# Patient Record
Sex: Female | Born: 1945 | Race: Black or African American | Hispanic: No | Smoking: Former smoker
Health system: Southern US, Community
[De-identification: ages and names within clinical notes are randomized; demographics above are authoritative.]

## PROBLEM LIST (undated history)

## (undated) DIAGNOSIS — Z9289 Personal history of other medical treatment: Secondary | ICD-10-CM

## (undated) DIAGNOSIS — I10 Essential (primary) hypertension: Secondary | ICD-10-CM

## (undated) DIAGNOSIS — Z860100 Personal history of colon polyps, unspecified: Secondary | ICD-10-CM

## (undated) DIAGNOSIS — E7800 Pure hypercholesterolemia, unspecified: Secondary | ICD-10-CM

## (undated) DIAGNOSIS — Z5189 Encounter for other specified aftercare: Secondary | ICD-10-CM

## (undated) DIAGNOSIS — N200 Calculus of kidney: Secondary | ICD-10-CM

## (undated) DIAGNOSIS — H269 Unspecified cataract: Secondary | ICD-10-CM

## (undated) DIAGNOSIS — T8859XA Other complications of anesthesia, initial encounter: Secondary | ICD-10-CM

## (undated) DIAGNOSIS — I499 Cardiac arrhythmia, unspecified: Secondary | ICD-10-CM

## (undated) DIAGNOSIS — K269 Duodenal ulcer, unspecified as acute or chronic, without hemorrhage or perforation: Secondary | ICD-10-CM

## (undated) DIAGNOSIS — R011 Cardiac murmur, unspecified: Secondary | ICD-10-CM

## (undated) DIAGNOSIS — M810 Age-related osteoporosis without current pathological fracture: Secondary | ICD-10-CM

## (undated) DIAGNOSIS — Z87442 Personal history of urinary calculi: Secondary | ICD-10-CM

## (undated) DIAGNOSIS — K263 Acute duodenal ulcer without hemorrhage or perforation: Secondary | ICD-10-CM

## (undated) DIAGNOSIS — K921 Melena: Secondary | ICD-10-CM

## (undated) DIAGNOSIS — G5600 Carpal tunnel syndrome, unspecified upper limb: Secondary | ICD-10-CM

## (undated) HISTORY — DX: Unspecified cataract: H26.9

## (undated) HISTORY — DX: Encounter for other specified aftercare: Z51.89

## (undated) HISTORY — DX: Melena: K92.1

## (undated) HISTORY — DX: Cardiac arrhythmia, unspecified: I49.9

## (undated) HISTORY — DX: Personal history of colonic polyps: Z86.010

## (undated) HISTORY — DX: Carpal tunnel syndrome, unspecified upper limb: G56.00

## (undated) HISTORY — DX: Personal history of other medical treatment: Z92.89

## (undated) HISTORY — DX: Age-related osteoporosis without current pathological fracture: M81.0

## (undated) HISTORY — DX: Personal history of colon polyps, unspecified: Z86.0100

## (undated) HISTORY — DX: Calculus of kidney: N20.0

## (undated) HISTORY — DX: Cardiac murmur, unspecified: R01.1

## (undated) HISTORY — PX: LITHOTRIPSY: SUR834

---

## 1970-01-18 DIAGNOSIS — Z5189 Encounter for other specified aftercare: Secondary | ICD-10-CM

## 1970-01-18 HISTORY — DX: Encounter for other specified aftercare: Z51.89

## 1970-01-18 HISTORY — PX: ABDOMINAL HYSTERECTOMY: SHX81

## 2008-01-19 DIAGNOSIS — M810 Age-related osteoporosis without current pathological fracture: Secondary | ICD-10-CM

## 2008-01-19 HISTORY — DX: Age-related osteoporosis without current pathological fracture: M81.0

## 2010-01-18 DIAGNOSIS — Z9289 Personal history of other medical treatment: Secondary | ICD-10-CM

## 2010-01-18 HISTORY — DX: Personal history of other medical treatment: Z92.89

## 2011-07-06 DIAGNOSIS — Z79899 Other long term (current) drug therapy: Secondary | ICD-10-CM | POA: Diagnosis not present

## 2011-07-06 DIAGNOSIS — I10 Essential (primary) hypertension: Secondary | ICD-10-CM | POA: Diagnosis not present

## 2011-07-06 DIAGNOSIS — M810 Age-related osteoporosis without current pathological fracture: Secondary | ICD-10-CM | POA: Diagnosis not present

## 2011-07-06 DIAGNOSIS — E785 Hyperlipidemia, unspecified: Secondary | ICD-10-CM | POA: Diagnosis not present

## 2011-07-06 DIAGNOSIS — G5600 Carpal tunnel syndrome, unspecified upper limb: Secondary | ICD-10-CM | POA: Diagnosis not present

## 2011-07-06 DIAGNOSIS — Z0000 Encounter for general adult medical examination without abnormal findings: Secondary | ICD-10-CM | POA: Diagnosis not present

## 2011-07-26 DIAGNOSIS — D485 Neoplasm of uncertain behavior of skin: Secondary | ICD-10-CM | POA: Diagnosis not present

## 2012-04-05 ENCOUNTER — Encounter (HOSPITAL_BASED_OUTPATIENT_CLINIC_OR_DEPARTMENT_OTHER): Payer: Self-pay | Admitting: *Deleted

## 2012-04-05 ENCOUNTER — Emergency Department (HOSPITAL_BASED_OUTPATIENT_CLINIC_OR_DEPARTMENT_OTHER)
Admission: EM | Admit: 2012-04-05 | Discharge: 2012-04-05 | Disposition: A | Discharge status: Home / Self Care | Payer: Medicare Other | Attending: Emergency Medicine | Admitting: Emergency Medicine

## 2012-04-05 ENCOUNTER — Emergency Department (HOSPITAL_BASED_OUTPATIENT_CLINIC_OR_DEPARTMENT_OTHER): Payer: Medicare Other

## 2012-04-05 DIAGNOSIS — E7800 Pure hypercholesterolemia, unspecified: Secondary | ICD-10-CM | POA: Insufficient documentation

## 2012-04-05 DIAGNOSIS — Z79899 Other long term (current) drug therapy: Secondary | ICD-10-CM | POA: Diagnosis not present

## 2012-04-05 DIAGNOSIS — D72829 Elevated white blood cell count, unspecified: Secondary | ICD-10-CM

## 2012-04-05 DIAGNOSIS — R002 Palpitations: Secondary | ICD-10-CM

## 2012-04-05 DIAGNOSIS — Z7982 Long term (current) use of aspirin: Secondary | ICD-10-CM | POA: Insufficient documentation

## 2012-04-05 DIAGNOSIS — I10 Essential (primary) hypertension: Secondary | ICD-10-CM | POA: Insufficient documentation

## 2012-04-05 HISTORY — DX: Pure hypercholesterolemia, unspecified: E78.00

## 2012-04-05 HISTORY — DX: Essential (primary) hypertension: I10

## 2012-04-05 LAB — CBC WITH DIFFERENTIAL/PLATELET
Basophils Absolute: 0 10*3/uL (ref 0.0–0.1)
Basophils Relative: 0 % (ref 0–1)
Eosinophils Absolute: 0.2 10*3/uL (ref 0.0–0.7)
Hemoglobin: 14.2 g/dL (ref 12.0–15.0)
MCH: 29.5 pg (ref 26.0–34.0)
MCHC: 34 g/dL (ref 30.0–36.0)
Monocytes Relative: 6 % (ref 3–12)
Neutro Abs: 7 10*3/uL (ref 1.7–7.7)
Neutrophils Relative %: 58 % (ref 43–77)
Platelets: 272 10*3/uL (ref 150–400)
RDW: 13.9 % (ref 11.5–15.5)

## 2012-04-05 LAB — COMPREHENSIVE METABOLIC PANEL
ALT: 13 U/L (ref 0–35)
AST: 13 U/L (ref 0–37)
Albumin: 3.9 g/dL (ref 3.5–5.2)
Alkaline Phosphatase: 86 U/L (ref 39–117)
BUN: 18 mg/dL (ref 6–23)
Chloride: 101 mEq/L (ref 96–112)
Potassium: 3.8 mEq/L (ref 3.5–5.1)
Sodium: 142 mEq/L (ref 135–145)
Total Bilirubin: 0.2 mg/dL — ABNORMAL LOW (ref 0.3–1.2)
Total Protein: 7.5 g/dL (ref 6.0–8.3)

## 2012-04-05 LAB — LIPASE, BLOOD: Lipase: 35 U/L (ref 11–59)

## 2012-04-05 NOTE — ED Notes (Signed)
C/o palpitations since yesterday, denies CP. Pt states that the palpitations have been constant but denies SOB.

## 2012-04-05 NOTE — ED Provider Notes (Signed)
History     CSN: [id number]  Arrival date & time 04/05/12  1941   First MD Initiated Contact with Patient 04/05/12 2004      Chief Complaint  Patient presents with  . Palpitations    HPI  The patient presents with palpitations.  This episode began yesterday.  She notes similar prior episodes in the distant past, that resulted in cardiology evaluation with Holter monitoring.  She has no ecchymosis of dysrhythmia, nor MI.  She also has no evidence of thromboembolic disease. Since this episode began yesterday she's had mild anterior chest fluttering sensation without concurrent chest pain, dyspnea, lightheadedness, syncope, vomiting, weakness or any other focal complaints.  No clear alleviating or exacerbating factors.   Past Medical History  Diagnosis Date  . Hypertension   . High cholesterol     Past Surgical History  Procedure Laterality Date  . Abdominal hysterectomy      History reviewed. No pertinent family history.  History  Substance Use Topics  . Smoking status: Never Smoker   . Smokeless tobacco: Not on file  . Alcohol Use: No    OB History   Grav Para Term Preterm Abortions TAB SAB Ect Mult Living                  Review of Systems  Constitutional:       Per HPI, otherwise negative  HENT:       Per HPI, otherwise negative  Respiratory:       Per HPI, otherwise negative  Cardiovascular:       Per HPI, otherwise negative  Gastrointestinal: Negative for vomiting.  Endocrine:       Negative aside from HPI  Genitourinary:       Neg aside from HPI   Musculoskeletal:       Per HPI, otherwise negative  Skin: Negative.   Neurological: Negative for syncope.    Allergies  Review of patient's allergies indicates no known allergies.  Home Medications   Current Outpatient Rx  Name  Route  Sig  Dispense  Refill  . aspirin 81 MG tablet   Oral   Take 81 mg by mouth daily.         . cholecalciferol (VITAMIN D) 1000 UNITS tablet   Oral   Take 1,000  Units by mouth daily.         Marland Kitchen lisinopril (PRINIVIL,ZESTRIL) 5 MG tablet   Oral   Take 5 mg by mouth daily.         . simvastatin (ZOCOR) 5 MG tablet   Oral   Take 5 mg by mouth at bedtime.           BP 129/72  Pulse 87  Temp(Src) 98.9 F (37.2 C) (Oral)  Resp 20  Ht 4\' 11"  (1.499 m)  Wt 175 lb (79.379 kg)  BMI 35.33 kg/m2  SpO2 100%  Physical Exam  Nursing note and vitals reviewed. Constitutional: She is oriented to person, place, and time. She appears well-developed and well-nourished. No distress.  HENT:  Head: Normocephalic and atraumatic.  Eyes: Conjunctivae and EOM are normal.  Cardiovascular: Normal rate and regular rhythm.   Pulmonary/Chest: Effort normal and breath sounds normal. No stridor. No respiratory distress.  Abdominal: She exhibits no distension.  Musculoskeletal: She exhibits no edema.  Neurological: She is alert and oriented to person, place, and time. No cranial nerve deficit.  Skin: Skin is warm and dry.  Psychiatric: She has a normal mood and affect.  ED Course  Procedures (including critical care time)  Labs Reviewed  CBC WITH DIFFERENTIAL - Abnormal; Notable for the following:    WBC 12.1 (*)    Lymphs Abs 4.1 (*)    All other components within normal limits  COMPREHENSIVE METABOLIC PANEL  LIPASE, BLOOD  TROPONIN I   No results found.   No diagnosis found.   Cardiac 70 sinus rhythm normal O2-  99% room air normal   Date: 04/05/2012  Rate: 73  Rhythm: normal sinus rhythm  QRS Axis: left  Intervals: normal  ST/T Wave abnormalities: normal  Conduction Disutrbances:none  Narrative Interpretation:   Old EKG Reviewed: none available Anterior q- waves - borderline  10:28 PM Patient sitting upright smiling, speaking with her family.  She and her family were informed of all results, including leukocytosis, mild hypercalcemia.  The patient denies any ongoing complaints. MDM  This pleasant elderly female presents with  concern palpitations, painless, without dyspnea.  On exam she is in no distress, with no hypoxia, tachycardia, tachypnea.  Given the absence of pain, distress, the suspicion for atypical ACS.  There is mild leukocytosis, mild hypercalcemia, but absent distress, with largely reassuring labs, ECG, x-ray, the patient was appropriate for discharge with next a followup.  She assured me that she has a primary care physician with whom she will speak tomorrow.       Gerhard Munch, MD 04/05/12 2229

## 2012-04-05 NOTE — ED Notes (Signed)
States that her heart is fluttering. Denied pain, dyspnea, diaphoresis, N & V.

## 2012-04-06 DIAGNOSIS — R002 Palpitations: Secondary | ICD-10-CM | POA: Diagnosis not present

## 2012-04-28 DIAGNOSIS — R002 Palpitations: Secondary | ICD-10-CM | POA: Diagnosis not present

## 2012-08-08 ENCOUNTER — Encounter: Payer: Self-pay | Admitting: Family Medicine

## 2012-08-08 ENCOUNTER — Ambulatory Visit (INDEPENDENT_AMBULATORY_CARE_PROVIDER_SITE_OTHER): Payer: Medicare Other | Admitting: Family Medicine

## 2012-08-08 VITALS — BP 116/74 | HR 92 | Temp 98.1°F | Ht <= 58 in | Wt 173.5 lb

## 2012-08-08 DIAGNOSIS — I10 Essential (primary) hypertension: Secondary | ICD-10-CM | POA: Diagnosis not present

## 2012-08-08 DIAGNOSIS — E785 Hyperlipidemia, unspecified: Secondary | ICD-10-CM | POA: Diagnosis not present

## 2012-08-08 MED ORDER — LISINOPRIL-HYDROCHLOROTHIAZIDE 10-12.5 MG PO TABS: 1.0000 | ORAL_TABLET | Freq: Every day | ORAL | Status: DC

## 2012-08-08 NOTE — Patient Instructions (Addendum)
Schedule a lab visit on the way out.  We'll contact you with your lab report. We'll request your old records.  Don't change your meds.  I would get a flu shot each fall.

## 2012-08-09 ENCOUNTER — Encounter: Payer: Self-pay | Admitting: Family Medicine

## 2012-08-09 DIAGNOSIS — I10 Essential (primary) hypertension: Secondary | ICD-10-CM | POA: Insufficient documentation

## 2012-08-09 DIAGNOSIS — E785 Hyperlipidemia, unspecified: Secondary | ICD-10-CM | POA: Insufficient documentation

## 2012-08-09 NOTE — Assessment & Plan Note (Signed)
Continue meds, return for labs.  Requesting records.  

## 2012-08-09 NOTE — Progress Notes (Signed)
New patient.  Requesting records.    Hypertension:    Using medication without problems or lightheadedness: y Chest pain with exertion:n Edema:n Short of breath:n  Elevated Cholesterol: Using medications without problems:y Muscle aches: not other than occ nocturnal calf cramps, discussed stretching Diet compliance:discussed Exercise: discussed  Meds, vitals, and allergies reviewed.   PMH and SH reviewed  ROS: See HPI.  Otherwise negative.    GEN: nad, alert and oriented HEENT: mucous membranes moist NECK: supple w/o LA CV: rrr. PULM: ctab, no inc wob ABD: soft, +bs EXT: no edema SKIN: no acute rash

## 2012-08-09 NOTE — Assessment & Plan Note (Signed)
Continue meds, return for labs.  Requesting records.

## 2012-08-18 ENCOUNTER — Encounter: Payer: Self-pay | Admitting: Family Medicine

## 2012-08-18 ENCOUNTER — Telehealth: Payer: Self-pay | Admitting: Family Medicine

## 2012-08-18 DIAGNOSIS — M810 Age-related osteoporosis without current pathological fracture: Secondary | ICD-10-CM | POA: Insufficient documentation

## 2012-08-18 NOTE — Telephone Encounter (Signed)
Left message on answering machine at home and cell phone to call back.

## 2012-08-18 NOTE — Telephone Encounter (Signed)
Call pt.  Old records reviewed.   She appears to be due for mammogram and possibly repeat colonoscopy.  If she needs referrals, let me know.  Otherwise would get the prev ordered labs soon.  I would get a flu shot each fall.   Schedule CPE for spring 2015.  Thanks.

## 2012-08-21 ENCOUNTER — Other Ambulatory Visit (INDEPENDENT_AMBULATORY_CARE_PROVIDER_SITE_OTHER): Payer: Medicare Other

## 2012-08-21 DIAGNOSIS — I10 Essential (primary) hypertension: Secondary | ICD-10-CM | POA: Diagnosis not present

## 2012-08-21 LAB — BASIC METABOLIC PANEL
CO2: 26 mEq/L (ref 19–32)
Calcium: 9.1 mg/dL (ref 8.4–10.5)
Creatinine, Ser: 0.7 mg/dL (ref 0.4–1.2)
GFR: 112.62 mL/min (ref >60.00)
Glucose, Bld: 107 mg/dL — ABNORMAL HIGH (ref 70–99)
Sodium: 142 mEq/L (ref 135–145)

## 2012-08-21 LAB — LIPID PANEL
HDL: 44.1 mg/dL (ref >39.00)
Total CHOL/HDL Ratio: 4
Triglycerides: 128 mg/dL (ref 0.0–149.0)
VLDL: 25.6 mg/dL (ref 0.0–40.0)

## 2012-08-21 NOTE — Telephone Encounter (Signed)
Patient advised.   She will check on scheduling her mammogram and colonoscopy and will let us know if she needs referrals.  She had labs done today.  Will schedule CPE for Spring 2015.

## 2012-08-22 ENCOUNTER — Encounter: Payer: Self-pay | Admitting: Family Medicine

## 2012-08-22 LAB — TSH
ALT: 10 U/L (ref 7–35)
AST: 12 U/L
BUN: 12 mg/dL (ref 4–21)
Cholesterol: 200 mg/dL (ref 0–200)
Glucose: 88
Hemoglobin: 13.4 g/dL (ref 11.8–15.5)

## 2012-08-23 ENCOUNTER — Other Ambulatory Visit: Payer: Medicare Other

## 2012-08-29 DIAGNOSIS — H10029 Other mucopurulent conjunctivitis, unspecified eye: Secondary | ICD-10-CM | POA: Diagnosis not present

## 2012-08-31 ENCOUNTER — Encounter: Payer: Self-pay | Admitting: Family Medicine

## 2012-08-31 ENCOUNTER — Ambulatory Visit (INDEPENDENT_AMBULATORY_CARE_PROVIDER_SITE_OTHER): Payer: Medicare Other | Admitting: Family Medicine

## 2012-08-31 VITALS — BP 132/68 | HR 89 | Temp 98.2°F | Wt 174.0 lb

## 2012-08-31 DIAGNOSIS — M79609 Pain in unspecified limb: Secondary | ICD-10-CM | POA: Diagnosis not present

## 2012-08-31 DIAGNOSIS — M79605 Pain in left leg: Secondary | ICD-10-CM

## 2012-08-31 DIAGNOSIS — M25569 Pain in unspecified knee: Secondary | ICD-10-CM | POA: Insufficient documentation

## 2012-08-31 MED ORDER — LISINOPRIL-HYDROCHLOROTHIAZIDE 10-12.5 MG PO TABS: 1.0000 | ORAL_TABLET | Freq: Every day | ORAL | Status: DC

## 2012-08-31 NOTE — Assessment & Plan Note (Signed)
Likely hamstring strain that is positional.  Would change sitting position and use a knee sleeve.  Fu prn. No sign of dvt.

## 2012-08-31 NOTE — Progress Notes (Signed)
No back pain but L posterior leg pain, near the distal hamstring.  No calf pain.  Worse recently, no trauma.  No rash.  No knee locking. She often sits with her knee flexed and her L calf under her R thigh, ie L leg pinned under R leg.   Meds, vitals, and allergies reviewed.   ROS: See HPI.  Otherwise, noncontributory.  nad ncat Back w/o midline pain SLR neg but hamstring pain reproduced on stretching.  No rash No bands in the legs, no calf circ discrepancy.  Distally nv intact Pain reproduced with a L leg lunge.

## 2012-08-31 NOTE — Patient Instructions (Addendum)
Stretch the back of your leg and use a knee sleeve.  Don't pin your leg back when you are sitting down.  Take care.

## 2013-01-04 ENCOUNTER — Telehealth: Payer: Self-pay | Admitting: Family Medicine

## 2013-01-04 NOTE — Telephone Encounter (Signed)
Left detailed message on voicemail.  

## 2013-01-04 NOTE — Telephone Encounter (Signed)
She could try mucinex tonight and then keep the appointment tomorrow.  It looks like she is scheduled with Dr. Felicity Coyer.

## 2013-01-04 NOTE — Telephone Encounter (Signed)
Patient Information:  Caller Name: Adriahna  Phone: [phone]  Patient: Patricia Hoover, Patricia Hoover  Gender: Female  DOB: January 09, 1946  Age: 67 Years  PCP: Crawford Givens Clelia Croft) [facility])  Office Follow Up:  Does the office need to follow up with this patient?: No  Instructions For The Office: N/A  RN Note:  Onset of cough after cold symptoms 01/01/13.  States cough has gotten worse, and wants to know next steps, or if she can take OTC cough medication.  States constant cough is disruptive to sleep. Per cough protocol, emergent symptoms denied; advised appt within 24 hours.  Appts not available in Epic at [facility] within dispositioned time frame; appt scheduled 01/05/13 1415 with Dr. Felicity Coyer.  krs/can  Symptoms  Reason For Call & Symptoms: cough  Reviewed Health History In EMR: Yes  Reviewed Medications In EMR: Yes  Reviewed Allergies In EMR: Yes  Reviewed Surgeries / Procedures: Yes  Date of Onset of Symptoms: 01/01/2013  Guideline(s) Used:  Cough  Disposition Per Guideline:   See Today or Tomorrow in Office  Reason For Disposition Reached:   Continuous (nonstop) coughing interferes with work or school and no improvement using cough treatment per Care Advice  Advice Given:  N/A  Patient Will Follow Care Advice:  YES  Appointment Scheduled:  01/05/2013 14:15:00 Appointment Scheduled Provider:  N/A

## 2013-01-16 ENCOUNTER — Ambulatory Visit (INDEPENDENT_AMBULATORY_CARE_PROVIDER_SITE_OTHER): Payer: Medicare Other | Admitting: Family Medicine

## 2013-01-16 ENCOUNTER — Encounter: Payer: Self-pay | Admitting: Family Medicine

## 2013-01-16 VITALS — BP 138/66 | HR 97 | Temp 100.0°F | Wt 171.0 lb

## 2013-01-16 DIAGNOSIS — J209 Acute bronchitis, unspecified: Secondary | ICD-10-CM | POA: Insufficient documentation

## 2013-01-16 MED ORDER — AZITHROMYCIN 250 MG PO TABS: ORAL_TABLET | ORAL | Status: DC

## 2013-01-16 NOTE — Assessment & Plan Note (Signed)
Nontoxic, but with sx>1 week and rhonchi would start zmax.  Use OTC cough meds and f/u prn.  Okay for outpatient f/u.  She agrees.  Call back as needed.

## 2013-01-16 NOTE — Progress Notes (Signed)
Pre-visit discussion using our clinic review tool. No additional management support is needed unless otherwise documented below in the visit note.  H/o mult illnesses, back to back. Got sick initially in early 12/2012.  Most recent episode started about 1 week ago. Cough, congestion. Taking coricidin and vit C/halls/chloraseptic.  Fevers likely, 100 today.  Some chest aches with the cough, not aching o/w.  No vomiting, diarrhea, rash.  No ear pain.    Meds, vitals, and allergies reviewed.   ROS: See HPI.  Otherwise, noncontributory.  GEN: nad, alert and oriented HEENT: mucous membranes moist, tm w/o erythema, nasal exam w/o erythema, clear discharge noted,  OP with cobblestoning NECK: supple w/o LA CV: rrr.   PULM: ctab except for scattered rhonchi, no inc wob, speaking in complete sentences. EXT: no edema SKIN: no acute rash

## 2013-01-16 NOTE — Patient Instructions (Signed)
Take zithromax for likely bronchitis. Take the OTC cough meds and try to get some rest.  Drink plenty of fluids.  Take care.

## 2013-02-19 ENCOUNTER — Ambulatory Visit (INDEPENDENT_AMBULATORY_CARE_PROVIDER_SITE_OTHER): Payer: Medicare Other | Admitting: Family Medicine

## 2013-02-19 ENCOUNTER — Encounter: Payer: Self-pay | Admitting: Family Medicine

## 2013-02-19 VITALS — BP 122/78 | HR 76 | Temp 98.6°F | Wt 172.8 lb

## 2013-02-19 DIAGNOSIS — I10 Essential (primary) hypertension: Secondary | ICD-10-CM

## 2013-02-19 DIAGNOSIS — M7710 Lateral epicondylitis, unspecified elbow: Secondary | ICD-10-CM | POA: Diagnosis not present

## 2013-02-19 NOTE — Patient Instructions (Signed)
Get a tennis elbow strap and use the exercises. Ice your elbow as needed. Take care.

## 2013-02-19 NOTE — Assessment & Plan Note (Addendum)
She had no palpitation on exam during the event. This appears to be a sensation but not a concerning, intrathoracic event. Fu prn. She agrees.

## 2013-02-19 NOTE — Assessment & Plan Note (Signed)
Use an OTC strap, handout given with exercises.  Fu prn.

## 2013-02-19 NOTE — Progress Notes (Signed)
Pre-visit discussion using our clinic review tool. No additional management support is needed unless otherwise documented below in the visit note.  She had a reproducible anterior chest pain with a tight bra strap over the last 6 months.  It got better when she wasn't wearing a bra.  She has no exertional sx.  Not SOB.  She has occ sensation of palpitations, prev with neg monitoring per her report.  She had an episode of palpitation-type sensation in the clinic and I examined her during the event.  Normal pulse and no tachycardia during the event.    She has no L arm pain.  R arm pain at the elbow, started after picking up a big pot of greens a few months ago.  Pain with ROM afterward.  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    Meds, vitals, and allergies reviewed.   ROS: See HPI.  Otherwise, noncontributory.  GEN: nad, alert and oriented HEENT: mucous membranes moist NECK: supple w/o LA CV: rrr.  PULM: ctab, no inc wob ABD: soft, +bs EXT: no edema SKIN: no acute rash R tennis elbow- pain distal to elbow near lateral epicondyle on extensor surface with resisted ROM, better with compression.

## 2013-02-20 ENCOUNTER — Telehealth: Payer: Self-pay | Admitting: Family Medicine

## 2013-02-20 NOTE — Telephone Encounter (Signed)
Relevant patient education assigned to patient using Emmi. ° °

## 2013-03-16 ENCOUNTER — Other Ambulatory Visit: Payer: Self-pay

## 2013-03-16 MED ORDER — SIMVASTATIN 10 MG PO TABS: 10.0000 mg | ORAL_TABLET | ORAL | Status: DC

## 2013-03-16 NOTE — Telephone Encounter (Signed)
Pt request refill on simvastatin 10 mg taking one tab every other [facility]. Previously prescribed by another doctor but request Dr Damita Dunnings fill now.

## 2013-03-16 NOTE — Telephone Encounter (Signed)
Sent. Thanks.   

## 2013-04-11 ENCOUNTER — Other Ambulatory Visit: Payer: Self-pay

## 2013-04-11 MED ORDER — IBUPROFEN 800 MG PO TABS: 800.0000 mg | ORAL_TABLET | Freq: Three times a day (TID) | ORAL | Status: DC | PRN

## 2013-04-11 NOTE — Telephone Encounter (Signed)
Sent. Thanks.   

## 2013-04-11 NOTE — Telephone Encounter (Signed)
Pt [facility], pt only wants # 30; pt said last filled by Dr Benjamine Mola for general aches and pain.Please advise.

## 2013-04-13 ENCOUNTER — Encounter: Payer: Self-pay | Admitting: Family Medicine

## 2013-04-13 ENCOUNTER — Telehealth: Payer: Self-pay

## 2013-04-13 ENCOUNTER — Ambulatory Visit (INDEPENDENT_AMBULATORY_CARE_PROVIDER_SITE_OTHER): Payer: Medicare Other | Admitting: Family Medicine

## 2013-04-13 VITALS — BP 142/80 | HR 79 | Temp 98.0°F | Wt 172.5 lb

## 2013-04-13 DIAGNOSIS — R071 Chest pain on breathing: Secondary | ICD-10-CM | POA: Diagnosis not present

## 2013-04-13 DIAGNOSIS — R0789 Other chest pain: Secondary | ICD-10-CM | POA: Diagnosis not present

## 2013-04-13 NOTE — Assessment & Plan Note (Signed)
Reproduced, EKG w/o acute changes.  Likely MSK source.  Relative rest, f/u prn.  Gradual return to exercise.  Anatomy d/w pt.  She agrees. Not at all likely to be intrathoracic.

## 2013-04-13 NOTE — Telephone Encounter (Signed)
Patient advised.

## 2013-04-13 NOTE — Telephone Encounter (Signed)
Ibuprofen with food TID.  Use as little as possible.  Thanks.

## 2013-04-13 NOTE — Progress Notes (Signed)
Pre visit review using our clinic review tool, if applicable. No additional management support is needed unless otherwise documented below in the visit note.  Pressure in her chest.  Going on intermittently for about 1 month.  Sometimes she'll have no sx.  It can last for hours.  She can have upper back and neck discomfort and ear pressure.  Not SOB.  [facility] [employer] [facility] and [facility], w/o limitation.  It can happen at rest.  No syncope.  No cough, no sputum.  No nausea.  Not taking ibuprofen recently.  No vomiting, no blood in stools. Occ diarrhea recently, limited.  H/o normal stress test prev.  She has been working out and lifting, may have pulled a muscle.    Meds, vitals, and allergies reviewed.   ROS: See HPI.  Otherwise, noncontributory.  nad ncat Mmm rrr ctab abd soft Sternum is tender, similar to the above. Pain reproduceable with twisting, like was doing on the oblique/abd machine [facility] [employer] [facility] w/o edema  EKG w/o acute changes.

## 2013-04-13 NOTE — Patient Instructions (Signed)
This looks like a pulled muscle. I would avoid that exercise at the gym and you should get better.  Take care.

## 2013-04-13 NOTE — Telephone Encounter (Signed)
Pt left v/m; pt was seen today and pt forgot to ask Dr Damita Dunnings what medication pt could get to help chest pressure and upper back and neck discomfort.[facility].Please advise. Pt request cb.

## 2013-05-24 ENCOUNTER — Other Ambulatory Visit: Payer: Self-pay | Admitting: Family Medicine

## 2013-05-24 DIAGNOSIS — I10 Essential (primary) hypertension: Secondary | ICD-10-CM

## 2013-05-24 DIAGNOSIS — M810 Age-related osteoporosis without current pathological fracture: Secondary | ICD-10-CM

## 2013-05-28 ENCOUNTER — Other Ambulatory Visit (INDEPENDENT_AMBULATORY_CARE_PROVIDER_SITE_OTHER): Payer: Medicare Other

## 2013-05-28 ENCOUNTER — Other Ambulatory Visit: Payer: Medicare Other

## 2013-05-28 DIAGNOSIS — I10 Essential (primary) hypertension: Secondary | ICD-10-CM

## 2013-05-28 DIAGNOSIS — M810 Age-related osteoporosis without current pathological fracture: Secondary | ICD-10-CM

## 2013-05-28 DIAGNOSIS — E785 Hyperlipidemia, unspecified: Secondary | ICD-10-CM

## 2013-05-28 LAB — COMPREHENSIVE METABOLIC PANEL
ALBUMIN: 3.7 g/dL (ref 3.5–5.2)
ALT: 13 U/L (ref 0–35)
AST: 14 U/L (ref 0–37)
Alkaline Phosphatase: 59 U/L (ref 39–117)
BUN: 14 mg/dL (ref 6–23)
CALCIUM: 8.9 mg/dL (ref 8.4–10.5)
CHLORIDE: 104 meq/L (ref 96–112)
CO2: 31 mEq/L (ref 19–32)
Creatinine, Ser: 0.6 mg/dL (ref 0.4–1.2)
GFR: 122.88 mL/min (ref >60.00)
Glucose, Bld: 118 mg/dL — ABNORMAL HIGH (ref 70–99)
POTASSIUM: 3.5 meq/L (ref 3.5–5.1)
Sodium: 141 mEq/L (ref 135–145)
Total Bilirubin: 0.7 mg/dL (ref 0.2–1.2)
Total Protein: 7 g/dL (ref 6.0–8.3)

## 2013-05-28 LAB — LIPID PANEL
CHOL/HDL RATIO: 4
Cholesterol: 177 mg/dL (ref 0–200)
HDL: 45.1 mg/dL (ref >39.00)
LDL CALC: 109 mg/dL — AB (ref 0–99)
Triglycerides: 114 mg/dL (ref 0.0–149.0)
VLDL: 22.8 mg/dL (ref 0.0–40.0)

## 2013-05-29 LAB — VITAMIN D 25 HYDROXY (VIT D DEFICIENCY, FRACTURES): Vit D, 25-Hydroxy: 38 ng/mL (ref 30–89)

## 2013-06-04 ENCOUNTER — Encounter: Payer: Self-pay | Admitting: Family Medicine

## 2013-06-04 ENCOUNTER — Encounter: Payer: Self-pay | Admitting: Internal Medicine

## 2013-06-04 ENCOUNTER — Ambulatory Visit (INDEPENDENT_AMBULATORY_CARE_PROVIDER_SITE_OTHER): Payer: Medicare Other | Admitting: Family Medicine

## 2013-06-04 VITALS — BP 128/70 | HR 98 | Temp 98.6°F | Ht <= 58 in | Wt 172.0 lb

## 2013-06-04 DIAGNOSIS — R7309 Other abnormal glucose: Secondary | ICD-10-CM

## 2013-06-04 DIAGNOSIS — E785 Hyperlipidemia, unspecified: Secondary | ICD-10-CM

## 2013-06-04 DIAGNOSIS — R739 Hyperglycemia, unspecified: Secondary | ICD-10-CM

## 2013-06-04 DIAGNOSIS — Z0000 Encounter for general adult medical examination without abnormal findings: Secondary | ICD-10-CM

## 2013-06-04 DIAGNOSIS — Z23 Encounter for immunization: Secondary | ICD-10-CM | POA: Diagnosis not present

## 2013-06-04 DIAGNOSIS — Z1211 Encounter for screening for malignant neoplasm of colon: Secondary | ICD-10-CM

## 2013-06-04 DIAGNOSIS — I10 Essential (primary) hypertension: Secondary | ICD-10-CM

## 2013-06-04 DIAGNOSIS — M810 Age-related osteoporosis without current pathological fracture: Secondary | ICD-10-CM | POA: Diagnosis not present

## 2013-06-04 NOTE — Progress Notes (Signed)
Pre visit review using our clinic review tool, if applicable. No additional management support is needed unless otherwise documented below in the visit note.  I have personally reviewed the Medicare Annual Wellness questionnaire and have noted 1. The patient's medical and social history 2. Their use of alcohol, tobacco or illicit drugs 3. Their current medications and supplements 4. The patient's functional ability including ADL's, fall risks, home safety risks and hearing or visual             impairment. 5. Diet and physical activities 6. Evidence for depression or mood disorders  The patients weight, height, BMI have been recorded in the chart and visual acuity is per eye clinic.  I have made referrals, counseling and provided education to the patient based review of the above and I have provided the pt with a written personalized care plan for preventive services.  See scanned forms.  Routine anticipatory guidance given to patient.  See health maintenance. Flu encouraged.  Shingles 2013 PNA today.  Tetanus 2013 Colonoscopy due.  Referred.  Breast cancer screening- due.  She'll call for mammogram. Cognitive function addressed- see scanned forms- and if abnormal then additional documentation follows.   Hypertension:    Using medication without problems or lightheadedness: yes Chest pain with exertion:no Edema:no Short of breath:no  Elevated Cholesterol: Using medications without problems:yes Muscle aches: no Diet compliance:yes Exercise:yes  Elevated sugar, labs d/w pt.  Diet discussed, handout given.   Osteoporosis.  Due for f/u DXA.  D/w pt.  Ordered.  Intol of bisphosphonate tx prev.    PMH and SH reviewed  Meds, vitals, and allergies reviewed.   ROS: See HPI.  Otherwise negative.    GEN: nad, alert and oriented HEENT: mucous membranes moist NECK: supple w/o LA CV: rrr. PULM: ctab, no inc wob ABD: soft, +bs EXT: no edema SKIN: no acute rash

## 2013-06-04 NOTE — Patient Instructions (Addendum)
Rosaria Ferries will call about your referrals (bone density and colonoscopy). Call about a mammogram and an eye exam.   I would get a flu shot each fall.   Take care.  Glad to see you today.  Keep exercising and use the low carb diet.

## 2013-06-05 DIAGNOSIS — Z0000 Encounter for general adult medical examination without abnormal findings: Secondary | ICD-10-CM | POA: Insufficient documentation

## 2013-06-05 DIAGNOSIS — R739 Hyperglycemia, unspecified: Secondary | ICD-10-CM | POA: Insufficient documentation

## 2013-06-05 NOTE — Assessment & Plan Note (Signed)
Controlled, continue as is.  D/w pt.  

## 2013-06-05 NOTE — Assessment & Plan Note (Signed)
Due for f/u DXA.  D/w pt.  Ordered.  Intol of bisphosphonate tx prev.

## 2013-06-05 NOTE — Assessment & Plan Note (Signed)
See scanned forms. Routine anticipatory guidance given to patient. See health maintenance.  Flu encouraged.  Shingles 2013  PNA today.  Tetanus 2013  Colonoscopy due. Referred.  Breast cancer screening- due. She'll call for mammogram.  Cognitive function addressed- see scanned forms- and if abnormal then additional documentation follows.

## 2013-06-05 NOTE — Assessment & Plan Note (Signed)
Elevated sugar, labs d/w pt.  Diet discussed, handout given.  Needs weight loss.

## 2013-06-05 NOTE — Assessment & Plan Note (Addendum)
Reasonably controlled, continue as is.  D/w pt.

## 2013-06-07 ENCOUNTER — Other Ambulatory Visit: Payer: Self-pay

## 2013-06-07 ENCOUNTER — Telehealth: Payer: Self-pay | Admitting: Family Medicine

## 2013-06-07 NOTE — Telephone Encounter (Signed)
Already ordered

## 2013-06-07 NOTE — Telephone Encounter (Signed)
Patient called and her last Bone Density was 2 years ago at [place]. Please order her Bone Density she says she is now due. She is trying to get a copy faxed over to you.

## 2013-06-12 ENCOUNTER — Other Ambulatory Visit: Payer: Self-pay | Admitting: Family Medicine

## 2013-06-12 DIAGNOSIS — Z1231 Encounter for screening mammogram for malignant neoplasm of breast: Secondary | ICD-10-CM

## 2013-06-19 ENCOUNTER — Ambulatory Visit (INDEPENDENT_AMBULATORY_CARE_PROVIDER_SITE_OTHER)
Admission: RE | Admit: 2013-06-19 | Discharge: 2013-06-19 | Disposition: A | Discharge status: Home / Self Care | Payer: Medicare Other | Source: Ambulatory Visit | Attending: Family Medicine | Admitting: Family Medicine

## 2013-06-19 DIAGNOSIS — M810 Age-related osteoporosis without current pathological fracture: Secondary | ICD-10-CM | POA: Diagnosis not present

## 2013-06-20 ENCOUNTER — Telehealth: Payer: Self-pay | Admitting: Family Medicine

## 2013-06-20 NOTE — Telephone Encounter (Signed)
Call pt.  DXA with osteoporosis noted. She is intolerant of bisphosphonates.  The best option would likely be prolia.  If she wants to come in and talk about it, then please schedule her.  Thanks .

## 2013-06-21 NOTE — Telephone Encounter (Signed)
Left message on patient's voicemail to return call

## 2013-06-21 NOTE — Telephone Encounter (Signed)
Then I wouldn't intervene other than continue the vit D.  Thanks.

## 2013-06-21 NOTE — Telephone Encounter (Signed)
Patient says she has already tried the Prolia also and all of it gives her indigestion.

## 2013-06-26 ENCOUNTER — Ambulatory Visit
Admission: RE | Admit: 2013-06-26 | Discharge: 2013-06-26 | Disposition: A | Discharge status: Home / Self Care | Payer: Medicare Other | Source: Ambulatory Visit | Attending: Family Medicine | Admitting: Family Medicine

## 2013-06-26 DIAGNOSIS — Z1231 Encounter for screening mammogram for malignant neoplasm of breast: Secondary | ICD-10-CM | POA: Diagnosis not present

## 2013-06-29 ENCOUNTER — Encounter: Payer: Self-pay | Admitting: Family Medicine

## 2013-07-18 ENCOUNTER — Ambulatory Visit (AMBULATORY_SURGERY_CENTER): Payer: Self-pay | Admitting: *Deleted

## 2013-07-18 ENCOUNTER — Telehealth: Payer: Self-pay | Admitting: *Deleted

## 2013-07-18 VITALS — Ht <= 58 in | Wt 170.6 lb

## 2013-07-18 DIAGNOSIS — Z86010 Personal history of colonic polyps: Secondary | ICD-10-CM

## 2013-07-18 MED ORDER — MOVIPREP 100 G PO SOLR: ORAL | Status: DC

## 2013-07-18 NOTE — Telephone Encounter (Signed)
Dr Norman Herrlich: pt is scheduled for direct colonoscopy Thursday 7/9.  Previous colonoscopy 2005 and 2009 in [place].  Both reports are in EPIC under Media tab and are labeled "Pathology"  No pathology reports available.  Please review reports.  Is pt due no?  Pt is aware that procedure could be cancelled if you need pathology reports to make decision as to whether pt is due for colon now.  Thanks, Juliann Pulse

## 2013-07-18 NOTE — Progress Notes (Signed)
No allergies to eggs or soy. No problems with anesthesia.  Pt given Emmi instructions for colonoscopy  No oxygen use  No diet drug use  

## 2013-07-23 NOTE — Telephone Encounter (Signed)
Called patient home and cell, no answer. Left message, notified patient okay for colonoscopy as scheduled no change in appointment.

## 2013-07-23 NOTE — Telephone Encounter (Signed)
Hx of polyps with last colonoscopy in 2009, okay for repeat screening now

## 2013-07-26 ENCOUNTER — Encounter: Payer: Medicare Other | Admitting: Internal Medicine

## 2013-08-20 ENCOUNTER — Other Ambulatory Visit: Payer: Medicare Other

## 2013-08-21 ENCOUNTER — Inpatient Hospital Stay: Admission: RE | Admit: 2013-08-21 | Payer: Medicare Other | Source: Ambulatory Visit

## 2013-10-05 ENCOUNTER — Other Ambulatory Visit: Payer: Self-pay

## 2013-10-05 MED ORDER — LISINOPRIL-HYDROCHLOROTHIAZIDE 10-12.5 MG PO TABS: 1.0000 | ORAL_TABLET | Freq: Every day | ORAL | Status: DC

## 2013-10-05 NOTE — Telephone Encounter (Signed)
Pt left v/m requesting refill lisinopril hctz 10-[facility]. Advised done.

## 2013-10-30 DIAGNOSIS — Z23 Encounter for immunization: Secondary | ICD-10-CM | POA: Diagnosis not present

## 2014-01-28 ENCOUNTER — Telehealth: Payer: Self-pay | Admitting: Family Medicine

## 2014-01-28 NOTE — Telephone Encounter (Signed)
PLEASE NOTE: All timestamps contained within this report are represented as [place] Standard Time. CONFIDENTIALTY NOTICE: This fax transmission is intended only for the addressee. It contains information that is legally privileged, confidential or otherwise protected from use or disclosure. If you are not the intended recipient, you are strictly prohibited from reviewing, disclosing, copying using or disseminating any of this information or taking any action in reliance on or regarding this information. If you have received this fax in error, please notify us immediately by telephone so that we can arrange for its return to [place]. Phone: [phone], Toll-Free: [phone], Fax: [phone] Page: 1 of 2 Call Id: [id number] [place] Patient Name: Patricia Hoover Gender: Female DOB: [birth year] Age: 69 Y 3 M 17 D Return Phone Number: [phone] (Primary), [phone] (Secondary) Address: [address] City/State/Zip: [place] [address] Client [facility] - Client Client Site [place] - Day Physician Renford Dills Contact Type Call Call Type Triage / Clinical Relationship To Patient Self Appointment Disposition EMR Appointment Scheduled Return Phone Number [phone] (Primary) Chief Complaint CHEST PAIN (>=21 years) - pain, pressure, heaviness or tightness Initial Comment Caller states she feels chest pressure, out of breath. PreDisposition Call Doctor Nurse Assessment Nurse: Donalynn Furlong, RN, Myna Hidalgo Date/Time Eilene Ghazi Time): 01/28/2014 11:47:24 AM Confirm and document reason for call. If symptomatic, describe symptoms. ---Caller states she feels chest pressure, out of breath. States this has been for the last month, and is constant . the pain does not radiate anywhere. No shortness of breath,. Is generally is with exertion, ie. dancing, stairs  etc Has the patient traveled out of the country within the last 30 days? ---No Does the patient require triage? ---Yes Related visit to physician within the last 2 weeks? ---No Does the PT have any chronic conditions? (i.e. diabetes, asthma, etc.) ---Yes List chronic conditions. ---high blood pressure and cholesterol, takes meds for both Guidelines Guideline Title Affirmed Question Affirmed Notes Nurse Date/Time Eilene Ghazi Time) Chest Pain [1] Chest pain(s) lasting a few seconds AND [2] persists > 3 days Donalynn Furlong, RN, Myna Hidalgo 01/28/2014 11:53:49 AM Disp. Time Eilene Ghazi Time) Disposition Final User 01/28/2014 11:44:32 AM Send to Urgent Dione Housekeeper 01/28/2014 12:03:12 PM Send To RN Personal Donalynn Furlong, RN, Myna Hidalgo 01/28/2014 12:43:29 PM Call Completed Donalynn Furlong, RN, Myna Hidalgo 01/28/2014 12:00:39 PM See PCP When Office is Open (within 3 days) Yes Donalynn Furlong, RN, Myna Hidalgo PLEASE NOTE: All timestamps contained within this report are represented as [place] Standard Time. CONFIDENTIALTY NOTICE: This fax transmission is intended only for the addressee. It contains information that is legally privileged, confidential or otherwise protected from use or disclosure. If you are not the intended recipient, you are strictly prohibited from reviewing, disclosing, copying using or disseminating any of this information or taking any action in reliance on or regarding this information. If you have received this fax in error, please notify us immediately by telephone so that we can arrange for its return to [place]. Phone: [phone], Toll-Free: [phone], Fax: [phone] Page: 2 of 2 Call Id: [id number] Caller Understands: Yes Disagree/Comply: Comply Care Advice Given Per Guideline SEE PCP WITHIN 3 DAYS: You need to be examined within 2 or 3 days. Call your doctor during regular office hours and make an appointment. (Note: if office will be open tomorrow, tell caller to call then, not in 3 days). PAIN  MEDICINES: * For pain relief, take acetaminophen,  ibuprofen, or naproxen. ACETAMINOPHEN (E.G., TYLENOL): * Take 650 mg (two 325 mg pills) by mouth every 4-6 hours as needed. Each Regular Strength Tylenol pill has 325 mg of acetaminophen. The most you should take each day is 3,250 mg (10 Regular Strength pills a day). IBUPROFEN (E.G., MOTRIN, ADVIL): * Take 400 mg (two 200 mg pills) by mouth every 6 hours as needed. CALL BACK IF: * Chest pain increases in frequency, duration or severity * Chest pain lasts over 5 minutes * Difficulty breathing or unusual sweating occurs * Fever over 100.5 F (38.1 C) * You become worse. CARE ADVICE given per Chest Pain (Adult) guideline. After Care Instructions Given Call Event Type User Date / Time Description Comments User: Gennie Alma, RN Date/Time Eilene Ghazi Time): 01/28/2014 12:02:49 PM Pt states she also has had a recent change in her blood pressure medicines, and feels" worse", as if they are " not controlling my pressure well". This feeling of chest pressure with exertion has coincided with this change. User: Gennie Alma, RN Date/Time Eilene Ghazi Time): 01/28/2014 12:43:19 PM tc back to pt. Left message re: upcoming appt time of 01/28/2014 with Dr Damita Dunnings at 3:30 pm that has been made

## 2014-01-28 NOTE — Telephone Encounter (Signed)
Per note, going on for the last month, will see at [place] on 01/31/14.

## 2014-01-28 NOTE — Telephone Encounter (Signed)
Nurse Assessment  Nurse: Donalynn Furlong, RN, Myna Hidalgo Date/Time Eilene Ghazi Time): 01/28/2014 11:47:24 AM  Confirm and document reason for call. If symptomatic, describe symptoms. ---Caller states she feels chest pressure, out of breath. States this has been for the last month, and is constant . the pain does not radiate anywhere. No shortness of breath,. Is generally is with exertion, ie. dancing, stairs etc  Has the patient traveled out of the country within the last 30 days? ---No  Does the patient require triage? ---Yes  Related visit to physician within the last 2 weeks? ---No  Does the PT have any chronic conditions? (i.e. diabetes, asthma, etc.) ---Yes  List chronic conditions. ---high blood pressure and cholesterol, takes meds for both     Guidelines    Guideline Title Affirmed Question Affirmed Notes  Chest Pain [1] Chest pain(s) lasting a few seconds AND [2] persists > 3 days    Final Disposition User   See PCP When Office is Open (within 3 days) Donalynn Furlong, RN, Myna Hidalgo    Comments  Pt states she also has had a recent change in her blood pressure medicines, and feels" worse", as if they are " not controlling my pressure well". This feeling of chest pressure with exertion has coincided with this change.

## 2014-01-31 ENCOUNTER — Ambulatory Visit (INDEPENDENT_AMBULATORY_CARE_PROVIDER_SITE_OTHER): Payer: Medicare Other | Admitting: Family Medicine

## 2014-01-31 ENCOUNTER — Encounter: Payer: Self-pay | Admitting: Family Medicine

## 2014-01-31 VITALS — BP 102/62 | HR 94 | Temp 98.9°F | Wt 169.0 lb

## 2014-01-31 DIAGNOSIS — R002 Palpitations: Secondary | ICD-10-CM | POA: Diagnosis not present

## 2014-01-31 DIAGNOSIS — R0789 Other chest pain: Secondary | ICD-10-CM | POA: Diagnosis not present

## 2014-01-31 DIAGNOSIS — R0602 Shortness of breath: Secondary | ICD-10-CM

## 2014-01-31 LAB — COMPREHENSIVE METABOLIC PANEL
ALT: 8 U/L (ref 0–35)
AST: 11 U/L (ref 0–37)
Albumin: 4 g/dL (ref 3.5–5.2)
Alkaline Phosphatase: 79 U/L (ref 39–117)
BUN: 20 mg/dL (ref 6–23)
CHLORIDE: 101 meq/L (ref 96–112)
CO2: 30 meq/L (ref 19–32)
Calcium: 9.4 mg/dL (ref 8.4–10.5)
Creat: 0.68 mg/dL (ref 0.50–1.10)
GLUCOSE: 108 mg/dL — AB (ref 70–99)
POTASSIUM: 3.5 meq/L (ref 3.5–5.3)
SODIUM: 140 meq/L (ref 135–145)
Total Bilirubin: 0.3 mg/dL (ref 0.2–1.2)
Total Protein: 6.7 g/dL (ref 6.0–8.3)

## 2014-01-31 LAB — CBC WITH DIFFERENTIAL/PLATELET
Basophils Absolute: 0 10*3/uL (ref 0.0–0.1)
Basophils Relative: 0 % (ref 0–1)
Eosinophils Absolute: 0.1 10*3/uL (ref 0.0–0.7)
Eosinophils Relative: 1 % (ref 0–5)
HEMATOCRIT: 41.2 % (ref 36.0–46.0)
HEMOGLOBIN: 13.6 g/dL (ref 12.0–15.0)
LYMPHS PCT: 32 % (ref 12–46)
Lymphs Abs: 3.9 10*3/uL (ref 0.7–4.0)
MCH: 28.8 pg (ref 26.0–34.0)
MCHC: 33 g/dL (ref 30.0–36.0)
MCV: 87.1 fL (ref 78.0–100.0)
MONO ABS: 0.9 10*3/uL (ref 0.1–1.0)
MPV: 11.1 fL (ref 8.6–12.4)
Monocytes Relative: 7 % (ref 3–12)
NEUTROS PCT: 60 % (ref 43–77)
Neutro Abs: 7.4 10*3/uL (ref 1.7–7.7)
PLATELETS: 314 10*3/uL (ref 150–400)
RBC: 4.73 MIL/uL (ref 3.87–5.11)
RDW: 13.7 % (ref 11.5–15.5)
WBC: 12.3 10*3/uL — AB (ref 4.0–10.5)

## 2014-01-31 NOTE — Patient Instructions (Signed)
Go to the lab on the way out.  We'll contact you with your lab report. Patricia Hoover will call about your referral. Take care.  Glad to see you.  

## 2014-01-31 NOTE — Progress Notes (Signed)
Pre visit review using our clinic review tool, if applicable. No additional management support is needed unless otherwise documented below in the visit note.  She has had some episodic chest pressure, now constant going on for the last month.  Now with more SOB on exertion, ie going up stairs.  She notes a few skipped beats occ.  No syncope.  She can get a little lightheaded.  She has a question about her BP med, change in manufacturer so the pill looks different.  BP has been higher at night after the med source change but this may be due to her cuff, d/w pt.  BP was okay today at [facility].   Meds, vitals, and allergies reviewed.   ROS: See HPI.  Otherwise, noncontributory.  GEN: nad, alert and oriented HEENT: mucous membranes moist NECK: supple w/o LA CV: rrr.  no murmur PULM: ctab, no inc wob, not sob ABD: soft, +bs EXT: no edema SKIN: no acute rash Chest wall (sternum) ttp

## 2014-02-01 DIAGNOSIS — R0602 Shortness of breath: Secondary | ICD-10-CM | POA: Insufficient documentation

## 2014-02-01 LAB — BRAIN NATRIURETIC PEPTIDE: BRAIN NATRIURETIC PEPTIDE: 5.8 pg/mL (ref 0.0–100.0)

## 2014-02-01 LAB — TROPONIN I

## 2014-02-01 LAB — TSH: TSH: 1.118 u[IU]/mL (ref 0.350–4.500)

## 2014-02-01 NOTE — Assessment & Plan Note (Signed)
The chest pain is clearly reproducible on exam, so that isn't worrisome.  Her dec in exercise tolerance and her exertional SOB are the main issues here.  EKG w/o acute changes.  Labs pending. Will ask for outpatient cards eval.  I have asked patient to refrain from high exertion in the meantime.  She agrees. >25 minutes spent in face to face time with patient, >50% spent in counselling or coordination of care.

## 2014-02-07 ENCOUNTER — Encounter (INDEPENDENT_AMBULATORY_CARE_PROVIDER_SITE_OTHER): Payer: Self-pay

## 2014-02-07 ENCOUNTER — Ambulatory Visit (INDEPENDENT_AMBULATORY_CARE_PROVIDER_SITE_OTHER): Payer: BLUE CROSS/BLUE SHIELD | Admitting: Cardiovascular Disease

## 2014-02-07 ENCOUNTER — Encounter: Payer: Self-pay | Admitting: Cardiovascular Disease

## 2014-02-07 VITALS — BP 159/96 | HR 75 | Ht 58.5 in | Wt 174.2 lb

## 2014-02-07 DIAGNOSIS — R079 Chest pain, unspecified: Secondary | ICD-10-CM

## 2014-02-07 DIAGNOSIS — I10 Essential (primary) hypertension: Secondary | ICD-10-CM | POA: Diagnosis not present

## 2014-02-07 DIAGNOSIS — E785 Hyperlipidemia, unspecified: Secondary | ICD-10-CM

## 2014-02-07 NOTE — Patient Instructions (Signed)
[place]  Your caregiver has ordered a Stress Test with nuclear imaging. The purpose of this test is to evaluate the blood supply to your heart muscle. This procedure is referred to as a "Non-Invasive Stress Test." This is because other than having an IV started in your vein, nothing is inserted or "invades" your body. Cardiac stress tests are done to find areas of poor blood flow to the heart by determining the extent of coronary artery disease (CAD). Some patients exercise on a treadmill, which naturally increases the blood flow to your heart, while others who are  unable to walk on a treadmill due to physical limitations have a pharmacologic/chemical stress agent called Lexiscan . This medicine will mimic walking on a treadmill by temporarily increasing your coronary blood flow.   Please note: these test may take anywhere between 2-4 hours to complete  PLEASE REPORT TO [facility] AT THE FIRST DESK WILL DIRECT YOU WHERE TO GO  Date of Procedure:_________01/26/16____________________________   Arrival Time for Procedure:______0745 am________________________   PLEASE NOTIFY THE OFFICE AT LEAST 24 HOURS IN ADVANCE IF YOU ARE UNABLE TO KEEP YOUR APPOINTMENT.  [phone] AND  PLEASE NOTIFY NUCLEAR MEDICINE AT [facility] AT LEAST 24 HOURS IN ADVANCE IF YOU ARE UNABLE TO KEEP YOUR APPOINTMENT. [phone]  How to prepare for your Myoview test:  1. Do not eat or drink after midnight 2. No caffeine for 24 hours prior to test 3. No smoking 24 hours prior to test. 4. Your medication may be taken with water.  If your doctor stopped a medication because of this test, do not take that medication. 5. Ladies, please do not wear dresses.  Skirts or pants are appropriate. Please wear a short sleeve shirt. 6. No perfume, cologne or lotion. 7. Wear comfortable walking shoes. No heels!       Your physician recommends that you schedule a follow-up appointment in:  1 month  with Dr. Fletcher Anon

## 2014-02-08 DIAGNOSIS — R079 Chest pain, unspecified: Secondary | ICD-10-CM | POA: Insufficient documentation

## 2014-02-08 NOTE — Progress Notes (Signed)
Primary care physician: Dr. Damita Dunnings  HPI  This is a pleasant 69 year old female who was referred for evaluation of chest pain and shortness of breath. She has no previous cardiac history. She moved from [place] in 2009. She has known history of hypertension and hyperlipidemia. No history of diabetes. She is not a smoker. She does have family history of coronary artery disease later in life. She has been having intermittent episodes of substernal chest pain described as tightness feeling which mostly happens at rest and occasionally with certain activities but has not been consistent. She has noticed some association with certain types of food such as fruits. She does report worsening exertional dyspnea over the same period. No orthopnea, PND or lower extremity edema.   Allergies  Allergen Reactions  . Bisphosphonates     GI side effects  . Prolia [Denosumab]     GI upset     Current Outpatient Prescriptions on File Prior to Visit  Medication Sig Dispense Refill  . aspirin 81 MG tablet Take 81 mg by mouth daily.    Marland Kitchen BIOTIN PO Take by mouth daily.    . cholecalciferol (VITAMIN D) 1000 UNITS tablet Take 1,000 Units by mouth daily.    Marland Kitchen ibuprofen (ADVIL,MOTRIN) 800 MG tablet Take 1 tablet (800 mg total) by mouth 3 (three) times daily as needed (with food.). 30 tablet 3  . lisinopril-hydrochlorothiazide (PRINZIDE,ZESTORETIC) 10-12.5 MG per tablet Take 1 tablet by mouth daily. 90 tablet 1  . MOVIPREP 100 G SOLR moviprep as directed. No substitutions 1 kit 0  . simvastatin (ZOCOR) 10 MG tablet Take 1 tablet (10 mg total) by mouth every other day. 45 tablet 3   No current facility-administered medications on file prior to visit.     Past Medical History  Diagnosis Date  . Hypertension   . High cholesterol   . Arrhythmia     possible hx, resolved prev  . Hx of colonic polyps   . Renal stones   . Osteoporosis 2010    t score - 3.9  . Carpal tunnel syndrome   . H/O exercise stress test  2012    normal  . Blood transfusion without reported diagnosis 1972    had reaction; had to stop  . Heart murmur     as child     Past Surgical History  Procedure Laterality Date  . Abdominal hysterectomy  1972    for endometriosis  . Lithotripsy      for kidney stone x5     Family History  Problem Relation Age of Onset  . Heart disease Mother   . Renal Disease Mother   . Heart failure Mother   . Colon cancer Neg Hx      History   Social History  . Marital Status: Widowed    Spouse Name: N/A    Number of Children: N/A  . Years of Education: N/A   Occupational History  . Retired     Community education officer   Social History Main Topics  . Smoking status: Never Smoker   . Smokeless tobacco: Never Used  . Alcohol Use: Yes     Comment: occasional  . Drug Use: No  . Sexual Activity: Not on file   Other Topics Concern  . Not on file   Social History Narrative   Education:  1 year Business School   Retired back to [employer] after working in [employer], [employer]   Widowed '93  2 kids     ROS A 10 point review of system was performed. It is negative other than that mentioned in the history of present illness.   PHYSICAL EXAM   BP 159/96 mmHg  Pulse 75  Ht 4' 10.5" (1.486 m)  Wt 174 lb 4 oz (79.039 kg)  BMI 35.79 kg/m2 Constitutional: She is oriented to person, place, and time. She appears well-developed and well-nourished. No distress.  HENT: No nasal discharge.  Head: Normocephalic and atraumatic.  Eyes: Pupils are equal and round. No discharge.  Neck: Normal range of motion. Neck supple. No JVD present. No thyromegaly present.  Cardiovascular: Normal rate, regular rhythm, normal heart sounds. Exam reveals no gallop and no friction rub. No murmur heard.  Pulmonary/Chest: Effort normal and breath sounds normal. No stridor. No respiratory distress. She has no wheezes. She has no rales. She exhibits no tenderness.  Abdominal: Soft. Bowel  sounds are normal. She exhibits no distension. There is no tenderness. There is no rebound and no guarding.  Musculoskeletal: Normal range of motion. She exhibits no edema and no tenderness.  Neurological: She is alert and oriented to person, place, and time. Coordination normal.  Skin: Skin is warm and dry. No rash noted. She is not diaphoretic. No erythema. No pallor.  Psychiatric: She has a normal mood and affect. Her behavior is normal. Judgment and thought content normal.     EKG: Recent EKG showed normal sinus rhythm with left atrial enlargement and no significant ST or T wave changes.   ASSESSMENT AND PLAN

## 2014-02-08 NOTE — Assessment & Plan Note (Signed)
Blood pressure is mildly elevated today. Continue to monitor and adjust medications as needed.

## 2014-02-08 NOTE — Assessment & Plan Note (Signed)
The chest pain is overall atypical and has been mostly at rest and not exertional. Some of the chest pain is reproducible but that doesn't always exclude other etiologies. She also reports some association with certain types of food which raises the possibility of GERD or peptic ulcer disease. Nonetheless, she clearly describes worsening exertional dyspnea and she does have risk factors for coronary artery disease. Thus, I recommend evaluation with a treadmill nuclear stress test.

## 2014-02-08 NOTE — Assessment & Plan Note (Signed)
Lab Results  Component Value Date   CHOL 177 05/28/2013   HDL 45.10 05/28/2013   LDLCALC 109* 05/28/2013   TRIG 114.0 05/28/2013   CHOLHDL 4 05/28/2013   She is currently on simvastatin 10 mg once daily. Recommend a target LDL of less than 100.

## 2014-02-12 ENCOUNTER — Ambulatory Visit: Payer: Self-pay | Admitting: Cardiovascular Disease

## 2014-02-12 DIAGNOSIS — R079 Chest pain, unspecified: Secondary | ICD-10-CM | POA: Diagnosis not present

## 2014-02-13 ENCOUNTER — Telehealth: Payer: Self-pay

## 2014-02-13 ENCOUNTER — Other Ambulatory Visit: Payer: Self-pay

## 2014-02-13 DIAGNOSIS — R079 Chest pain, unspecified: Secondary | ICD-10-CM

## 2014-02-13 MED ORDER — LISINOPRIL-HYDROCHLOROTHIAZIDE 10-12.5 MG PO TABS: 1.0000 | ORAL_TABLET | Freq: Every day | ORAL | Status: DC

## 2014-02-13 NOTE — Telephone Encounter (Signed)
Pt left /vm requesting lisinopril to be changed to a different manufacturer; the lisinopril from manufacturer now is causing BP to be higher,  BP average is 140/70 - 90.  [facility]ist told pt if doctor would specify manufacturer pt will get requested manufacturer. Pt request lisinopril that is manufactured by [employer]. [facility].Please advise.

## 2014-02-13 NOTE — Telephone Encounter (Signed)
Pt would like stress test results.  

## 2014-02-13 NOTE — Telephone Encounter (Signed)
Done,sent

## 2014-02-14 NOTE — Telephone Encounter (Signed)
Informed patient I will call back with results as soon as Dr. Fletcher Anon reviews them.  Patient verbalized understanding

## 2014-02-14 NOTE — Telephone Encounter (Signed)
Pt would like stress test results.  

## 2014-02-18 ENCOUNTER — Ambulatory Visit: Payer: Medicare Other | Admitting: Cardiovascular Disease

## 2014-02-21 MED ORDER — SIMVASTATIN 10 MG PO TABS: 10.0000 mg | ORAL_TABLET | ORAL | Status: DC

## 2014-02-21 NOTE — Telephone Encounter (Signed)
Pt left v/m [facility] and pt wants to know status of lisinopril being changed to [employer]oz as the manufacturer. Lisinopril was sent on 02/13/14 as requested. [facility]. Left detailed v/m as requested this info.

## 2014-02-25 ENCOUNTER — Telehealth: Payer: Self-pay | Admitting: *Deleted

## 2014-02-25 NOTE — Telephone Encounter (Signed)
She can exercise

## 2014-02-25 NOTE — Telephone Encounter (Signed)
Pt called stated she had some questions about her stress test results,  Please call patient.

## 2014-02-25 NOTE — Telephone Encounter (Signed)
Informed patient of Dr. Jacklynn Ganong response  Patient verbalized understanding

## 2014-02-25 NOTE — Telephone Encounter (Signed)
Patient stated that she understands that her stress test is normal  Patient is still having some shortness of breath  She is planning on keeping her follow up appointment  She wants to know if she can go ahead and continue her exercise program

## 2014-03-11 ENCOUNTER — Encounter: Payer: Self-pay | Admitting: Cardiovascular Disease

## 2014-03-11 ENCOUNTER — Ambulatory Visit (INDEPENDENT_AMBULATORY_CARE_PROVIDER_SITE_OTHER): Payer: Medicare Other | Admitting: Cardiovascular Disease

## 2014-03-11 VITALS — BP 128/68 | HR 81 | Ht <= 58 in | Wt 172.5 lb

## 2014-03-11 DIAGNOSIS — R0789 Other chest pain: Secondary | ICD-10-CM

## 2014-03-11 NOTE — Patient Instructions (Signed)
Continue same medications.   Follow up as needed.  

## 2014-03-11 NOTE — Progress Notes (Signed)
Primary care physician: Dr. Damita Dunnings  HPI  This is a pleasant 69 year old female who is here today for a follow-up visit regarding chest pain and shortness of breath. She has no previous cardiac history. She moved from [place] in 2009. She has known history of hypertension and hyperlipidemia. No history of diabetes. She is not a smoker. She does have family history of coronary artery disease later in life. She was seen recently for intermittent episodes of substernal chest pain described as tightness feeling which mostly happens at rest and occasionally with certain activities but has not been consistent.  She underwent cardiac evaluation. A treadmill nuclear stress test showed no evidence of ischemia with normal ejection fraction.  Allergies  Allergen Reactions  . Bisphosphonates     GI side effects  . Prolia [Denosumab]     GI upset     Current Outpatient Prescriptions on File Prior to Visit  Medication Sig Dispense Refill  . aspirin 81 MG tablet Take 81 mg by mouth daily.    . cholecalciferol (VITAMIN D) 1000 UNITS tablet Take 1,000 Units by mouth daily.    Marland Kitchen ibuprofen (ADVIL,MOTRIN) 800 MG tablet Take 1 tablet (800 mg total) by mouth 3 (three) times daily as needed (with food.). 30 tablet 3  . lisinopril-hydrochlorothiazide (PRINZIDE,ZESTORETIC) 10-12.5 MG per tablet Take 1 tablet by mouth daily. Dispense med produced by [employer] 90 tablet 3  . MOVIPREP 100 G SOLR moviprep as directed. No substitutions 1 kit 0  . simvastatin (ZOCOR) 10 MG tablet Take 1 tablet (10 mg total) by mouth every other day. 45 tablet 1   No current facility-administered medications on file prior to visit.     Past Medical History  Diagnosis Date  . Hypertension   . High cholesterol   . Arrhythmia     possible hx, resolved prev  . Hx of colonic polyps   . Renal stones   . Osteoporosis 2010    t score - 3.9  . Carpal tunnel syndrome   . H/O exercise stress test 2012    normal  . Blood transfusion  without reported diagnosis 1972    had reaction; had to stop  . Heart murmur     as child     Past Surgical History  Procedure Laterality Date  . Abdominal hysterectomy  1972    for endometriosis  . Lithotripsy      for kidney stone x5     Family History  Problem Relation Age of Onset  . Heart disease Mother   . Renal Disease Mother   . Heart failure Mother   . Colon cancer Neg Hx      History   Social History  . Marital Status: Widowed    Spouse Name: N/A  . Number of Children: N/A  . Years of Education: N/A   Occupational History  . Retired     Community education officer   Social History Main Topics  . Smoking status: Never Smoker   . Smokeless tobacco: Never Used  . Alcohol Use: Yes     Comment: occasional  . Drug Use: No  . Sexual Activity: Not on file   Other Topics Concern  . Not on file   Social History Narrative   Education:  1 year Business School   Retired back to [employer] after working in [employer], [employer]   Widowed '93   2 kids     ROS A 10 point review of system was  performed. It is negative other than that mentioned in the history of present illness.   PHYSICAL EXAM   BP 128/68 mmHg  Pulse 81  Ht _0  (1.473 m)  Wt 172 lb 8 oz (78.245 kg)  BMI 36.06 kg/m2 Constitutional: She is oriented to person, place, and time. She appears well-developed and well-nourished. No distress.  HENT: No nasal discharge.  Head: Normocephalic and atraumatic.  Eyes: Pupils are equal and round. No discharge.  Neck: Normal range of motion. Neck supple. No JVD present. No thyromegaly present.  Cardiovascular: Normal rate, regular rhythm, normal heart sounds. Exam reveals no gallop and no friction rub. No murmur heard.  Pulmonary/Chest: Effort normal and breath sounds normal. No stridor. No respiratory distress. She has no wheezes. She has no rales. She exhibits no tenderness.  Abdominal: Soft. Bowel sounds are normal. She exhibits no  distension. There is no tenderness. There is no rebound and no guarding.  Musculoskeletal: Normal range of motion. She exhibits no edema and no tenderness.  Neurological: She is alert and oriented to person, place, and time. Coordination normal.  Skin: Skin is warm and dry. No rash noted. She is not diaphoretic. No erythema. No pallor.  Psychiatric: She has a normal mood and affect. Her behavior is normal. Judgment and thought content normal.        ASSESSMENT AND PLAN

## 2014-03-14 ENCOUNTER — Encounter: Payer: Self-pay | Admitting: Cardiovascular Disease

## 2014-03-14 NOTE — Assessment & Plan Note (Signed)
Atypical and likely noncardiac. Recent nuclear stress test showed no evidence of ischemia with normal ejection fraction. I discussed with the patient the importance of lifestyle changes in order to decrease the chance of future coronary artery disease and cardiovascular events. We discussed the importance of controlling risk factors, healthy diet as well as regular exercise. I also explained to him that a normal stress test does not rule out atherosclerosis.

## 2014-03-26 ENCOUNTER — Telehealth: Payer: Self-pay | Admitting: *Deleted

## 2014-03-26 ENCOUNTER — Encounter: Payer: Self-pay | Admitting: *Deleted

## 2014-03-26 NOTE — Telephone Encounter (Signed)
Letter printed and placed up front for daughter to pick up  I informed patients daughter that I could not fax her mothers medical information to the fax number provided  Patient and daughter verbalized understanding

## 2014-03-26 NOTE — Telephone Encounter (Signed)
Pt daughter calling with fax number: [phone]

## 2014-03-26 NOTE — Telephone Encounter (Signed)
Pt daughter asking if she can get a letter or something showing that she came with pt for her appointment.  She needs this for her job.  Please call if we can and when it is done.

## 2014-03-26 NOTE — Telephone Encounter (Signed)
Pt calling stating she was going to call back with fax number where we need to send this letter. See below

## 2014-07-24 ENCOUNTER — Encounter: Payer: Self-pay | Admitting: Internal Medicine

## 2014-08-05 ENCOUNTER — Other Ambulatory Visit: Payer: Self-pay

## 2014-08-05 MED ORDER — SIMVASTATIN 10 MG PO TABS: 10.0000 mg | ORAL_TABLET | ORAL | Status: DC

## 2014-08-05 NOTE — Telephone Encounter (Signed)
Pt left v/m requesting [facility]. Refill # 30 with note pt needs to call for appt before further refills.last cpx 06/04/13. Pt notified and scheduled CPX on 10/25/14.

## 2014-08-06 ENCOUNTER — Encounter: Payer: BLUE CROSS/BLUE SHIELD | Admitting: Internal Medicine

## 2014-08-30 ENCOUNTER — Ambulatory Visit: Payer: BLUE CROSS/BLUE SHIELD | Admitting: Family Medicine

## 2014-08-30 ENCOUNTER — Telehealth: Payer: Self-pay | Admitting: Family Medicine

## 2014-08-30 NOTE — Telephone Encounter (Signed)
[facility]  Patient Name: Patricia Hoover  DOB: 1945/04/12    Initial Comment Caller states she is breaking out in bumps   Nurse Assessment  Nurse: Justine Null, RN, Rodena Piety Date/Time (Eastern Time): 08/30/2014 4:43:44 PM  Confirm and document reason for call. If symptomatic, describe symptoms. ---Caller states she is breaking out in bumps caller stated that she has been having red areas and has on the arm chest and has face and has been slightly raised and has clear raised and slightly itchy at present and started about Sunday and has been in the yard on August 3 and 4 th and has been no reps distress  Has the patient traveled out of the country within the last 30 days? ---No  Does the patient require triage? ---Yes  Related visit to physician within the last 2 weeks? ---No  Does the PT have any chronic conditions? (i.e. diabetes, asthma, etc.) ---Yes  List chronic conditions. ---HTN high cholesterol     Guidelines    Guideline Title Affirmed Question Affirmed Notes  Rash or Redness - Localized Localized rash present > 7 days    Final Disposition User   See PCP When Office is Open (within 3 days) Justine Null, RN, Rodena Piety    Disagree/Comply: Comply

## 2014-08-30 NOTE — Telephone Encounter (Signed)
Pt has appt with Dr Damita Dunnings on 09/02/14.

## 2014-09-01 NOTE — Telephone Encounter (Signed)
Will see at OV.  Thanks.  

## 2014-09-02 ENCOUNTER — Encounter: Payer: Self-pay | Admitting: Family Medicine

## 2014-09-02 ENCOUNTER — Ambulatory Visit (INDEPENDENT_AMBULATORY_CARE_PROVIDER_SITE_OTHER): Payer: Medicare Other | Admitting: Family Medicine

## 2014-09-02 VITALS — BP 126/74 | HR 93 | Temp 98.3°F | Wt 166.5 lb

## 2014-09-02 DIAGNOSIS — R21 Rash and other nonspecific skin eruption: Secondary | ICD-10-CM | POA: Diagnosis not present

## 2014-09-02 MED ORDER — PERMETHRIN 5 % EX CREA: 1.0000 "application " | TOPICAL_CREAM | Freq: Once | CUTANEOUS | Status: DC

## 2014-09-02 NOTE — Progress Notes (Signed)
Pre visit review using our clinic review tool, if applicable. No additional management support is needed unless otherwise documented below in the visit note.  Red bumps.  Going on for about 2 weeks.  Lesions sometimes are more visible than others, but they don't resolve.  Itchy, slightly, not worse at night.  No others with similar lesions.  Unclear if she had an exposure with yard work.  No lesions on the legs.  No lesions on hand but on BUE.  This doesn't feel likely poison ivy.  No FCNAVD.    Meds, vitals, and allergies reviewed.   ROS: See HPI.  Otherwise, noncontributory.  nad Diffuse linear small red bumps noted on the arms B, not dermatomal, not ulcerated.

## 2014-09-02 NOTE — Patient Instructions (Signed)
Use the cream from the neck down.  Leave on overnight.  Wash off the next AM.   Wash sheets/bedding/recently worn clothes in hot water and then dry in the dryer.  Take care.  Glad to see you.

## 2014-09-03 ENCOUNTER — Telehealth: Payer: Self-pay | Admitting: Family Medicine

## 2014-09-03 ENCOUNTER — Ambulatory Visit (INDEPENDENT_AMBULATORY_CARE_PROVIDER_SITE_OTHER): Payer: Medicare Other | Admitting: Internal Medicine

## 2014-09-03 ENCOUNTER — Encounter: Payer: Self-pay | Admitting: Internal Medicine

## 2014-09-03 VITALS — BP 118/62 | HR 88 | Temp 98.3°F | Wt 166.0 lb

## 2014-09-03 DIAGNOSIS — K121 Other forms of stomatitis: Secondary | ICD-10-CM | POA: Diagnosis not present

## 2014-09-03 DIAGNOSIS — R21 Rash and other nonspecific skin eruption: Secondary | ICD-10-CM | POA: Insufficient documentation

## 2014-09-03 DIAGNOSIS — R220 Localized swelling, mass and lump, head: Secondary | ICD-10-CM

## 2014-09-03 MED ORDER — METHYLPREDNISOLONE ACETATE 80 MG/ML IJ SUSP
80.0000 mg | Freq: Once | INTRAMUSCULAR | Status: AC
  Administered 2014-09-03: 80 mg via INTRAMUSCULAR

## 2014-09-03 NOTE — Patient Instructions (Signed)
Oral Ulcers Oral ulcers are painful, shallow sores around the lining of the mouth. They can affect the gums, the inside of the lips, and the cheeks. (Sores on the outside of the lips and on the face are different.) They typically first occur in school-aged children and teenagers. Oral ulcers may also be called canker sores or cold sores. CAUSES  Canker sores and cold sores can be caused by many factors including:  Infection.  Injury.  Sun exposure.  Medications.  Emotional stress.  Food allergies.  Vitamin deficiencies.  Toothpastes containing sodium lauryl sulfate. The herpes virus can be the cause of mouth ulcers. The first infection can be severe and cause 10 or more ulcers on the gums, tongue, and lips with fever and difficulty in swallowing. This infection usually occurs between the ages of 1 and 3 years.  SYMPTOMS  The typical sore is about  inch (6 mm) in size and is an oval or round ulcer with red borders. DIAGNOSIS  Your caregiver can diagnose simple oral ulcers by examination. Additional testing is usually not required.  TREATMENT  Treatment is aimed at pain relief. Generally, oral ulcers resolve by themselves within 1 to 2 weeks without medication and are not contagious unless caused by herpes (and other viruses). Antibiotics are not effective with mouth sores. Avoid direct contact with others until the ulcer is completely healed. See your caregiver for follow-up care as recommended. Also:  Offer a soft diet.  Encourage plenty of fluids to prevent dehydration. Popsicles and milk shakes can be helpful.  Avoid acidic and salty foods and drinks such as orange juice.  Infants and young children will often refuse to drink because of pain. Using a teaspoon, cup, or syringe to give small amounts of fluids frequently can help prevent dehydration.  Cold compresses on the face may help reduce pain.  Pain medication can help control soreness.  A solution of diphenhydramine  mixed with a liquid antacid can be useful to decrease the soreness of ulcers. Consult a caregiver for the dosing.  Liquids or ointments with a numbing ingredient may be helpful when used as recommended.  Older children and teenagers can rinse their mouth with a salt-water mixture (1/2 teaspoon of salt in 8 ounces of water) four times a day. This treatment is uncomfortable but may reduce the time the ulcers are present.  There are many over-the-counter throat lozenges and medications available for oral ulcers. Their effectiveness has not been studied.  Consult your medical caregiver prior to using homeopathic treatments for oral ulcers. SEEK MEDICAL CARE IF:   You think your child needs to be seen.  The pain worsens and you cannot control it.  There are 4 or more ulcers.  The lips and gums begin to bleed and crust.  A single mouth ulcer is near a tooth that is causing a toothache or pain.  Your child has a fever, swollen face, or swollen glands.  The ulcers began after starting a medication.  Mouth ulcers keep reoccurring or last more than 2 weeks.  You think your child is not taking adequate fluids. SEEK IMMEDIATE MEDICAL CARE IF:   Your child has a high fever.  Your child is unable to swallow or becomes dehydrated.  Your child looks or acts very ill.  An ulcer caused by a chemical your child accidentally put in their mouth. Document Released: 02/12/2004 Document Revised: 05/21/2013 Document Reviewed: 09/26/2008 ExitCare Patient Information 2015 [employer]. This information is not intended to replace advice   given to you by your health care provider. Make sure you discuss any questions you have with your health care provider.  

## 2014-09-03 NOTE — Progress Notes (Signed)
Subjective:    Patient ID: Patricia Hoover, female    DOB: 1945-12-10, 69 y.o.   MRN: [id number]  HPI  Pt presents to the clinic today with c/o swelling of the left side of her upper lip. She reports this started a few hours ago. It is not painful and she has no difficulty breathing. She has not tried anything OTC. She did see Dr. Damita Dunnings yesterday with c/o a rash and he did put her on Permethrin. She did not put any of it on her face. She is on Lisinopril but has been on this for many years without any issues. She did eat a piece of strawberry cheesecake today but has had this many times in the past. She denies getting stung or bitten by any insects.  Review of Systems      Past Medical History  Diagnosis Date  . Hypertension   . High cholesterol   . Arrhythmia     possible hx, resolved prev  . Hx of colonic polyps   . Renal stones   . Osteoporosis 2010    t score - 3.9  . Carpal tunnel syndrome   . H/O exercise stress test 2012    normal  . Blood transfusion without reported diagnosis 1972    had reaction; had to stop  . Heart murmur     as child    Current Outpatient Prescriptions  Medication Sig Dispense Refill  . aspirin 81 MG tablet Take 81 mg by mouth daily.    . cholecalciferol (VITAMIN D) 1000 UNITS tablet Take 1,000 Units by mouth daily.    Marland Kitchen ibuprofen (ADVIL,MOTRIN) 800 MG tablet Take 1 tablet (800 mg total) by mouth 3 (three) times daily as needed (with food.). 30 tablet 3  . lisinopril-hydrochlorothiazide (PRINZIDE,ZESTORETIC) 10-12.5 MG per tablet Take 1 tablet by mouth daily. Dispense med produced by Sandoz 90 tablet 3  . permethrin (ACTICIN) 5 % cream Apply 1 application topically once. 60 g 0  . simvastatin (ZOCOR) 10 MG tablet Take 1 tablet (10 mg total) by mouth every other day. 45 tablet 0  . MOVIPREP 100 G SOLR moviprep as directed. No substitutions 1 kit 0   No current facility-administered medications for this visit.    Allergies  Allergen Reactions  .  Bisphosphonates     GI side effects  . Prolia [Denosumab]     GI upset    Family History  Problem Relation Age of Onset  . Heart disease Mother   . Renal Disease Mother   . Heart failure Mother   . Colon cancer Neg Hx     Social History   Social History  . Marital Status: Widowed    Spouse Name: N/A  . Number of Children: N/A  . Years of Education: N/A   Occupational History  . Retired     Community education officer   Social History Main Topics  . Smoking status: Never Smoker   . Smokeless tobacco: Never Used  . Alcohol Use: Yes     Comment: occasional  . Drug Use: No  . Sexual Activity: Not on file   Other Topics Concern  . Not on file   Social History Narrative   Education:  1 year Business School   Retired back to [employer] after working in [employer], [employer]   Widowed '93   2 kids     Constitutional: Denies fever, malaise, fatigue, headache or abrupt weight changes.  HEENT: Pt reports  lip swelling. Denies eye pain, eye redness, ear pain, ringing in the ears, wax buildup, runny nose, nasal congestion, bloody nose, or sore throat. Respiratory: Denies difficulty breathing, shortness of breath, cough or sputum production.   Cardiovascular: Denies chest pain, chest tightness, palpitations or swelling in the hands or feet.   No other specific complaints in a complete review of systems (except as listed in HPI above).  Objective:   Physical Exam   BP 118/62 mmHg  Pulse 88  Temp(Src) 98.3 F (36.8 C) (Oral)  Wt 166 lb (75.297 kg)  SpO2 99% Wt Readings from Last 3 Encounters:  09/03/14 166 lb (75.297 kg)  09/02/14 166 lb 8 oz (75.524 kg)  03/11/14 172 lb 8 oz (78.245 kg)    General: Appears their stated age, well developed, well nourished in NAD. Skin: Warm, dry and intact. No rashes, lesions or ulcerations noted. HEENT: 1 cm circumscribed area of swelling on the left upper lip. Just posterior to the swelling is a small aphthous ulcer. Neck:  No adenopathy noted. Cardiovascular: Normal rate and rhythm. S1,S2 noted.  No murmur, rubs or gallops noted.  Pulmonary/Chest: Normal effort and positive vesicular breath sounds. No respiratory distress or stridor. No wheezes, rales or ronchi noted.   Neurological: Alert and oriented.   BMET    Component Value Date/Time   NA 140 01/31/2014 1652   K 3.5 01/31/2014 1652   CL 101 01/31/2014 1652   CO2 30 01/31/2014 1652   GLUCOSE 108* 01/31/2014 1652   BUN 20 01/31/2014 1652   BUN 12 07/06/2011   CREATININE 0.68 01/31/2014 1652   CREATININE 0.6 05/28/2013 0848   CALCIUM 9.4 01/31/2014 1652   GFRNONAA 75* 04/05/2012 2039   GFRAA 86* 04/05/2012 2039    Lipid Panel     Component Value Date/Time   CHOL 177 05/28/2013 0848   TRIG 114.0 05/28/2013 0848   TRIG 126 07/06/2011   HDL 45.10 05/28/2013 0848   CHOLHDL 4 05/28/2013 0848   VLDL 22.8 05/28/2013 0848   LDLCALC 109* 05/28/2013 0848    CBC    Component Value Date/Time   WBC 12.3* 01/31/2014 1652   RBC 4.73 01/31/2014 1652   HGB 13.6 01/31/2014 1652   HCT 41.2 01/31/2014 1652   PLT 314 01/31/2014 1652   MCV 87.1 01/31/2014 1652   MCH 28.8 01/31/2014 1652   MCHC 33.0 01/31/2014 1652   RDW 13.7 01/31/2014 1652   LYMPHSABS 3.9 01/31/2014 1652   MONOABS 0.9 01/31/2014 1652   EOSABS 0.1 01/31/2014 1652   BASOSABS 0.0 01/31/2014 1652    Hgb A1C No results found for: HGBA1C      Assessment & Plan:   Swelling of lip secondary to aphthous ulcer:  Advised her to swish with salt water TID 80 mg Depo IM today Ok to take Benadryl as needed for ongoing swelling Return precautions given  RTC as needed or if symptoms persist or worsen

## 2014-09-03 NOTE — Telephone Encounter (Signed)
Agree with ER.  Thanks.  

## 2014-09-03 NOTE — Assessment & Plan Note (Signed)
Likely scabies, thought not very itchy.  Would still treat with permethrin.  If not better, then she'll update me.  Routine cautions given.  She agrees.

## 2014-09-03 NOTE — Telephone Encounter (Signed)
[facility]    --------------------------------------------------------------------------------   Patient Name: Patricia Hoover  Gender: Female  DOB: 1945/04/18   Age: 69 Y 10 M 22 D  Return Phone Number: [phone] (Primary), [phone] (Secondary)  Address: [address]    City/State/Zip: [place]  [address]   Client [facility] - Client  Client Site [facility] - Day  Physician Renford Dills   Contact Type Call  Call Type Triage / Clinical  Relationship To Patient Self  Return Phone Number [phone] (Primary)  Chief Complaint Allergic reaction (unknown symptoms)  Initial Comment caller states her lip is swelling, she is unsure why she is having an allergic reaction  PreDisposition Go to ED       Nurse Assessment  Nurse: Amalia Hailey, RN, Melissa Date/Time (Eastern Time): 09/03/2014 3:02:17 PM  Confirm and document reason for call. If symptomatic, describe symptoms. ---caller states her lip is swelling, she is unsure why she is having an allergic reaction    Has the patient traveled out of the country within the last 30 days? ---Not Applicable    Does the patient require triage? ---Yes    Related visit to physician within the last 2 weeks? ---Yes    Does the PT have any chronic conditions? (i.e. diabetes, asthma, etc.) ---Yes    List chronic conditions. ---hypertension,           Guidelines          Guideline Title Affirmed Question Affirmed Notes Nurse Date/Time Eilene Ghazi Time)  Lip Swelling Taking an ACE Inhibitor medication (e.g., benazepril/LOTENSIN, captopril/CAPOTEN, enalapril/VASOTEC, lisinopril/ZESTRIL)    Amalia Hailey, RN, Melissa 09/03/2014 3:06:13 PM    Disp. Time Eilene Ghazi Time) Disposition Final User    09/03/2014 2:57:57 PM Send To Clinical Follow Up Queue   [facility]      09/03/2014 3:11:36 PM Go to ED Now Yes  Amalia Hailey, RN, Lenna Sciara            Caller Understands: Yes  Disagree/Comply: Comply       Care Advice Given Per Guideline        GO TO ED NOW: You need to be seen in the Emergency Department. Go to the ER at ___________ [place] now. Drive carefully. BRING MEDICINES: * Please bring a list of your current medicines when you go to see the doctor. * It is also a good idea to bring the pill bottles too. This will help the doctor to make certain you are taking the right medicines and the right dose. CARE ADVICE given per Lip Swelling (Adult) guideline.    After Care Instructions Given        Call Event Type User Date / Time Description        --------------------------------------------------------------------------------         Comments  User: Colin Ina, RN Date/Time Eilene Ghazi Time): 09/03/2014 3:04:45 PM  Caller reports she is being treated for Scabies- Permetherin 5% cream.    Referrals  [facility] - ED

## 2014-09-03 NOTE — Progress Notes (Signed)
Pre visit review using our clinic review tool, if applicable. No additional management support is needed unless otherwise documented below in the visit note. 

## 2014-09-04 ENCOUNTER — Ambulatory Visit (AMBULATORY_SURGERY_CENTER): Payer: Self-pay

## 2014-09-04 VITALS — Ht <= 58 in | Wt 166.0 lb

## 2014-09-04 DIAGNOSIS — Z860100 Personal history of colon polyps, unspecified: Secondary | ICD-10-CM

## 2014-09-04 MED ORDER — SUPREP BOWEL PREP KIT 17.5-3.13-1.6 GM/177ML PO SOLN: 1.0000 | Freq: Once | ORAL | Status: DC

## 2014-09-04 NOTE — Progress Notes (Signed)
No allergies to eggs or soy No home oxygen No diet/weight loss meds No past problems with anesthesia except difficult to wake up even with propofol; be careful  Has email  Refused emmi

## 2014-09-05 ENCOUNTER — Telehealth: Payer: Self-pay | Admitting: Internal Medicine

## 2014-09-05 NOTE — Telephone Encounter (Signed)
I have spoken to patient and advised that I have placed a free prep at front desk for her. She verbalizes understanding.

## 2014-09-18 ENCOUNTER — Encounter: Payer: Self-pay | Admitting: Internal Medicine

## 2014-09-18 ENCOUNTER — Ambulatory Visit (AMBULATORY_SURGERY_CENTER): Payer: Medicare Other | Admitting: Internal Medicine

## 2014-09-18 VITALS — BP 133/89 | HR 79 | Temp 98.8°F | Resp 19 | Ht <= 58 in | Wt 166.0 lb

## 2014-09-18 DIAGNOSIS — D123 Benign neoplasm of transverse colon: Secondary | ICD-10-CM

## 2014-09-18 DIAGNOSIS — I10 Essential (primary) hypertension: Secondary | ICD-10-CM | POA: Diagnosis not present

## 2014-09-18 DIAGNOSIS — R002 Palpitations: Secondary | ICD-10-CM | POA: Diagnosis not present

## 2014-09-18 DIAGNOSIS — Z86010 Personal history of colonic polyps: Secondary | ICD-10-CM | POA: Diagnosis present

## 2014-09-18 MED ORDER — SODIUM CHLORIDE 0.9 % IV SOLN: 500.0000 mL | INTRAVENOUS | Status: DC

## 2014-09-18 NOTE — Op Note (Signed)
[facility]  [address]. [place], [address]   COLONOSCOPY PROCEDURE REPORT  PATIENT: Patricia, Hoover  MR#: [id number] BIRTHDATE: Sep 27, 1945 , 68  yrs. old GENDER: female ENDOSCOPIST: Jerene Bears, MD REFERRED TT:[id number] Duncan, M.D. PROCEDURE DATE:  09/18/2014 PROCEDURE:   Colonoscopy, surveillance and Colonoscopy with cold biopsy polypectomy First Screening Colonoscopy - Avg.  risk and is 50 yrs.  old or older - No.  Prior Negative Screening - Now for repeat screening. N/A  History of Adenoma - Now for follow-up colonoscopy & has been > or = to 3 yrs.  Yes hx of adenoma.  Has been 3 or more years since last colonoscopy.  Polyps removed today? Yes ASA CLASS:   Class II INDICATIONS:Surveillance due to prior colonic neoplasia and PH Colon Adenoma, last colon 2009 and normal. MEDICATIONS: Monitored anesthesia care and Propofol 200 mg IV  DESCRIPTION OF PROCEDURE:   After the risks benefits and alternatives of the procedure were thoroughly explained, informed consent was obtained.  The digital rectal exam revealed hemorrhoids.   The LB [id number]  endoscope was introduced through the anus and advanced to the cecum, which was identified by both the appendix and ileocecal valve. No adverse events experienced.   The quality of the prep was good.  (Suprep was used) The instrument was then slowly withdrawn as the colon was fully examined. Estimated blood loss is zero unless otherwise noted in this procedure report.  COLON FINDINGS: A sessile polyp measuring 3 mm in size was found in the transverse colon.  A polypectomy was performed with cold forceps.  The resection was complete, the polyp tissue was completely retrieved and sent to histology.   There was severe diverticulosis noted in the left colon with associated muscular hypertrophy with mild diverticular changes in the ascending colon and hepatic flexure.  Retroflexed views revealed internal hemorrhoids. The  time to cecum = 2.7 Withdrawal time = 10.0   The scope was withdrawn and the procedure completed. COMPLICATIONS: There were no immediate complications.  ENDOSCOPIC IMPRESSION: 1.   Sessile polyp was found in the transverse colon; polypectomy was performed with cold forceps 2.   There was severe diverticulosis noted in the left colon and mild in the right colon  RECOMMENDATIONS: 1.  Await pathology results 2.  High fiber diet 3.  Timing of repeat colonoscopy will be determined by pathology findings. 4.  You will receive a letter within 1-2 weeks with the results of your biopsy as well as final recommendations.  Please call my office if you have not received a letter after 3 weeks.  eSigned:  Jerene Bears, MD 09/18/2014 11:42 AM   cc: Elsie Stain, MD and The Patient

## 2014-09-18 NOTE — Progress Notes (Signed)
Report to PACU, RN, vss, BBS= Clear.  

## 2014-09-18 NOTE — Progress Notes (Signed)
Called to room to assist during endoscopic procedure.  Patient ID and intended procedure confirmed with present staff. Received instructions for my participation in the procedure from the performing physician.  

## 2014-09-18 NOTE — Patient Instructions (Signed)
Colon polyp removed today and diverticulosis seen. Handouts given: POLYPS,DIVERTICULOSIS AND HIGH FIBER DIET.   YOU HAD AN ENDOSCOPIC PROCEDURE TODAY AT [facility]:   Refer to the procedure report that was given to you for any specific questions about what was found during the examination.  If the procedure report does not answer your questions, please call your gastroenterologist to clarify.  If you requested that your care partner not be given the details of your procedure findings, then the procedure report has been included in a sealed envelope for you to review at your convenience later.  YOU SHOULD EXPECT: Some feelings of bloating in the abdomen. Passage of more gas than usual.  Walking can help get rid of the air that was put into your GI tract during the procedure and reduce the bloating. If you had a lower endoscopy (such as a colonoscopy or flexible sigmoidoscopy) you may notice spotting of blood in your stool or on the toilet paper. If you underwent a bowel prep for your procedure, you may not have a normal bowel movement for a few days.  Please Note:  You might notice some irritation and congestion in your nose or some drainage.  This is from the oxygen used during your procedure.  There is no need for concern and it should clear up in a day or so.  SYMPTOMS TO REPORT IMMEDIATELY:   Following lower endoscopy (colonoscopy or flexible sigmoidoscopy):  Excessive amounts of blood in the stool  Significant tenderness or worsening of abdominal pains  Swelling of the abdomen that is new, acute  Fever of 100F or higher   For urgent or emergent issues, a gastroenterologist can be reached at any hour by calling [phone].   DIET: Your first meal following the procedure should be a small meal and then it is ok to progress to your normal diet. Heavy or fried foods are harder to digest and may make you feel nauseous or bloated.  Likewise, meals heavy in dairy and  vegetables can increase bloating.  Drink plenty of fluids but you should avoid alcoholic beverages for 24 hours.  ACTIVITY:  You should plan to take it easy for the rest of today and you should NOT DRIVE or use heavy machinery until tomorrow (because of the sedation medicines used during the test).    FOLLOW UP: Our staff will call the number listed on your records the next business day following your procedure to check on you and address any questions or concerns that you may have regarding the information given to you following your procedure. If we do not reach you, we will leave a message.  However, if you are feeling well and you are not experiencing any problems, there is no need to return our call.  We will assume that you have returned to your regular daily activities without incident.  If any biopsies were taken you will be contacted by phone or by letter within the next 1-3 weeks.  Please call us at [phone] if you have not heard about the biopsies in 3 weeks.    SIGNATURES/CONFIDENTIALITY: You and/or your care partner have signed paperwork which will be entered into your electronic medical record.  These signatures attest to the fact that that the information above on your After Visit Summary has been reviewed and is understood.  Full responsibility of the confidentiality of this discharge information lies with you and/or your care-partner.

## 2014-09-19 ENCOUNTER — Telehealth: Payer: Self-pay

## 2014-09-19 NOTE — Telephone Encounter (Signed)
  Follow up Call-  Call back number 09/18/2014  Post procedure Call Back phone  # [phone]  Permission to leave phone message Yes     Patient questions:  Do you have a fever, pain , or abdominal swelling? No. Pain Score  0 *  Have you tolerated food without any problems? Yes.    Have you been able to return to your normal activities? Yes.    Do you have any questions about your discharge instructions: Diet   No. Medications  No. Follow up visit  No.  Do you have questions or concerns about your Care? No.  Actions: * If pain score is 4 or above: No action needed, pain <4.

## 2014-09-24 ENCOUNTER — Encounter: Payer: Self-pay | Admitting: Internal Medicine

## 2014-10-25 ENCOUNTER — Encounter: Payer: Self-pay | Admitting: Family Medicine

## 2014-10-25 ENCOUNTER — Ambulatory Visit (INDEPENDENT_AMBULATORY_CARE_PROVIDER_SITE_OTHER): Payer: Medicare Other | Admitting: Family Medicine

## 2014-10-25 VITALS — BP 122/62 | HR 70 | Temp 98.2°F | Ht <= 58 in | Wt 169.5 lb

## 2014-10-25 DIAGNOSIS — E785 Hyperlipidemia, unspecified: Secondary | ICD-10-CM | POA: Diagnosis not present

## 2014-10-25 DIAGNOSIS — Z119 Encounter for screening for infectious and parasitic diseases, unspecified: Secondary | ICD-10-CM

## 2014-10-25 DIAGNOSIS — M810 Age-related osteoporosis without current pathological fracture: Secondary | ICD-10-CM

## 2014-10-25 DIAGNOSIS — Z23 Encounter for immunization: Secondary | ICD-10-CM | POA: Diagnosis not present

## 2014-10-25 DIAGNOSIS — Z0000 Encounter for general adult medical examination without abnormal findings: Secondary | ICD-10-CM

## 2014-10-25 DIAGNOSIS — I10 Essential (primary) hypertension: Secondary | ICD-10-CM | POA: Diagnosis not present

## 2014-10-25 MED ORDER — IBUPROFEN 800 MG PO TABS: 800.0000 mg | ORAL_TABLET | Freq: Three times a day (TID) | ORAL | Status: DC | PRN

## 2014-10-25 MED ORDER — LISINOPRIL-HYDROCHLOROTHIAZIDE 10-12.5 MG PO TABS: 1.0000 | ORAL_TABLET | Freq: Every day | ORAL | Status: DC

## 2014-10-25 MED ORDER — SIMVASTATIN 10 MG PO TABS: 10.0000 mg | ORAL_TABLET | ORAL | Status: DC

## 2014-10-25 NOTE — Patient Instructions (Signed)
Come back for fasting labs.  Call about a mammogram.  Take care.  Glad to see you.

## 2014-10-25 NOTE — Progress Notes (Signed)
Pre visit review using our clinic review tool, if applicable. No additional management support is needed unless otherwise documented below in the visit note.  I have personally reviewed the Medicare Annual Wellness questionnaire and have noted 1. The patient's medical and social history 2. Their use of alcohol, tobacco or illicit drugs 3. Their current medications and supplements 4. The patient's functional ability including ADL's, fall risks, home safety risks and hearing or visual             impairment. 5. Diet and physical activities 6. Evidence for depression or mood disorders  The patients weight, height, BMI have been recorded in the chart and visual acuity is per eye clinic.  I have made referrals, counseling and provided education to the patient based review of the above and I have provided the pt with a written personalized care plan for preventive services.  Provider list updated- see scanned forms.  Routine anticipatory guidance given to patient.  See health maintenance.  Flu 2016 Shingles 2013 PNA 2015 Tetanus 2013 Colonoscopy 2016 Breast cancer screening due, d/w pt Advance directive dw pt. Odetta Pink designated if patient were incapacitated.  Cognitive function addressed- see scanned forms- and if abnormal then additional documentation follows.  Pt opts in for HCV screening.  D/w pt re: routine screening.    Hypertension:    Using medication without problems or lightheadedness: yes Chest pain with exertion:no Edema:no Short of breath:no  Elevated Cholesterol: Using medications without problems: yes Muscle aches: no Diet compliance: encouraged.  Exercise: dancing.  Encouraged more exercise.    PMH and SH reviewed  Meds, vitals, and allergies reviewed.   ROS: See HPI.  Otherwise negative.    GEN: nad, alert and oriented HEENT: mucous membranes moist NECK: supple w/o LA CV: rrr. PULM: ctab, no inc wob ABD: soft, +bs EXT: no edema SKIN: no acute rash

## 2014-10-27 NOTE — Assessment & Plan Note (Addendum)
Flu 2016 Shingles 2013 PNA 2015 Tetanus 2013 Colonoscopy 2016 Breast cancer screening due, d/w pt DXA declined by patient, d/w pt.  She didn't tolerate tx prev.  F/u vit D pending.  Advance directive dw pt. Odetta Pink designated if patient were incapacitated.  Cognitive function addressed- see scanned forms- and if abnormal then additional documentation follows.  Pt opts in for HCV screening.  D/w pt re: routine screening.

## 2014-10-28 ENCOUNTER — Other Ambulatory Visit: Payer: Medicare Other

## 2014-10-28 NOTE — Assessment & Plan Note (Signed)
No ADE on meds, continue current meds.  Encouraged diet and exercise, return for labs.  She agrees.

## 2014-10-28 NOTE — Assessment & Plan Note (Signed)
Controlled, continue current meds.  Encouraged diet and exercise, return for labs.  She agrees.

## 2014-10-29 ENCOUNTER — Other Ambulatory Visit (INDEPENDENT_AMBULATORY_CARE_PROVIDER_SITE_OTHER): Payer: Medicare Other

## 2014-10-29 ENCOUNTER — Ambulatory Visit (INDEPENDENT_AMBULATORY_CARE_PROVIDER_SITE_OTHER): Payer: Medicare Other | Admitting: Family Medicine

## 2014-10-29 ENCOUNTER — Other Ambulatory Visit: Payer: Self-pay | Admitting: Family Medicine

## 2014-10-29 ENCOUNTER — Encounter: Payer: Self-pay | Admitting: Family Medicine

## 2014-10-29 VITALS — BP 118/64 | HR 74 | Temp 98.3°F | Wt 167.5 lb

## 2014-10-29 DIAGNOSIS — M810 Age-related osteoporosis without current pathological fracture: Secondary | ICD-10-CM | POA: Diagnosis not present

## 2014-10-29 DIAGNOSIS — T8090XA Unspecified complication following infusion and therapeutic injection, initial encounter: Secondary | ICD-10-CM | POA: Diagnosis not present

## 2014-10-29 DIAGNOSIS — M25562 Pain in left knee: Secondary | ICD-10-CM | POA: Diagnosis not present

## 2014-10-29 DIAGNOSIS — M7522 Bicipital tendinitis, left shoulder: Secondary | ICD-10-CM | POA: Diagnosis not present

## 2014-10-29 DIAGNOSIS — I10 Essential (primary) hypertension: Secondary | ICD-10-CM

## 2014-10-29 DIAGNOSIS — Z119 Encounter for screening for infectious and parasitic diseases, unspecified: Secondary | ICD-10-CM

## 2014-10-29 LAB — VITAMIN D 25 HYDROXY (VIT D DEFICIENCY, FRACTURES): VITD: 31.37 ng/mL (ref 30.00–100.00)

## 2014-10-29 NOTE — Progress Notes (Signed)
Pre visit review using our clinic review tool, if applicable. No additional management support is needed unless otherwise documented below in the visit note.  L knee pain.  Pain walking.  Posterior pain.  Had similar sx years ago (but possibly in the other knee), and was told she had a torn meniscus. She ended up not having surgery (she was scheduled but then cancelled when she spontaneously improved).   Tried heat and a wrap, hasn't tried ice. Can bear weight today.    L elbow pain started this AM.  Pain at distal L biceps.  Pain with ROM at the L elbow.  Started after using her L arm to compensate for her L knee pain, when getting up from a chair.    Likely R deltoid injection site reaction from PNA vaccine, improved now.  Slightly warm but not ttp locally.    Meds, vitals, and allergies reviewed.   ROS: See HPI.  Otherwise, noncontributory.  nad ncat Mmm Neck supple, no LA rrr ctab R deltoid area slightly warm but not fluctuant mass.   L arm with distal biceps tendon ttp, pain with resisted biceps testing.  Normal ROM o/w.  Passive elbow ROM wnl, elbow not ttp o/w L knee with crepitus on ROM with a click on ROM.  Medial and lateral joint line not ttp.  Not bruised.  Can bear weight.

## 2014-10-29 NOTE — Patient Instructions (Signed)
Likely meniscus injury.  Ice, knee sleeve, cane and ibuprofen.  If not better, we can refer you to ortho.  Let me know.  Take care.  Glad to see you.

## 2014-10-30 DIAGNOSIS — M7520 Bicipital tendinitis, unspecified shoulder: Secondary | ICD-10-CM | POA: Insufficient documentation

## 2014-10-30 DIAGNOSIS — T8090XA Unspecified complication following infusion and therapeutic injection, initial encounter: Secondary | ICD-10-CM | POA: Insufficient documentation

## 2014-10-30 LAB — LIPID PANEL
CHOL/HDL RATIO: 4
Cholesterol: 218 mg/dL — ABNORMAL HIGH (ref 0–200)
HDL: 57.7 mg/dL (ref >39.00)
LDL Cholesterol: 135 mg/dL — ABNORMAL HIGH (ref 0–99)
NONHDL: 160.65
TRIGLYCERIDES: 130 mg/dL (ref 0.0–149.0)
VLDL: 26 mg/dL (ref 0.0–40.0)

## 2014-10-30 LAB — COMPREHENSIVE METABOLIC PANEL
ALT: 11 U/L (ref 0–35)
AST: 13 U/L (ref 0–37)
Albumin: 4.3 g/dL (ref 3.5–5.2)
Alkaline Phosphatase: 85 U/L (ref 39–117)
BILIRUBIN TOTAL: 0.4 mg/dL (ref 0.2–1.2)
BUN: 14 mg/dL (ref 6–23)
CALCIUM: 9.9 mg/dL (ref 8.4–10.5)
CO2: 29 meq/L (ref 19–32)
Chloride: 103 mEq/L (ref 96–112)
Creatinine, Ser: 0.59 mg/dL (ref 0.40–1.20)
GFR: 129.58 mL/min (ref >60.00)
GLUCOSE: 85 mg/dL (ref 70–99)
POTASSIUM: 3.8 meq/L (ref 3.5–5.1)
Sodium: 141 mEq/L (ref 135–145)
Total Protein: 7.9 g/dL (ref 6.0–8.3)

## 2014-10-30 LAB — HEPATITIS C ANTIBODY: HCV AB: NEGATIVE

## 2014-10-30 NOTE — Assessment & Plan Note (Signed)
Benign appearing, no systemic sx, should resolve.  Okay for outpatient fu.

## 2014-10-30 NOTE — Assessment & Plan Note (Signed)
Likely from compensation, d/w pt.  Rest, ice and fu prn.  She agrees.

## 2014-10-30 NOTE — Assessment & Plan Note (Signed)
Likely meniscal sx, with occ locking or "getting stuck".  ROM intact today but with some discomfort.  D/w pt.  At this point, would be reasonable for ice/nsaids/wrap/cane.  If not better, then she'll likely need imaging and possible ortho referral.  She wants to give this some time.  That is reasonable.  Update me as needed.  Dx and path/phys d/w pt.

## 2014-10-31 ENCOUNTER — Telehealth: Payer: Self-pay | Admitting: Family Medicine

## 2014-10-31 NOTE — Telephone Encounter (Signed)
Pt returned your call - please call back at [phone] Thank you

## 2014-10-31 NOTE — Telephone Encounter (Signed)
See Result Note.

## 2015-01-27 ENCOUNTER — Telehealth: Payer: Self-pay | Admitting: Family Medicine

## 2015-01-27 MED ORDER — BENZONATATE 200 MG PO CAPS: 200.0000 mg | ORAL_CAPSULE | Freq: Three times a day (TID) | ORAL | Status: DC | PRN

## 2015-01-27 NOTE — Telephone Encounter (Signed)
Pt called to verify how to take tessalon; reviewed instructions from med list and instructions from Dr Josefine Class note. Pt voiced understanding.

## 2015-01-27 NOTE — Telephone Encounter (Signed)
Pt called she has a hacking cough no fever   She wanted to know if you could call her in something for this or is there something you could recommend she has been taking  Coricidin hdp  Delsym  Noting seems to help her Please advise

## 2015-01-27 NOTE — Telephone Encounter (Signed)
Tessalon sent.  Lots of fluids.  If not better or if worsening/febrile, then needs f/u.  Thanks.

## 2015-01-27 NOTE — Telephone Encounter (Signed)
Patient advised.

## 2015-03-20 ENCOUNTER — Telehealth: Payer: Self-pay | Admitting: Family Medicine

## 2015-03-20 DIAGNOSIS — M25569 Pain in unspecified knee: Secondary | ICD-10-CM

## 2015-03-20 NOTE — Telephone Encounter (Signed)
Ordered. Thanks

## 2015-03-20 NOTE — Telephone Encounter (Signed)
Patient was told to call when she was ready to be referred to someone for her knee.  Patient prefers [place].  Patient prefers a late afternoon appointment.

## 2015-03-26 DIAGNOSIS — M25562 Pain in left knee: Secondary | ICD-10-CM | POA: Diagnosis not present

## 2015-03-26 DIAGNOSIS — M25561 Pain in right knee: Secondary | ICD-10-CM | POA: Diagnosis not present

## 2015-04-14 ENCOUNTER — Encounter (HOSPITAL_COMMUNITY): Payer: Self-pay | Admitting: *Deleted

## 2015-04-14 ENCOUNTER — Telehealth: Payer: Self-pay | Admitting: Family Medicine

## 2015-04-14 ENCOUNTER — Emergency Department (HOSPITAL_COMMUNITY)
Admission: EM | Admit: 2015-04-14 | Discharge: 2015-04-14 | Disposition: A | Discharge status: Home / Self Care | Payer: Medicare Other | Attending: Emergency Medicine | Admitting: Emergency Medicine

## 2015-04-14 ENCOUNTER — Emergency Department (HOSPITAL_COMMUNITY): Payer: Medicare Other

## 2015-04-14 DIAGNOSIS — Z7982 Long term (current) use of aspirin: Secondary | ICD-10-CM | POA: Insufficient documentation

## 2015-04-14 DIAGNOSIS — I10 Essential (primary) hypertension: Secondary | ICD-10-CM | POA: Insufficient documentation

## 2015-04-14 DIAGNOSIS — R011 Cardiac murmur, unspecified: Secondary | ICD-10-CM | POA: Diagnosis not present

## 2015-04-14 DIAGNOSIS — E7800 Pure hypercholesterolemia, unspecified: Secondary | ICD-10-CM | POA: Insufficient documentation

## 2015-04-14 DIAGNOSIS — Z8669 Personal history of other diseases of the nervous system and sense organs: Secondary | ICD-10-CM | POA: Diagnosis not present

## 2015-04-14 DIAGNOSIS — K2970 Gastritis, unspecified, without bleeding: Secondary | ICD-10-CM | POA: Diagnosis not present

## 2015-04-14 DIAGNOSIS — Z86010 Personal history of colonic polyps: Secondary | ICD-10-CM | POA: Insufficient documentation

## 2015-04-14 DIAGNOSIS — Z79899 Other long term (current) drug therapy: Secondary | ICD-10-CM | POA: Insufficient documentation

## 2015-04-14 DIAGNOSIS — R1013 Epigastric pain: Secondary | ICD-10-CM | POA: Diagnosis present

## 2015-04-14 DIAGNOSIS — Z87442 Personal history of urinary calculi: Secondary | ICD-10-CM | POA: Insufficient documentation

## 2015-04-14 DIAGNOSIS — M810 Age-related osteoporosis without current pathological fracture: Secondary | ICD-10-CM | POA: Insufficient documentation

## 2015-04-14 LAB — COMPREHENSIVE METABOLIC PANEL
ALBUMIN: 3.6 g/dL (ref 3.5–5.0)
ALK PHOS: 71 U/L (ref 38–126)
ALT: 12 U/L — ABNORMAL LOW (ref 14–54)
ANION GAP: 8 (ref 5–15)
AST: 13 U/L — ABNORMAL LOW (ref 15–41)
BUN: 13 mg/dL (ref 6–20)
CALCIUM: 9.1 mg/dL (ref 8.9–10.3)
CO2: 25 mmol/L (ref 22–32)
Chloride: 110 mmol/L (ref 101–111)
Creatinine, Ser: 0.63 mg/dL (ref 0.44–1.00)
GFR calc non Af Amer: >60 mL/min (ref >60)
GLUCOSE: 122 mg/dL — AB (ref 65–99)
POTASSIUM: 4 mmol/L (ref 3.5–5.1)
SODIUM: 143 mmol/L (ref 135–145)
Total Bilirubin: 0.5 mg/dL (ref 0.3–1.2)
Total Protein: 6.7 g/dL (ref 6.5–8.1)

## 2015-04-14 LAB — URINALYSIS, ROUTINE W REFLEX MICROSCOPIC
BILIRUBIN URINE: NEGATIVE
Glucose, UA: NEGATIVE mg/dL
HGB URINE DIPSTICK: NEGATIVE
Ketones, ur: NEGATIVE mg/dL
Leukocytes, UA: NEGATIVE
Nitrite: NEGATIVE
PROTEIN: NEGATIVE mg/dL
Specific Gravity, Urine: 1.023 (ref 1.005–1.030)
pH: 6 (ref 5.0–8.0)

## 2015-04-14 LAB — CBC
HCT: 39.8 % (ref 36.0–46.0)
Hemoglobin: 13.4 g/dL (ref 12.0–15.0)
MCH: 29.4 pg (ref 26.0–34.0)
MCHC: 33.7 g/dL (ref 30.0–36.0)
MCV: 87.3 fL (ref 78.0–100.0)
PLATELETS: 244 10*3/uL (ref 150–400)
RBC: 4.56 MIL/uL (ref 3.87–5.11)
RDW: 13.7 % (ref 11.5–15.5)
WBC: 10.1 10*3/uL (ref 4.0–10.5)

## 2015-04-14 LAB — I-STAT TROPONIN, ED: TROPONIN I, POC: 0.01 ng/mL (ref 0.00–0.08)

## 2015-04-14 LAB — LIPASE, BLOOD: LIPASE: 29 U/L (ref 11–51)

## 2015-04-14 MED ORDER — PANTOPRAZOLE SODIUM 40 MG PO TBEC
40.0000 mg | DELAYED_RELEASE_TABLET | Freq: Once | ORAL | Status: AC
  Administered 2015-04-14: 40 mg via ORAL
  Filled 2015-04-14: qty 1

## 2015-04-14 MED ORDER — PANTOPRAZOLE SODIUM 20 MG PO TBEC: 20.0000 mg | DELAYED_RELEASE_TABLET | Freq: Every day | ORAL | Status: DC

## 2015-04-14 NOTE — ED Notes (Signed)
Pt ambulated to room, and restroom.

## 2015-04-14 NOTE — ED Notes (Signed)
Pt reports lower chest/epigastric pain that she describes as "hunger pains." pt having nausea but denies vomiting or diarrhea. ekg done at triage, airway intact.

## 2015-04-14 NOTE — ED Provider Notes (Signed)
CSN: [id number]     Arrival date & time 04/14/15  1029 History   First MD Initiated Contact with Patient 04/14/15 1323     Chief Complaint  Patient presents with  . Abdominal Pain     (Consider location/radiation/quality/duration/timing/severity/associated sxs/prior Treatment) HPI Patient is at 3 weeks of burning epigastric pain. She reports that it feels like "a "hunger pains. Will start in her epigastrium and then moved down to her mid abdomen. As been coming and going. She has not had any vomiting. she reports that dairy products seem to improve the pain.  Also she is got some relief taking Tums. A reports calling her doctor and describing the pain to make an appointment. There reported she should be seen at the emergency department. No chest pain or shortness of breath. No syncope. Symptoms started about 3 weeks ago when she got a steroid injection in her knee. She reports that she has taken one dose of ibuprofen but is not taking it regularly. Past Medical History  Diagnosis Date  . Hypertension   . High cholesterol   . Arrhythmia     possible hx, resolved prev  . Hx of colonic polyps   . Renal stones   . Osteoporosis 2010    t score - 3.9  . Carpal tunnel syndrome   . H/O exercise stress test 2012    normal  . Blood transfusion without reported diagnosis 1972    had reaction; had to stop  . Heart murmur     as child   Past Surgical History  Procedure Laterality Date  . Abdominal hysterectomy  1972    for endometriosis  . Lithotripsy      for kidney stone x5   Family History  Problem Relation Age of Onset  . Heart disease Mother   . Renal Disease Mother   . Heart failure Mother   . Colon cancer Neg Hx   . Breast cancer Neg Hx    Social History  Substance Use Topics  . Smoking status: Never Smoker   . Smokeless tobacco: Never Used  . Alcohol Use: Yes     Comment: occasional   OB History    No data available     Review of Systems 10 Systems reviewed and are  negative for acute change except as noted in the HPI.    Allergies  Apple; Bisphosphonates; and Prolia  Home Medications   Prior to Admission medications   Medication Sig Start Date End Date Taking? Authorizing Provider  aspirin 81 MG tablet Take 81 mg by mouth daily.    Historical Provider, MD  benzonatate (TESSALON) 200 MG capsule Take 1 capsule (200 mg total) by mouth 3 (three) times daily as needed. 01/27/15   Tonia Ghent, MD  cholecalciferol (VITAMIN D) 1000 UNITS tablet Take 1,000 Units by mouth daily.    Historical Provider, MD  ibuprofen (ADVIL,MOTRIN) 800 MG tablet Take 1 tablet (800 mg total) by mouth 3 (three) times daily as needed (with food.). 10/25/14   Tonia Ghent, MD  lisinopril-hydrochlorothiazide (PRINZIDE,ZESTORETIC) 10-12.5 MG tablet Take 1 tablet by mouth daily. Dispense med produced by [facility] 10/25/14   Tonia Ghent, MD  pantoprazole (PROTONIX) 20 MG tablet Take 1 tablet (20 mg total) by mouth daily. 04/14/15   Charlesetta Shanks, MD  simvastatin (ZOCOR) 10 MG tablet Take 1 tablet (10 mg total) by mouth every other day. 10/25/14   Tonia Ghent, MD   BP 148/90 mmHg  Pulse 67  Temp(Src) 98 F (36.7 C) (Oral)  Resp 16  SpO2 100% Physical Exam  Constitutional: She is oriented to person, place, and time. She appears well-developed and well-nourished.  HENT:  Head: Normocephalic and atraumatic.  Eyes: EOM are normal. Pupils are equal, round, and reactive to light.  Neck: Neck supple.  Cardiovascular: Normal rate, regular rhythm, normal heart sounds and intact distal pulses.   Pulmonary/Chest: Effort normal and breath sounds normal.  Abdominal: Soft. Bowel sounds are normal. She exhibits no distension. There is no tenderness.  Musculoskeletal: Normal range of motion. She exhibits no edema.  Neurological: She is alert and oriented to person, place, and time. She has normal strength. Coordination normal. GCS eye subscore is 4. GCS verbal subscore is 5. GCS motor  subscore is 6.  Skin: Skin is warm, dry and intact.  Psychiatric: She has a normal mood and affect.    ED Course  Procedures (including critical care time) Labs Review Labs Reviewed  COMPREHENSIVE METABOLIC PANEL - Abnormal; Notable for the following:    Glucose, Bld 122 (*)    AST 13 (*)    ALT 12 (*)    All other components within normal limits  LIPASE, BLOOD  CBC  URINALYSIS, ROUTINE W REFLEX MICROSCOPIC (NOT AT [facility])  Randolm Idol, ED    Imaging Review Dg Chest 2 View  04/14/2015  CLINICAL DATA:  Chest pain for 2 weeks EXAM: CHEST  2 VIEW COMPARISON:  April 05, 2012 FINDINGS: There is mild atelectasis in the left mid lung region. There is no edema or consolidation. Heart is mildly enlarged with pulmonary vascularity within normal limits. No adenopathy. No bone lesions. IMPRESSION: Mild left midlung atelectasis. No edema or consolidation. Stable cardiac prominence. Electronically Signed   By: Lowella Grip III M.D.   On: 04/14/2015 11:32   I have personally reviewed and evaluated these images and lab results as part of my medical decision-making.   EKG Interpretation   Date/Time:  Monday April 14 2015 10:44:17 EDT Ventricular Rate:  71 PR Interval:  168 QRS Duration: 76 QT Interval:  396 QTC Calculation: 430 R Axis:   -8 Text Interpretation:  Normal sinus rhythm Normal ECG agree Confirmed by  Johnney Killian, MD, Jeannie Done [phone]) on 04/14/2015 1:41:12 PM      MDM   Final diagnoses:  Gastritis   History is very suggestive of gastritis. Patient does not have dyspnea or chest pain. Diagnostic workup is normal with normal EKG, and negative troponin and normal lab values. Patient was started on proton ex with instructions to follow-up with her family physician.    Charlesetta Shanks, MD 04/14/15 1410

## 2015-04-14 NOTE — Discharge Instructions (Signed)
Gastritis, Adult Gastritis is soreness and swelling (inflammation) of the lining of the stomach. Gastritis can develop as a sudden onset (acute) or long-term (chronic) condition. If gastritis is not treated, it can lead to stomach bleeding and ulcers. CAUSES  Gastritis occurs when the stomach lining is weak or damaged. Digestive juices from the stomach then inflame the weakened stomach lining. The stomach lining may be weak or damaged due to viral or bacterial infections. One common bacterial infection is the Helicobacter pylori infection. Gastritis can also result from excessive alcohol consumption, taking certain medicines, or having too much acid in the stomach.  SYMPTOMS  In some cases, there are no symptoms. When symptoms are present, they may include:  Pain or a burning sensation in the upper abdomen.  Nausea.  Vomiting.  An uncomfortable feeling of fullness after eating. DIAGNOSIS  Your caregiver may suspect you have gastritis based on your symptoms and a physical exam. To determine the cause of your gastritis, your caregiver may perform the following:  Blood or stool tests to check for the H pylori bacterium.  Gastroscopy. A thin, flexible tube (endoscope) is passed down the esophagus and into the stomach. The endoscope has a light and camera on the end. Your caregiver uses the endoscope to view the inside of the stomach.  Taking a tissue sample (biopsy) from the stomach to examine under a microscope. TREATMENT  Depending on the cause of your gastritis, medicines may be prescribed. If you have a bacterial infection, such as an H pylori infection, antibiotics may be given. If your gastritis is caused by too much acid in the stomach, H2 blockers or antacids may be given. Your caregiver may recommend that you stop taking aspirin, ibuprofen, or other nonsteroidal anti-inflammatory drugs (NSAIDs). HOME CARE INSTRUCTIONS  Only take over-the-counter or prescription medicines as directed by  your caregiver.  If you were given antibiotic medicines, take them as directed. Finish them even if you start to feel better.  Drink enough fluids to keep your urine clear or pale yellow.  Avoid foods and drinks that make your symptoms worse, such as:  Caffeine or alcoholic drinks.  Chocolate.  Peppermint or mint flavorings.  Garlic and onions.  Spicy foods.  Citrus fruits, such as oranges, lemons, or limes.  Tomato-based foods such as sauce, chili, salsa, and pizza.  Fried and fatty foods.  Eat small, frequent meals instead of large meals. SEEK IMMEDIATE MEDICAL CARE IF:   You have black or dark red stools.  You vomit blood or material that looks like coffee grounds.  You are unable to keep fluids down.  Your abdominal pain gets worse.  You have a fever.  You do not feel better after 1 week.  You have any other questions or concerns. MAKE SURE YOU:  Understand these instructions.  Will watch your condition.  Will get help right away if you are not doing well or get worse.   This information is not intended to replace advice given to you by your health care provider. Make sure you discuss any questions you have with your health care provider.   Document Released: 12/29/2000 Document Revised: 07/06/2011 Document Reviewed: 02/17/2011 Elsevier Interactive Patient Education [employer].  Esophagogastroduodenoscopy This is a test you may need to further examine your stomach and esophagus. Discuss this with your physician. Esophagogastroduodenoscopy (EGD) is a procedure that is used to examine the lining of the esophagus, stomach, and first part of the small intestine (duodenum). A long, flexible, lighted tube  with a camera attached (endoscope) is inserted down the throat to view these organs. This procedure is done to detect problems or abnormalities, such as inflammation, bleeding, ulcers, or growths, in order to treat them. The procedure lasts 5-20 minutes.  It is usually an outpatient procedure, but it may need to be performed in a hospital in emergency cases. LET [facility] CARE PROVIDER KNOW ABOUT:  Any allergies you have.  All medicines you are taking, including vitamins, herbs, eye drops, creams, and over-the-counter medicines.  Previous problems you or members of your family have had with the use of anesthetics.  Any blood disorders you have.  Previous surgeries you have had.  Medical conditions you have. RISKS AND COMPLICATIONS Generally, this is a safe procedure. However, problems can occur and include:  Infection.  Bleeding.  Tearing (perforation) of the esophagus, stomach, or duodenum.  Difficulty breathing or not being able to breathe.  Excessive sweating.  Spasms of the larynx.  Slowed heartbeat.  Low blood pressure. BEFORE THE PROCEDURE  Do not eat or drink anything after midnight on the night before the procedure or as directed by your health care provider.  Do not take your regular medicines before the procedure if your health care provider asks you not to. Ask your health care provider about changing or stopping those medicines.  If you wear dentures, be prepared to remove them before the procedure.  Arrange for someone to drive you home after the procedure. PROCEDURE  A numbing medicine (local anesthetic) may be sprayed in your throat for comfort and to stop you from gagging or coughing.  You will have an IV tube inserted in a vein in your hand or arm. You will receive medicines and fluids through this tube.  You will be given a medicine to relax you (sedative).  A pain reliever will be given through the IV tube.  A mouth guard may be placed in your mouth to protect your teeth and to keep you from biting on the endoscope.  You will be asked to lie on your left side.  The endoscope will be inserted down your throat and into your esophagus, stomach, and duodenum.  Air will be put through the  endoscope to allow your health care provider to clearly view the lining of your esophagus.  The lining of your esophagus, stomach, and duodenum will be examined. During the exam, your health care provider may:  Remove tissue to be examined under a microscope (biopsy) for inflammation, infection, or other medical problems.  Remove growths.  Remove objects (foreign bodies) that are stuck.  Treat any bleeding with medicines or other devices that stop tissues from bleeding (hot cautery, clipping devices).  Widen (dilate) or stretch narrowed areas of your esophagus and stomach.  The endoscope will be withdrawn. AFTER THE PROCEDURE  You will be taken to a recovery area for observation. Your blood pressure, heart rate, breathing rate, and blood oxygen level will be monitored often until the medicines you were given have worn off.  Do not eat or drink anything until the numbing medicine has worn off and your gag reflex has returned. You may choke.  Your health care provider should be able to discuss his or her findings with you. It will take longer to discuss the test results if any biopsies were taken.   This information is not intended to replace advice given to you by your health care provider. Make sure you discuss any questions you have with your health care  provider.   Document Released: 05/07/2004 Document Revised: 01/25/2014 Document Reviewed: 12/08/2011 Elsevier Interactive Patient Education [employer].

## 2015-04-14 NOTE — Telephone Encounter (Signed)
Patient Name: Patricia Hoover DOB: April 18, 1945 Initial Comment Caller states she is having chest pain. Nurse Assessment Nurse: Ronnald Ramp, RN, [place] Date/Time (Eastern Time): 04/14/2015 8:12:38 AM Confirm and document reason for call. If symptomatic, describe symptoms. You must click the next button to save text entered. ---Caller states she is having upper abdominal pain off and on for several weeks. Denies vomiting or diarrhea. Has the patient traveled out of the country within the last 30 days? ---No Does the patient have any new or worsening symptoms? ---Yes Will a triage be completed? ---Yes Related visit to physician within the last 2 weeks? ---No Does the PT have any chronic conditions? (i.e. diabetes, asthma, etc.) ---Yes List chronic conditions. ---HTN Is this a behavioral health or substance abuse call? ---No Guidelines Guideline Title Affirmed Question Affirmed Notes Abdominal Pain - Upper [1] Pain lasts > 10 minutes AND [2] age > 73 Final Disposition User Go to ED Now Ronnald Ramp, RN, [facility] - ED Disagree/Comply: Comply

## 2015-07-04 ENCOUNTER — Other Ambulatory Visit: Payer: Self-pay | Admitting: Family Medicine

## 2015-07-04 ENCOUNTER — Other Ambulatory Visit: Payer: Self-pay

## 2015-07-04 DIAGNOSIS — Z1231 Encounter for screening mammogram for malignant neoplasm of breast: Secondary | ICD-10-CM

## 2015-08-06 ENCOUNTER — Telehealth: Payer: Self-pay | Admitting: Family Medicine

## 2015-08-06 NOTE — Telephone Encounter (Signed)
1000 IU is reasonable.  Thanks.

## 2015-08-06 NOTE — Telephone Encounter (Signed)
Pt called. Would like to verify how many units of Vid D she is supposed to be taking? I informed pt her med list stated 1000 units, but pt wants to make sure that is right?  Please advise

## 2015-08-07 NOTE — Telephone Encounter (Signed)
Left detailed message on voicemail.  

## 2015-08-20 ENCOUNTER — Ambulatory Visit
Admission: RE | Admit: 2015-08-20 | Discharge: 2015-08-20 | Disposition: A | Discharge status: Home / Self Care | Payer: Medicare Other | Source: Ambulatory Visit | Attending: Family Medicine | Admitting: Family Medicine

## 2015-08-20 DIAGNOSIS — Z1231 Encounter for screening mammogram for malignant neoplasm of breast: Secondary | ICD-10-CM

## 2015-11-24 ENCOUNTER — Ambulatory Visit: Payer: Medicare Other

## 2015-12-09 ENCOUNTER — Other Ambulatory Visit: Payer: Self-pay | Admitting: Family Medicine

## 2015-12-09 DIAGNOSIS — I10 Essential (primary) hypertension: Secondary | ICD-10-CM

## 2015-12-09 DIAGNOSIS — M810 Age-related osteoporosis without current pathological fracture: Secondary | ICD-10-CM

## 2015-12-10 ENCOUNTER — Other Ambulatory Visit: Payer: Medicare Other

## 2015-12-10 ENCOUNTER — Ambulatory Visit (INDEPENDENT_AMBULATORY_CARE_PROVIDER_SITE_OTHER): Payer: Medicare Other

## 2015-12-10 VITALS — BP 120/84 | HR 66 | Temp 98.7°F | Ht <= 58 in | Wt 168.5 lb

## 2015-12-10 DIAGNOSIS — Z23 Encounter for immunization: Secondary | ICD-10-CM

## 2015-12-10 DIAGNOSIS — Z0000 Encounter for general adult medical examination without abnormal findings: Secondary | ICD-10-CM

## 2015-12-10 DIAGNOSIS — M810 Age-related osteoporosis without current pathological fracture: Secondary | ICD-10-CM | POA: Diagnosis not present

## 2015-12-10 DIAGNOSIS — I10 Essential (primary) hypertension: Secondary | ICD-10-CM

## 2015-12-10 LAB — COMPREHENSIVE METABOLIC PANEL
ALK PHOS: 79 U/L (ref 39–117)
ALT: 10 U/L (ref 0–35)
AST: 12 U/L (ref 0–37)
Albumin: 4 g/dL (ref 3.5–5.2)
BUN: 15 mg/dL (ref 6–23)
CO2: 29 meq/L (ref 19–32)
Calcium: 9.2 mg/dL (ref 8.4–10.5)
Chloride: 105 mEq/L (ref 96–112)
Creatinine, Ser: 0.67 mg/dL (ref 0.40–1.20)
GFR: 111.53 mL/min (ref >60.00)
GLUCOSE: 115 mg/dL — AB (ref 70–99)
POTASSIUM: 3.9 meq/L (ref 3.5–5.1)
Sodium: 143 mEq/L (ref 135–145)
Total Bilirubin: 0.4 mg/dL (ref 0.2–1.2)
Total Protein: 6.9 g/dL (ref 6.0–8.3)

## 2015-12-10 LAB — LIPID PANEL
CHOL/HDL RATIO: 4
Cholesterol: 192 mg/dL (ref 0–200)
HDL: 45.4 mg/dL (ref >39.00)
LDL Cholesterol: 120 mg/dL — ABNORMAL HIGH (ref 0–99)
NONHDL: 146.23
Triglycerides: 132 mg/dL (ref 0.0–149.0)
VLDL: 26.4 mg/dL (ref 0.0–40.0)

## 2015-12-10 LAB — VITAMIN D 25 HYDROXY (VIT D DEFICIENCY, FRACTURES): VITD: 28.68 ng/mL — ABNORMAL LOW (ref 30.00–100.00)

## 2015-12-10 NOTE — Progress Notes (Signed)
Pre visit review using our clinic review tool, if applicable. No additional management support is needed unless otherwise documented below in the visit note. 

## 2015-12-10 NOTE — Patient Instructions (Signed)
Patricia Hoover , Thank you for taking time to come for your Medicare Wellness Visit. I appreciate your ongoing commitment to your health goals. Please review the following plan we discussed and let me know if I can assist you in the future.   These are the goals we discussed: Goals    . Increase physical activity          Starting 12/10/2015, I will continue to do dancing for at least 60 min 2 days per week.        This is a list of the screening recommended for you and due dates:  Health Maintenance  Topic Date Due  . Mammogram  08/19/2017  . Colon Cancer Screening  09/18/2019  . Tetanus Vaccine  07/05/2021  . Flu Shot  Completed  . DEXA scan (bone density measurement)  Completed  . Shingles Vaccine  Completed  .  Hepatitis C: One time screening is recommended by Center for Disease Control  (CDC) for  adults born from [birth year] through 1966.   Completed  . Pneumonia vaccines  Completed   Preventive Care for Adults  A healthy lifestyle and preventive care can promote health and wellness. Preventive health guidelines for adults include the following key practices.  . A routine yearly physical is a good way to check with your health care provider about your health and preventive screening. It is a chance to share any concerns and updates on your health and to receive a thorough exam.  . Visit your dentist for a routine exam and preventive care every 6 months. Brush your teeth twice a day and floss once a day. Good oral hygiene prevents tooth decay and gum disease.  . The frequency of eye exams is based on your age, health, family medical history, use  of contact lenses, and other factors. Follow your health care provider's ecommendations for frequency of eye exams.  . Eat a healthy diet. Foods like vegetables, fruits, whole grains, low-fat dairy products, and lean protein foods contain the nutrients you need without too many calories. Decrease your intake of foods high in solid fats, added  sugars, and salt. Eat the right amount of calories for you. Get information about a proper diet from your health care provider, if necessary.  . Regular physical exercise is one of the most important things you can do for your health. Most adults should get at least 150 minutes of moderate-intensity exercise (any activity that increases your heart rate and causes you to sweat) each week. In addition, most adults need muscle-strengthening exercises on 2 or more days a week.  Silver Sneakers may be a benefit available to you. To determine eligibility, you may visit the website: www.silversneakers.com or contact program at (606)031-5218 Mon-Fri between 8AM-8PM.   . Maintain a healthy weight. The body mass index (BMI) is a screening tool to identify possible weight problems. It provides an estimate of body fat based on height and weight. Your health care provider can find your BMI and can help you achieve or maintain a healthy weight.   For adults 20 years and older: ? A BMI below 18.5 is considered underweight. ? A BMI of 18.5 to 24.9 is normal. ? A BMI of 25 to 29.9 is considered overweight. ? A BMI of 30 and above is considered obese.   . Maintain normal blood lipids and cholesterol levels by exercising and minimizing your intake of saturated fat. Eat a balanced diet with plenty of fruit and vegetables. Blood tests for  lipids and cholesterol should begin at age 62 and be repeated every 5 years. If your lipid or cholesterol levels are high, you are over 50, or you are at high risk for heart disease, you may need your cholesterol levels checked more frequently. Ongoing high lipid and cholesterol levels should be treated with medicines if diet and exercise are not working.  . If you smoke, find out from your health care provider how to quit. If you do not use tobacco, please do not start.  . If you choose to drink alcohol, please do not consume more than 2 drinks per day. One drink is considered to  be 12 ounces (355 mL) of beer, 5 ounces (148 mL) of wine, or 1.5 ounces (44 mL) of liquor.  . If you are 14-80 years old, ask your health care provider if you should take aspirin to prevent strokes.  . Use sunscreen. Apply sunscreen liberally and repeatedly throughout the day. You should seek shade when your shadow is shorter than you. Protect yourself by wearing long sleeves, pants, a wide-brimmed hat, and sunglasses year round, whenever you are outdoors.  . Once a month, do a whole body skin exam, using a mirror to look at the skin on your back. Tell your health care provider of new moles, moles that have irregular borders, moles that are larger than a pencil eraser, or moles that have changed in shape or color.

## 2015-12-10 NOTE — Progress Notes (Signed)
Subjective:   Patricia Hoover is a 70 y.o. female who presents for Medicare Annual (Subsequent) preventive examination.  Review of Systems:  N/A Cardiac Risk Factors include: advanced age (>79men, >27 women);obesity (BMI >30kg/m2);dyslipidemia;hypertension     Objective:     Vitals: BP 120/84 (BP Location: Left Arm, Patient Position: Sitting, Cuff Size: Normal)   Pulse 66   Temp 98.7 F (37.1 C) (Oral)   Ht 4' 9.75" (1.467 m) Comment: no shoes  Wt 168 lb 8 oz (76.4 kg)   SpO2 96%   BMI 35.52 kg/m   Body mass index is 35.52 kg/m.   Tobacco History  Smoking Status  . Former Smoker  . Types: Cigarettes  . Quit date: 01/19/1991  Smokeless Tobacco  . Never Used     Counseling given: No   Past Medical History:  Diagnosis Date  . Arrhythmia    possible hx, resolved prev  . Blood transfusion without reported diagnosis 1972   had reaction; had to stop  . Carpal tunnel syndrome   . H/O exercise stress test 2012   normal  . Heart murmur    as child  . High cholesterol   . Hx of colonic polyps   . Hypertension   . Osteoporosis 2010   t score - 3.9  . Renal stones    Past Surgical History:  Procedure Laterality Date  . ABDOMINAL HYSTERECTOMY  1972   for endometriosis  . LITHOTRIPSY     for kidney stone x5   Family History  Problem Relation Age of Onset  . Heart disease Mother   . Renal Disease Mother   . Heart failure Mother   . Colon cancer Neg Hx   . Breast cancer Neg Hx    History  Sexual Activity  . Sexual activity: No    Outpatient Encounter Prescriptions as of 12/10/2015  Medication Sig  . aspirin 81 MG tablet Take 81 mg by mouth daily.  . cholecalciferol (VITAMIN D) 1000 UNITS tablet Take 1,000 Units by mouth daily.  Marland Kitchen ibuprofen (ADVIL,MOTRIN) 800 MG tablet Take 1 tablet (800 mg total) by mouth 3 (three) times daily as needed (with food.).  Marland Kitchen lisinopril-hydrochlorothiazide (PRINZIDE,ZESTORETIC) 10-12.5 MG tablet Take 1 tablet by mouth daily.  Dispense med produced by [employer]  . simvastatin (ZOCOR) 10 MG tablet Take 1 tablet (10 mg total) by mouth every other day.  . [DISCONTINUED] benzonatate (TESSALON) 200 MG capsule Take 1 capsule (200 mg total) by mouth 3 (three) times daily as needed.  . [DISCONTINUED] pantoprazole (PROTONIX) 20 MG tablet Take 1 tablet (20 mg total) by mouth daily.   No facility-administered encounter medications on file as of 12/10/2015.     Activities of Daily Living In your present state of health, do you have any difficulty performing the following activities: 12/10/2015  Hearing? N  Vision? N  Difficulty concentrating or making decisions? N  Walking or climbing stairs? Y  Dressing or bathing? N  Doing errands, shopping? N  Preparing Food and eating ? N  Using the Toilet? N  In the past six months, have you accidently leaked urine? N  Do you have problems with loss of bowel control? N  Managing your Medications? N  Managing your Finances? N  Housekeeping or managing your Housekeeping? N  Some recent data might be hidden    Patient Care Team: Tonia Ghent, MD as PCP - General (Family Medicine) Bryson Ha, OD as Consulting Physician (Optometry)    Assessment:  Hearing Screening   125Hz  250Hz  500Hz  1000Hz  2000Hz  3000Hz  4000Hz  6000Hz  8000Hz   Right ear:   40 0 40  0    Left ear:   40 0 40  40    Vision Screening Comments: Last vision exam in 2017 with Dr. Phineas Douglas   Exercise Activities and Dietary recommendations Current Exercise Habits: Structured exercise class, Type of exercise: Other - see comments (formal dancing), Time (Minutes): 60, Frequency (Times/Week): 2, Weekly Exercise (Minutes/Week): 120, Intensity: Moderate, Exercise limited by: None identified  Goals    . Increase physical activity          Starting 12/10/2015, I will continue to do dancing for at least 60 min 2 days per week.       Fall Risk Fall Risk  12/10/2015 10/25/2014 06/04/2013  Falls in the past  year? No No No   Depression Screen PHQ 2/9 Scores 12/10/2015 10/25/2014 06/04/2013  PHQ - 2 Score 0 0 0     Cognitive Function MMSE - Mini Mental State Exam 12/10/2015  Orientation to time 5  Orientation to Place 5  Registration 3  Attention/ Calculation 0  Recall 3  Language- name 2 objects 0  Language- repeat 1  Language- follow 3 step command 3  Language- read & follow direction 0  Write a sentence 0  Copy design 0  Total score 20     PLEASE NOTE: A Mini-Cog screen was completed. Maximum score is 20. A value of 0 denotes this part of Folstein MMSE was not completed or the patient failed this part of the Mini-Cog screening.   Mini-Cog Screening Orientation to Time - Max 5 pts Orientation to Place - Max 5 pts Registration - Max 3 pts Recall - Max 3 pts Language Repeat - Max 1 pts Language Follow 3 Step Command - Max 3 pts     Immunization History  Administered Date(s) Administered  . Influenza,inj,Quad PF,36+ Mos 10/25/2014, 12/10/2015  . Influenza-Unspecified 10/10/2013  . Pneumococcal Conjugate-13 10/25/2014  . Pneumococcal Polysaccharide-23 06/04/2013  . Tdap 07/06/2011  . Zoster 07/06/2011   Screening Tests Health Maintenance  Topic Date Due  . MAMMOGRAM  08/19/2017  . COLONOSCOPY  09/18/2019  . TETANUS/TDAP  07/05/2021  . INFLUENZA VACCINE  Completed  . DEXA SCAN  Completed  . ZOSTAVAX  Completed  . Hepatitis C Screening  Completed  . PNA vac Low Risk Adult  Completed      Plan:     I have personally reviewed and addressed the Medicare Annual Wellness questionnaire and have noted the following in the patient's chart:  A. Medical and social history B. Use of alcohol, tobacco or illicit drugs  C. Current medications and supplements D. Functional ability and status E.  Nutritional status F.  Physical activity G. Advance directives H. List of other physicians I.  Hospitalizations, surgeries, and ER visits in previous 12 months J.   [place] to include hearing, vision, cognitive, depression L. Referrals and appointments - none  In addition, I have reviewed and discussed with patient certain preventive protocols, quality metrics, and best practice recommendations. A written personalized care plan for preventive services as well as general preventive health recommendations were provided to patient.  See attached scanned questionnaire for additional information.   Signed,   Lindell Noe, MHA, BS, LPN Health Coach

## 2015-12-10 NOTE — Progress Notes (Signed)
PCP notes:   Health maintenance:  Flu vaccine - administered  Abnormal screenings:   Hearing - failed  Patient concerns:   Pt wants to discuss getting an EGD and discuss use of a PPI to manage GERD. Pt also wants to be assessed for cerumen impaction.   Nurse concerns:  None  Next PCP appt:   12/16/2015 @ 0845

## 2015-12-15 NOTE — Progress Notes (Signed)
I reviewed health advisor's note, was available for consultation, and agree with documentation and plan.  

## 2015-12-16 ENCOUNTER — Ambulatory Visit (INDEPENDENT_AMBULATORY_CARE_PROVIDER_SITE_OTHER): Payer: Medicare Other | Admitting: Family Medicine

## 2015-12-16 ENCOUNTER — Encounter: Payer: Self-pay | Admitting: Family Medicine

## 2015-12-16 VITALS — BP 122/72 | HR 68 | Temp 98.7°F | Ht <= 58 in | Wt 168.8 lb

## 2015-12-16 DIAGNOSIS — R739 Hyperglycemia, unspecified: Secondary | ICD-10-CM | POA: Diagnosis not present

## 2015-12-16 DIAGNOSIS — E785 Hyperlipidemia, unspecified: Secondary | ICD-10-CM

## 2015-12-16 DIAGNOSIS — R12 Heartburn: Secondary | ICD-10-CM

## 2015-12-16 DIAGNOSIS — M810 Age-related osteoporosis without current pathological fracture: Secondary | ICD-10-CM

## 2015-12-16 DIAGNOSIS — I10 Essential (primary) hypertension: Secondary | ICD-10-CM

## 2015-12-16 MED ORDER — PANTOPRAZOLE SODIUM 20 MG PO TBEC: 20.0000 mg | DELAYED_RELEASE_TABLET | Freq: Every day | ORAL | 1 refills | Status: DC

## 2015-12-16 MED ORDER — SIMVASTATIN 10 MG PO TABS: 10.0000 mg | ORAL_TABLET | ORAL | 3 refills | Status: DC

## 2015-12-16 MED ORDER — IBUPROFEN 800 MG PO TABS: 800.0000 mg | ORAL_TABLET | Freq: Three times a day (TID) | ORAL | 3 refills | Status: DC | PRN

## 2015-12-16 MED ORDER — VITAMIN D 1000 UNITS PO TABS: 2000.0000 [IU] | ORAL_TABLET | Freq: Every day | ORAL | Status: DC

## 2015-12-16 MED ORDER — LISINOPRIL-HYDROCHLOROTHIAZIDE 10-12.5 MG PO TABS: 1.0000 | ORAL_TABLET | Freq: Every day | ORAL | 3 refills | Status: DC

## 2015-12-16 NOTE — Progress Notes (Signed)
Pre visit review using our clinic review tool, if applicable. No additional management support is needed unless otherwise documented below in the visit note. 

## 2015-12-16 NOTE — Patient Instructions (Signed)
Increase vitamin D to 2000 units a day.  Add on protonix and update me if not better in about 1 week.  Use the eat right diet in the meantime.  Take care.  Glad to see you.

## 2015-12-16 NOTE — Progress Notes (Signed)
Hearing screening d/w pt.  Declined hearing aids.    She thought she was having heartburn.  Chest sx in the last month.  H/o normal stress test in the past.  Sx has sx at rest, lower sternal area.  Seems to be worse after eating.  She describes tightness, not pain.  Happening more often, lasting longer now, in the last few weeks.  Chest wall is sore to the touch.  Taking tums or drinking milk clearly helped, makes it resolve- this is a consistent result.  Taking ibuprofen prn, about once monthly or less.  Off protonix.    Hyperglycemia, labs, diet and exercise discussed with patient. Eat right handout given and discussed with patient.  Osteoporosis.  Intolerant to mult meds.  D/w pt.  DXA not useful at this point.  On vit D 1000 units daily.  Vit D slightly low.  D/w pt.   Hypertension:    Using medication without problems or lightheadedness: yes Chest pain with exertion: not consistently, see above.   Edema:no Short of breath:no Other issues: see above.    Elevated Cholesterol: Using medications without problems:yes Muscle aches: no Diet compliance: encouraged less fried foods, d/w pt.  Exercise: recently started back at the gym  Meds, vitals, and allergies reviewed.   PMH and SH reviewed  ROS: Per HPI unless specifically indicated in ROS section   GEN: nad, alert and oriented HEENT: mucous membranes moist NECK: supple w/o LA CV: rrr. PULM: ctab, no inc wob ABD: soft, +bs EXT: no edema SKIN: no acute rash

## 2015-12-17 DIAGNOSIS — R12 Heartburn: Secondary | ICD-10-CM | POA: Insufficient documentation

## 2015-12-17 NOTE — Assessment & Plan Note (Signed)
Hyperglycemia, labs, diet and exercise discussed with patient. Eat right handout given and discussed with patient.

## 2015-12-17 NOTE — Assessment & Plan Note (Signed)
She did want to go through with extra treatment and that means that repeat testing would not be indicated. Discussed with patient. I agree with patient given her previous med trials and intolerances. Vitamin D was slightly low. Increase replacement to 2000 units a day. Discussed with patient.

## 2015-12-17 NOTE — Assessment & Plan Note (Signed)
She clearly improved with TUMS and drinking milk. She doesn't have clear exertional symptoms. I don't think this is cardiac. Discussed with patient. She had previously been on PPI and had done well. Restart PPI and if not clearly better in the near future she will update me. She agrees. Okay for outpatient follow-up. >25 minutes spent in face to face time with patient, >50% spent in counselling or coordination of care.

## 2015-12-17 NOTE — Assessment & Plan Note (Signed)
Controlled. Continue current medications. Needs work on diet and exercise. Discussed with patient she agrees.

## 2015-12-17 NOTE — Assessment & Plan Note (Signed)
She can tolerate statin dosed every other day. Continue current medication as is. Needs work on diet and exercise. Labs discussed with patient. She agrees.

## 2016-01-31 ENCOUNTER — Emergency Department: Payer: Medicare Other

## 2016-01-31 ENCOUNTER — Encounter: Payer: Self-pay | Admitting: Emergency Medicine

## 2016-01-31 ENCOUNTER — Emergency Department
Admission: EM | Admit: 2016-01-31 | Discharge: 2016-01-31 | Disposition: A | Discharge status: Home / Self Care | Payer: Medicare Other | Attending: Emergency Medicine | Admitting: Emergency Medicine

## 2016-01-31 DIAGNOSIS — N200 Calculus of kidney: Secondary | ICD-10-CM | POA: Diagnosis not present

## 2016-01-31 DIAGNOSIS — Z79899 Other long term (current) drug therapy: Secondary | ICD-10-CM | POA: Diagnosis not present

## 2016-01-31 DIAGNOSIS — I10 Essential (primary) hypertension: Secondary | ICD-10-CM | POA: Insufficient documentation

## 2016-01-31 DIAGNOSIS — Z87891 Personal history of nicotine dependence: Secondary | ICD-10-CM | POA: Insufficient documentation

## 2016-01-31 DIAGNOSIS — N23 Unspecified renal colic: Secondary | ICD-10-CM | POA: Diagnosis not present

## 2016-01-31 DIAGNOSIS — Z7982 Long term (current) use of aspirin: Secondary | ICD-10-CM | POA: Diagnosis not present

## 2016-01-31 DIAGNOSIS — R109 Unspecified abdominal pain: Secondary | ICD-10-CM | POA: Diagnosis present

## 2016-01-31 DIAGNOSIS — N132 Hydronephrosis with renal and ureteral calculous obstruction: Secondary | ICD-10-CM | POA: Diagnosis not present

## 2016-01-31 LAB — CBC
HEMATOCRIT: 39.4 % (ref 35.0–47.0)
Hemoglobin: 13.5 g/dL (ref 12.0–16.0)
MCH: 29.5 pg (ref 26.0–34.0)
MCHC: 34.4 g/dL (ref 32.0–36.0)
MCV: 85.8 fL (ref 80.0–100.0)
PLATELETS: 252 10*3/uL (ref 150–440)
RBC: 4.59 MIL/uL (ref 3.80–5.20)
RDW: 14.3 % (ref 11.5–14.5)
WBC: 15.6 10*3/uL — AB (ref 3.6–11.0)

## 2016-01-31 LAB — COMPREHENSIVE METABOLIC PANEL
ALBUMIN: 4 g/dL (ref 3.5–5.0)
ALK PHOS: 84 U/L (ref 38–126)
ALT: 13 U/L — AB (ref 14–54)
AST: 23 U/L (ref 15–41)
Anion gap: 8 (ref 5–15)
BILIRUBIN TOTAL: 0.3 mg/dL (ref 0.3–1.2)
BUN: 19 mg/dL (ref 6–20)
CO2: 25 mmol/L (ref 22–32)
CREATININE: 0.67 mg/dL (ref 0.44–1.00)
Calcium: 9.3 mg/dL (ref 8.9–10.3)
Chloride: 108 mmol/L (ref 101–111)
GFR calc Af Amer: >60 mL/min (ref >60)
GLUCOSE: 166 mg/dL — AB (ref 65–99)
Potassium: 4.1 mmol/L (ref 3.5–5.1)
Sodium: 141 mmol/L (ref 135–145)
TOTAL PROTEIN: 7.3 g/dL (ref 6.5–8.1)

## 2016-01-31 LAB — URINALYSIS, COMPLETE (UACMP) WITH MICROSCOPIC
BACTERIA UA: NONE SEEN
Bilirubin Urine: NEGATIVE
Glucose, UA: NEGATIVE mg/dL
Ketones, ur: NEGATIVE mg/dL
Leukocytes, UA: NEGATIVE
NITRITE: NEGATIVE
PH: 6 (ref 5.0–8.0)
Protein, ur: 30 mg/dL — AB
Specific Gravity, Urine: 1.018 (ref 1.005–1.030)

## 2016-01-31 LAB — LIPASE, BLOOD: Lipase: 20 U/L (ref 11–51)

## 2016-01-31 MED ORDER — ONDANSETRON 4 MG PO TBDP
4.0000 mg | ORAL_TABLET | Freq: Once | ORAL | Status: AC
  Administered 2016-01-31: 4 mg via ORAL
  Filled 2016-01-31: qty 1

## 2016-01-31 MED ORDER — OXYCODONE-ACETAMINOPHEN 5-325 MG PO TABS: 2.0000 | ORAL_TABLET | Freq: Four times a day (QID) | ORAL | 0 refills | Status: DC | PRN

## 2016-01-31 MED ORDER — TAMSULOSIN HCL 0.4 MG PO CAPS: 0.4000 mg | ORAL_CAPSULE | Freq: Every day | ORAL | 0 refills | Status: DC

## 2016-01-31 MED ORDER — TRAMADOL HCL 50 MG PO TABS
50.0000 mg | ORAL_TABLET | Freq: Once | ORAL | Status: AC
  Administered 2016-01-31: 50 mg via ORAL
  Filled 2016-01-31: qty 1

## 2016-01-31 MED ORDER — ONDANSETRON 4 MG PO TBDP: 4.0000 mg | ORAL_TABLET | Freq: Three times a day (TID) | ORAL | 0 refills | Status: DC | PRN

## 2016-01-31 NOTE — ED Notes (Signed)
Pt. States abdominal pain that started Thursday night after eating.  Pt. States continued abdominal pain with vomiting after eating.  Pt. States continued abdominal pain today.  Pt. States hx of kidney stones.

## 2016-01-31 NOTE — ED Provider Notes (Signed)
[facility] Emergency Department Provider Note        Time seen: ----------------------------------------- 4:06 PM on 01/31/2016 -----------------------------------------    I have reviewed the triage vital signs and the nursing notes.   HISTORY  Chief Complaint Abdominal Pain and Emesis    HPI Patricia Hoover is a 71 y.o. female who presents to the ER for abdominal pain and cramping started Thursday. Patient states it is worse if she eats or drinks. Patient has not had any diarrhea, states she started vomiting has stopped twice today. She still feels nauseous, she took some Tums for pain with some relief. She denies fevers, chills or other complaints.   Past Medical History:  Diagnosis Date  . Arrhythmia    possible hx, resolved prev  . Blood transfusion without reported diagnosis 1972   had reaction; had to stop  . Carpal tunnel syndrome   . H/O exercise stress test 2012   normal  . Heart murmur    as child  . High cholesterol   . Hx of colonic polyps   . Hypertension   . Osteoporosis 2010   t score - 3.9  . Renal stones     Patient Active Problem List   Diagnosis Date Noted  . Heartburn 12/17/2015  . Biceps tendonitis 10/30/2014  . Rash and nonspecific skin eruption 09/03/2014  . Pain in the chest 02/08/2014  . Exertional shortness of breath 02/01/2014  . Medicare annual wellness visit, subsequent 06/05/2013  . Hyperglycemia 06/05/2013  . Chest wall pain 04/13/2013  . Tennis elbow 02/19/2013  . Knee pain 08/31/2012  . Osteoporosis 08/18/2012  . HTN (hypertension) 08/09/2012  . HLD (hyperlipidemia) 08/09/2012    Past Surgical History:  Procedure Laterality Date  . ABDOMINAL HYSTERECTOMY  1972   for endometriosis  . LITHOTRIPSY     for kidney stone x5    Allergies Apple; Bisphosphonates; and Prolia [denosumab]  Social History Social History  Substance Use Topics  . Smoking status: Former Smoker    Types: Cigarettes     Quit date: 01/19/1991  . Smokeless tobacco: Never Used  . Alcohol use Yes     Comment: occasional    Review of Systems Constitutional: Negative for fever. Cardiovascular: Negative for chest pain. Respiratory: Negative for shortness of breath. Gastrointestinal: Positive for abdominal pain, vomiting Genitourinary: Negative for dysuria. Musculoskeletal: Negative for back pain. Skin: Negative for rash. Neurological: Negative for headaches, focal weakness or numbness.  10-point ROS otherwise negative.  ____________________________________________   PHYSICAL EXAM:  VITAL SIGNS: ED Triage Vitals  Enc Vitals Group     BP 01/31/16 1410 135/90     Pulse Rate 01/31/16 1410 87     Resp 01/31/16 1410 18     Temp 01/31/16 1410 98.7 F (37.1 C)     Temp Source 01/31/16 1410 Oral     SpO2 01/31/16 1410 97 %     Weight 01/31/16 1411 165 lb (74.8 kg)     Height 01/31/16 1411 4' 9.5" (1.461 m)     Head Circumference --      Peak Flow --      Pain Score 01/31/16 1411 8     Pain Loc --      Pain Edu? --      Excl. in [place]? --     Constitutional: Alert and oriented. Well appearing and in no distress. Eyes: Conjunctivae are normal. PERRL. Normal extraocular movements. ENT   Head: Normocephalic and atraumatic.   Nose: No congestion/rhinnorhea.  Mouth/Throat: Mucous membranes are moist.   Neck: No stridor. Cardiovascular: Normal rate, regular rhythm. No murmurs, rubs, or gallops. Respiratory: Normal respiratory effort without tachypnea nor retractions. Breath sounds are clear and equal bilaterally. No wheezes/rales/rhonchi. Gastrointestinal: Soft and nontender. Normal bowel sounds Musculoskeletal: Nontender with normal range of motion in all extremities. No lower extremity tenderness nor edema. Neurologic:  Normal speech and language. No gross focal neurologic deficits are appreciated.  Skin:  Skin is warm, dry and intact. No rash noted. Psychiatric: Mood and affect are  normal. Speech and behavior are normal.  ____________________________________________  ED COURSE:  Pertinent labs & imaging results that were available during my care of the patient were reviewed by me and considered in my medical decision making (see chart for details). Clinical Course   Patient is in no distress, we will assess with labs and imaging. She'll receive oral Zofran.  Procedures ____________________________________________   LABS (pertinent positives/negatives)  Labs Reviewed  COMPREHENSIVE METABOLIC PANEL - Abnormal; Notable for the following:       Result Value   Glucose, Bld 166 (*)    ALT 13 (*)    All other components within normal limits  CBC - Abnormal; Notable for the following:    WBC 15.6 (*)    All other components within normal limits  URINALYSIS, COMPLETE (UACMP) WITH MICROSCOPIC - Abnormal; Notable for the following:    Color, Urine YELLOW (*)    APPearance CLEAR (*)    Hgb urine dipstick LARGE (*)    Protein, ur 30 (*)    Squamous Epithelial / LPF 0-5 (*)    All other components within normal limits  LIPASE, BLOOD    RADIOLOGY Images were viewed by me  CT renal protocol IMPRESSION: 1. 2.5 mm stone in the proximal left ureter causes mild left hydronephrosis. 2. No other acute finding. 3. Bilateral nonobstructing intrarenal stones. Large right renal cyst. 4. Numerous colonic diverticula. No diverticulitis. ____________________________________________  FINAL ASSESSMENT AND PLAN  Renal colic  Plan: Patient with labs and imaging as dictated above. Patient is in no acute distress, I reviewed her CT scan with the patient. She'll be discharged with pain medicine, Flomax and Zofran. She is stable for outpatient follow-up.   Earleen Newport, MD   Note: This dictation was prepared with Dragon dictation. Any transcriptional errors that result from this process are unintentional    Earleen Newport, MD 01/31/16 [phone]

## 2016-01-31 NOTE — ED Notes (Signed)
Patient transported to X-ray 

## 2016-01-31 NOTE — ED Triage Notes (Signed)
Patient presents to the ED with centralized abdominal pain and cramping starting Thursday that is worse if patient eats or drinks.  Patient states today she started vomiting and has thrown up twice.  Patient denies feeling nauseous at this time.  Patient reports taking tums for pain with some relief.

## 2016-02-12 DIAGNOSIS — Z6834 Body mass index (BMI) 34.0-34.9, adult: Secondary | ICD-10-CM | POA: Diagnosis not present

## 2016-02-12 DIAGNOSIS — N201 Calculus of ureter: Secondary | ICD-10-CM | POA: Diagnosis not present

## 2016-02-12 DIAGNOSIS — N200 Calculus of kidney: Secondary | ICD-10-CM | POA: Diagnosis not present

## 2016-02-18 ENCOUNTER — Encounter: Payer: Self-pay | Admitting: Family Medicine

## 2016-02-18 ENCOUNTER — Ambulatory Visit (INDEPENDENT_AMBULATORY_CARE_PROVIDER_SITE_OTHER): Payer: Medicare Other | Admitting: Family Medicine

## 2016-02-18 VITALS — BP 124/72 | HR 84 | Temp 98.7°F | Wt 172.0 lb

## 2016-02-18 DIAGNOSIS — R238 Other skin changes: Secondary | ICD-10-CM | POA: Insufficient documentation

## 2016-02-18 NOTE — Progress Notes (Signed)
She has f/u with urology pending.  Still on flomax and oxycodone.  She is able to tolerate the pain as is.  H/o renal stones in the past.    Bullous lesion on the L ankle, near the achilles.  She presumed something bit her.  She felt a sting.  It was itching and she rubbed it a lot, then it got more fluid in it locally.  Doesn't itch now.  No FCNAVD.  No sx like this prev.    Meds, vitals, and allergies reviewed.   ROS: Per HPI unless specifically indicated in ROS section   nad 2cm clean and dry bullous lesion on the L posterior ankle.  No redness, not ttp.

## 2016-02-18 NOTE — Patient Instructions (Signed)
I would try to leave the blister intact.  When it ruptures, cover with a bandage and neosporin.  If you have spreading redness or pain, then let me know.  Take care.  Glad to see you.

## 2016-02-18 NOTE — Assessment & Plan Note (Signed)
Could have been related to the presumed sting, but more likely compounded by traction on the skin after repeatedly rubbing the area.  Not typical for a true allergic reaction.  No other h/o of primary bullous disorder.  D/w pt.  Not bothersome, doesn't appear infected.  D/w pt about leaving blister intact for now and routine care after it ruptures likely in the next few days.  Superficial lesion, no other tx needed.  See AVS.  She agrees.

## 2016-02-26 DIAGNOSIS — N200 Calculus of kidney: Secondary | ICD-10-CM | POA: Diagnosis not present

## 2016-03-01 DIAGNOSIS — N200 Calculus of kidney: Secondary | ICD-10-CM | POA: Diagnosis not present

## 2016-03-09 DIAGNOSIS — Z79899 Other long term (current) drug therapy: Secondary | ICD-10-CM | POA: Diagnosis not present

## 2016-03-09 DIAGNOSIS — N200 Calculus of kidney: Secondary | ICD-10-CM | POA: Diagnosis not present

## 2016-03-09 DIAGNOSIS — I10 Essential (primary) hypertension: Secondary | ICD-10-CM | POA: Diagnosis not present

## 2016-03-11 DIAGNOSIS — I10 Essential (primary) hypertension: Secondary | ICD-10-CM | POA: Diagnosis not present

## 2016-03-11 DIAGNOSIS — N1330 Unspecified hydronephrosis: Secondary | ICD-10-CM | POA: Diagnosis not present

## 2016-03-11 DIAGNOSIS — Z8249 Family history of ischemic heart disease and other diseases of the circulatory system: Secondary | ICD-10-CM | POA: Diagnosis not present

## 2016-03-11 DIAGNOSIS — N132 Hydronephrosis with renal and ureteral calculous obstruction: Secondary | ICD-10-CM | POA: Diagnosis not present

## 2016-03-11 DIAGNOSIS — N200 Calculus of kidney: Secondary | ICD-10-CM | POA: Diagnosis not present

## 2016-03-11 DIAGNOSIS — R109 Unspecified abdominal pain: Secondary | ICD-10-CM | POA: Diagnosis not present

## 2016-03-11 DIAGNOSIS — R1032 Left lower quadrant pain: Secondary | ICD-10-CM | POA: Diagnosis not present

## 2016-03-11 DIAGNOSIS — R112 Nausea with vomiting, unspecified: Secondary | ICD-10-CM | POA: Diagnosis not present

## 2016-03-11 DIAGNOSIS — K219 Gastro-esophageal reflux disease without esophagitis: Secondary | ICD-10-CM | POA: Diagnosis not present

## 2016-03-11 DIAGNOSIS — N201 Calculus of ureter: Secondary | ICD-10-CM | POA: Diagnosis not present

## 2016-03-11 DIAGNOSIS — E785 Hyperlipidemia, unspecified: Secondary | ICD-10-CM | POA: Diagnosis not present

## 2016-03-16 DIAGNOSIS — N200 Calculus of kidney: Secondary | ICD-10-CM | POA: Diagnosis not present

## 2016-03-24 ENCOUNTER — Encounter: Payer: Self-pay | Admitting: Nurse Practitioner

## 2016-03-24 ENCOUNTER — Telehealth: Payer: Self-pay | Admitting: Family Medicine

## 2016-03-24 ENCOUNTER — Ambulatory Visit (INDEPENDENT_AMBULATORY_CARE_PROVIDER_SITE_OTHER): Payer: Medicare Other | Admitting: Nurse Practitioner

## 2016-03-24 VITALS — BP 112/74 | HR 112 | Temp 100.0°F | Ht <= 58 in | Wt 164.0 lb

## 2016-03-24 DIAGNOSIS — R509 Fever, unspecified: Secondary | ICD-10-CM

## 2016-03-24 DIAGNOSIS — K529 Noninfective gastroenteritis and colitis, unspecified: Secondary | ICD-10-CM | POA: Diagnosis not present

## 2016-03-24 LAB — POCT URINALYSIS DIPSTICK
Glucose, UA: NEGATIVE
Leukocytes, UA: NEGATIVE
Nitrite, UA: NEGATIVE
RBC UA: NEGATIVE
pH, UA: 6

## 2016-03-24 LAB — POCT INFLUENZA A/B
INFLUENZA A, POC: NEGATIVE
Influenza B, POC: NEGATIVE

## 2016-03-24 MED ORDER — ONDANSETRON HCL 4 MG/2ML IJ SOLN
4.0000 mg | Freq: Once | INTRAMUSCULAR | Status: AC
  Administered 2016-03-24: 4 mg via INTRAMUSCULAR

## 2016-03-24 NOTE — Telephone Encounter (Signed)
I spoke with pt and she is able to keep liquids down; pt will continue clear liquids until seen. Pt scheduled appt with Wilfred Lacy NP 03/24/16 at 3:30.

## 2016-03-24 NOTE — Progress Notes (Signed)
Subjective:  Patient ID: Patricia Hoover, female    DOB: Mar 29, 1945  Age: 71 y.o. MRN: [id number]  CC: Diarrhea (vomit and diarrhea since 2 am/)   Diarrhea   This is a new problem. The current episode started today. The problem occurs 2 to 4 times per day. The problem has been unchanged. The stool consistency is described as watery. The patient states that diarrhea does not awaken her from sleep. Associated symptoms include bloating, chills, a fever and vomiting. Pertinent negatives include no abdominal pain, arthralgias, coughing, headaches, increased  flatus, myalgias, sweats, URI or weight loss. Risk factors include ill contacts (grand child with similar symptoms). She has tried nothing for the symptoms.    Outpatient Medications Prior to Visit  Medication Sig Dispense Refill  . aspirin 81 MG tablet Take 81 mg by mouth daily.    . cholecalciferol (VITAMIN D) 1000 units tablet Take 2 tablets (2,000 Units total) by mouth daily.    Marland Kitchen ibuprofen (ADVIL,MOTRIN) 800 MG tablet Take 1 tablet (800 mg total) by mouth 3 (three) times daily as needed (with food.). 30 tablet 3  . lisinopril-hydrochlorothiazide (PRINZIDE,ZESTORETIC) 10-12.5 MG tablet Take 1 tablet by mouth daily. Dispense med produced by [employer] 90 tablet 3  . pantoprazole (PROTONIX) 20 MG tablet Take 1 tablet (20 mg total) by mouth daily. 90 tablet 1  . simvastatin (ZOCOR) 10 MG tablet Take 1 tablet (10 mg total) by mouth every other day. 45 tablet 3  . ondansetron (ZOFRAN ODT) 4 MG disintegrating tablet Take 1 tablet (4 mg total) by mouth every 8 (eight) hours as needed for nausea or vomiting. (Patient not taking: Reported on 03/24/2016) 20 tablet 0  . oxyCODONE-acetaminophen (PERCOCET) 5-325 MG tablet Take 2 tablets by mouth every 6 (six) hours as needed for moderate pain or severe pain. (Patient not taking: Reported on 03/24/2016) 20 tablet 0  . tamsulosin (FLOMAX) 0.4 MG CAPS capsule Take 1 capsule (0.4 mg total) by mouth daily after breakfast.  (Patient not taking: Reported on 03/24/2016) 30 capsule 0   No facility-administered medications prior to visit.     ROS See HPI  Objective:  BP 112/74   Pulse (!) 112   Temp 100 F (37.8 C)   Ht 4\' 10"  (1.473 m)   Wt 164 lb (74.4 kg)   SpO2 97%   BMI 34.28 kg/m   BP Readings from Last 3 Encounters:  03/24/16 112/74  02/18/16 124/72  01/31/16 131/67    Wt Readings from Last 3 Encounters:  03/24/16 164 lb (74.4 kg)  02/18/16 172 lb (78 kg)  01/31/16 165 lb (74.8 kg)    Physical Exam  Constitutional: She is oriented to person, place, and time. No distress.  Neck: Normal range of motion. Neck supple.  Cardiovascular: Normal rate.   Pulmonary/Chest: Effort normal.  Abdominal: Soft. Bowel sounds are normal. She exhibits no distension. There is no tenderness. There is no rebound and no guarding.  Musculoskeletal: She exhibits no edema.  Neurological: She is alert and oriented to person, place, and time.  Skin: Skin is warm and dry.  Vitals reviewed.   Lab Results  Component Value Date   WBC 15.6 (H) 01/31/2016   HGB 13.5 01/31/2016   HCT 39.4 01/31/2016   PLT 252 01/31/2016   GLUCOSE 166 (H) 01/31/2016   CHOL 192 12/10/2015   TRIG 132.0 12/10/2015   HDL 45.40 12/10/2015   LDLCALC 120 (H) 12/10/2015   ALT 13 (L) 01/31/2016   AST 23 01/31/2016  NA 141 01/31/2016   K 4.1 01/31/2016   CL 108 01/31/2016   CREATININE 0.67 01/31/2016   BUN 19 01/31/2016   CO2 25 01/31/2016   TSH 1.118 01/31/2014    Dg Abd 2 Views  Result Date: 01/31/2016 CLINICAL DATA:  Central abdominal pain and cramping beginning Thursday increased with eating and drinking, onset of vomiting today, history hypertension, renal calculi EXAM: ABDOMEN - 2 VIEW COMPARISON:  None FINDINGS: Lung bases clear. Bones demineralized with degenerative changes at inferior lumbar spine. Nonobstructive bowel gas pattern with scattered stool throughout colon. 13 x 6 mm calculus projects over LEFT kidney.  Questionable calcification 4 x 2 mm projecting over inferior pole RIGHT kidney. Tiny calcification projects over the RIGHT sacrum, question phlebolith cannot exclude ureteral calculus. Numerous additional pelvic calcifications multiplicity of which favors phleboliths. No other definite urinary tract calcifications. IMPRESSION: Potential BILATERAL renal calculi as above with an additional 2 mm calcification projecting over the RIGHT sacrum, potentially phlebolith though RIGHT ureteral calculus not excluded ; correlation with urinalysis recommended. Electronically Signed   By: Lavonia Dana M.D.   On: 01/31/2016 16:40   Ct Renal Stone Study  Result Date: 01/31/2016 CLINICAL DATA:  Pt. States abdominal pain that started Thursday night after eating. Pt. States continued abdominal pain with vomiting after eating. Pt. States continued abdominal pain today. Pt. States hx of kidney stones and tah EXAM: CT ABDOMEN AND PELVIS WITHOUT CONTRAST TECHNIQUE: Multidetector CT imaging of the abdomen and pelvis was performed following the standard protocol without IV contrast. COMPARISON:  None. FINDINGS: Lower chest: No acute abnormality. Hepatobiliary: No focal liver abnormality is seen. No gallstones, gallbladder wall thickening, or biliary dilatation. Pancreas: Unremarkable. No pancreatic ductal dilatation or surrounding inflammatory changes. Spleen: Normal in size without focal abnormality. Adrenals/Urinary Tract: No adrenal masses. Large upper pole right renal cyst measuring 13 cm. Bilateral nonobstructing intrarenal stones. Mild left hydronephrosis. This is due to a 2.5 mm stone in the proximal ureter. Ureter is normal caliber below this with no additional stones. Bladder is unremarkable. Stomach/Bowel: Stomach and small bowel are unremarkable. There are numerous colonic diverticula. No diverticulitis. Colon otherwise unremarkable. Vascular/Lymphatic: Aortic atherosclerosis. No enlarged abdominal or pelvic lymph nodes.  Reproductive: Status post hysterectomy. No adnexal masses. Other: No abdominal wall hernia or abnormality. No abdominopelvic ascites. Musculoskeletal: No fracture or acute finding. No osteoblastic or osteolytic lesions. IMPRESSION: 1. 2.5 mm stone in the proximal left ureter causes mild left hydronephrosis. 2. No other acute finding. 3. Bilateral nonobstructing intrarenal stones. Large right renal cyst. 4. Numerous colonic diverticula.  No diverticulitis. Electronically Signed   By: Lajean Manes M.D.   On: 01/31/2016 17:14    Assessment & Plan:   Jakiah was seen today for diarrhea.  Diagnoses and all orders for this visit:  Gastroenteritis -     ondansetron (ZOFRAN) injection 4 mg; Inject 2 mLs (4 mg total) into the muscle once. -     POCT urinalysis dipstick -     POCT Influenza A/B  Fever and chills -     POCT urinalysis dipstick -     POCT Influenza A/B   I am having Ms. Loper maintain her aspirin, pantoprazole, cholecalciferol, lisinopril-hydrochlorothiazide, simvastatin, ibuprofen, oxyCODONE-acetaminophen, tamsulosin, and ondansetron. We administered ondansetron.  Meds ordered this encounter  Medications  . ondansetron (ZOFRAN) injection 4 mg    Follow-up: Return if symptoms worsen or fail to improve.  Wilfred Lacy, NP

## 2016-03-24 NOTE — Patient Instructions (Signed)
Use zofran as prescribed. Encourage oral hydration. Go to hospital if no improvement in 48-72hrs   Food Choices to Help Relieve Diarrhea, Adult When you have diarrhea, the foods you eat and your eating habits are very important. Choosing the right foods and drinks can help:  Relieve diarrhea.  Replace lost fluids and nutrients.  Prevent dehydration. What general guidelines should I follow? Relieving diarrhea   Choose foods with less than 2 g or .07 oz. of fiber per serving.  Limit fats to less than 8 tsp (38 g or 1.34 oz.) a day.  Avoid the following:  Foods and beverages sweetened with high-fructose corn syrup, honey, or sugar alcohols such as xylitol, sorbitol, and mannitol.  Foods that contain a lot of fat or sugar.  Fried, greasy, or spicy foods.  High-fiber grains, breads, and cereals.  Raw fruits and vegetables.  Eat foods that are rich in probiotics. These foods include dairy products such as yogurt and fermented milk products. They help increase healthy bacteria in the stomach and intestines (gastrointestinal tract, or GI tract).  If you have lactose intolerance, avoid dairy products. These may make your diarrhea worse.  Take medicine to help stop diarrhea (antidiarrheal medicine) only as told by your health care provider. Replacing nutrients   Eat small meals or snacks every 3-4 hours.  Eat bland foods, such as white rice, toast, or baked potato, until your diarrhea starts to get better. Gradually reintroduce nutrient-rich foods as tolerated or as told by your health care provider. This includes:  Well-cooked protein foods.  Peeled, seeded, and soft-cooked fruits and vegetables.  Low-fat dairy products.  Take vitamin and mineral supplements as told by your health care provider. Preventing dehydration    Start by sipping water or a special solution to prevent dehydration (oral rehydration solution, ORS). Urine that is clear or pale yellow means that you  are getting enough fluid.  Try to drink at least 8-10 cups of fluid each day to help replace lost fluids.  You may add other liquids in addition to water, such as clear juice or decaffeinated sports drinks, as tolerated or as told by your health care provider.  Avoid drinks with caffeine, such as coffee, tea, or soft drinks.  Avoid alcohol. What foods are recommended? The items listed may not be a complete list. Talk with your health care provider about what dietary choices are best for you. Grains  White rice. White, [place], or pita breads (fresh or toasted), including plain rolls, buns, or bagels. White pasta. Saltine, soda, or graham crackers. Pretzels. Low-fiber cereal. Cooked cereals made with water (such as cornmeal, farina, or cream cereals). Plain muffins. Matzo. Melba toast. Zwieback. Vegetables  Potatoes (without the skin). Most well-cooked and canned vegetables without skins or seeds. Tender lettuce. Fruits  Apple sauce. Fruits canned in juice. Cooked apricots, cherries, grapefruit, peaches, pears, or plums. Fresh bananas and cantaloupe. Meats and other protein foods  Baked or boiled chicken. Eggs. Tofu. Fish. Seafood. Smooth nut butters. Ground or well-cooked tender beef, ham, veal, lamb, pork, or poultry. Dairy  Plain yogurt, kefir, and unsweetened liquid yogurt. Lactose-free milk, buttermilk, skim milk, or soy milk. Low-fat or nonfat hard cheese. Beverages  Water. Low-calorie sports drinks. Fruit juices without pulp. Strained tomato and vegetable juices. Decaffeinated teas. Sugar-free beverages not sweetened with sugar alcohols. Oral rehydration solutions, if approved by your health care provider. Seasoning and other foods  Bouillon, broth, or soups made from recommended foods. What foods are not recommended? The items  listed may not be a complete list. Talk with your health care provider about what dietary choices are best for you. Grains  Whole grain, whole wheat, bran, or  rye breads, rolls, pastas, and crackers. Wild or brown rice. Whole grain or bran cereals. Barley. Oats and oatmeal. Corn tortillas or taco shells. Granola. Popcorn. Vegetables  Raw vegetables. Fried vegetables. Cabbage, broccoli, Brussels sprouts, artichokes, baked beans, beet greens, corn, kale, legumes, peas, sweet potatoes, and yams. Potato skins. Cooked spinach and cabbage. Fruits  Dried fruit, including raisins and dates. Raw fruits. Stewed or dried prunes. Canned fruits with syrup. Meat and other protein foods  Fried or fatty meats. Deli meats. Chunky nut butters. Nuts and seeds. Beans and lentils. Berniece Salines. Hot dogs. Sausage. Dairy  High-fat cheeses. Whole milk, chocolate milk, and beverages made with milk, such as milk shakes. Half-and-half. Cream. sour cream. Ice cream. Beverages  Caffeinated beverages (such as coffee, tea, soda, or energy drinks). Alcoholic beverages. Fruit juices with pulp. Prune juice. Soft drinks sweetened with high-fructose corn syrup or sugar alcohols. High-calorie sports drinks. Fats and oils  Butter. Cream sauces. Margarine. Salad oils. Plain salad dressings. Olives. Avocados. Mayonnaise. Sweets and desserts  Sweet rolls, doughnuts, and sweet breads. Sugar-free desserts sweetened with sugar alcohols such as xylitol and sorbitol. Seasoning and other foods  Honey. Hot sauce. Chili powder. Gravy. Cream-based or milk-based soups. Pancakes and waffles. Summary  When you have diarrhea, the foods you eat and your eating habits are very important.  Make sure you get at least 8-10 cups of fluid each day, or enough to keep your urine clear or pale yellow.  Eat bland foods and gradually reintroduce healthy, nutrient-rich foods as tolerated, or as told by your health care provider.  Avoid high-fiber, fried, greasy, or spicy foods. This information is not intended to replace advice given to you by your health care provider. Make sure you discuss any questions you have with  your health care provider. Document Released: 03/27/2003 Document Revised: 01/02/2016 Document Reviewed: 01/02/2016 Elsevier Interactive Patient Education  2017 [employer]   Viral Gastroenteritis, Adult Viral gastroenteritis is also known as the stomach flu. This condition is caused by various viruses. These viruses can be passed from person to person very easily (are very contagious). This condition may affect your stomach, small intestine, and large intestine. It can cause sudden watery diarrhea, fever, and vomiting. Diarrhea and vomiting can make you feel weak and cause you to become dehydrated. You may not be able to keep fluids down. Dehydration can make you tired and thirsty, cause you to have a dry mouth, and decrease how often you urinate. Older adults and people with other diseases or a weak immune system are at higher risk for dehydration. It is important to replace the fluids that you lose from diarrhea and vomiting. If you become severely dehydrated, you may need to get fluids through an IV tube. What are the causes? Gastroenteritis is caused by various viruses, including rotavirus and norovirus. Norovirus is the most common cause in adults. You can get sick by eating food, drinking water, or touching a surface contaminated with one of these viruses. You can also get sick from sharing utensils or other personal items with an infected person. What increases the risk? This condition is more likely to develop in people:  Who have a weak defense system (immune system).  Who live with one or more children who are younger than 79 years old.  Who live in a nursing home.  Who go on cruise ships. What are the signs or symptoms? Symptoms of this condition start suddenly 1-2 days after exposure to a virus. Symptoms may last a few days or as long as a week. The most common symptoms are watery diarrhea and vomiting. Other symptoms include:  Fever.  Headache.  Fatigue.  Pain in the  abdomen.  Chills.  Weakness.  Nausea.  Muscle aches.  Loss of appetite. How is this diagnosed? This condition is diagnosed with a medical history and physical exam. You may also have a stool test to check for viruses or other infections. How is this treated? This condition typically goes away on its own. The focus of treatment is to restore lost fluids (rehydration). Your health care provider may recommend that you take an oral rehydration solution (ORS) to replace important salts and minerals (electrolytes) in your body. Severe cases of this condition may require giving fluids through an IV tube. Treatment may also include medicine to help with your symptoms. Follow these instructions at home: Follow instructions from your health care provider about how to care for yourself at home. Eating and drinking  Follow these recommendations as told by your health care provider:  Take an ORS. This is a drink that is sold at pharmacies and retail stores.  Drink clear fluids in small amounts as you are able. Clear fluids include water, ice chips, diluted fruit juice, and low-calorie sports drinks.  Eat bland, easy-to-digest foods in small amounts as you are able. These foods include bananas, applesauce, rice, lean meats, toast, and crackers.  Avoid fluids that contain a lot of sugar or caffeine, such as energy drinks, sports drinks, and soda.  Avoid alcohol.  Avoid spicy or fatty foods. General instructions    Drink enough fluid to keep your urine clear or pale yellow.  Wash your hands often. If soap and water are not available, use hand sanitizer.  Make sure that all people in your household wash their hands well and often.  Take over-the-counter and prescription medicines only as told by your health care provider.  Rest at home while you recover.  Watch your condition for any changes.  Take a warm bath to relieve any burning or pain from frequent diarrhea episodes.  Keep all  follow-up visits as told by your health care provider. This is important. Contact a health care provider if:  You cannot keep fluids down.  Your symptoms get worse.  You have new symptoms.  You feel light-headed or dizzy.  You have muscle cramps. Get help right away if:  You have chest pain.  You feel extremely weak or you faint.  You see blood in your vomit.  Your vomit looks like coffee grounds.  You have bloody or black stools or stools that look like tar.  You have a severe headache, a stiff neck, or both.  You have a rash.  You have severe pain, cramping, or bloating in your abdomen.  You have trouble breathing or you are breathing very quickly.  Your heart is beating very quickly.  Your skin feels cold and clammy.  You feel confused.  You have pain when you urinate.  You have signs of dehydration, such as:  Dark urine, very little urine, or no urine.  Cracked lips.  Dry mouth.  Sunken eyes.  Sleepiness.  Weakness. This information is not intended to replace advice given to you by your health care provider. Make sure you discuss any questions you have with your health care  provider. Document Released: 01/04/2005 Document Revised: 06/18/2015 Document Reviewed: 09/10/2014 Elsevier Interactive Patient Education  2017 [employer].

## 2016-03-24 NOTE — Telephone Encounter (Signed)
Patient Name: Patricia Hoover  DOB: Nov 04, 1945    Initial Comment mother vomiting, diarrhea all morning sees dr. Phillip Heal, not listed. verified location when calling back u will be calling the mother, she is aware, i talked to her    Nurse Assessment  Nurse: Raphael Gibney, RN, Vanita Ingles Date/Time (Eastern Time): 03/24/2016 9:54:08 AM  Confirm and document reason for call. If symptomatic, describe symptoms. ---Caller states she has been vomiting with diarrhea. Started around 11:30 pm last night. No fever. She is tired. She is drinking juice. Last time she vomited was at 7 am. Has urinated. Vomited x 4. No pain.  Does the patient have any new or worsening symptoms? ---Yes  Will a triage be completed? ---Yes  Related visit to physician within the last 2 weeks? ---No  Does the PT have any chronic conditions? (i.e. diabetes, asthma, etc.) ---No  Is this a behavioral health or substance abuse call? ---No     Guidelines    Guideline Title Affirmed Question Affirmed Notes  Vomiting [1] MODERATE vomiting (e.g., 3 - 5 times/day) AND [2] age > 74    Final Disposition User   Go to ED Now (or PCP triage) Raphael Gibney, RN, Vera    Comments  No appts available at [facility] today within 4 hrs. Pt does not want to go to urgent care of another office.  Called back line and spoke to [facility] and gave report that pt has been vomiting with diarrhea since 11:30 pm last night. No fever. Vomited x 4. She has urinated. No pain. Triage outcome go to ER now (or PCP triage). No appts available within 4 hrs at [place]. Pt does not want to go to urgent care or another office.   Referrals  GO TO FACILITY REFUSED   Disagree/Comply: Comply

## 2016-03-24 NOTE — Progress Notes (Signed)
Pre visit review using our clinic review tool, if applicable. No additional management support is needed unless otherwise documented below in the visit note. 

## 2016-04-03 ENCOUNTER — Emergency Department
Admission: EM | Admit: 2016-04-03 | Discharge: 2016-04-03 | Disposition: A | Discharge status: Home / Self Care | Payer: Medicare Other | Attending: Emergency Medicine | Admitting: Emergency Medicine

## 2016-04-03 ENCOUNTER — Encounter: Payer: Self-pay | Admitting: Emergency Medicine

## 2016-04-03 DIAGNOSIS — I10 Essential (primary) hypertension: Secondary | ICD-10-CM | POA: Diagnosis not present

## 2016-04-03 DIAGNOSIS — Z7982 Long term (current) use of aspirin: Secondary | ICD-10-CM | POA: Diagnosis not present

## 2016-04-03 DIAGNOSIS — Z87891 Personal history of nicotine dependence: Secondary | ICD-10-CM | POA: Insufficient documentation

## 2016-04-03 DIAGNOSIS — K625 Hemorrhage of anus and rectum: Secondary | ICD-10-CM

## 2016-04-03 DIAGNOSIS — Z79899 Other long term (current) drug therapy: Secondary | ICD-10-CM | POA: Diagnosis not present

## 2016-04-03 LAB — COMPREHENSIVE METABOLIC PANEL
ALK PHOS: 75 U/L (ref 38–126)
ALT: 15 U/L (ref 14–54)
AST: 28 U/L (ref 15–41)
Albumin: 3.8 g/dL (ref 3.5–5.0)
Anion gap: 7 (ref 5–15)
BILIRUBIN TOTAL: 0.3 mg/dL (ref 0.3–1.2)
BUN: 13 mg/dL (ref 6–20)
CALCIUM: 8.9 mg/dL (ref 8.9–10.3)
CO2: 27 mmol/L (ref 22–32)
Chloride: 105 mmol/L (ref 101–111)
Creatinine, Ser: 0.64 mg/dL (ref 0.44–1.00)
GFR calc Af Amer: >60 mL/min (ref >60)
GLUCOSE: 200 mg/dL — AB (ref 65–99)
POTASSIUM: 3.4 mmol/L — AB (ref 3.5–5.1)
Sodium: 139 mmol/L (ref 135–145)
TOTAL PROTEIN: 7.2 g/dL (ref 6.5–8.1)

## 2016-04-03 LAB — CBC
HEMATOCRIT: 39 % (ref 35.0–47.0)
Hemoglobin: 13.5 g/dL (ref 12.0–16.0)
MCH: 29.9 pg (ref 26.0–34.0)
MCHC: 34.5 g/dL (ref 32.0–36.0)
MCV: 86.6 fL (ref 80.0–100.0)
PLATELETS: 279 10*3/uL (ref 150–440)
RBC: 4.5 MIL/uL (ref 3.80–5.20)
RDW: 13.6 % (ref 11.5–14.5)
WBC: 9.4 10*3/uL (ref 3.6–11.0)

## 2016-04-03 LAB — URINALYSIS, COMPLETE (UACMP) WITH MICROSCOPIC
Bacteria, UA: NONE SEEN
Bilirubin Urine: NEGATIVE
GLUCOSE, UA: NEGATIVE mg/dL
KETONES UR: NEGATIVE mg/dL
Leukocytes, UA: NEGATIVE
NITRITE: NEGATIVE
PH: 6 (ref 5.0–8.0)
Protein, ur: NEGATIVE mg/dL
Specific Gravity, Urine: 1.017 (ref 1.005–1.030)

## 2016-04-03 LAB — HEMOGLOBIN AND HEMATOCRIT, BLOOD
HCT: 38 % (ref 35.0–47.0)
Hemoglobin: 13 g/dL (ref 12.0–16.0)

## 2016-04-03 LAB — LIPASE, BLOOD: LIPASE: 30 U/L (ref 11–51)

## 2016-04-03 NOTE — ED Provider Notes (Signed)
-----------------------------------------   4:14 PM on 04/03/2016 -----------------------------------------  Patient's repeat H&H is unchanged. We will discharge the patient home with GI follow-up. I discussed this with the patient is agreeable to this plan.   Harvest Dark, MD 04/03/16 [phone]

## 2016-04-03 NOTE — Discharge Instructions (Signed)
Please call the number provided for GI medicine to arrange a follow-up appointment as soon as possible. Return to the emergency department for any black stool, lightheadedness, dizziness, significant abdominal pain or fever. Otherwise please follow up with her primary care doctor this week for recheck/reevaluation.

## 2016-04-03 NOTE — ED Provider Notes (Addendum)
[facility] Emergency Department Provider Note  ____________________________________________   I have reviewed the triage vital signs and the nursing notes.   HISTORY  Chief Complaint Rectal Bleeding    HPI Patricia Hoover is a 71 y.o. female he takes aspirin has no other anticoagulation does not regularly take ibuprofen, is not on Plavix or Coumadin, no history of significant GI bleed does have a history of hemorrhoids with hemorrhoidal bleeding complains of hematochezia since Friday. No pain involved. Has had normal brown stools with blood around the outside "like lightning" in appearance. Small clots as well. Not lightheaded no other complaints of abdominal pain, no hematemesis or vomiting eating and drinking with no difficulty normal bowel movements otherwise. Denies melena has had a cold last 3 within the last year which showed hemorrhoids and diverticular disease. Has never had diverticulitis that she knows of. He denies rectal pain.       Past Medical History:  Diagnosis Date  . Arrhythmia    possible hx, resolved prev  . Blood transfusion without reported diagnosis 1972   had reaction; had to stop  . Carpal tunnel syndrome   . H/O exercise stress test 2012   normal  . Heart murmur    as child  . High cholesterol   . Hx of colonic polyps   . Hypertension   . Osteoporosis 2010   t score - 3.9  . Renal stones     Patient Active Problem List   Diagnosis Date Noted  . Bullous lesion 02/18/2016  . Heartburn 12/17/2015  . Biceps tendonitis 10/30/2014  . Rash and nonspecific skin eruption 09/03/2014  . Pain in the chest 02/08/2014  . Exertional shortness of breath 02/01/2014  . Medicare annual wellness visit, subsequent 06/05/2013  . Hyperglycemia 06/05/2013  . Chest wall pain 04/13/2013  . Tennis elbow 02/19/2013  . Knee pain 08/31/2012  . Osteoporosis 08/18/2012  . HTN (hypertension) 08/09/2012  . HLD (hyperlipidemia) 08/09/2012    Past  Surgical History:  Procedure Laterality Date  . ABDOMINAL HYSTERECTOMY  1972   for endometriosis  . LITHOTRIPSY     for kidney stone x5    Prior to Admission medications   Medication Sig Start Date End Date Taking? Authorizing Provider  aspirin 81 MG tablet Take 81 mg by mouth daily.    Historical Provider, MD  cholecalciferol (VITAMIN D) 1000 units tablet Take 2 tablets (2,000 Units total) by mouth daily. 12/16/15   Tonia Ghent, MD  ibuprofen (ADVIL,MOTRIN) 800 MG tablet Take 1 tablet (800 mg total) by mouth 3 (three) times daily as needed (with food.). 12/16/15   Tonia Ghent, MD  lisinopril-hydrochlorothiazide (PRINZIDE,ZESTORETIC) 10-12.5 MG tablet Take 1 tablet by mouth daily. Dispense med produced by [employer] 12/16/15   Tonia Ghent, MD  ondansetron (ZOFRAN ODT) 4 MG disintegrating tablet Take 1 tablet (4 mg total) by mouth every 8 (eight) hours as needed for nausea or vomiting. Patient not taking: Reported on 03/24/2016 01/31/16   Earleen Newport, MD  oxyCODONE-acetaminophen (PERCOCET) 5-325 MG tablet Take 2 tablets by mouth every 6 (six) hours as needed for moderate pain or severe pain. Patient not taking: Reported on 03/24/2016 01/31/16   Earleen Newport, MD  pantoprazole (PROTONIX) 20 MG tablet Take 1 tablet (20 mg total) by mouth daily. 12/16/15   Tonia Ghent, MD  simvastatin (ZOCOR) 10 MG tablet Take 1 tablet (10 mg total) by mouth every other day. 12/16/15   Tonia Ghent, MD  tamsulosin (FLOMAX) 0.4 MG CAPS capsule Take 1 capsule (0.4 mg total) by mouth daily after breakfast. Patient not taking: Reported on 03/24/2016 01/31/16   Earleen Newport, MD    Allergies Apple; Bisphosphonates; and Prolia [denosumab]  Family History  Problem Relation Age of Onset  . Heart disease Mother   . Renal Disease Mother   . Heart failure Mother   . Colon cancer Neg Hx   . Breast cancer Neg Hx     Social History Social History  Substance Use Topics  . Smoking status:  Former Smoker    Types: Cigarettes    Quit date: 01/19/1991  . Smokeless tobacco: Never Used  . Alcohol use No     Comment: occasional    Review of Systems Constitutional: No fever/chills Eyes: No visual changes. ENT: No sore throat. No stiff neck no neck pain Cardiovascular: Denies chest pain. Respiratory: Denies shortness of breath. Gastrointestinal:   no vomiting.  No diarrhea.  No constipation. Genitourinary: Negative for dysuria. Musculoskeletal: Negative lower extremity swelling Skin: Negative for rash. Neurological: Negative for severe headaches, focal weakness or numbness. 10-point ROS otherwise negative.  ____________________________________________   PHYSICAL EXAM:  VITAL SIGNS: ED Triage Vitals  Enc Vitals Group     BP 04/03/16 1124 (!) 154/55     Pulse Rate 04/03/16 1124 74     Resp 04/03/16 1124 16     Temp 04/03/16 1124 98.7 F (37.1 C)     Temp Source 04/03/16 1124 Oral     SpO2 04/03/16 1124 99 %     Weight 04/03/16 1125 164 lb (74.4 kg)     Height 04/03/16 1125 4\' 10"  (1.473 m)     Head Circumference --      Peak Flow --      Pain Score 04/03/16 1125 0     Pain Loc --      Pain Edu? --      Excl. in [place]? --     Constitutional: Alert and oriented. Well appearing and in no acute distress. Eyes: Conjunctivae are normal. PERRL. EOMI. Head: Atraumatic. Nose: No congestion/rhinnorhea. Mouth/Throat: Mucous membranes are moist.  Oropharynx non-erythematous. Neck: No stridor.   Nontender with no meningismus Cardiovascular: Normal rate, regular rhythm. Grossly normal heart sounds.  Good peripheral circulation. Respiratory: Normal respiratory effort.  No retractions. Lungs CTAB. Abdominal: Soft and nontender. No distention. No guarding no rebound Back:  There is no focal tenderness or step off.  there is no midline tenderness there are no lesions noted. there is no CVA tenderness Rectal exam: Female nurse chaperone present, there is a area of exposed mucosa  which is approximately 1 x 2 cm. This could be a hemorrhoid although it also looks like perhaps a tiny area of prolapse. In any event, there is faintly guaiac positive stool with no melena no bright red blood per rectum noted. Musculoskeletal: No lower extremity tenderness, no upper extremity tenderness. No joint effusions, no DVT signs strong distal pulses no edema Neurologic:  Normal speech and language. No gross focal neurologic deficits are appreciated.  Skin:  Skin is warm, dry and intact. No rash noted. Psychiatric: Mood and affect are normal. Speech and behavior are normal.  ____________________________________________   LABS (all labs ordered are listed, but only abnormal results are displayed)  Labs Reviewed  COMPREHENSIVE METABOLIC PANEL - Abnormal; Notable for the following:       Result Value   Potassium 3.4 (*)    Glucose, Bld 200 (*)  All other components within normal limits  URINALYSIS, COMPLETE (UACMP) WITH MICROSCOPIC - Abnormal; Notable for the following:    Color, Urine YELLOW (*)    APPearance CLEAR (*)    Hgb urine dipstick SMALL (*)    Squamous Epithelial / LPF 0-5 (*)    All other components within normal limits  LIPASE, BLOOD  CBC   ____________________________________________  EKG  I personally interpreted any EKGs ordered by me or triage  ____________________________________________  RADIOLOGY  I reviewed any imaging ordered by me or triage that were performed during my shift and, if possible, patient and/or family made aware of any abnormal findings. ____________________________________________   PROCEDURES  Procedure(s) performed: None  Procedures  Critical Care performed: None  ____________________________________________   INITIAL IMPRESSION / ASSESSMENT AND PLAN / ED COURSE  Pertinent labs & imaging results that were available during my care of the patient were reviewed by me and considered in my medical decision making (see chart  for details).  Patient with no complaints aside from hematochezia very well-appearing despite having had bleeding since yesterday, she is hemoglobin is stable and her vital signs are reassuring. The complaint is not melena or frank blood it is hematochezia and there is a clear and obvious source of the actual rectal verge.. Diverticular origin is also possible but more likely is from hemorrhoidal pathology. In any event no evidence of any significant bleeding event today, I think she will likely be safe for outpatient close follow-up with her GI doctor. We'll perform orthostatic vital signs. patient has no complaints at this time. She would prefer to be discharged if possible   ----------------------------------------- 3:17 PM on 04/03/2016 -----------------------------------------  Discussed with Dr. Epimenio Foot of GI who feels that patient is long as she isn't remains asymptomatic should be safe for discharge. He agrees with management. She does have a change in her blood pressure when she stands up I don't think this is likely related to bleeding as she states she feels exactly the same as she always does when she stands and she is asymptomatic however we will check an H&H again to ensure that there is been no evidence of drop of that stays reassuring we will discharge since return precautions upon given understood signed out to Dr. Kerman Passey  at the end of my shift ____________________________________________   FINAL CLINICAL IMPRESSION(S) / ED DIAGNOSES  Final diagnoses:  None      This chart was dictated using voice recognition software.  Despite best efforts to proofread,  errors can occur which can change meaning.      Schuyler Amor, MD 04/03/16 [place], MD 04/03/16 [id number]

## 2016-04-03 NOTE — ED Triage Notes (Signed)
Pt to ed with c/o bloody stools that started last night and today. Denies abd pain.  But reports she did have some last night.

## 2016-04-20 ENCOUNTER — Other Ambulatory Visit: Payer: Self-pay

## 2016-04-20 ENCOUNTER — Telehealth: Payer: Self-pay

## 2016-04-20 ENCOUNTER — Ambulatory Visit (INDEPENDENT_AMBULATORY_CARE_PROVIDER_SITE_OTHER): Payer: Medicare Other | Admitting: Gastroenterology

## 2016-04-20 ENCOUNTER — Encounter: Payer: Self-pay | Admitting: Gastroenterology

## 2016-04-20 VITALS — BP 124/59 | Temp 97.8°F | Ht <= 58 in | Wt 170.4 lb

## 2016-04-20 DIAGNOSIS — K625 Hemorrhage of anus and rectum: Secondary | ICD-10-CM

## 2016-04-20 DIAGNOSIS — N200 Calculus of kidney: Secondary | ICD-10-CM | POA: Insufficient documentation

## 2016-04-20 NOTE — Telephone Encounter (Signed)
Gastroenterology Pre-Procedure Review  Request Date: 4/17  Requesting Physician: Dr. Vicente Males  PATIENT REVIEW QUESTIONS: The patient responded to the following health history questions as indicated:    1. Are you having any GI issues? yes (rectal bleeding) 2. Do you have a personal history of Polyps? no 3. Do you have a family history of Colon Cancer or Polyps? no 4. Diabetes Mellitus? no 5. Joint replacements in the past 12 months?no 6. Major health problems in the past 3 months?no 7. Any artificial heart valves, MVP, or defibrillator?no    MEDICATIONS & ALLERGIES:    Patient reports the following regarding taking any anticoagulation/antiplatelet therapy:   Plavix, Coumadin, Eliquis, Xarelto, Lovenox, Pradaxa, Brilinta, or Effient? no Aspirin? yes (81mg )  Patient confirms/reports the following medications:  Current Outpatient Prescriptions  Medication Sig Dispense Refill  . aspirin 81 MG tablet Take 81 mg by mouth daily.    . cholecalciferol (VITAMIN D) 1000 units tablet Take 2 tablets (2,000 Units total) by mouth daily.    Marland Kitchen ibuprofen (ADVIL,MOTRIN) 800 MG tablet Take 1 tablet (800 mg total) by mouth 3 (three) times daily as needed (with food.). 30 tablet 3  . lisinopril-hydrochlorothiazide (PRINZIDE,ZESTORETIC) 10-12.5 MG tablet Take 1 tablet by mouth daily. Dispense med produced by [employer] 90 tablet 3  . ondansetron (ZOFRAN ODT) 4 MG disintegrating tablet Take 1 tablet (4 mg total) by mouth every 8 (eight) hours as needed for nausea or vomiting. (Patient not taking: Reported on 03/24/2016) 20 tablet 0  . oxyCODONE-acetaminophen (PERCOCET) 5-325 MG tablet Take 2 tablets by mouth every 6 (six) hours as needed for moderate pain or severe pain. (Patient not taking: Reported on 03/24/2016) 20 tablet 0  . pantoprazole (PROTONIX) 20 MG tablet Take 1 tablet (20 mg total) by mouth daily. (Patient not taking: Reported on 04/03/2016) 90 tablet 1  . simvastatin (ZOCOR) 10 MG tablet Take 1 tablet (10 mg  total) by mouth every other day. 45 tablet 3  . tamsulosin (FLOMAX) 0.4 MG CAPS capsule Take 1 capsule (0.4 mg total) by mouth daily after breakfast. (Patient not taking: Reported on 03/24/2016) 30 capsule 0   No current facility-administered medications for this visit.     Patient confirms/reports the following allergies:  Allergies  Allergen Reactions  . Apple Other (See Comments)    heartburn  . Bisphosphonates     GI side effects GI side effects  . Prolia [Denosumab]     GI upset GI upset    No orders of the defined types were placed in this encounter.   AUTHORIZATION INFORMATION Primary Insurance: 1D#: Group #:  Secondary Insurance: 1D#: Group #:  SCHEDULE INFORMATION: Date: 05/04/16 Time: Location: [place]

## 2016-04-20 NOTE — Progress Notes (Signed)
Gastroenterology Consultation  Referring Provider:     Tonia Ghent, MD Primary Care Physician:  Elsie Stain, MD Primary Gastroenterologist:  Dr. Jonathon Bellows  Reason for Consultation:     Rectal bleeding         HPI:   Patricia Hoover is a 71 y.o. y/o female referred for consultation & management  by Dr. Elsie Stain, MD.   He presented to the Er on 03/2016 with hematochezia . Hb 13.5 . She says everythng was fine and all of a sudden one day went into the restroom , had blood mixed in the stool which resolved. No further recurrences, no abdominal pain. No NSAID use.   Last colonoscopy in 2016 by [facility] showed a small tubular adenoma . Not on any blood thinners.   Past Medical History:  Diagnosis Date  . Arrhythmia    possible hx, resolved prev  . Blood transfusion without reported diagnosis 1972   had reaction; had to stop  . Carpal tunnel syndrome   . H/O exercise stress test 2012   normal  . Heart murmur    as child  . High cholesterol   . Hx of colonic polyps   . Hypertension   . Osteoporosis 2010   t score - 3.9  . Renal stones     Past Surgical History:  Procedure Laterality Date  . ABDOMINAL HYSTERECTOMY  1972   for endometriosis  . LITHOTRIPSY     for kidney stone x5    Prior to Admission medications   Medication Sig Start Date End Date Taking? Authorizing Provider  aspirin 81 MG tablet Take 81 mg by mouth daily.   Yes Historical Provider, MD  cholecalciferol (VITAMIN D) 1000 units tablet Take 2 tablets (2,000 Units total) by mouth daily. 12/16/15  Yes Tonia Ghent, MD  ibuprofen (ADVIL,MOTRIN) 800 MG tablet Take 1 tablet (800 mg total) by mouth 3 (three) times daily as needed (with food.). 12/16/15  Yes Tonia Ghent, MD  lisinopril-hydrochlorothiazide (PRINZIDE,ZESTORETIC) 10-12.5 MG tablet Take 1 tablet by mouth daily. Dispense med produced by [employer] 12/16/15  Yes Tonia Ghent, MD  simvastatin (ZOCOR) 10 MG tablet Take 1 tablet (10 mg total) by mouth  every other day. 12/16/15  Yes Tonia Ghent, MD  ondansetron (ZOFRAN ODT) 4 MG disintegrating tablet Take 1 tablet (4 mg total) by mouth every 8 (eight) hours as needed for nausea or vomiting. Patient not taking: Reported on 03/24/2016 01/31/16   Earleen Newport, MD  oxyCODONE-acetaminophen (PERCOCET) 5-325 MG tablet Take 2 tablets by mouth every 6 (six) hours as needed for moderate pain or severe pain. Patient not taking: Reported on 03/24/2016 01/31/16   Earleen Newport, MD  pantoprazole (PROTONIX) 20 MG tablet Take 1 tablet (20 mg total) by mouth daily. Patient not taking: Reported on 04/03/2016 12/16/15   Tonia Ghent, MD  tamsulosin [facility]) 0.4 MG CAPS capsule Take 1 capsule (0.4 mg total) by mouth daily after breakfast. Patient not taking: Reported on 03/24/2016 01/31/16   Earleen Newport, MD    Family History  Problem Relation Age of Onset  . Heart disease Mother   . Renal Disease Mother   . Heart failure Mother   . Colon cancer Neg Hx   . Breast cancer Neg Hx      Social History  Substance Use Topics  . Smoking status: Former Smoker    Types: Cigarettes    Quit date: 01/19/1991  . Smokeless tobacco: Never  Used  . Alcohol use No     Comment: occasional    Allergies as of 04/20/2016 - Review Complete 04/20/2016  Allergen Reaction Noted  . Apple Other (See Comments) 10/25/2014  . Bisphosphonates  08/18/2012  . Prolia [denosumab]  06/21/2013    Review of Systems:    All systems reviewed and negative except where noted in HPI.   Physical Exam:  BP (!) 124/59 (BP Location: Left Arm, Patient Position: Sitting, Cuff Size: Large)   Temp 97.8 F (36.6 C) (Oral)   Ht 4\' 10"  (1.473 m)   Wt 170 lb 6.4 oz (77.3 kg)   BMI 35.61 kg/m  No LMP recorded. Patient has had a hysterectomy. Psych:  Alert and cooperative. Normal mood and affect. General:   Alert,  Well-developed, well-nourished, pleasant and cooperative in NAD Head:  Normocephalic and atraumatic. Eyes:   Sclera clear, no icterus.   Conjunctiva pink. Ears:  Normal auditory acuity. Nose:  No deformity, discharge, or lesions. Mouth:  No deformity or lesions,oropharynx pink & moist. Neck:  Supple; no masses or thyromegaly. Lungs:  Respirations even and unlabored.  Clear throughout to auscultation.   No wheezes, crackles, or rhonchi. No acute distress. Heart:  Regular rate and rhythm; no murmurs, clicks, rubs, or gallops. Abdomen:  Normal bowel sounds.  No bruits.  Soft, non-tender and non-distended without masses, hepatosplenomegaly or hernias noted.  No guarding or rebound tenderness.    Neurologic:  Alert and oriented x3;  grossly normal neurologically. Skin:  Intact without significant lesions or rashes. No jaundice.   Imaging Studies: No results found.  Assessment and Plan:   Patricia Hoover is a 71 y.o. y/o female has been referred for painless rectal bleeding on 1 occasion recently . History is very suggestive of a diverticular bleed. Discussed options of not doing anything vs performing a colonoscopy to be sure that no lesions were missed in 2016 which can occur in 10-15% even in the best of endoscopists. She chose to proceed with a colonoscopy   Follow up as needed.   Dr Jonathon Bellows MD

## 2016-04-21 ENCOUNTER — Telehealth: Payer: Self-pay | Admitting: Gastroenterology

## 2016-04-21 NOTE — Telephone Encounter (Signed)
No authorization required for [facility] for colonoscopy for rectal bleeding K62.5

## 2016-05-03 ENCOUNTER — Encounter: Payer: Self-pay | Admitting: *Deleted

## 2016-05-04 ENCOUNTER — Encounter
Admission: RE | Disposition: A | Discharge status: Home / Self Care | Payer: Self-pay | Source: Ambulatory Visit | Attending: Gastroenterology

## 2016-05-04 ENCOUNTER — Ambulatory Visit
Admission: RE | Admit: 2016-05-04 | Discharge: 2016-05-04 | Disposition: A | Discharge status: Home / Self Care | Payer: Medicare Other | Source: Ambulatory Visit | Attending: Gastroenterology | Admitting: Gastroenterology

## 2016-05-04 ENCOUNTER — Ambulatory Visit: Payer: Medicare Other | Admitting: Certified Registered Nurse Anesthetist

## 2016-05-04 ENCOUNTER — Encounter: Payer: Self-pay | Admitting: *Deleted

## 2016-05-04 DIAGNOSIS — D122 Benign neoplasm of ascending colon: Secondary | ICD-10-CM | POA: Insufficient documentation

## 2016-05-04 DIAGNOSIS — K625 Hemorrhage of anus and rectum: Secondary | ICD-10-CM | POA: Insufficient documentation

## 2016-05-04 DIAGNOSIS — K648 Other hemorrhoids: Secondary | ICD-10-CM | POA: Diagnosis not present

## 2016-05-04 DIAGNOSIS — Z7982 Long term (current) use of aspirin: Secondary | ICD-10-CM | POA: Insufficient documentation

## 2016-05-04 DIAGNOSIS — E7800 Pure hypercholesterolemia, unspecified: Secondary | ICD-10-CM | POA: Insufficient documentation

## 2016-05-04 DIAGNOSIS — K640 First degree hemorrhoids: Secondary | ICD-10-CM | POA: Diagnosis not present

## 2016-05-04 DIAGNOSIS — K635 Polyp of colon: Secondary | ICD-10-CM | POA: Diagnosis not present

## 2016-05-04 DIAGNOSIS — Z79899 Other long term (current) drug therapy: Secondary | ICD-10-CM | POA: Insufficient documentation

## 2016-05-04 DIAGNOSIS — Z791 Long term (current) use of non-steroidal anti-inflammatories (NSAID): Secondary | ICD-10-CM | POA: Diagnosis not present

## 2016-05-04 DIAGNOSIS — Z87891 Personal history of nicotine dependence: Secondary | ICD-10-CM | POA: Insufficient documentation

## 2016-05-04 DIAGNOSIS — K5730 Diverticulosis of large intestine without perforation or abscess without bleeding: Secondary | ICD-10-CM | POA: Diagnosis not present

## 2016-05-04 DIAGNOSIS — I10 Essential (primary) hypertension: Secondary | ICD-10-CM | POA: Insufficient documentation

## 2016-05-04 HISTORY — PX: COLONOSCOPY WITH PROPOFOL: SHX5780

## 2016-05-04 HISTORY — DX: Personal history of urinary calculi: Z87.442

## 2016-05-04 SURGERY — COLONOSCOPY WITH PROPOFOL
Anesthesia: General

## 2016-05-04 MED ORDER — LIDOCAINE HCL (CARDIAC) 20 MG/ML IV SOLN
INTRAVENOUS | Status: DC | PRN
  Administered 2016-05-04: 80 mg via INTRAVENOUS

## 2016-05-04 MED ORDER — PROPOFOL 500 MG/50ML IV EMUL
INTRAVENOUS | Status: AC
  Filled 2016-05-04: qty 50

## 2016-05-04 MED ORDER — SODIUM CHLORIDE 0.9 % IV SOLN
INTRAVENOUS | Status: DC
  Administered 2016-05-04: 09:00:00 via INTRAVENOUS

## 2016-05-04 MED ORDER — PROPOFOL 500 MG/50ML IV EMUL
INTRAVENOUS | Status: DC | PRN
  Administered 2016-05-04: 140 ug/kg/min via INTRAVENOUS

## 2016-05-04 MED ORDER — PROPOFOL 10 MG/ML IV BOLUS
INTRAVENOUS | Status: DC | PRN
  Administered 2016-05-04: 70 mg via INTRAVENOUS

## 2016-05-04 MED ORDER — LIDOCAINE HCL (PF) 2 % IJ SOLN
INTRAMUSCULAR | Status: AC
  Filled 2016-05-04: qty 2

## 2016-05-04 NOTE — Transfer of Care (Signed)
Immediate Anesthesia Transfer of Care Note  Patient: Patricia Hoover  Procedure(s) Performed: Procedure(s): COLONOSCOPY WITH PROPOFOL (N/A)  Patient Location: PACU  Anesthesia Type:General  Level of Consciousness: awake, alert  and oriented  Airway & Oxygen Therapy: Patient Spontanous Breathing and Patient connected to nasal cannula oxygen  Post-op Assessment: Report given to RN and Post -op Vital signs reviewed and stable  Post vital signs: Reviewed and stable  Last Vitals:  Vitals:   05/04/16 0818  BP: 120/82  Pulse: 98  Resp: 20  Temp: 36.6 C    Last Pain:  Vitals:   05/04/16 0818  TempSrc: Tympanic         Complications: No apparent anesthesia complications

## 2016-05-04 NOTE — Anesthesia Procedure Notes (Signed)
Performed by: Xochil Shanker Pre-anesthesia Checklist: Patient identified, Emergency Drugs available, Suction available, Patient being monitored and Timeout performed Patient Re-evaluated:Patient Re-evaluated prior to inductionOxygen Delivery Method: Nasal cannula Intubation Type: IV induction       

## 2016-05-04 NOTE — Anesthesia Post-op Follow-up Note (Cosign Needed)
Anesthesia QCDR form completed.        

## 2016-05-04 NOTE — Op Note (Signed)
[facility] Gastroenterology Patient Name: Patricia Hoover Procedure Date: 05/04/2016 9:13 AM MRN: [id number] Account #: [id number] Date of Birth: [birth year] Admit Type: Outpatient Age: 71 Room: [facility] ENDO ROOM 3 Gender: Female Note Status: Finalized Procedure:            Colonoscopy Indications:          Rectal bleeding Providers:            Jonathon Bellows MD, MD Referring MD:         Elveria Rising. Damita Dunnings, MD (Referring MD) Medicines:            Monitored Anesthesia Care Complications:        No immediate complications. Procedure:            Pre-Anesthesia Assessment:                       - Prior to the procedure, a History and Physical was                        performed, and patient medications, allergies and                        sensitivities were reviewed. The patient's tolerance of                        previous anesthesia was reviewed.                       - The risks and benefits of the procedure and the                        sedation options and risks were discussed with the                        patient. All questions were answered and informed                        consent was obtained.                       - ASA Grade Assessment: II - A patient with mild                        systemic disease.                       After obtaining informed consent, the colonoscope was                        passed under direct vision. Throughout the procedure,                        the patient's blood pressure, pulse, and oxygen                        saturations were monitored continuously. The Olympus                        CF-H180AL colonoscope ( S#: [id number] ) was introduced  through the anus and advanced to the the cecum,                        identified by the appendiceal orifice, IC valve and                        transillumination. The colonoscopy was performed with                        ease. The patient tolerated the procedure well. The                         quality of the bowel preparation was good. Findings:      A 5 mm polyp was found in the ascending colon. The polyp was sessile.       The polyp was removed with a cold biopsy forceps. Resection and       retrieval were complete.      A 7 mm polyp was found in the ascending colon. The polyp was sessile.       The polyp was removed with a cold snare. Resection and retrieval were       complete.      Multiple medium-mouthed diverticula were found in the entire colon.      Non-bleeding internal hemorrhoids were found during retroflexion. The       hemorrhoids were medium-sized and Grade I (internal hemorrhoids that do       not prolapse).      The exam was otherwise without abnormality on direct and retroflexion       views. Impression:           - One 5 mm polyp in the ascending colon, removed with a                        cold biopsy forceps. Resected and retrieved.                       - One 7 mm polyp in the ascending colon, removed with a                        cold snare. Resected and retrieved.                       - Diverticulosis in the entire examined colon.                       - Non-bleeding internal hemorrhoids.                       - The examination was otherwise normal on direct and                        retroflexion views. Recommendation:       - Discharge patient to home (with escort).                       - Await pathology results.                       - Resume previous diet today.                       -  Repeat colonoscopy in 5 years for surveillance.                       - Return to GI office PRN. Procedure Code(s):    --- Professional ---                       [phone], Colonoscopy, flexible; with removal of tumor(s),                        polyp(s), or other lesion(s) by snare technique                       [id number], 69, Colonoscopy, flexible; with biopsy, single                        or multiple Diagnosis Code(s):    --- Professional  ---                       D12.2, Benign neoplasm of ascending colon                       K64.0, First degree hemorrhoids                       K62.5, Hemorrhage of anus and rectum                       K57.30, Diverticulosis of large intestine without                        perforation or abscess without bleeding CPT copyright 2016 [employer]. All rights reserved. The codes documented in this report are preliminary and upon coder review may  be revised to meet current compliance requirements. Jonathon Bellows, MD Jonathon Bellows MD, MD 05/04/2016 9:40:42 AM This report has been signed electronically. Number of Addenda: 0 Note Initiated On: 05/04/2016 9:13 AM Scope Withdrawal Time: 0 hours 14 minutes 14 seconds  Total Procedure Duration: 0 hours 18 minutes 46 seconds       [facility]

## 2016-05-04 NOTE — Anesthesia Preprocedure Evaluation (Signed)
Anesthesia Evaluation  Patient identified by MRN, date of birth, ID band Patient awake    Reviewed: Allergy & Precautions, H&P , NPO status , Patient's Chart, lab work & pertinent test results, reviewed documented beta blocker date and time   Airway Mallampati: II   Neck ROM: full    Dental  (+) Poor Dentition   Pulmonary neg pulmonary ROS, former smoker,    Pulmonary exam normal        Cardiovascular Exercise Tolerance: Good hypertension, On Medications (-) anginanegative cardio ROS Normal cardiovascular exam+ Valvular Problems/Murmurs MVP  Rhythm:regular Rate:Normal     Neuro/Psych  Neuromuscular disease negative neurological ROS  negative psych ROS   GI/Hepatic negative GI ROS, Neg liver ROS,   Endo/Other  negative endocrine ROS  Renal/GU Renal diseasenegative Renal ROS  negative genitourinary   Musculoskeletal   Abdominal   Peds  Hematology negative hematology ROS (+)   Anesthesia Other Findings Past Medical History: No date: Arrhythmia     Comment: possible hx, resolved prev 1972: Blood transfusion without reported diagnosis     Comment: had reaction; had to stop No date: Carpal tunnel syndrome 2012: H/O exercise stress test     Comment: normal No date: Heart murmur     Comment: as child No date: High cholesterol No date: History of kidney stones No date: Hx of colonic polyps No date: Hypertension 2010: Osteoporosis     Comment: t score - 3.9 No date: Renal stones Past Surgical History: 1972: ABDOMINAL HYSTERECTOMY     Comment: for endometriosis No date: LITHOTRIPSY     Comment: for kidney stone x5   Reproductive/Obstetrics negative OB ROS                             Anesthesia Physical Anesthesia Plan  ASA: II  Anesthesia Plan: General   Post-op Pain Management:    Induction:   Airway Management Planned:   Additional Equipment:   Intra-op Plan:    Post-operative Plan:   Informed Consent: I have reviewed the patients History and Physical, chart, labs and discussed the procedure including the risks, benefits and alternatives for the proposed anesthesia with the patient or authorized representative who has indicated his/her understanding and acceptance.   Dental Advisory Given  Plan Discussed with: CRNA  Anesthesia Plan Comments:         Anesthesia Quick Evaluation

## 2016-05-04 NOTE — H&P (Signed)
Jonathon Bellows MD [address], [place], [place] [address] Phone: [phone] Fax : [phone]  Primary Care Physician:  Elsie Stain, MD Primary Gastroenterologist:  Dr. Jonathon Bellows   Pre-Procedure History & Physical: HPI:  Patricia Hoover is a 71 y.o. female is here for an colonoscopy.   Past Medical History:  Diagnosis Date  . Arrhythmia    possible hx, resolved prev  . Blood transfusion without reported diagnosis 1972   had reaction; had to stop  . Carpal tunnel syndrome   . H/O exercise stress test 2012   normal  . Heart murmur    as child  . High cholesterol   . History of kidney stones   . Hx of colonic polyps   . Hypertension   . Osteoporosis 2010   t score - 3.9  . Renal stones     Past Surgical History:  Procedure Laterality Date  . ABDOMINAL HYSTERECTOMY  1972   for endometriosis  . LITHOTRIPSY     for kidney stone x5    Prior to Admission medications   Medication Sig Start Date End Date Taking? Authorizing Provider  aspirin 81 MG tablet Take 81 mg by mouth daily.   Yes Historical Provider, MD  cholecalciferol (VITAMIN D) 1000 units tablet Take 2 tablets (2,000 Units total) by mouth daily. 12/16/15  Yes Tonia Ghent, MD  ibuprofen (ADVIL,MOTRIN) 800 MG tablet Take 1 tablet (800 mg total) by mouth 3 (three) times daily as needed (with food.). 12/16/15  Yes Tonia Ghent, MD  lisinopril-hydrochlorothiazide (PRINZIDE,ZESTORETIC) 10-12.5 MG tablet Take 1 tablet by mouth daily. Dispense med produced by [employer] 12/16/15  Yes Tonia Ghent, MD  simvastatin (ZOCOR) 10 MG tablet Take 1 tablet (10 mg total) by mouth every other day. 12/16/15  Yes Tonia Ghent, MD  tamsulosin (FLOMAX) 0.4 MG CAPS capsule Take 1 capsule (0.4 mg total) by mouth daily after breakfast. 01/31/16  Yes Earleen Newport, MD  ondansetron (ZOFRAN ODT) 4 MG disintegrating tablet Take 1 tablet (4 mg total) by mouth every 8 (eight) hours as needed for nausea or vomiting. Patient not  taking: Reported on 03/24/2016 01/31/16   Earleen Newport, MD  oxyCODONE-acetaminophen (PERCOCET) 5-325 MG tablet Take 2 tablets by mouth every 6 (six) hours as needed for moderate pain or severe pain. Patient not taking: Reported on 03/24/2016 01/31/16   Earleen Newport, MD  pantoprazole (PROTONIX) 20 MG tablet Take 1 tablet (20 mg total) by mouth daily. Patient not taking: Reported on 04/03/2016 12/16/15   Tonia Ghent, MD    Allergies as of 04/20/2016 - Review Complete 04/20/2016  Allergen Reaction Noted  . Apple Other (See Comments) 10/25/2014  . Bisphosphonates  08/18/2012  . Prolia [denosumab]  06/21/2013    Family History  Problem Relation Age of Onset  . Heart disease Mother   . Renal Disease Mother   . Heart failure Mother   . Colon cancer Neg Hx   . Breast cancer Neg Hx     Social History   Social History  . Marital status: Widowed    Spouse name: N/A  . Number of children: N/A  . Years of education: N/A   Occupational History  . Retired     Community education officer   Social History Main Topics  . Smoking status: Former Smoker    Types: Cigarettes    Quit date: 01/19/1991  . Smokeless tobacco: Never Used  . Alcohol use No  Comment: occasional  . Drug use: No  . Sexual activity: No   Other Topics Concern  . Not on file   Social History Narrative   Education:  1 year Business School   Retired back to [employer] after working in [employer], [employer]   Widowed '93   2 kids    Review of Systems: See HPI, otherwise negative ROS  Physical Exam: BP 120/82   Pulse 98   Temp 97.9 F (36.6 C) (Tympanic)   Resp 20   SpO2 99%  General:   Alert,  pleasant and cooperative in NAD Head:  Normocephalic and atraumatic. Neck:  Supple; no masses or thyromegaly. Lungs:  Clear throughout to auscultation.    Heart:  Regular rate and rhythm. Abdomen:  Soft, nontender and nondistended. Normal bowel sounds, without guarding, and without rebound.     Neurologic:  Alert and  oriented x4;  grossly normal neurologically.  Impression/Plan: Patricia Hoover is here for an colonoscopy to be performed for rectal bleeding   Risks, benefits, limitations, and alternatives regarding  colonoscopy have been reviewed with the patient.  Questions have been answered.  All parties agreeable.   Jonathon Bellows, MD  05/04/2016, 9:04 AM

## 2016-05-05 ENCOUNTER — Encounter: Payer: Self-pay | Admitting: Gastroenterology

## 2016-05-05 LAB — SURGICAL PATHOLOGY

## 2016-05-12 ENCOUNTER — Encounter: Payer: Self-pay | Admitting: Gastroenterology

## 2016-05-17 NOTE — Anesthesia Postprocedure Evaluation (Signed)
Anesthesia Post Note  Patient: Engineer, structural  Procedure(s) Performed: Procedure(s) (LRB): COLONOSCOPY WITH PROPOFOL (N/A)  Patient location during evaluation: PACU Anesthesia Type: General Level of consciousness: awake and alert Pain management: pain level controlled Vital Signs Assessment: post-procedure vital signs reviewed and stable Respiratory status: spontaneous breathing, nonlabored ventilation, respiratory function stable and patient connected to nasal cannula oxygen Cardiovascular status: blood pressure returned to baseline and stable Postop Assessment: no signs of nausea or vomiting Anesthetic complications: no     Last Vitals:  Vitals:   05/04/16 0950 05/04/16 1010  BP: 121/78 113/74  Pulse: 90 85  Resp: (!) 27 (!) 31  Temp:      Last Pain:  Vitals:   05/05/16 0740  TempSrc:   PainSc: 0-No pain                 Molli Barrows

## 2016-06-17 DIAGNOSIS — N281 Cyst of kidney, acquired: Secondary | ICD-10-CM | POA: Diagnosis not present

## 2016-06-17 DIAGNOSIS — N200 Calculus of kidney: Secondary | ICD-10-CM | POA: Diagnosis not present

## 2016-08-18 DIAGNOSIS — N200 Calculus of kidney: Secondary | ICD-10-CM | POA: Diagnosis not present

## 2016-08-18 DIAGNOSIS — R109 Unspecified abdominal pain: Secondary | ICD-10-CM | POA: Diagnosis not present

## 2016-09-21 ENCOUNTER — Other Ambulatory Visit: Payer: Self-pay | Admitting: Family Medicine

## 2016-09-21 DIAGNOSIS — Z1231 Encounter for screening mammogram for malignant neoplasm of breast: Secondary | ICD-10-CM

## 2016-09-27 ENCOUNTER — Ambulatory Visit
Admission: RE | Admit: 2016-09-27 | Discharge: 2016-09-27 | Disposition: A | Discharge status: Home / Self Care | Payer: Medicare Other | Source: Ambulatory Visit | Attending: Family Medicine | Admitting: Family Medicine

## 2016-09-27 DIAGNOSIS — Z1231 Encounter for screening mammogram for malignant neoplasm of breast: Secondary | ICD-10-CM | POA: Diagnosis not present

## 2016-09-30 ENCOUNTER — Encounter: Payer: Self-pay | Admitting: *Deleted

## 2016-10-04 ENCOUNTER — Encounter: Payer: Self-pay | Admitting: Family Medicine

## 2016-10-04 ENCOUNTER — Ambulatory Visit (INDEPENDENT_AMBULATORY_CARE_PROVIDER_SITE_OTHER): Payer: Medicare Other | Admitting: Family Medicine

## 2016-10-04 VITALS — BP 110/70 | HR 88 | Temp 98.5°F | Ht <= 58 in | Wt 168.2 lb

## 2016-10-04 DIAGNOSIS — M65312 Trigger thumb, left thumb: Secondary | ICD-10-CM | POA: Diagnosis not present

## 2016-10-04 NOTE — Progress Notes (Signed)
Dr. Frederico Hamman T. Marcin Holte, MD, [facility] [place], [address] Phone: [phone] Fax: [phone]  10/04/2016  Patient: Patricia Hoover, MRN: [id number], DOB: 1945/04/08, 71 y.o.  Primary Physician:  Tonia Ghent, MD   Chief Complaint  Patient presents with  . Trigger Finger    Left Thumb   Subjective:   Nakiyah Beverley is a 71 y.o. very pleasant female patient who presents with the following:  L thumb trigger finger:  She has a intermittent L trigger thumb that is not tender.   Past Medical History, Surgical History, Social History, Family History, Problem List, Medications, and Allergies have been reviewed and updated if relevant.  Patient Active Problem List   Diagnosis Date Noted  . Kidney stone 04/20/2016  . Hydronephrosis of left kidney 03/11/2016  . Ureteral calculus, left 03/11/2016  . Bullous lesion 02/18/2016  . Heartburn 12/17/2015  . Biceps tendonitis 10/30/2014  . Rash and nonspecific skin eruption 09/03/2014  . Pain in the chest 02/08/2014  . Exertional shortness of breath 02/01/2014  . Medicare annual wellness visit, subsequent 06/05/2013  . Hyperglycemia 06/05/2013  . Chest wall pain 04/13/2013  . Tennis elbow 02/19/2013  . Knee pain 08/31/2012  . Osteoporosis 08/18/2012  . HTN (hypertension) 08/09/2012  . HLD (hyperlipidemia) 08/09/2012    Past Medical History:  Diagnosis Date  . Arrhythmia    possible hx, resolved prev  . Blood transfusion without reported diagnosis 1972   had reaction; had to stop  . Carpal tunnel syndrome   . H/O exercise stress test 2012   normal  . Heart murmur    as child  . High cholesterol   . History of kidney stones   . Hx of colonic polyps   . Hypertension   . Osteoporosis 2010   t score - 3.9  . Renal stones     Past Surgical History:  Procedure Laterality Date  . ABDOMINAL HYSTERECTOMY  1972   for endometriosis  . COLONOSCOPY WITH PROPOFOL N/A  05/04/2016   Procedure: COLONOSCOPY WITH PROPOFOL;  Surgeon: Jonathon Bellows, MD;  Location[facility] END[facility]COPY;  Service: Endoscopy;  Laterality: N/A;  . LITHOTRIPSY     for kidney stone x5    Social History   Social History  . Marital status: Widowed    Spouse name: N/A  . Number of children: N/A  . Years of education: N/A   Occupational History  . Retired     Community education officer   Social History Main Topics  . Smoking status: Former Smoker    Types: Cigarettes    Quit date: 01/19/1991  . Smokeless tobacco: Never Used  . Alcohol use No     Comment: occasional  . Drug use: No  . Sexual activity: No   Other Topics Concern  . Not on file   Social History Narrative   Education:  1 year Business School   Retired back to [employer] after working in [employer], [employer]   Widowed '93   2 kids    Family History  Problem Relation Age of Onset  . Heart disease Mother   . Renal Disease Mother   . Heart failure Mother   . Colon cancer Neg Hx   . Breast cancer Neg Hx     Allergies  Allergen Reactions  . Apple Other (See Comments)    heartburn  . Bisphosphonates     GI side effects GI  side effects  . Prolia [Denosumab]     GI upset GI upset    Medication list reviewed and updated in full in [place].  GEN: No fevers, chills. Nontoxic. Primarily MSK c/o today. MSK: Detailed in the HPI GI: tolerating PO intake without difficulty Neuro: No numbness, parasthesias, or tingling associated. Otherwise the pertinent positives of the ROS are noted above.   Objective:   BP 110/70   Pulse 88   Temp 98.5 F (36.9 C) (Oral)   Ht 4\' 10"  (1.473 m)   Wt 168 lb 4 oz (76.3 kg)   BMI 35.16 kg/m    GEN: WDWN, NAD, Non-toxic, Alert & Oriented x 3 HEENT: Atraumatic, Normocephalic.  Ears and Nose: No external deformity. EXTR: No clubbing/cyanosis/edema NEURO: Normal gait.  PSYCH: Normally interactive. Conversant. Not depressed or anxious appearing.  Calm  demeanor.   L hand Ecchymosis or edema: neg ROM wrist/hand/digits: full  Carpals, MCP's, digits: NT Distal Ulna and Radius: NT Ecchymosis or edema: neg No instability Cysts/nodules: neg Digit triggering: L thumb Finkelstein's test: neg Snuffbox tenderness: neg Scaphoid tubercle: NT Resisted supination: NT Full composite fist, no malrotation Grip, all digits: 5/5 str DIPJT: NT PIP JT: NT MCP JT: NT No tenosynovitis Axial load test: neg Phalen's: neg Tinel's: neg Atrophy: neg  Hand sensation: intact   Radiology:  Assessment and Plan:   Trigger thumb, left thumb   She does not want any intervention at this point, which I think is reasonable.  Work on ROM  Follow-up: No Follow-up on file.  Future Appointments Date Time Provider [place]  12/13/2016 1:45 PM Eustace Pen, LPN [facility]  12/20/2016 3:00 PM Tonia Ghent, MD [facility]   Medications Discontinued During This Encounter  Medication Reason  . tamsulosin (FLOMAX) 0.4 MG CAPS capsule One time medication  . pantoprazole (PROTONIX) 20 MG tablet One time medication  . oxyCODONE-acetaminophen (PERCOCET) 5-325 MG tablet One time medication  . ondansetron (ZOFRAN ODT) 4 MG disintegrating tablet One time medication   Signed,  Wil Slape T. Bransyn Adami, MD   Patient's Medications  New Prescriptions   No medications on file  Previous Medications   ASPIRIN 81 MG TABLET    Take 81 mg by mouth daily.   CHOLECALCIFEROL (VITAMIN D) 1000 UNITS TABLET    Take 2 tablets (2,000 Units total) by mouth daily.   IBUPROFEN (ADVIL,MOTRIN) 800 MG TABLET    Take 1 tablet (800 mg total) by mouth 3 (three) times daily as needed (with food.).   LISINOPRIL-HYDROCHLOROTHIAZIDE (PRINZIDE,ZESTORETIC) 10-12.5 MG TABLET    Take 1 tablet by mouth daily. Dispense med produced by [employer]   SIMVASTATIN (ZOCOR) 10 MG TABLET    Take 1 tablet (10 mg total) by mouth every other day.  Modified Medications   No  medications on file  Discontinued Medications   ONDANSETRON (ZOFRAN ODT) 4 MG DISINTEGRATING TABLET    Take 1 tablet (4 mg total) by mouth every 8 (eight) hours as needed for nausea or vomiting.   OXYCODONE-ACETAMINOPHEN (PERCOCET) 5-325 MG TABLET    Take 2 tablets by mouth every 6 (six) hours as needed for moderate pain or severe pain.   PANTOPRAZOLE (PROTONIX) 20 MG TABLET    Take 1 tablet (20 mg total) by mouth daily.   TAMSULOSIN (FLOMAX) 0.4 MG CAPS CAPSULE    Take 1 capsule (0.4 mg total) by mouth daily after breakfast.

## 2016-12-12 ENCOUNTER — Other Ambulatory Visit: Payer: Self-pay | Admitting: Family Medicine

## 2016-12-12 DIAGNOSIS — M810 Age-related osteoporosis without current pathological fracture: Secondary | ICD-10-CM

## 2016-12-12 DIAGNOSIS — E785 Hyperlipidemia, unspecified: Secondary | ICD-10-CM

## 2016-12-13 ENCOUNTER — Telehealth: Payer: Self-pay

## 2016-12-13 ENCOUNTER — Ambulatory Visit (INDEPENDENT_AMBULATORY_CARE_PROVIDER_SITE_OTHER): Payer: Medicare Other

## 2016-12-13 VITALS — BP 126/80 | HR 78 | Temp 98.5°F | Ht <= 58 in | Wt 169.5 lb

## 2016-12-13 DIAGNOSIS — Z0000 Encounter for general adult medical examination without abnormal findings: Secondary | ICD-10-CM

## 2016-12-13 DIAGNOSIS — M810 Age-related osteoporosis without current pathological fracture: Secondary | ICD-10-CM | POA: Diagnosis not present

## 2016-12-13 DIAGNOSIS — Z23 Encounter for immunization: Secondary | ICD-10-CM | POA: Diagnosis not present

## 2016-12-13 DIAGNOSIS — E785 Hyperlipidemia, unspecified: Secondary | ICD-10-CM | POA: Diagnosis not present

## 2016-12-13 LAB — COMPREHENSIVE METABOLIC PANEL
ALBUMIN: 3.9 g/dL (ref 3.5–5.2)
ALK PHOS: 73 U/L (ref 39–117)
ALT: 14 U/L (ref 0–35)
AST: 15 U/L (ref 0–37)
BILIRUBIN TOTAL: 0.3 mg/dL (ref 0.2–1.2)
BUN: 15 mg/dL (ref 6–23)
CALCIUM: 9.1 mg/dL (ref 8.4–10.5)
CO2: 29 meq/L (ref 19–32)
Chloride: 105 mEq/L (ref 96–112)
Creatinine, Ser: 0.62 mg/dL (ref 0.40–1.20)
GFR: 121.63 mL/min (ref >60.00)
Glucose, Bld: 108 mg/dL — ABNORMAL HIGH (ref 70–99)
Potassium: 3.6 mEq/L (ref 3.5–5.1)
Sodium: 141 mEq/L (ref 135–145)
Total Protein: 6.8 g/dL (ref 6.0–8.3)

## 2016-12-13 LAB — LIPID PANEL
CHOL/HDL RATIO: 5
CHOLESTEROL: 216 mg/dL — AB (ref 0–200)
HDL: 43 mg/dL (ref >39.00)
NonHDL: 172.67
TRIGLYCERIDES: 218 mg/dL — AB (ref 0.0–149.0)
VLDL: 43.6 mg/dL — AB (ref 0.0–40.0)

## 2016-12-13 LAB — LDL CHOLESTEROL, DIRECT: LDL DIRECT: 140 mg/dL

## 2016-12-13 LAB — VITAMIN D 25 HYDROXY (VIT D DEFICIENCY, FRACTURES): VITD: 36.1 ng/mL (ref 30.00–100.00)

## 2016-12-13 NOTE — Progress Notes (Signed)
PCP notes:   Health maintenance:  Flu vaccine - administered  Abnormal screenings:   None  Patient concerns:   Patient wants to discuss effects of long-term use of Aspirin and Simvastatin.   Nurse concerns:  None  Next PCP appt:   12/20/16 @ 1500  I reviewed health advisor's note, was available for consultation on the day of service listed in this note, and agree with documentation and plan. Elsie Stain, MD.

## 2016-12-13 NOTE — Progress Notes (Signed)
Pre visit review using our clinic review tool, if applicable. No additional management support is needed unless otherwise documented below in the visit note. 

## 2016-12-13 NOTE — Patient Instructions (Addendum)
Patricia Hoover , Thank you for taking time to come for your Medicare Wellness Visit. I appreciate your ongoing commitment to your health goals. Please review the following plan we discussed and let me know if I can assist you in the future.   These are the goals we discussed: Goals    . Increase physical activity     Starting 12/13/2016, I will continue to do line dancing for 60 min 2 days per week.        This is a list of the screening recommended for you and due dates:  Health Maintenance  Topic Date Due  . Mammogram  09/28/2018  . Colon Cancer Screening  05/04/2021  . Tetanus Vaccine  07/05/2021  . Flu Shot  Completed  . DEXA scan (bone density measurement)  Completed  .  Hepatitis C: One time screening is recommended by Center for Disease Control  (CDC) for  adults born from [birth year] through 1966.   Completed  . Pneumonia vaccines  Completed     Preventive Care for Adults  A healthy lifestyle and preventive care can promote health and wellness. Preventive health guidelines for adults include the following key practices.  . A routine yearly physical is a good way to check with your health care provider about your health and preventive screening. It is a chance to share any concerns and updates on your health and to receive a thorough exam.  . Visit your dentist for a routine exam and preventive care every 6 months. Brush your teeth twice a day and floss once a day. Good oral hygiene prevents tooth decay and gum disease.  . The frequency of eye exams is based on your age, health, family medical history, use  of contact lenses, and other factors. Follow your health care provider's recommendations for frequency of eye exams.  . Eat a healthy diet. Foods like vegetables, fruits, whole grains, low-fat dairy products, and lean protein foods contain the nutrients you need without too many calories. Decrease your intake of foods high in solid fats, added sugars, and salt. Eat the right amount  of calories for you. Get information about a proper diet from your health care provider, if necessary.  . Regular physical exercise is one of the most important things you can do for your health. Most adults should get at least 150 minutes of moderate-intensity exercise (any activity that increases your heart rate and causes you to sweat) each week. In addition, most adults need muscle-strengthening exercises on 2 or more days a week.  Silver Sneakers may be a benefit available to you. To determine eligibility, you may visit the website: www.silversneakers.com or contact program at (610)681-0458 Mon-Fri between 8AM-8PM.   . Maintain a healthy weight. The body mass index (BMI) is a screening tool to identify possible weight problems. It provides an estimate of body fat based on height and weight. Your health care provider can find your BMI and can help you achieve or maintain a healthy weight.   For adults 20 years and older: ? A BMI below 18.5 is considered underweight. ? A BMI of 18.5 to 24.9 is normal. ? A BMI of 25 to 29.9 is considered overweight. ? A BMI of 30 and above is considered obese.   . Maintain normal blood lipids and cholesterol levels by exercising and minimizing your intake of saturated fat. Eat a balanced diet with plenty of fruit and vegetables. Blood tests for lipids and cholesterol should begin at age 82 and be  repeated every 5 years. If your lipid or cholesterol levels are high, you are over 50, or you are at high risk for heart disease, you may need your cholesterol levels checked more frequently. Ongoing high lipid and cholesterol levels should be treated with medicines if diet and exercise are not working.  . If you smoke, find out from your health care provider how to quit. If you do not use tobacco, please do not start.  . If you choose to drink alcohol, please do not consume more than 2 drinks per day. One drink is considered to be 12 ounces (355 mL) of beer, 5 ounces  (148 mL) of wine, or 1.5 ounces (44 mL) of liquor.  . If you are 57-66 years old, ask your health care provider if you should take aspirin to prevent strokes.  . Use sunscreen. Apply sunscreen liberally and repeatedly throughout the day. You should seek shade when your shadow is shorter than you. Protect yourself by wearing long sleeves, pants, a wide-brimmed hat, and sunglasses year round, whenever you are outdoors.  . Once a month, do a whole body skin exam, using a mirror to look at the skin on your back. Tell your health care provider of new moles, moles that have irregular borders, moles that are larger than a pencil eraser, or moles that have changed in shape or color.

## 2016-12-13 NOTE — Progress Notes (Signed)
Subjective:   Patricia Hoover is a 71 y.o. female who presents for Medicare Annual (Subsequent) preventive examination.  Review of Systems:  N/A Cardiac Risk Factors include: advanced age (>62men, >60 women);obesity (BMI >30kg/m2);dyslipidemia;hypertension     Objective:     Vitals: BP 126/80 (BP Location: Right Arm, Patient Position: Sitting, Cuff Size: Normal)   Pulse 78   Temp 98.5 F (36.9 C) (Oral)   Ht 4' 9.5" (1.461 m) Comment: no shoes  Wt 169 lb 8 oz (76.9 kg)   SpO2 98%   BMI 36.04 kg/m   Body mass index is 36.04 kg/m.   Tobacco Social History   Tobacco Use  Smoking Status Former Smoker  . Types: Cigarettes  . Last attempt to quit: 01/19/1991  . Years since quitting: 25.9  Smokeless Tobacco Never Used     Counseling given: No   Past Medical History:  Diagnosis Date  . Arrhythmia    possible hx, resolved prev  . Blood transfusion without reported diagnosis 1972   had reaction; had to stop  . Carpal tunnel syndrome   . H/O exercise stress test 2012   normal  . Heart murmur    as child  . High cholesterol   . History of kidney stones   . Hx of colonic polyps   . Hypertension   . Osteoporosis 2010   t score - 3.9  . Renal stones    Past Surgical History:  Procedure Laterality Date  . ABDOMINAL HYSTERECTOMY  1972   for endometriosis  . COLONOSCOPY WITH PROPOFOL N/A 05/04/2016   Procedure: COLONOSCOPY WITH PROPOFOL;  Surgeon: Jonathon Bellows, MD;  Location: [facility] ENDOSCOPY;  Service: Endoscopy;  Laterality: N/A;  . LITHOTRIPSY     for kidney stone x5   Family History  Problem Relation Age of Onset  . Heart disease Mother   . Renal Disease Mother   . Heart failure Mother   . Colon cancer Neg Hx   . Breast cancer Neg Hx    Social History   Substance and Sexual Activity  Sexual Activity No    Outpatient Encounter Medications as of 12/13/2016  Medication Sig  . aspirin 81 MG tablet Take 81 mg by mouth daily.  . cholecalciferol (VITAMIN D) 1000  units tablet Take 2 tablets (2,000 Units total) by mouth daily.  Marland Kitchen ibuprofen (ADVIL,MOTRIN) 800 MG tablet Take 1 tablet (800 mg total) by mouth 3 (three) times daily as needed (with food.).  Marland Kitchen lisinopril-hydrochlorothiazide (PRINZIDE,ZESTORETIC) 10-12.5 MG tablet Take 1 tablet by mouth daily. Dispense med produced by [employer]  . simvastatin (ZOCOR) 10 MG tablet Take 1 tablet (10 mg total) by mouth every other day.   No facility-administered encounter medications on file as of 12/13/2016.     Activities of Daily Living In your present state of health, do you have any difficulty performing the following activities: 12/13/2016  Hearing? N  Vision? N  Difficulty concentrating or making decisions? N  Walking or climbing stairs? N  Dressing or bathing? N  Doing errands, shopping? N  Preparing Food and eating ? N  Using the Toilet? N  In the past six months, have you accidently leaked urine? N  Do you have problems with loss of bowel control? N  Managing your Medications? N  Managing your Finances? N  Housekeeping or managing your Housekeeping? N  Some recent data might be hidden    Patient Care Team: Tonia Ghent, MD as PCP - General (Family Medicine) Phineas Douglas  J, OD as Consulting Physician (Optometry)    Assessment:     Hearing Screening   125Hz  250Hz  500Hz  1000Hz  2000Hz  3000Hz  4000Hz  6000Hz  8000Hz   Right ear:   40 40 40  40    Left ear:   40 40 40  40      Visual Acuity Screening   Right eye Left eye Both eyes  Without correction:     With correction: 20/40 20/40 20/40     Exercise Activities and Dietary recommendations Current Exercise Habits: Home exercise routine, Type of exercise: Other - see comments(line dancing), Time (Minutes): 60, Frequency (Times/Week): 2, Weekly Exercise (Minutes/Week): 120, Intensity: Moderate, Exercise limited by: None identified  Goals    . Increase physical activity     Starting 12/13/2016, I will continue to do line dancing for 60 min  2 days per week.       Fall Risk Fall Risk  12/13/2016 12/10/2015 10/25/2014 06/04/2013  Falls in the past year? No No No No   Depression Screen PHQ 2/9 Scores 12/13/2016 12/10/2015 10/25/2014 06/04/2013  PHQ - 2 Score 0 0 0 0  PHQ- 9 Score 0 - - -     Cognitive Function MMSE - Mini Mental State Exam 12/13/2016 12/10/2015  Orientation to time 5 5  Orientation to Place 5 5  Registration 3 3  Attention/ Calculation 0 0  Recall 3 3  Language- name 2 objects 0 0  Language- repeat 1 1  Language- follow 3 step command 3 3  Language- read & follow direction 0 0  Write a sentence 0 0  Copy design 0 0  Total score 20 20     PLEASE NOTE: A Mini-Cog screen was completed. Maximum score is 20. A value of 0 denotes this part of Folstein MMSE was not completed or the patient failed this part of the Mini-Cog screening.   Mini-Cog Screening Orientation to Time - Max 5 pts Orientation to Place - Max 5 pts Registration - Max 3 pts Recall - Max 3 pts Language Repeat - Max 1 pts Language Follow 3 Step Command - Max 3 pts     Immunization History  Administered Date(s) Administered  . Influenza,inj,Quad PF,6+ Mos 10/25/2014, 12/10/2015, 12/13/2016  . Influenza-Unspecified 10/10/2013  . Pneumococcal Conjugate-13 10/25/2014  . Pneumococcal Polysaccharide-23 06/04/2013  . Tdap 07/06/2011  . Zoster 07/06/2011   Screening Tests Health Maintenance  Topic Date Due  . MAMMOGRAM  09/28/2018  . COLONOSCOPY  05/04/2021  . TETANUS/TDAP  07/05/2021  . INFLUENZA VACCINE  Completed  . DEXA SCAN  Completed  . Hepatitis C Screening  Completed  . PNA vac Low Risk Adult  Completed      Plan:   I have personally reviewed, addressed, and noted the following in the patient's chart:  A. Medical and social history B. Use of alcohol, tobacco or illicit drugs  C. Current medications and supplements D. Functional ability and status E.  Nutritional status F.  Physical activity G. Advance  directives H. List of other physicians I.  Hospitalizations, surgeries, and ER visits in previous 12 months J.  [place] to include hearing, vision, cognitive, depression L. Referrals and appointments - none  In addition, I have reviewed and discussed with patient certain preventive protocols, quality metrics, and best practice recommendations. A written personalized care plan for preventive services as well as general preventive health recommendations were provided to patient.  See attached scanned questionnaire for additional information.   Signed,   Lindell Noe,  MHA, BS, LPN Health Coach

## 2016-12-13 NOTE — Telephone Encounter (Signed)
Patient has requested a new prescription for Ibuprofen. Patient is requesting a 200 mg tablet because the 800 mg tablet is too strong. Desires to take 1-2 200 mg tablets as needed.   Patient has requested a prescription for a stair lifter because she cannot walk up stairs. States she crawls on all fours due to knee pain. States last pain injection lasted one month. Does not desire to get another injection.

## 2016-12-14 MED ORDER — IBUPROFEN 200 MG PO TABS: 200.0000 mg | ORAL_TABLET | Freq: Three times a day (TID) | ORAL | 1 refills | Status: DC | PRN

## 2016-12-14 NOTE — Telephone Encounter (Signed)
Patient advised.

## 2016-12-14 NOTE — Telephone Encounter (Signed)
Ibuprofen rx sent t [facility], may be cheaper otc.  Have her remind me and we'll talk about knee pain/etc at the [facility].   Thanks.

## 2016-12-20 ENCOUNTER — Encounter: Payer: Self-pay | Admitting: Family Medicine

## 2016-12-20 ENCOUNTER — Ambulatory Visit (INDEPENDENT_AMBULATORY_CARE_PROVIDER_SITE_OTHER): Payer: Medicare Other | Admitting: Family Medicine

## 2016-12-20 VITALS — BP 126/80 | HR 78 | Temp 98.5°F | Ht <= 58 in | Wt 169.5 lb

## 2016-12-20 DIAGNOSIS — I10 Essential (primary) hypertension: Secondary | ICD-10-CM | POA: Diagnosis not present

## 2016-12-20 DIAGNOSIS — M25561 Pain in right knee: Secondary | ICD-10-CM

## 2016-12-20 DIAGNOSIS — M25562 Pain in left knee: Secondary | ICD-10-CM | POA: Diagnosis not present

## 2016-12-20 DIAGNOSIS — M810 Age-related osteoporosis without current pathological fracture: Secondary | ICD-10-CM

## 2016-12-20 DIAGNOSIS — G8929 Other chronic pain: Secondary | ICD-10-CM

## 2016-12-20 DIAGNOSIS — E785 Hyperlipidemia, unspecified: Secondary | ICD-10-CM

## 2016-12-20 DIAGNOSIS — Z7189 Other specified counseling: Secondary | ICD-10-CM

## 2016-12-20 MED ORDER — SIMVASTATIN 10 MG PO TABS: 10.0000 mg | ORAL_TABLET | Freq: Every day | ORAL | 3 refills | Status: DC

## 2016-12-20 MED ORDER — LISINOPRIL-HYDROCHLOROTHIAZIDE 10-12.5 MG PO TABS: 1.0000 | ORAL_TABLET | Freq: Every day | ORAL | 3 refills | Status: DC

## 2016-12-20 MED ORDER — IBUPROFEN 200 MG PO TABS: 200.0000 mg | ORAL_TABLET | Freq: Three times a day (TID) | ORAL | 1 refills | Status: DC | PRN

## 2016-12-20 NOTE — Patient Instructions (Signed)
Take care.  Glad to see you.  Update me as needed.  If you need a letter documenting your knee pain, then let me know.  Get back on the cholesterol medicine daily.   Continue BP medicine.

## 2016-12-20 NOTE — Progress Notes (Signed)
Hypertension:    Using medication without problems or lightheadedness: yes Chest pain with exertion:no Edema:no Short of breath:no Labs d/w pt.    Elevated Cholesterol: Using medications without problems: yes had stopped med and wanted to see her labs off med.   Muscle aches: no Diet compliance:encouraged.  Exercise: encouraged.  D/w pt about dancing, walking.  Lipids up, but she was off med.   Osteoporosis but intolerant of mult meds, d/w pt.  She agrees to defer.  Vit D wnl.    Advance directive dw pt. Odetta Pink designated if patient were incapacitated.   Sig knee pain going up steps. D/w pt about options.  S/p injection per ortho last year w/o sig relief.   Meds, vitals, and allergies reviewed.   PMH and SH reviewed  ROS: Per HPI unless specifically indicated in ROS section   GEN: nad, alert and oriented HEENT: mucous membranes moist NECK: supple w/o LA CV: rrr. PULM: ctab, no inc wob ABD: soft, +bs EXT: no edema SKIN: no acute rash B knee crepitus on ROM.

## 2016-12-22 ENCOUNTER — Telehealth: Payer: Self-pay | Admitting: Family Medicine

## 2016-12-22 NOTE — Telephone Encounter (Signed)
Patient notified as instructed by telephone and verbalized understanding. 

## 2016-12-22 NOTE — Telephone Encounter (Signed)
Copied from [place]. Topic: Quick Communication - See Telephone Encounter >> Dec 21, 2016  9:54 AM Arletha Grippe wrote: CRM for notification. See Telephone encounter for:   12/21/16. Pt called - she is wanting to know if a urine test is part of the AWV, she just had one done.  She wants to knows if she has trouble with kidney stones, what test can be done with the AWV to make sure kidneys are in order.  Or does she need to follow up with urologist? She is not having issues at this time.  Cb number is [phone]

## 2016-12-22 NOTE — Telephone Encounter (Signed)
If no sx, then no urine test needed.  Kidney function normal by Cr done and discussed at [place].  No f/u imaging needed now.  Thanks.

## 2016-12-23 DIAGNOSIS — Z7189 Other specified counseling: Secondary | ICD-10-CM | POA: Insufficient documentation

## 2016-12-23 NOTE — Assessment & Plan Note (Signed)
Osteoporosis but intolerant of mult meds, d/w pt.  She agrees to defer.  Vit D wnl.

## 2016-12-23 NOTE — Assessment & Plan Note (Signed)
See AVS.  We can refer to ortho if needed.  She was asking about hardware at home.  She can check with DME/insurance re: coverage and update me as needed. She agrees.

## 2016-12-23 NOTE — Assessment & Plan Note (Signed)
continue work on diet and exercise and weight.  No change in meds.  Labs d/w pt. >25 minutes spent in face to face time with patient, >50% spent in counselling or coordination of care.

## 2016-12-23 NOTE — Assessment & Plan Note (Signed)
Lipids up, but she was off statin. Restart, continue work on diet and exercise and weight.

## 2016-12-23 NOTE — Assessment & Plan Note (Signed)
Advance directive dw pt. Patricia Hoover designated if patient were incapacitated.  °

## 2017-01-18 DIAGNOSIS — H269 Unspecified cataract: Secondary | ICD-10-CM

## 2017-01-18 HISTORY — DX: Unspecified cataract: H26.9

## 2017-02-25 ENCOUNTER — Ambulatory Visit (INDEPENDENT_AMBULATORY_CARE_PROVIDER_SITE_OTHER): Payer: Medicare Other | Admitting: Family Medicine

## 2017-02-25 ENCOUNTER — Encounter: Payer: Self-pay | Admitting: Family Medicine

## 2017-02-25 VITALS — BP 122/68 | HR 83 | Temp 98.0°F | Wt 168.5 lb

## 2017-02-25 DIAGNOSIS — R195 Other fecal abnormalities: Secondary | ICD-10-CM

## 2017-02-25 DIAGNOSIS — Z1211 Encounter for screening for malignant neoplasm of colon: Secondary | ICD-10-CM | POA: Diagnosis not present

## 2017-02-25 LAB — CBC WITH DIFFERENTIAL/PLATELET
Basophils Absolute: 46 cells/uL (ref 0–200)
Basophils Relative: 0.4 %
EOS ABS: 125 {cells}/uL (ref 15–500)
EOS PCT: 1.1 %
HCT: 37 % (ref 35.0–45.0)
HEMOGLOBIN: 13 g/dL (ref 11.7–15.5)
Lymphs Abs: 2907 cells/uL (ref 850–3900)
MCH: 29.7 pg (ref 27.0–33.0)
MCHC: 35.1 g/dL (ref 32.0–36.0)
MCV: 84.5 fL (ref 80.0–100.0)
MONOS PCT: 6.4 %
MPV: 12.4 fL (ref 7.5–12.5)
NEUTROS ABS: 7592 {cells}/uL (ref 1500–7800)
Neutrophils Relative %: 66.6 %
PLATELETS: 268 10*3/uL (ref 140–400)
RBC: 4.38 10*6/uL (ref 3.80–5.10)
RDW: 13 % (ref 11.0–15.0)
TOTAL LYMPHOCYTE: 25.5 %
WBC mixed population: 730 cells/uL (ref 200–950)
WBC: 11.4 10*3/uL — ABNORMAL HIGH (ref 3.8–10.8)

## 2017-02-25 NOTE — Patient Instructions (Signed)
Go to the lab on the way out.  We'll contact you with your lab report. Take care.  Glad to see you.  I'll can check with GI when I see your labs, if needed.

## 2017-02-25 NOTE — Progress Notes (Signed)
On asa 81mg  a day.   Possible blood in stool.  She possibly had some dark stools.  Not black but reddish brown, noted in the last few days.  No pain with BM.    Colonoscopy prev with: One 5 mm polyp in the ascending colon, removed with a cold biopsy forceps. Resected and retrieved. - One 7 mm polyp in the ascending colon, removed with a cold snare. Resected and retrieved. - Diverticulosis in the entire examined colon. - Non-bleeding internal hemorrhoids.  She has some abd pain, better with TUMS, episodically, no consistent/daily sx.  No vomiting.  No nausea.    No fevers.    Meds, vitals, and allergies reviewed.   ROS: Per HPI unless specifically indicated in ROS section   GEN: nad, alert and oriented HEENT: mucous membranes moist NECK: supple w/o LA CV: rrr. PULM: ctab, no inc wob ABD: soft, +bs EXT: no edema SKIN: no acute rash

## 2017-02-27 DIAGNOSIS — R195 Other fecal abnormalities: Secondary | ICD-10-CM | POA: Insufficient documentation

## 2017-02-27 NOTE — Assessment & Plan Note (Signed)
She did not have gross blood with bowel movements.  She does not have black tarry stools.  She does not have consistent abdominal pain.  Check routine labs today.  If her CBC is normal, if her fecal occult blood testing is negative, and if her symptoms resolve then likely no other treatment needed.  If she has abnormal testing or persistent symptoms then we can refer him to GI.  She agrees.  Okay for outpatient follow-up.  She agrees with plan.

## 2017-02-28 ENCOUNTER — Other Ambulatory Visit: Payer: Self-pay

## 2017-02-28 ENCOUNTER — Ambulatory Visit: Payer: Self-pay | Admitting: *Deleted

## 2017-02-28 ENCOUNTER — Inpatient Hospital Stay
Admission: EM | Admit: 2017-02-28 | Discharge: 2017-03-02 | DRG: 379 | Disposition: A | Discharge status: Home / Self Care | Payer: Medicare Other | Attending: Internal Medicine | Admitting: Internal Medicine

## 2017-02-28 ENCOUNTER — Encounter: Payer: Self-pay | Admitting: Medical Oncology

## 2017-02-28 DIAGNOSIS — T39315A Adverse effect of propionic acid derivatives, initial encounter: Secondary | ICD-10-CM | POA: Diagnosis present

## 2017-02-28 DIAGNOSIS — K269 Duodenal ulcer, unspecified as acute or chronic, without hemorrhage or perforation: Secondary | ICD-10-CM | POA: Diagnosis not present

## 2017-02-28 DIAGNOSIS — Z7982 Long term (current) use of aspirin: Secondary | ICD-10-CM

## 2017-02-28 DIAGNOSIS — K219 Gastro-esophageal reflux disease without esophagitis: Secondary | ICD-10-CM | POA: Diagnosis present

## 2017-02-28 DIAGNOSIS — K644 Residual hemorrhoidal skin tags: Secondary | ICD-10-CM | POA: Diagnosis not present

## 2017-02-28 DIAGNOSIS — K922 Gastrointestinal hemorrhage, unspecified: Secondary | ICD-10-CM | POA: Diagnosis not present

## 2017-02-28 DIAGNOSIS — K921 Melena: Secondary | ICD-10-CM

## 2017-02-28 DIAGNOSIS — I10 Essential (primary) hypertension: Secondary | ICD-10-CM | POA: Diagnosis present

## 2017-02-28 DIAGNOSIS — Z6834 Body mass index (BMI) 34.0-34.9, adult: Secondary | ICD-10-CM

## 2017-02-28 DIAGNOSIS — K3189 Other diseases of stomach and duodenum: Secondary | ICD-10-CM | POA: Diagnosis not present

## 2017-02-28 DIAGNOSIS — B9681 Helicobacter pylori [H. pylori] as the cause of diseases classified elsewhere: Secondary | ICD-10-CM | POA: Diagnosis not present

## 2017-02-28 DIAGNOSIS — Z87891 Personal history of nicotine dependence: Secondary | ICD-10-CM | POA: Diagnosis not present

## 2017-02-28 DIAGNOSIS — Z888 Allergy status to other drugs, medicaments and biological substances status: Secondary | ICD-10-CM | POA: Diagnosis not present

## 2017-02-28 DIAGNOSIS — K648 Other hemorrhoids: Secondary | ICD-10-CM | POA: Diagnosis not present

## 2017-02-28 DIAGNOSIS — K625 Hemorrhage of anus and rectum: Secondary | ICD-10-CM

## 2017-02-28 DIAGNOSIS — E7800 Pure hypercholesterolemia, unspecified: Secondary | ICD-10-CM | POA: Diagnosis present

## 2017-02-28 DIAGNOSIS — K449 Diaphragmatic hernia without obstruction or gangrene: Secondary | ICD-10-CM | POA: Diagnosis present

## 2017-02-28 DIAGNOSIS — D62 Acute posthemorrhagic anemia: Secondary | ICD-10-CM | POA: Diagnosis not present

## 2017-02-28 DIAGNOSIS — Z79899 Other long term (current) drug therapy: Secondary | ICD-10-CM | POA: Diagnosis not present

## 2017-02-28 DIAGNOSIS — Z91018 Allergy to other foods: Secondary | ICD-10-CM

## 2017-02-28 DIAGNOSIS — K2930 Chronic superficial gastritis without bleeding: Secondary | ICD-10-CM | POA: Diagnosis not present

## 2017-02-28 DIAGNOSIS — K5730 Diverticulosis of large intestine without perforation or abscess without bleeding: Secondary | ICD-10-CM | POA: Diagnosis not present

## 2017-02-28 DIAGNOSIS — M810 Age-related osteoporosis without current pathological fracture: Secondary | ICD-10-CM | POA: Diagnosis present

## 2017-02-28 DIAGNOSIS — E669 Obesity, unspecified: Secondary | ICD-10-CM | POA: Diagnosis present

## 2017-02-28 DIAGNOSIS — K5731 Diverticulosis of large intestine without perforation or abscess with bleeding: Principal | ICD-10-CM | POA: Diagnosis present

## 2017-02-28 DIAGNOSIS — Z8719 Personal history of other diseases of the digestive system: Secondary | ICD-10-CM | POA: Diagnosis not present

## 2017-02-28 DIAGNOSIS — E876 Hypokalemia: Secondary | ICD-10-CM | POA: Diagnosis present

## 2017-02-28 LAB — COMPREHENSIVE METABOLIC PANEL
ALBUMIN: 3.6 g/dL (ref 3.5–5.0)
ALK PHOS: 83 U/L (ref 38–126)
ALT: 12 U/L — ABNORMAL LOW (ref 14–54)
AST: 17 U/L (ref 15–41)
Anion gap: 12 (ref 5–15)
BUN: 12 mg/dL (ref 6–20)
CALCIUM: 8.9 mg/dL (ref 8.9–10.3)
CO2: 24 mmol/L (ref 22–32)
Chloride: 104 mmol/L (ref 101–111)
Creatinine, Ser: 0.64 mg/dL (ref 0.44–1.00)
GFR calc Af Amer: >60 mL/min (ref >60)
GFR calc non Af Amer: >60 mL/min (ref >60)
GLUCOSE: 116 mg/dL — AB (ref 65–99)
Potassium: 3.3 mmol/L — ABNORMAL LOW (ref 3.5–5.1)
SODIUM: 140 mmol/L (ref 135–145)
Total Bilirubin: 0.5 mg/dL (ref 0.3–1.2)
Total Protein: 7 g/dL (ref 6.5–8.1)

## 2017-02-28 LAB — CBC
HCT: 39.7 % (ref 35.0–47.0)
Hemoglobin: 13.4 g/dL (ref 12.0–16.0)
MCH: 29.1 pg (ref 26.0–34.0)
MCHC: 33.6 g/dL (ref 32.0–36.0)
MCV: 86.5 fL (ref 80.0–100.0)
PLATELETS: 252 10*3/uL (ref 150–440)
RBC: 4.59 MIL/uL (ref 3.80–5.20)
RDW: 13.9 % (ref 11.5–14.5)
WBC: 11 10*3/uL (ref 3.6–11.0)

## 2017-02-28 LAB — HEMOGLOBIN: Hemoglobin: 12.3 g/dL (ref 12.0–16.0)

## 2017-02-28 LAB — TYPE AND SCREEN
ABO/RH(D): O POS
Antibody Screen: NEGATIVE

## 2017-02-28 LAB — MAGNESIUM: Magnesium: 2 mg/dL (ref 1.7–2.4)

## 2017-02-28 MED ORDER — ALBUTEROL SULFATE (2.5 MG/3ML) 0.083% IN NEBU: 2.5000 mg | INHALATION_SOLUTION | RESPIRATORY_TRACT | Status: DC | PRN

## 2017-02-28 MED ORDER — BISACODYL 5 MG PO TBEC: 5.0000 mg | DELAYED_RELEASE_TABLET | Freq: Every day | ORAL | Status: DC | PRN

## 2017-02-28 MED ORDER — PANTOPRAZOLE SODIUM 40 MG IV SOLR
40.0000 mg | Freq: Two times a day (BID) | INTRAVENOUS | Status: DC
  Administered 2017-02-28 – 2017-03-01 (×3): 40 mg via INTRAVENOUS
  Filled 2017-02-28 (×4): qty 40

## 2017-02-28 MED ORDER — HYDROCODONE-ACETAMINOPHEN 5-325 MG PO TABS: 1.0000 | ORAL_TABLET | ORAL | Status: DC | PRN

## 2017-02-28 MED ORDER — ONDANSETRON HCL 4 MG PO TABS
4.0000 mg | ORAL_TABLET | Freq: Four times a day (QID) | ORAL | Status: DC | PRN
  Administered 2017-03-02: 14:00:00 4 mg via ORAL
  Filled 2017-02-28: qty 1

## 2017-02-28 MED ORDER — POTASSIUM CHLORIDE IN NACL 20-0.9 MEQ/L-% IV SOLN
INTRAVENOUS | Status: DC
  Administered 2017-02-28 – 2017-03-02 (×3): via INTRAVENOUS
  Filled 2017-02-28 (×6): qty 1000

## 2017-02-28 MED ORDER — ACETAMINOPHEN 650 MG RE SUPP: 650.0000 mg | Freq: Four times a day (QID) | RECTAL | Status: DC | PRN

## 2017-02-28 MED ORDER — ONDANSETRON HCL 4 MG/2ML IJ SOLN: 4.0000 mg | Freq: Four times a day (QID) | INTRAMUSCULAR | Status: DC | PRN

## 2017-02-28 MED ORDER — LISINOPRIL 10 MG PO TABS
10.0000 mg | ORAL_TABLET | Freq: Every day | ORAL | Status: DC
  Administered 2017-03-01: 10 mg via ORAL
  Filled 2017-02-28 (×2): qty 1

## 2017-02-28 MED ORDER — ACETAMINOPHEN 325 MG PO TABS: 650.0000 mg | ORAL_TABLET | Freq: Four times a day (QID) | ORAL | Status: DC | PRN

## 2017-02-28 MED ORDER — SENNOSIDES-DOCUSATE SODIUM 8.6-50 MG PO TABS
1.0000 | ORAL_TABLET | Freq: Every evening | ORAL | Status: DC | PRN
Start: 2017-02-28 — End: 2017-03-02

## 2017-02-28 NOTE — Telephone Encounter (Signed)
If recurrent/SOB/CP/lightheaded, then to ER.   Refer to GI.   Update me if continued sx.   Thanks.

## 2017-02-28 NOTE — Telephone Encounter (Signed)
Pt reports had "normal, soft BM" this am. This afternoon had watery stool with bright red blood "mixed in stool, toilet water red." No clots.  Denies abdominal pain, nausea,dizziness,  no other symptoms. Seen Friday by Dr. Damita Dunnings for "abnormal stools."  Spoke with Mearl Latin, will advise.  Reason for Disposition . MODERATE rectal bleeding (small blood clots, passing blood without stool, or toilet water turns red)  Answer Assessment - Initial Assessment Questions 1. APPEARANCE of BLOOD: "What color is it?" "Is it passed separately, on the surface of the stool, or mixed in with the stool?"      Bright red, mixed in stool, stool is watery 2. AMOUNT: "How much blood was passed?"      "Hard to tell" 3. FREQUENCY: "How many times has blood been passed with the stools?"      1 time today.  Seen Friday,for rust colored stool. 4. ONSET: "When was the blood first seen in the stools?" (Days or weeks)      Friday 5. DIARRHEA: "Is there also some diarrhea?" If so, ask: "How many diarrhea stools were passed in past 24 hours?"      Yes ; 2 stools, one normal this am, then watery loose with blood 6. CONSTIPATION: "Do you have constipation?" If so, "How bad is it?"     No 7. RECURRENT SYMPTOMS: "Have you had blood in your stools before?" If so, ask: "When was the last time?" and "What happened that time?"      no 8. BLOOD THINNERS: "Do you take any blood thinners?" (e.g., Coumadin/warfarin, Pradaxa/dabigatran, aspirin)     No 9. OTHER SYMPTOMS: "Do you have any other symptoms?"  (e.g., abdominal pain, vomiting, dizziness, fever)     Urgency, pressure 10. PREGNANCY: "Is there any chance you are pregnant?" "When was your last menstrual period?"       no  Protocols used: RECTAL BLEEDING-A-AH

## 2017-02-28 NOTE — ED Notes (Signed)
Pt alert.  Family with pt.  Pt waiting on admission. nsr on monitor.   Skin arm and dry

## 2017-02-28 NOTE — Telephone Encounter (Signed)
Fecal occult blood lab result not back yet; see 02/25/17 lab result note also.

## 2017-02-28 NOTE — ED Notes (Signed)
Report called to marcel rn floor nurse 

## 2017-02-28 NOTE — H&P (Signed)
[place] [facility] [place] NAME: Patricia Hoover    MR#:  [id number]  DATE OF BIRTH:  10/16/1945  DATE OF ADMISSION:  02/28/2017  PRIMARY CARE PHYSICIAN: Tonia Ghent, MD   REQUESTING/REFERRING PHYSICIAN: Dr. Rip Harbour.  CHIEF COMPLAINT:   Chief Complaint  Patient presents with  . Rectal Bleeding   Rectal bleeding today. HISTORY OF PRESENT ILLNESS:  Patricia Hoover  is a 72 y.o. female with a known history of GI bleeding,  Diverticulosis, polyps and multiple medical problems as below.  The patient has had 3 episodes of rectal bleeding today, which is a lot of dark red blood with blood clot.  She denies any abdominal pain, nausea, vomiting or diarrhea.  But he had decent amount bloody stool in the ED.  PAST MEDICAL HISTORY:   Past Medical History:  Diagnosis Date  . Arrhythmia    possible hx, resolved prev  . Blood transfusion without reported diagnosis 1972   had reaction; had to stop  . Carpal tunnel syndrome   . H/O exercise stress test 2012   normal  . Heart murmur    as child  . High cholesterol   . History of kidney stones   . Hx of colonic polyps   . Hypertension   . Osteoporosis 2010   t score - 3.9  . Renal stones     PAST SURGICAL HISTORY:   Past Surgical History:  Procedure Laterality Date  . ABDOMINAL HYSTERECTOMY  1972   for endometriosis  . COLONOSCOPY WITH PROPOFOL N/A 05/04/2016   Procedure: COLONOSCOPY WITH PROPOFOL;  Surgeon: Jonathon Bellows, MD;  Location: [facility] ENDOSCOPY;  Service: Endoscopy;  Laterality: N/A;  . LITHOTRIPSY     for kidney stone x5    SOCIAL HISTORY:   Social History   Tobacco Use  . Smoking status: Former Smoker    Types: Cigarettes    Last attempt to quit: 01/19/1991    Years since quitting: 26.1  . Smokeless tobacco: Never Used  Substance Use Topics  . Alcohol use: No    FAMILY HISTORY:   Family History  Problem Relation Age of Onset  . Heart disease Mother   . Renal Disease Mother    . Heart failure Mother   . Colon cancer Neg Hx   . Breast cancer Neg Hx     DRUG ALLERGIES:   Allergies  Allergen Reactions  . Apple Other (See Comments)    heartburn  . Bisphosphonates     GI side effects GI side effects  . Prolia [Denosumab]     GI upset GI upset    REVIEW OF SYSTEMS:   Review of Systems  Constitutional: Positive for malaise/fatigue. Negative for chills and fever.  HENT: Negative for sore throat.   Eyes: Negative for blurred vision and double vision.  Respiratory: Negative for cough, hemoptysis, shortness of breath, wheezing and stridor.   Cardiovascular: Negative for chest pain, palpitations, orthopnea and leg swelling.  Gastrointestinal: Positive for blood in stool. Negative for abdominal pain, diarrhea, melena, nausea and vomiting.  Genitourinary: Negative for dysuria, flank pain and hematuria.  Musculoskeletal: Negative for back pain and joint pain.  Neurological: Negative for dizziness, sensory change, focal weakness, seizures, loss of consciousness, weakness and headaches.  Endo/Heme/Allergies: Negative for polydipsia.  Psychiatric/Behavioral: Negative for depression. The patient is not nervous/anxious.     MEDICATIONS AT HOME:   Prior to Admission medications   Medication Sig Start Date End Date Taking? Authorizing  Provider  aspirin 81 MG tablet Take 81 mg by mouth daily.   Yes [provider]  cholecalciferol (VITAMIN D) 1000 units tablet Take 2 tablets (2,000 Units total) by mouth daily. 12/16/15  Yes Tonia Ghent, MD  lisinopril-hydrochlorothiazide (PRINZIDE,ZESTORETIC) 10-12.5 MG tablet Take 1 tablet by mouth daily. 12/20/16  Yes Tonia Ghent, MD  simvastatin (ZOCOR) 10 MG tablet Take 1 tablet (10 mg total) by mouth daily. 12/20/16  Yes Tonia Ghent, MD  ibuprofen (ADVIL) 200 MG tablet Take 1-1.5 tablets (200-300 mg total) by mouth every 8 (eight) hours as needed (for pain, take with food.). 12/20/16   Tonia Ghent, MD        VITAL SIGNS:  Blood pressure (!) 149/70, pulse 75, temperature 98.8 F (37.1 C), temperature source Oral, resp. rate 19, weight 168 lb (76.2 kg), SpO2 99 %.  PHYSICAL EXAMINATION:  Physical Exam  GENERAL:  72 y.o.-year-old patient lying in the bed with no acute distress.  EYES: Pupils equal, round, reactive to light and accommodation. No scleral icterus. Extraocular muscles intact.  HEENT: Head atraumatic, normocephalic. Oropharynx and nasopharynx clear.  NECK:  Supple, no jugular venous distention. No thyroid enlargement, no tenderness.  LUNGS: Normal breath sounds bilaterally, no wheezing, rales,rhonchi or crepitation. No use of accessory muscles of respiration.  CARDIOVASCULAR: S1, S2 normal. No murmurs, rubs, or gallops.  ABDOMEN: Soft, nontender, nondistended. Bowel sounds present. No organomegaly or mass.  EXTREMITIES: No pedal edema, cyanosis, or clubbing.  NEUROLOGIC: Cranial nerves II through XII are intact. Muscle strength 5/5 in all extremities. Sensation intact. Gait not checked.  PSYCHIATRIC: The patient is alert and oriented x 3.  SKIN: No obvious rash, lesion, or ulcer.   LABORATORY PANEL:   CBC Recent Labs  Lab 02/28/17 1539  WBC 11.0  HGB 13.4  HCT 39.7  PLT 252   ------------------------------------------------------------------------------------------------------------------  Chemistries  Recent Labs  Lab 02/28/17 1539  NA 140  K 3.3*  CL 104  CO2 24  GLUCOSE 116*  BUN 12  CREATININE 0.64  CALCIUM 8.9  AST 17  ALT 12*  ALKPHOS 83  BILITOT 0.5   ------------------------------------------------------------------------------------------------------------------  Cardiac Enzymes No results for input(s): TROPONINI in the last 168 hours. ------------------------------------------------------------------------------------------------------------------  RADIOLOGY:  No results found.    IMPRESSION AND PLAN:   Acute GI bleeding with  history of diverticulosis. The patient will be admitted to medical floor. Keep n.p.o. with IV fluid support. Hold aspirin and ibuprofen, Protonix IV and GI consult. Follow-up hemoglobin every 6 hours.  Hypokalemia.  Give IV fluid with potassium supplement.  Follow-up potassium and magnesium level.  Hypertension.  Continue lisinopril and hold HCTZ. Hyperlipidemia.  Hold Zocor at this time.  All the records are reviewed and case discussed with ED provider. Management plans discussed with the patient, family and they are in agreement.  CODE STATUS: Full code  TOTAL TIME TAKING CARE OF THIS PATIENT: 55 minutes.    Demetrios Loll M.D on 02/28/2017 at 6:23 PM  Between 7am to 6pm - Pager - [phone]  After 6pm go to [email or web address] - password [email or web address] [facility]  [employer] [facility]  Office  [phone]  CC: Primary care physician; Tonia Ghent, MD   Note: This dictation was prepared with Dragon dictation along with smaller phrase technology. Any transcriptional errors that result from this process are unin

## 2017-02-28 NOTE — ED Provider Notes (Signed)
[facility] Emergency Department Provider Note   ____________________________________________   First MD Initiated Contact with Patient 02/28/17 1649     (approximate)  I have reviewed the triage vital signs and the nursing notes.   HISTORY  Chief Complaint Rectal Bleeding    HPI Patricia Hoover is a 72 y.o. female Who reports she has a history of diverticulitis and polyps and has had GI bleeding in the past. She says she's had 3 episodes of GI bleeding today.she is not lightheaded or dizzy. She has no pain in her belly. She did have a fairly decent sized rectal bleeding here in the ER   Past Medical History:  Diagnosis Date  . Arrhythmia    possible hx, resolved prev  . Blood transfusion without reported diagnosis 1972   had reaction; had to stop  . Carpal tunnel syndrome   . H/O exercise stress test 2012   normal  . Heart murmur    as child  . High cholesterol   . History of kidney stones   . Hx of colonic polyps   . Hypertension   . Osteoporosis 2010   t score - 3.9  . Renal stones     Patient Active Problem List   Diagnosis Date Noted  . GIB (gastrointestinal bleeding) 02/28/2017  . Abnormal stools 02/27/2017  . Advance care planning 12/23/2016  . Kidney stone 04/20/2016  . Heartburn 12/17/2015  . Medicare annual wellness visit, subsequent 06/05/2013  . Hyperglycemia 06/05/2013  . Knee pain 08/31/2012  . Osteoporosis 08/18/2012  . HTN (hypertension) 08/09/2012  . HLD (hyperlipidemia) 08/09/2012    Past Surgical History:  Procedure Laterality Date  . ABDOMINAL HYSTERECTOMY  1972   for endometriosis  . COLONOSCOPY WITH PROPOFOL N/A 05/04/2016   Procedure: COLONOSCOPY WITH PROPOFOL;  Surgeon: Jonathon Bellows, MD;  Location: [facility] ENDOSCOPY;  Service: Endoscopy;  Laterality: N/A;  . LITHOTRIPSY     for kidney stone x5    Prior to Admission medications   Medication Sig Start Date End Date Taking? Authorizing Provider  aspirin 81 MG  tablet Take 81 mg by mouth daily.   Yes [provider]  cholecalciferol (VITAMIN D) 1000 units tablet Take 2 tablets (2,000 Units total) by mouth daily. 12/16/15  Yes Tonia Ghent, MD  lisinopril-hydrochlorothiazide (PRINZIDE,ZESTORETIC) 10-12.5 MG tablet Take 1 tablet by mouth daily. 12/20/16  Yes Tonia Ghent, MD  simvastatin (ZOCOR) 10 MG tablet Take 1 tablet (10 mg total) by mouth daily. 12/20/16  Yes Tonia Ghent, MD  ibuprofen (ADVIL) 200 MG tablet Take 1-1.5 tablets (200-300 mg total) by mouth every 8 (eight) hours as needed (for pain, take with food.). 12/20/16   Tonia Ghent, MD    Allergies Apple; Bisphosphonates; and Prolia [denosumab]  Family History  Problem Relation Age of Onset  . Heart disease Mother   . Renal Disease Mother   . Heart failure Mother   . Colon cancer Neg Hx   . Breast cancer Neg Hx     Social History Social History   Tobacco Use  . Smoking status: Former Smoker    Types: Cigarettes    Last attempt to quit: 01/19/1991    Years since quitting: 26.1  . Smokeless tobacco: Never Used  Substance Use Topics  . Alcohol use: No  . Drug use: No    Review of Systems  Constitutional: No fever/chills Eyes: No visual changes. ENT: No sore throat. Cardiovascular: Denies chest pain. Respiratory: Denies shortness of  breath. Gastrointestinal: No abdominal pain.  No nausea, no vomiting.  No diarrhea.  No constipation. Genitourinary: Negative for dysuria. Musculoskeletal: Negative for back pain. Skin: Negative for rash. Neurological: Negative for headaches, focal weakness   ____________________________________________   PHYSICAL EXAM:  VITAL SIGNS: ED Triage Vitals  Enc Vitals Group     BP 02/28/17 1502 (!) 152/83     Pulse Rate 02/28/17 1502 87     Resp 02/28/17 1502 18     Temp 02/28/17 1502 98.8 F (37.1 C)     Temp Source 02/28/17 1502 Oral     SpO2 02/28/17 1502 99 %     Weight 02/28/17 1503 168 lb (76.2 kg)      Height --      Head Circumference --      Peak Flow --      Pain Score 02/28/17 1503 0     Pain Loc --      Pain Edu? --      Excl. in [place]? --    Constitutional: Alert and oriented. Well appearing and in no acute distress. Eyes: Conjunctivae are normal. PERRL. EOMI. Head: Atraumatic. Nose: No congestion/rhinnorhea. Mouth/Throat: Mucous membranes are moist.  Oropharynx non-erythematous. Neck: No stridor.  Cardiovascular: Normal rate, regular rhythm. Grossly normal heart sounds.  Good peripheral circulation. Respiratory: Normal respiratory effort.  No retractions. Lungs CTAB. Gastrointestinal: Soft and nontender. No distention. No abdominal bruits. No CVA tenderness. rectal: Large skin tags from old hemorrhoids not bleeding. There is blood inside the rectum. And again she had1 dark red bloody stool here. Musculoskeletal: No lower extremity tenderness nor edema.   Neurologic:  Normal speech and language. No gross focal neurologic deficits are appreciated. No gait instability. Skin:  Skin is warm, dry and intact. No rash noted. Psychiatric: Mood and affect are normal. Speech and behavior are normal.  ____________________________________________   LABS (all labs ordered are listed, but only abnormal results are displayed)  Labs Reviewed  COMPREHENSIVE METABOLIC PANEL - Abnormal; Notable for the following components:      Result Value   Potassium 3.3 (*)    Glucose, Bld 116 (*)    ALT 12 (*)    All other components within normal limits  CBC  MAGNESIUM  HEMOGLOBIN  BASIC METABOLIC PANEL  HEMOGLOBIN  HEMOGLOBIN  POC OCCULT BLOOD, ED  TYPE AND SCREEN   ____________________________________________  EKG   ____________________________________________  RADIOLOGY  ED MD interpretation:   Official radiology report(s): No results found.  ____________________________________________   PROCEDURES  Procedure(s) performed:   Procedures  Critical Care performed:    ____________________________________________   INITIAL IMPRESSION / ASSESSMENT AND PLAN / ED COURSE  patient having now for bloody stools in the ER. We will put her in the hospital GI has been contacted they want to.         ____________________________________________   FINAL CLINICAL IMPRESSION(S) / ED DIAGNOSES  Final diagnoses:  Rectal bleeding     ED Discharge Orders    None       Note:  This document was prepared using Dragon voice recognition software and may include unintentional dictation errors.    Nena Polio, MD 02/28/17 [phone]

## 2017-02-28 NOTE — ED Notes (Signed)
Pt has bright red rectal bleeding since 1300 today.  No abd pain.  Pt states she noticed reddish brown stools 1 week ago.  No dizziness.  No vomiting.  Pt alert.  Skin warm and dry.  nsr on monitor.  Family with pt.

## 2017-02-28 NOTE — Telephone Encounter (Signed)
Patricia Hoover with [facility] said if pt has to go to ED which one should she go to; advised either [facility] or [facility] ED; whichever is more convenient for pt.Patricia Hoover will let pt know.

## 2017-02-28 NOTE — ED Notes (Signed)
Iv team in with pt for iv start.

## 2017-02-28 NOTE — ED Notes (Signed)
2 unsuccessful IV attempts.

## 2017-02-28 NOTE — ED Triage Notes (Signed)
Pt reports that she began noticing bright red blood in her stool Friday.pt states that she has had 2 episodes of this today.

## 2017-03-01 DIAGNOSIS — K5731 Diverticulosis of large intestine without perforation or abscess with bleeding: Secondary | ICD-10-CM | POA: Diagnosis not present

## 2017-03-01 DIAGNOSIS — K219 Gastro-esophageal reflux disease without esophagitis: Secondary | ICD-10-CM | POA: Diagnosis not present

## 2017-03-01 DIAGNOSIS — E7800 Pure hypercholesterolemia, unspecified: Secondary | ICD-10-CM | POA: Diagnosis not present

## 2017-03-01 DIAGNOSIS — I10 Essential (primary) hypertension: Secondary | ICD-10-CM | POA: Diagnosis not present

## 2017-03-01 DIAGNOSIS — K644 Residual hemorrhoidal skin tags: Secondary | ICD-10-CM | POA: Diagnosis not present

## 2017-03-01 DIAGNOSIS — E876 Hypokalemia: Secondary | ICD-10-CM | POA: Diagnosis not present

## 2017-03-01 DIAGNOSIS — Z8719 Personal history of other diseases of the digestive system: Secondary | ICD-10-CM | POA: Diagnosis not present

## 2017-03-01 DIAGNOSIS — K922 Gastrointestinal hemorrhage, unspecified: Secondary | ICD-10-CM | POA: Diagnosis not present

## 2017-03-01 LAB — BASIC METABOLIC PANEL
Anion gap: 9 (ref 5–15)
BUN: 11 mg/dL (ref 6–20)
CALCIUM: 8.3 mg/dL — AB (ref 8.9–10.3)
CO2: 25 mmol/L (ref 22–32)
CREATININE: 0.56 mg/dL (ref 0.44–1.00)
Chloride: 107 mmol/L (ref 101–111)
GLUCOSE: 120 mg/dL — AB (ref 65–99)
Potassium: 3.4 mmol/L — ABNORMAL LOW (ref 3.5–5.1)
Sodium: 141 mmol/L (ref 135–145)

## 2017-03-01 LAB — HEMOGLOBIN
Hemoglobin: 12 g/dL (ref 12.0–16.0)
Hemoglobin: 11.8 g/dL — ABNORMAL LOW (ref 12.0–16.0)

## 2017-03-01 MED ORDER — PEG 3350-KCL-NA BICARB-NACL 420 G PO SOLR
4000.0000 mL | Freq: Once | ORAL | Status: AC
  Administered 2017-03-01: 4000 mL via ORAL
  Filled 2017-03-01: qty 4000

## 2017-03-01 NOTE — Plan of Care (Signed)
  Progressing Spiritual Needs Ability to function at adequate level 03/01/2017 1125 - Progressing by Rowe Robert, RN Education: Knowledge of General Education information will improve 03/01/2017 1125 - Progressing by Rowe Robert, [place] Behavior/Discharge Planning: Ability to manage health-related needs will improve 03/01/2017 1125 - Progressing by Rowe Robert, RN Clinical Measurements: Ability to maintain clinical measurements within normal limits will improve 03/01/2017 1125 - Progressing by Rowe Robert, RN Will remain free from infection 03/01/2017 1125 - Progressing by Rowe Robert, RN Diagnostic test results will improve 03/01/2017 1125 - Progressing by Rowe Robert, RN Respiratory complications will improve 03/01/2017 1125 - Progressing by Rowe Robert, RN Cardiovascular complication will be avoided 03/01/2017 1125 - Progressing by Rowe Robert, RN Safety: Ability to remain free from injury will improve 03/01/2017 1125 - Progressing by Rowe Robert, RN

## 2017-03-01 NOTE — Progress Notes (Signed)
[place] [facility] [place] NAME: Patricia Hoover    MR#:  [id number]  DATE OF BIRTH:  1945-02-06  SUBJECTIVE:   Patient here due to multiple episodes of rectal bleeding. Hemoglobin is stable. No acute bleeding this morning or overnight. Denies any abdominal pain or any other associated symptoms.  REVIEW OF SYSTEMS:    Review of Systems  Constitutional: Negative for chills and fever.  HENT: Negative for congestion and tinnitus.   Eyes: Negative for blurred vision and double vision.  Respiratory: Negative for cough, shortness of breath and wheezing.   Cardiovascular: Negative for chest pain, orthopnea and PND.  Gastrointestinal: Positive for blood in stool. Negative for abdominal pain, diarrhea, nausea and vomiting.  Genitourinary: Negative for dysuria and hematuria.  Neurological: Negative for dizziness, sensory change and focal weakness.  All other systems reviewed and are negative.   Nutrition: Clear Liquids Tolerating Diet: Yes Tolerating PT: Ambulatory   DRUG ALLERGIES:   Allergies  Allergen Reactions  . Apple Other (See Comments)    heartburn  . Bisphosphonates     GI side effects GI side effects  . Prolia [Denosumab]     GI upset GI upset    VITALS:  Blood pressure (!) 157/73, pulse 64, temperature 98.2 F (36.8 C), temperature source Oral, resp. rate 20, height 4\' 10"  (1.473 m), weight 75.5 kg (166 lb 6.4 oz), SpO2 100 %.  PHYSICAL EXAMINATION:   Physical Exam  GENERAL:  72 y.o.-year-old patient lying in bed in no acute distress.  EYES: Pupils equal, round, reactive to light and accommodation. No scleral icterus. Extraocular muscles intact.  HEENT: Head atraumatic, normocephalic. Oropharynx and nasopharynx clear.  NECK:  Supple, no jugular venous distention. No thyroid enlargement, no tenderness.  LUNGS: Normal breath sounds bilaterally, no wheezing, rales, rhonchi. No use of accessory muscles of respiration.   CARDIOVASCULAR: S1, S2 normal. No murmurs, rubs, or gallops.  ABDOMEN: Soft, nontender, nondistended. Bowel sounds present. No organomegaly or mass.  EXTREMITIES: No cyanosis, clubbing or edema b/l.    NEUROLOGIC: Cranial nerves II through XII are intact. No focal Motor or sensory deficits b/l.   PSYCHIATRIC: The patient is alert and oriented x 3.  SKIN: No obvious rash, lesion, or ulcer.    LABORATORY PANEL:   CBC Recent Labs  Lab 02/28/17 1539  03/01/17 0849  WBC 11.0  --   --   HGB 13.4   < > 11.8*  HCT 39.7  --   --   PLT 252  --   --    < > = values in this interval not displayed.   ------------------------------------------------------------------------------------------------------------------  Chemistries  Recent Labs  Lab 02/28/17 1539 02/28/17 2042 03/01/17 0156  NA 140  --  141  K 3.3*  --  3.4*  CL 104  --  107  CO2 24  --  25  GLUCOSE 116*  --  120*  BUN 12  --  11  CREATININE 0.64  --  0.56  CALCIUM 8.9  --  8.3*  MG  --  2.0  --   AST 17  --   --   ALT 12*  --   --   ALKPHOS 83  --   --   BILITOT 0.5  --   --    ------------------------------------------------------------------------------------------------------------------  Cardiac Enzymes No results for input(s): TROPONINI in the last 168 hours. ------------------------------------------------------------------------------------------------------------------  RADIOLOGY:  No results found.   ASSESSMENT AND PLAN:   72 year old female  with past medical history of hypertension, hyperlipidemia, history of nephrolithiasis, colonic polyps, osteoporosis who presented to the hospital due to multiple episodes of rectal bleeding.  1. GI bleed-this is a lower GI bleed given the patient's rectal bleeding. Patient does have a previous history of diverticulosis and colonic polyps. -I suspect this is likely a diverticular bleed. Hemoglobin currently stable. -Discussed with gastroenterology will start on  clear liquid diet. Possible colonoscopy tomorrow.  2. HTN - cont. Lisinopril.    3. GERD - cont. Protonix.   4. Hypokalemia - cont. Potassium supplementation and will monitor.   Discussed plan of care with Dr. Bonna Gains.   All the records are reviewed and case discussed with Care Management/Social Worker. Management plans discussed with the patient, family and they are in agreement.  CODE STATUS: Full code  DVT Prophylaxis: Ted's & SCD's   TOTAL TIME TAKING CARE OF THIS PATIENT: 30 minutes.   POSSIBLE D/C IN 1-2 DAYS, DEPENDING ON CLINICAL CONDITION.   Henreitta Leber M.D on 03/01/2017 at 12:35 PM  Between 7am to 6pm - Pager - [phone]  After 6pm go to [email or web address] - Proofreader  [facility]  Office  [phone]  CC: Primary care physician; Tonia Ghent, MD

## 2017-03-01 NOTE — Progress Notes (Signed)
Chaplain responded to a OR for AD. Chaplain educated husband and Pt about POA. Prayed with Pt and family. Will be available when they are ready to sign.   03/01/17 1000  Clinical Encounter Type  Visited With Patient and family together  Visit Type Initial  Referral From Nurse  Spiritual Encounters  Spiritual Needs Brochure;Prayer

## 2017-03-01 NOTE — Consult Note (Signed)
Patricia Antigua, MD [address], [place], [place], [place], [address] [address], [place], [place], [place], [address] Phone: [phone]  Fax: [phone]  Consultation  Referring Provider:     Dr. Verdell Carmine Primary Care Physician:  Tonia Ghent, MD Primary Gastroenterologist:  Virgel Manifold, MD        Reason for Consultation:     Hematochezia  Date of Admission:  02/28/2017 Date of Consultation:  03/01/2017         HPI:   Patricia Hoover is a 72 y.o. female presents with hematochezia at home.  Had 3 episodes of dark red blood with blood clot at home that started 1-2 days ago, not associated with any nausea vomiting.  Not on any anticoagulants.  Patient's last colonoscopy was in April 2018, and it was done to follow-up for rectal bleeding just prior to the procedure.  The colonoscopy was done by Dr. Vicente Males, and showed 2 small polyps that were removed, nonbleeding internal hemorrhoids, and diverticulosis.  Pathology report showed tubular adenoma and hyperplastic polyp.  She had a colonoscopy in August 2016 by Dr. Elmo Putt, diverticulosis was seen, and one small polyp was removed at that time.  Polyp was tubular adenoma.  Patient denies any abdominal pain, no nausea vomiting, no weight loss, no dysphagia, no heartburn.  No previous EGDs.  No family history of colon cancer.  Lowest hemoglobin since admission has been 11.8, with baseline around 13.  Past Medical History:  Diagnosis Date  . Arrhythmia    possible hx, resolved prev  . Blood transfusion without reported diagnosis 1972   had reaction; had to stop  . Carpal tunnel syndrome   . H/O exercise stress test 2012   normal  . Heart murmur    as child  . High cholesterol   . History of kidney stones   . Hx of colonic polyps   . Hypertension   . Osteoporosis 2010   t score - 3.9  . Renal stones     Past Surgical History:  Procedure Laterality Date  . ABDOMINAL HYSTERECTOMY  1972   for endometriosis  .  COLONOSCOPY WITH PROPOFOL N/A 05/04/2016   Procedure: COLONOSCOPY WITH PROPOFOL;  Surgeon: Jonathon Bellows, MD;  Location: [facility] ENDOSCOPY;  Service: Endoscopy;  Laterality: N/A;  . LITHOTRIPSY     for kidney stone x5    Prior to Admission medications   Medication Sig Start Date End Date Taking? Authorizing Provider  aspirin 81 MG tablet Take 81 mg by mouth daily.   Yes [provider]  cholecalciferol (VITAMIN D) 1000 units tablet Take 2 tablets (2,000 Units total) by mouth daily. 12/16/15  Yes Tonia Ghent, MD  lisinopril-hydrochlorothiazide (PRINZIDE,ZESTORETIC) 10-12.5 MG tablet Take 1 tablet by mouth daily. 12/20/16  Yes Tonia Ghent, MD  simvastatin (ZOCOR) 10 MG tablet Take 1 tablet (10 mg total) by mouth daily. 12/20/16  Yes Tonia Ghent, MD  ibuprofen (ADVIL) 200 MG tablet Take 1-1.5 tablets (200-300 mg total) by mouth every 8 (eight) hours as needed (for pain, take with food.). 12/20/16   Tonia Ghent, MD    Family History  Problem Relation Age of Onset  . Heart disease Mother   . Renal Disease Mother   . Heart failure Mother   . Colon cancer Neg Hx   . Breast cancer Neg Hx      Social History   Tobacco Use  . Smoking status: Former Smoker    Types: Cigarettes  Last attempt to quit: 01/19/1991    Years since quitting: 26.1  . Smokeless tobacco: Never Used  Substance Use Topics  . Alcohol use: No  . Drug use: No    Allergies as of 02/28/2017 - Review Complete 02/28/2017  Allergen Reaction Noted  . Apple Other (See Comments) 10/25/2014  . Bisphosphonates  08/18/2012  . Prolia [denosumab]  06/21/2013    Review of Systems:    All systems reviewed and negative except where noted in HPI.   Physical Exam:  Vital signs in last 24 hours: Vitals:   02/28/17 2125 02/28/17 2125 03/01/17 0439 03/01/17 1008  BP:  (!) 150/76 139/68 (!) 157/73  Pulse:  68 (!) 58 64  Resp:  18 16 20   Temp:  97.6 F (36.4 C) 98.2 F (36.8 C) 98.2 F (36.8 C)  TempSrc:   Oral Oral Oral  SpO2: 100%  98% 100%  Weight:      Height:       Last BM Date: 03/01/17 General:   Pleasant, cooperative in NAD Head:  Normocephalic and atraumatic. Eyes:   No icterus.   Conjunctiva pink. PERRLA. Ears:  Normal auditory acuity. Neck:  Supple; no masses or thyroidomegaly Lungs: Respirations even and unlabored. Lungs clear to auscultation bilaterally.   No wheezes, crackles, or rhonchi.  Heart:  Regular rate and rhythm;  Without murmur, clicks, rubs or gallops Abdomen:  Soft, nondistended, nontender. Normal bowel sounds. No appreciable masses or hepatomegaly.  No rebound or guarding.  Neurologic:  Alert and oriented x3;  grossly normal neurologically. Skin:  Intact without significant lesions or rashes. Cervical Nodes:  No significant cervical adenopathy. Psych:  Alert and cooperative. Normal affect.  LAB RESULTS: Recent Labs    02/28/17 1539 02/28/17 2042 03/01/17 0156 03/01/17 0849  WBC 11.0  --   --   --   HGB 13.4 12.3 12.0 11.8*  HCT 39.7  --   --   --   PLT 252  --   --   --    BMET Recent Labs    02/28/17 1539 03/01/17 0156  NA 140 141  K 3.3* 3.4*  CL 104 107  CO2 24 25  GLUCOSE 116* 120*  BUN 12 11  CREATININE 0.64 0.56  CALCIUM 8.9 8.3*   LFT Recent Labs    02/28/17 1539  PROT 7.0  ALBUMIN 3.6  AST 17  ALT 12*  ALKPHOS 83  BILITOT 0.5   PT/INR No results for input(s): LABPROT, INR in the last 72 hours.  STUDIES: No results found.    Impression / Plan:   Patricia Hoover is a 72 y.o. y/o female with hematochezia at home, with no significant acute anemia requiring blood transfusion, and history of colonoscopy in April 2018 for hematochezia revealing nonbleeding diverticulosis and internal hemorrhoids  Patient reports this episode of hematochezia is much different from her previous episode in April 2018, and that the blood is much more significant on this episode Etiology could be due to diverticulosis versus hemorrhoids Less likely  to be colonic malignancy given recent colonoscopies not showing any malignancies. No known episodes of hypotension, unlikely to be ischemic colitis  Patient reports occasional NSAID use Upper GI bleed is less likely, although possible  Options of colonoscopy with or without EGD were discussed with the patient.  Patient has never had an EGD before, and given her NSAID use, an EGD would help rule out any esophagitis, gastritis, gastric ulcers. Patient is willing to undergo the prep in  both procedures We will plan for procedure tomorrow If patient unable to complete prep today, can do procedure day after tomorrow. Clear liquid diet with prep N.p.o. after 7 AM tomorrow for procedure tomorrow Continue serial CBCs and transfuse PRN Continue PPI twice daily until procedure Follow procedure notes for further recommendations  I have discussed alternative options, risks & benefits,  which include, but are not limited to, bleeding, infection, perforation,respiratory complication & drug reaction.  The patient agrees with this plan & written consent will be obtained.    Thank you for involving me in the care of this patient.      LOS: 1 day   Virgel Manifold, MD  03/01/2017, 4:17 PM

## 2017-03-02 ENCOUNTER — Encounter: Payer: Self-pay | Admitting: Certified Registered Nurse Anesthetist

## 2017-03-02 ENCOUNTER — Encounter
Admission: EM | Disposition: A | Discharge status: Home / Self Care | Payer: Self-pay | Source: Home / Self Care | Attending: Specialist

## 2017-03-02 ENCOUNTER — Inpatient Hospital Stay: Payer: Medicare Other | Admitting: Anesthesiology

## 2017-03-02 DIAGNOSIS — K269 Duodenal ulcer, unspecified as acute or chronic, without hemorrhage or perforation: Secondary | ICD-10-CM | POA: Diagnosis not present

## 2017-03-02 DIAGNOSIS — K449 Diaphragmatic hernia without obstruction or gangrene: Secondary | ICD-10-CM | POA: Diagnosis not present

## 2017-03-02 DIAGNOSIS — B9681 Helicobacter pylori [H. pylori] as the cause of diseases classified elsewhere: Secondary | ICD-10-CM | POA: Diagnosis not present

## 2017-03-02 DIAGNOSIS — I10 Essential (primary) hypertension: Secondary | ICD-10-CM | POA: Diagnosis not present

## 2017-03-02 DIAGNOSIS — E876 Hypokalemia: Secondary | ICD-10-CM | POA: Diagnosis not present

## 2017-03-02 DIAGNOSIS — K5730 Diverticulosis of large intestine without perforation or abscess without bleeding: Secondary | ICD-10-CM | POA: Diagnosis not present

## 2017-03-02 DIAGNOSIS — K5731 Diverticulosis of large intestine without perforation or abscess with bleeding: Secondary | ICD-10-CM | POA: Diagnosis not present

## 2017-03-02 DIAGNOSIS — E7800 Pure hypercholesterolemia, unspecified: Secondary | ICD-10-CM | POA: Diagnosis not present

## 2017-03-02 DIAGNOSIS — K644 Residual hemorrhoidal skin tags: Secondary | ICD-10-CM

## 2017-03-02 DIAGNOSIS — K219 Gastro-esophageal reflux disease without esophagitis: Secondary | ICD-10-CM | POA: Diagnosis not present

## 2017-03-02 DIAGNOSIS — K921 Melena: Secondary | ICD-10-CM

## 2017-03-02 DIAGNOSIS — K648 Other hemorrhoids: Secondary | ICD-10-CM

## 2017-03-02 DIAGNOSIS — Z8719 Personal history of other diseases of the digestive system: Secondary | ICD-10-CM | POA: Diagnosis not present

## 2017-03-02 DIAGNOSIS — K3189 Other diseases of stomach and duodenum: Secondary | ICD-10-CM | POA: Diagnosis not present

## 2017-03-02 DIAGNOSIS — D62 Acute posthemorrhagic anemia: Secondary | ICD-10-CM

## 2017-03-02 DIAGNOSIS — K2930 Chronic superficial gastritis without bleeding: Secondary | ICD-10-CM | POA: Diagnosis not present

## 2017-03-02 DIAGNOSIS — K922 Gastrointestinal hemorrhage, unspecified: Secondary | ICD-10-CM | POA: Diagnosis not present

## 2017-03-02 HISTORY — PX: COLONOSCOPY: SHX5424

## 2017-03-02 HISTORY — PX: ESOPHAGOGASTRODUODENOSCOPY: SHX5428

## 2017-03-02 LAB — HEMOGLOBIN: Hemoglobin: 12.5 g/dL (ref 12.0–16.0)

## 2017-03-02 SURGERY — COLONOSCOPY
Anesthesia: General | Laterality: Left

## 2017-03-02 MED ORDER — PROPOFOL 500 MG/50ML IV EMUL
INTRAVENOUS | Status: AC
  Filled 2017-03-02: qty 50

## 2017-03-02 MED ORDER — LIDOCAINE HCL (CARDIAC) 20 MG/ML IV SOLN
INTRAVENOUS | Status: DC | PRN
  Administered 2017-03-02: 50 mg via INTRATRACHEAL

## 2017-03-02 MED ORDER — HYDROCORTISONE ACETATE 25 MG RE SUPP
25.0000 mg | Freq: Two times a day (BID) | RECTAL | Status: DC
  Filled 2017-03-02 (×2): qty 1

## 2017-03-02 MED ORDER — LIDOCAINE HCL (PF) 2 % IJ SOLN
INTRAMUSCULAR | Status: AC
  Filled 2017-03-02: qty 10

## 2017-03-02 MED ORDER — PROPOFOL 500 MG/50ML IV EMUL
INTRAVENOUS | Status: DC | PRN
  Administered 2017-03-02: 130 ug/kg/min via INTRAVENOUS

## 2017-03-02 MED ORDER — PROPOFOL 10 MG/ML IV BOLUS
INTRAVENOUS | Status: DC | PRN
  Administered 2017-03-02 (×2): 30 mg via INTRAVENOUS
  Administered 2017-03-02: 90 mg via INTRAVENOUS

## 2017-03-02 MED ORDER — SODIUM CHLORIDE 0.9 % IV SOLN
INTRAVENOUS | Status: DC
  Administered 2017-03-02: 09:00:00 via INTRAVENOUS

## 2017-03-02 MED ORDER — SODIUM CHLORIDE 0.9 % IV SOLN: INTRAVENOUS | Status: DC

## 2017-03-02 MED ORDER — PANTOPRAZOLE SODIUM 40 MG PO TBEC: 40.0000 mg | DELAYED_RELEASE_TABLET | Freq: Two times a day (BID) | ORAL | Status: DC

## 2017-03-02 MED ORDER — HYDROCORTISONE ACETATE 25 MG RE SUPP: 25.0000 mg | Freq: Two times a day (BID) | RECTAL | 0 refills | Status: DC

## 2017-03-02 MED ORDER — PROPOFOL 10 MG/ML IV BOLUS
INTRAVENOUS | Status: AC
  Filled 2017-03-02: qty 20

## 2017-03-02 MED ORDER — PANTOPRAZOLE SODIUM 40 MG PO TBEC: 40.0000 mg | DELAYED_RELEASE_TABLET | Freq: Two times a day (BID) | ORAL | 0 refills | Status: DC

## 2017-03-02 NOTE — Care Management Important Message (Signed)
Important Message  Patient Details  Name: Patricia Hoover MRN: [id number] Date of Birth: July 11, 1945   Medicare Important Message Given:  Yes    Shelbie Ammons, RN 03/02/2017, 6:43 AM

## 2017-03-02 NOTE — Anesthesia Postprocedure Evaluation (Signed)
Anesthesia Post Note  Patient: Patricia Hoover  Procedure(s) Performed: COLONOSCOPY (Left ) ESOPHAGOGASTRODUODENOSCOPY (EGD) (Left )  Patient location during evaluation: Endoscopy Anesthesia Type: General Level of consciousness: awake and alert and oriented Pain management: pain level controlled Vital Signs Assessment: post-procedure vital signs reviewed and stable Respiratory status: spontaneous breathing, nonlabored ventilation and respiratory function stable Cardiovascular status: blood pressure returned to baseline and stable Postop Assessment: no signs of nausea or vomiting Anesthetic complications: no     Last Vitals:  Vitals:   03/02/17 0946 03/02/17 0956  BP: 112/62 (!) 131/56  Pulse:    Resp:    Temp: 36.9 C   SpO2:      Last Pain:  Vitals:   03/02/17 0956  TempSrc:   PainSc: 0-No pain                 Natallie Ravenscroft

## 2017-03-02 NOTE — Op Note (Addendum)
[facility] Gastroenterology Patient Name: Patricia Hoover Procedure Date: 03/02/2017 8:06 AM MRN: [id number] Account #: [id number] Date of Birth: [birth year] Admit Type: Inpatient Age: 72 Room: [facility] ENDO ROOM 1 Gender: Female Note Status: Finalized Procedure:            Colonoscopy Indications:          Hematochezia Providers:            Varnita B. Bonna Gains MD, MD Referring MD:         Elveria Rising. Damita Dunnings, MD (Referring MD) Medicines:            Monitored Anesthesia Care Complications:        No immediate complications. Procedure:            Pre-Anesthesia Assessment:                       - ASA Grade Assessment: II - A patient with mild                        systemic disease.                       - Prior to the procedure, a History and Physical was                        performed, and patient medications, allergies and                        sensitivities were reviewed. The patient's tolerance of                        previous anesthesia was reviewed.                       After obtaining informed consent, the colonoscope was                        passed under direct vision. Throughout the procedure,                        the patient's blood pressure, pulse, and oxygen                        saturations were monitored continuously. The                        Colonoscope was introduced through the anus and                        advanced to the the terminal ileum. The colonoscopy was                        performed with ease. The patient tolerated the                        procedure well. The quality of the bowel preparation                        was fair. Findings:      The perianal exam findings include non-thrombosed external hemorrhoids.      Multiple small and large-mouthed diverticula were  found in the sigmoid       colon, descending colon and ascending colon. Most were in the sigmoid       colon.      There is no endoscopic evidence of bleeding,  inflammation or mass in the       entire colon.      The rectum, sigmoid colon, descending colon, transverse colon, ascending       colon and cecum appeared normal otherwise.      Non-bleeding internal hemorrhoids were found during retroflexion. The       hemorrhoids were medium-sized. Impression:           - Preparation of the colon was fair.                       - Non-thrombosed external hemorrhoids found on perianal                        exam. One of these was large but non bleeding.                       - Diverticulosis in the sigmoid colon, in the                        descending colon and in the ascending colon.                       - The rectum, sigmoid colon, descending colon,                        transverse colon, ascending colon and cecum are normal.                       - Non-bleeding internal hemorrhoids.                       - No specimens collected.                       - The hemorrhoids were the likely source of her                        hematochezia as evidenced by her clinical history and                        labs showing a non-significant drop in her Hgb from                        baseline. Recommendation:       - Use hydrocortisone suppository 25 mg 1 per rectum                        once a day for 10 days.                       - Consult surgery for hemorrhoidectomy of the large                        external hemorrhoid if patient continues to have                        Hematochezia                       -  High fiber diet.                       - Advance diet as tolerated.                       - Continue present medications.                       - The findings and recommendations were discussed with                        the patient.                       - The findings and recommendations were discussed with                        the patient's family.                       - Return to primary care physician as previously                         scheduled.                       - See EGD report and recommendations from today Procedure Code(s):    --- Professional ---                       [phone], Colonoscopy, flexible; diagnostic, including                        collection of specimen(s) by brushing or washing, when                        performed (separate procedure) Diagnosis Code(s):    --- Professional ---                       K64.4, Residual hemorrhoidal skin tags                       K64.8, Other hemorrhoids                       K92.1, Melena (includes Hematochezia)                       K57.30, Diverticulosis of large intestine without                        perforation or abscess without bleeding CPT copyright 2016 American Medical Association. All rights reserved. The codes documented in this report are preliminary and upon coder review may  be revised to meet current compliance requirements.  [name], MD Margretta Sidle B. Bonna Gains MD, MD 03/02/2017 9:49:16 AM This report has been signed electronically. Number of Addenda: 0 Note Initiated On: 03/02/2017 8:06 AM Scope Withdrawal Time: 0 hours 14 minutes 51 seconds  Total Procedure Duration: 0 hours 21 minutes 59 seconds  Estimated Blood Loss: Estimated blood loss: none.      [facility]

## 2017-03-02 NOTE — Plan of Care (Signed)
  Progressing Spiritual Needs Ability to function at adequate level 03/02/2017 0242 - Progressing by Hosie Spangle, RN Education: Knowledge of General Education information will improve 03/02/2017 0242 - Progressing by Hosie Spangle, RN Health Behavior/Discharge Planning: Ability to manage health-related needs will improve 03/02/2017 0242 - Progressing by Hosie Spangle, RN Clinical Measurements: Ability to maintain clinical measurements within normal limits will improve 03/02/2017 0242 - Progressing by Hosie Spangle, RN Will remain free from infection 03/02/2017 0242 - Progressing by Hosie Spangle, RN Diagnostic test results will improve 03/02/2017 0242 - Progressing by Hosie Spangle, RN Respiratory complications will improve 03/02/2017 0242 - Progressing by Hosie Spangle, RN Cardiovascular complication will be avoided 03/02/2017 0242 - Progressing by Hosie Spangle, RN Safety: Ability to remain free from injury will improve 03/02/2017 0242 - Progressing by Hosie Spangle, RN

## 2017-03-02 NOTE — Anesthesia Post-op Follow-up Note (Signed)
Anesthesia QCDR form completed.        

## 2017-03-02 NOTE — Discharge Instructions (Addendum)
Consult surgery for hemorrhoidectomy of the large external hemorrhoid if patient continues to have Hematochezia  High fiber diet.

## 2017-03-02 NOTE — Op Note (Addendum)
[facility] Gastroenterology Patient Name: Patricia Hoover Procedure Date: 03/02/2017 8:07 AM MRN: [id number] Account #: [id number] Date of Birth: 09-05-1945 Admit Type: Inpatient Age: 72 Room: [facility] ENDO ROOM 1 Gender: Female Note Status: Finalized Procedure:            Upper GI endoscopy Indications:          Hematochezia Providers:             B. Bonna Gains MD, MD Referring MD:         Elveria Rising. Damita Dunnings, MD (Referring MD) Medicines:            Monitored Anesthesia Care Complications:        No immediate complications. Procedure:            Pre-Anesthesia Assessment:                       - The risks and benefits of the procedure and the                        sedation options and risks were discussed with the                        patient. All questions were answered and informed                        consent was obtained.                       - Patient identification and proposed procedure were                        verified prior to the procedure.                       - ASA Grade Assessment: II - A patient with mild                        systemic disease.                       After obtaining informed consent, the endoscope was                        passed under direct vision. Throughout the procedure,                        the patient's blood pressure, pulse, and oxygen                        saturations were monitored continuously. The Endoscope                        was introduced through the mouth, and advanced to the                        duodenal bulb. The upper GI endoscopy was accomplished                        with ease. The patient tolerated the procedure well. Findings:      The examined esophagus was normal.      The Z-line  was regular.      Patchy atrophic mucosa was found in the entire examined stomach.       Biopsies were obtained in the gastric body, at the incisura and in the       gastric antrum with cold forceps for  histology.      One non-bleeding duodenal ulcer with no stigmata of bleeding was found       in the duodenal bulb. The lesion was 4 mm in largest dimension. The       second portion of the duodenum could not be intubated due to the       presence of the ulcer in the bulb which did not allow easy passage into       the second portion. Erythema and mild oozing of blood was seen at the       site of the ulcer after the scope was attempted to be advanced into the       second portion. The oozing spontaneously stopped. To prevent further       oozing of blood at the site, did not attempt passing the endoscope       through the site again.      A small hiatal hernia was present. Impression:           - Normal esophagus.                       - Z-line regular.                       - Gastric mucosal atrophy.                       - One non-bleeding duodenal ulcer with no stigmata of                        bleeding. This is unlikely to be the source of                        patient's hematochezia given her clinical presenation                        and lab findings. The non significant drop in her Hgb                        since admission is consistent with her hematochezia                        being from the hemorrhoids seen on her colonoscopy                        today. The ulcer is likely due to her NSAID use.                        Testing for H. Pylori with biopsies pending.                       - Small hiatal hernia.                       - Biopsies were obtained in the gastric body, at the  incisura and in the gastric antrum. Recommendation:       - Await pathology results.                       - Use Prilosec (omeprazole) 20 mg PO BID.                       - Continue Serial CBCs and transfuse PRN                       - Stop NSAID use (like Ibuprofen, Aleeve, Motrin,                        Advil, Meloxicam, Goodie Powder, BC powder etc.) except                         for Aspirin if medically indicated by PCP or cardiology                       - Repeat upper endoscopy in 4 weeks to evaluate the                        response to therapy.                       - Return to GI office in 2 weeks.                       - The findings and recommendations were discussed with                        the patient.                       - The findings and recommendations were discussed with                        the patient's family. Procedure Code(s):    --- Professional ---                       [phone], Esophagogastroduodenoscopy, flexible, transoral;                        with biopsy, single or multiple Diagnosis Code(s):    --- Professional ---                       K31.89, Other diseases of stomach and duodenum                       K26.9, Duodenal ulcer, unspecified as acute or chronic,                        without hemorrhage or perforation                       K92.1, Melena (includes Hematochezia) CPT copyright 2016 American Medical Association. All rights reserved. The codes documented in this report are preliminary and upon coder review may  be revised to meet current compliance requirements.  [name], MD Margretta Sidle B. Bonna Gains MD, MD 03/02/2017 9:17:20 AM This report has been signed electronically. Number  of Addenda: 0 Note Initiated On: 03/02/2017 8:07 AM      [facility]

## 2017-03-02 NOTE — Progress Notes (Signed)
Pt D/C to home with family. IV removed intact. VSS. No falls this shift. All information given. All questions answered

## 2017-03-02 NOTE — Anesthesia Preprocedure Evaluation (Signed)
Anesthesia Evaluation  Patient identified by MRN, date of birth, ID band Patient awake    Reviewed: Allergy & Precautions, NPO status , Patient's Chart, lab work & pertinent test results  History of Anesthesia Complications Negative for: history of anesthetic complications  Airway Mallampati: II  TM Distance: >3 FB Neck ROM: Full    Dental no notable dental hx.    Pulmonary neg sleep apnea, neg COPD, former smoker,    breath sounds clear to auscultation- rhonchi (-) wheezing      Cardiovascular hypertension, Pt. on medications (-) CAD, (-) Past MI, (-) Cardiac Stents and (-) CABG  Rhythm:Regular Rate:Normal - Systolic murmurs and - Diastolic murmurs    Neuro/Psych negative neurological ROS  negative psych ROS   GI/Hepatic Neg liver ROS, GIB, hx of diverticulosis    Endo/Other  negative endocrine ROSneg diabetes  Renal/GU Renal disease: hx of nephrolithiasis.     Musculoskeletal negative musculoskeletal ROS (+)   Abdominal (+) + obese,   Peds  Hematology negative hematology ROS (+)   Anesthesia Other Findings Past Medical History: No date: Arrhythmia     Comment:  possible hx, resolved prev 1972: Blood transfusion without reported diagnosis     Comment:  had reaction; had to stop No date: Carpal tunnel syndrome 2012: H/O exercise stress test     Comment:  normal No date: Heart murmur     Comment:  as child No date: High cholesterol No date: History of kidney stones No date: Hx of colonic polyps No date: Hypertension 2010: Osteoporosis     Comment:  t score - 3.9 No date: Renal stones   Reproductive/Obstetrics                             Anesthesia Physical Anesthesia Plan  ASA: II  Anesthesia Plan: General   Post-op Pain Management:    Induction: Intravenous  PONV Risk Score and Plan: 2 and Propofol infusion  Airway Management Planned: Natural Airway  Additional  Equipment:   Intra-op Plan:   Post-operative Plan:   Informed Consent: I have reviewed the patients History and Physical, chart, labs and discussed the procedure including the risks, benefits and alternatives for the proposed anesthesia with the patient or authorized representative who has indicated his/her understanding and acceptance.   Dental advisory given  Plan Discussed with: CRNA and Anesthesiologist  Anesthesia Plan Comments:         Anesthesia Quick Evaluation

## 2017-03-02 NOTE — Discharge Summary (Signed)
[facility] NAME: Patricia Hoover    MR#:  [id number]  DATE OF BIRTH:  1945-06-30  DATE OF ADMISSION:  02/28/2017   ADMITTING PHYSICIAN: Demetrios Loll, MD  DATE OF DISCHARGE: 03/02/2017 PRIMARY CARE PHYSICIAN: Tonia Ghent, MD   ADMISSION DIAGNOSIS:  rectal bleed DISCHARGE DIAGNOSIS:  Active Problems:   GIB (gastrointestinal bleeding)   Hematochezia   External hemorrhoids   Diverticulosis of large intestine without diverticulitis   Internal hemorrhoids   Duodenal ulceration   Stomach irritation  SECONDARY DIAGNOSIS:   Past Medical History:  Diagnosis Date  . Arrhythmia    possible hx, resolved prev  . Blood transfusion without reported diagnosis 1972   had reaction; had to stop  . Carpal tunnel syndrome   . H/O exercise stress test 2012   normal  . Heart murmur    as child  . High cholesterol   . History of kidney stones   . Hx of colonic polyps   . Hypertension   . Osteoporosis 2010   t score - 3.9  . Renal stones    HOSPITAL COURSE:   72 year old female with past medical history of hypertension, hyperlipidemia, history of nephrolithiasis, colonic polyps, osteoporosis who presented to the hospital due to multiple episodes of rectal bleeding.  1. GI bleed-this is a lower GI bleed due to diverticulosis and  largeexternal hemorrhoid  The patient does have a previous history of diverticulosis and colonic polyps. Discontinued aspirin and ibuprofen. EGD: duodenal ulcer Colonoscopy today: Diverticulosis in the sigmoid colon, in the descending colon and in the ascending colon. largeexternal hemorrhoid. Per Dr. Bonna Gains, PPI bid for 1 month and hydrocortisone suppository for 10 days, consult surgery for hemorrhoidectomy of the largeexternal hemorrhoid if patient continues to haveHematochezia  High fiber diet. Hemoglobin is stable.  2. HTN - cont. Lisinopril.    3. GERD - cont. Protonix.   4. Hypokalemia  -she was given potassium supplement.  Discussed plan of care with Dr. Bonna Gains.  DISCHARGE CONDITIONS:  Stable, discharge to home today. CONSULTS OBTAINED:  Treatment Team:  Virgel Manifold, MD DRUG ALLERGIES:   Allergies  Allergen Reactions  . Apple Other (See Comments)    heartburn  . Bisphosphonates     GI side effects GI side effects  . Prolia [Denosumab]     GI upset GI upset   DISCHARGE MEDICATIONS:   Allergies as of 03/02/2017      Reactions   Apple Other (See Comments)   heartburn   Bisphosphonates    GI side effects GI side effects   Prolia [denosumab]    GI upset GI upset      Medication List    STOP taking these medications   aspirin 81 MG tablet   ibuprofen 200 MG tablet Commonly known as:  ADVIL     TAKE these medications   cholecalciferol 1000 units tablet Commonly known as:  VITAMIN D Take 2 tablets (2,000 Units total) by mouth daily.   hydrocortisone 25 MG suppository Commonly known as:  ANUSOL-HC Place 1 suppository (25 mg total) rectally 2 (two) times daily.   lisinopril-hydrochlorothiazide 10-12.5 MG tablet Commonly known as:  PRINZIDE,ZESTORETIC Take 1 tablet by mouth daily.   pantoprazole 40 MG tablet Commonly known as:  PROTONIX Take 1 tablet (40 mg total) by mouth 2 (two) times daily before a meal.   simvastatin 10 MG tablet Commonly known as:  ZOCOR Take 1 tablet (10 mg total) by  mouth daily.        DISCHARGE INSTRUCTIONS:  See AVS.  If you experience worsening of your admission symptoms, develop shortness of breath, life threatening emergency, suicidal or homicidal thoughts you must seek medical attention immediately by calling 911 or calling your MD immediately  if symptoms less severe.  You Must read complete instructions/literature along with all the possible adverse reactions/side effects for all the Medicines you take and that have been prescribed to you. Take any new Medicines after you have completely  understood and accpet all the possible adverse reactions/side effects.   Please note  You were cared for by a hospitalist during your hospital stay. If you have any questions about your discharge medications or the care you received while you were in the hospital after you are discharged, you can call the unit and asked to speak with the hospitalist on call if the hospitalist that took care of you is not available. Once you are discharged, your primary care physician will handle any further medical issues. Please note that NO REFILLS for any discharge medications will be authorized once you are discharged, as it is imperative that you return to your primary care physician (or establish a relationship with a primary care physician if you do not have one) for your aftercare needs so that they can reassess your need for medications and monitor your lab values.    On the day of Discharge:  VITAL SIGNS:  Blood pressure (!) 131/56, pulse 79, temperature 98.4 F (36.9 C), temperature source Tympanic, resp. rate 17, height 4\' 10"  (1.473 m), weight 166 lb 6.4 oz (75.5 kg), SpO2 100 %. PHYSICAL EXAMINATION:  GENERAL:  72 y.o.-year-old patient lying in the bed with no acute distress.  EYES: Pupils equal, round, reactive to light and accommodation. No scleral icterus. Extraocular muscles intact.  HEENT: Head atraumatic, normocephalic. Oropharynx and nasopharynx clear.  NECK:  Supple, no jugular venous distention. No thyroid enlargement, no tenderness.  LUNGS: Normal breath sounds bilaterally, no wheezing, rales,rhonchi or crepitation. No use of accessory muscles of respiration.  CARDIOVASCULAR: S1, S2 normal. No murmurs, rubs, or gallops.  ABDOMEN: Soft, non-tender, non-distended. Bowel sounds present. No organomegaly or mass.  EXTREMITIES: No pedal edema, cyanosis, or clubbing.  NEUROLOGIC: Cranial nerves II through XII are intact. Muscle strength 5/5 in all extremities. Sensation intact. Gait not checked.   PSYCHIATRIC: The patient is alert and oriented x 3.  SKIN: No obvious rash, lesion, or ulcer.  DATA REVIEW:   CBC Recent Labs  Lab 02/28/17 1539  03/02/17 1207  WBC 11.0  --   --   HGB 13.4   < > 12.5  HCT 39.7  --   --   PLT 252  --   --    < > = values in this interval not displayed.    Chemistries  Recent Labs  Lab 02/28/17 1539 02/28/17 2042 03/01/17 0156  NA 140  --  141  K 3.3*  --  3.4*  CL 104  --  107  CO2 24  --  25  GLUCOSE 116*  --  120*  BUN 12  --  11  CREATININE 0.64  --  0.56  CALCIUM 8.9  --  8.3*  MG  --  2.0  --   AST 17  --   --   ALT 12*  --   --   ALKPHOS 83  --   --   BILITOT 0.5  --   --  Microbiology Results  No results found for this or any previous visit.  RADIOLOGY:  No results found.   Management plans discussed with the patient, family and they are in agreement.  CODE STATUS: Full Code   TOTAL TIME TAKING CARE OF THIS PATIENT: 33 minutes.    Demetrios Loll M.D on 03/02/2017 at 1:15 PM  Between 7am to 6pm - Pager - [phone]  After 6pm go to [email or web address] - password [email or web address] [place]  [facility]  Office  [phone]  CC: Primary care physician; Tonia Ghent, MD   Note: This dictation was prepared with Dragon dictation along with smaller phrase technology. Any transcriptional errors that result from this process are unintentional.

## 2017-03-02 NOTE — Transfer of Care (Signed)
Immediate Anesthesia Transfer of Care Note  Patient: Patricia Hoover  Procedure(s) Performed: COLONOSCOPY (Left ) ESOPHAGOGASTRODUODENOSCOPY (EGD) (Left )  Patient Location: PACU and Endoscopy Unit  Anesthesia Type:General  Level of Consciousness: drowsy  Airway & Oxygen Therapy: Patient Spontanous Breathing and Patient connected to nasal cannula oxygen  Post-op Assessment: Report given to RN and Post -op Vital signs reviewed and stable  Post vital signs: Reviewed and stable  Last Vitals:  Vitals:   03/02/17 0754 03/02/17 0946  BP: (!) 166/74 (P) 112/62  Pulse: 79   Resp: 17   Temp: (!) 35.7 C 36.9 C  SpO2: 100%     Last Pain:  Vitals:   03/02/17 0946  TempSrc: Tympanic  PainSc:          Complications: No apparent anesthesia complications

## 2017-03-03 ENCOUNTER — Encounter: Payer: Self-pay | Admitting: Gastroenterology

## 2017-03-03 ENCOUNTER — Other Ambulatory Visit: Payer: Self-pay

## 2017-03-03 ENCOUNTER — Telehealth: Payer: Self-pay | Admitting: *Deleted

## 2017-03-03 DIAGNOSIS — K269 Duodenal ulcer, unspecified as acute or chronic, without hemorrhage or perforation: Secondary | ICD-10-CM

## 2017-03-03 NOTE — Telephone Encounter (Signed)
Transition Care Management Follow-up Telephone Call   Date discharged? 03/02/2017   How have you been since you were released from the hospital? "ok. I was fine when it happened."   Do you understand why you were in the hospital? yes   Do you understand the discharge instructions? yes   Where were you discharged to? home   Items Reviewed:  Medications reviewed: yes  Allergies reviewed: yes  Dietary changes reviewed: yes  Referrals reviewed: yes   Functional Questionnaire:  Activities of Daily Living (ADLs):   She states they are independent in the following: in all areas   Any transportation issues/concerns?: no   Any patient concerns? no   Confirmed importance and date/time of follow-up visits scheduled yes  Provider Appointment booked with Dr Damita Dunnings 2/26 @ 0945  Confirmed with patient if condition begins to worsen call PCP or go to the ER.  Patient was given the office number and encouraged to call back with question or concerns.  : yes

## 2017-03-04 LAB — SURGICAL PATHOLOGY

## 2017-03-07 ENCOUNTER — Telehealth: Payer: Self-pay

## 2017-03-07 ENCOUNTER — Other Ambulatory Visit: Payer: Self-pay | Admitting: Gastroenterology

## 2017-03-07 DIAGNOSIS — A048 Other specified bacterial intestinal infections: Secondary | ICD-10-CM

## 2017-03-07 MED ORDER — AMOXICILLIN 500 MG PO TABS: 1000.0000 mg | ORAL_TABLET | Freq: Two times a day (BID) | ORAL | 0 refills | Status: AC

## 2017-03-07 MED ORDER — CLARITHROMYCIN 250 MG PO TABS: 500.0000 mg | ORAL_TABLET | Freq: Two times a day (BID) | ORAL | 0 refills | Status: AC

## 2017-03-07 NOTE — Telephone Encounter (Signed)
Pt notified of H Pylori bacteria present and of need to take ATB and to hold Simivastin if PCP approves for 14 days while on ATB. To call PCP if she hasn't heard anything by lunch. Has appt to come in on 03/09/17 already scheduled and will keep this appt.

## 2017-03-07 NOTE — Telephone Encounter (Signed)
-----   Message from Virgel Manifold, MD sent at 03/07/2017  3:19 PM EST ----- Jackelyn Poling please let patient know her biopsy results showed H Pylori bacteria. [facility] that she should take for 14 days. She should continue her pantoprazole in the meantime, twice a day.  Please tell her, her cholesterol medication (Simvastatin) can interact with the antibiotics and if it is ok with her PCP she can hold the cholesterol medication for the 14 days she is taking the antibiotics. I am copying the PCP, Dr. Damita Dunnings on the note as well. Please have her follow up with me in clinic as well this week or next week. I will let Traci know to make this appointment.

## 2017-03-09 ENCOUNTER — Ambulatory Visit (INDEPENDENT_AMBULATORY_CARE_PROVIDER_SITE_OTHER): Payer: Medicare Other | Admitting: Gastroenterology

## 2017-03-09 ENCOUNTER — Telehealth: Payer: Self-pay | Admitting: Gastroenterology

## 2017-03-09 ENCOUNTER — Encounter: Payer: Self-pay | Admitting: Gastroenterology

## 2017-03-09 ENCOUNTER — Other Ambulatory Visit: Payer: Self-pay

## 2017-03-09 VITALS — BP 132/76 | HR 76 | Ht <= 58 in | Wt 166.8 lb

## 2017-03-09 DIAGNOSIS — K269 Duodenal ulcer, unspecified as acute or chronic, without hemorrhage or perforation: Secondary | ICD-10-CM

## 2017-03-09 DIAGNOSIS — K5731 Diverticulosis of large intestine without perforation or abscess with bleeding: Secondary | ICD-10-CM

## 2017-03-09 DIAGNOSIS — K648 Other hemorrhoids: Secondary | ICD-10-CM | POA: Diagnosis not present

## 2017-03-09 NOTE — Progress Notes (Signed)
[name], MD [address]  [place]  [place], [place] [address]  Main: [phone]  Fax: [phone]   Primary Care Physician: Tonia Ghent, MD  Primary Gastroenterologist:  Dr. [name] Chief complaint: Hospitalization follow-up for hematochezia and gastric ulcers  HPI: Patricia Hoover is a 72 y.o. female initially seen on March 01, 2017 when she was admitted for hematochezia.  She underwent EGD on March 02, 2017 that showed a 4 mm nonbleeding duodenal bulb ulcer that prevented scope advancement of the second portion.  However, patient was not have any obstructive symptoms.  Gastric mucosal atrophy was seen.  Biopsies were positive for H. pylori and.  patient was prescribed triple therapy.  Colonoscopy done March 02, 2017 showed nonbleeding diverticulosis, internal hemorrhoids, extent of exam terminal ileum.  Her hematochezia was thought to be due to the hemorrhoids as her hemoglobin did not drop far below her baseline, and was 11.8 at its lowest.  Patient's last colonoscopy was in April 2018, and it was done to follow-up for rectal bleeding just prior to the procedure.  The colonoscopy was done by Dr. Vicente Males, and showed 2 small polyps that were removed, nonbleeding internal hemorrhoids, and diverticulosis.  Pathology report showed tubular adenoma and hyperplastic polyp.  She had a colonoscopy in August 2016 by Dr. Elmo Putt, diverticulosis was seen, and one small polyp was removed at that time.  Polyp was tubular adenoma.  Patient has not had any bright red blood per rectum since discharge.  She has utilized the steroid suppositories that were prescribed to her for hemorrhoids.  Denies any abdominal pain, no nausea vomiting.  No weight loss    Current Outpatient Medications  Medication Sig Dispense Refill  . amoxicillin (AMOXIL) 500 MG tablet Take 2 tablets (1,000 mg total) by mouth 2 (two) times daily for 14 days. 56 tablet 0  . cholecalciferol (VITAMIN D)  1000 units tablet Take 2 tablets (2,000 Units total) by mouth daily.    . clarithromycin (BIAXIN) 250 MG tablet Take 2 tablets (500 mg total) by mouth 2 (two) times daily for 14 days. 56 tablet 0  . hydrocortisone (ANUSOL-HC) 25 MG suppository Place 1 suppository (25 mg total) rectally 2 (two) times daily. 20 suppository 0  . lisinopril-hydrochlorothiazide (PRINZIDE,ZESTORETIC) 10-12.5 MG tablet Take 1 tablet by mouth daily. 90 tablet 3  . pantoprazole (PROTONIX) 40 MG tablet Take 1 tablet (40 mg total) by mouth 2 (two) times daily before a meal. 60 tablet 0  . simvastatin (ZOCOR) 10 MG tablet Take 1 tablet (10 mg total) by mouth daily. 90 tablet 3   No current facility-administered medications for this visit.     Allergies as of 03/09/2017 - Review Complete 03/02/2017  Allergen Reaction Noted  . Apple Other (See Comments) 10/25/2014  . Bisphosphonates  08/18/2012  . Prolia [denosumab]  06/21/2013    ROS:  General: Negative for anorexia, weight loss, fever, chills, fatigue, weakness. ENT: Negative for hoarseness, difficulty swallowing , nasal congestion. CV: Negative for chest pain, angina, palpitations, dyspnea on exertion, peripheral edema.  Respiratory: Negative for dyspnea at rest, dyspnea on exertion, cough, sputum, wheezing.  GI: See history of present illness. GU:  Negative for dysuria, hematuria, urinary incontinence, urinary frequency, nocturnal urination.  Endo: Negative for unusual weight change.    Physical Examination:   There were no vitals taken for this visit.  General: Well-nourished, well-developed in no acute distress.  Eyes: No icterus. Conjunctivae pink. Mouth: Oropharyngeal mucosa moist and pink , no  lesions erythema or exudate. Neck: Supple, Trachea midline Abdomen: Bowel sounds are normal, nontender, nondistended, no hepatosplenomegaly or masses, no abdominal bruits or hernia , no rebound or guarding.   Extremities: No lower extremity edema. No clubbing or  deformities. Neuro: Alert and oriented x 3.  Grossly intact. Skin: Warm and dry, no jaundice.   Psych: Alert and cooperative, normal mood and affect.   Labs: CMP     Component Value Date/Time   NA 141 03/01/2017 0156   K 3.4 (L) 03/01/2017 0156   CL 107 03/01/2017 0156   CO2 25 03/01/2017 0156   GLUCOSE 120 (H) 03/01/2017 0156   BUN 11 03/01/2017 0156   BUN 12 07/06/2011   CREATININE 0.56 03/01/2017 0156   CREATININE 0.68 01/31/2014 1652   CALCIUM 8.3 (L) 03/01/2017 0156   PROT 7.0 02/28/2017 1539   ALBUMIN 3.6 02/28/2017 1539   AST 17 02/28/2017 1539   AST 12 07/06/2011   ALT 12 (L) 02/28/2017 1539   ALKPHOS 83 02/28/2017 1539   BILITOT 0.5 02/28/2017 1539   GFRNONAA >60 03/01/2017 0156   GFRAA >60 03/01/2017 0156   Lab Results  Component Value Date   WBC 11.0 02/28/2017   HGB 12.5 03/02/2017   HCT 39.7 02/28/2017   MCV 86.5 02/28/2017   PLT 252 02/28/2017    Imaging Studies: No results found.  Assessment and Plan:   Goddess Gebbia is a 72 y.o. y/o female here for posthospitalization follow-up for hematochezia, and found to have a duodenal bulb 4 mm nonbleeding ulcer, H. pylori gastritis and stomach biopsies, diverticulosis, and internal hemorrhoids   source of hematochezia thought to be internal hemorrhoids during her admission Duodenal bulb ulcer likely due to H. pylori, versus NSAID use H. pylori therapy prescribed, and patient asked to pick it up from the pharmacy Patient's primary care provider notified, and is okay with holding simvastatin for the duration of her antibiotic course Repeat EGD scheduled in March  to reevaluate ulcer site, as second portion of duodenum could not be intubated due to the also present in the duodenal bulb  patient asked to avoid NSAIDs, besides the aspirin 81 mg daily if her primary care Provider has prescribed that to her  For her hemorrhoids, she is maintaining a high-fiber diet, and is now reporting one soft bowel movement daily  without straining Hematochezia has resolved  Dr [name]

## 2017-03-09 NOTE — Telephone Encounter (Signed)
Patient called stating medication needed to be precerted through her mail order pharmacy.Please return her call @ [phone].She stating that if she was unable to take the call she wanted you to have the information to [facility] [phone].

## 2017-03-09 NOTE — Patient Instructions (Addendum)
Helicobacter Pylori Infection Helicobacter pylori infection is an infection in the stomach that is caused by the Helicobacter pylori (H. pylori) bacteria. This type of bacteria often lives in the lining of the stomach. The infection can cause ulcers and irritation (gastritis) in some people. It is the most common cause of ulcers in the stomach (gastric ulcer) and in the upper part of the intestine (duodenal ulcer). Having this infection may also increase the risk of stomach cancer and a type of white blood cell cancer (lymphoma) that affects the stomach. What are the causes? H. pylori is a type of bacteria that is often found in the stomachs of healthy people. The bacteria may be passed from person to person through contact with stool or saliva. It is not known why some people develop ulcers, gastritis, or cancer from the infection. What increases the risk? This condition is more likely to develop in people who:  Have family members with the infection.  Live with many other people, such as in a dormitory.  Are of African, Hispanic, or Asian descent.  What are the signs or symptoms? Most people with this infection do not have symptoms. If you do have symptoms, they may include:  Heartburn.  Stomach pain.  Nausea.  Vomiting.  Blood-tinged vomit.  Loss of appetite.  Bad breath.  How is this diagnosed? This condition may be diagnosed based on your symptoms, a physical exam, and various tests. Tests may include:  Blood tests or stool tests to check for the proteins (antibodies) that your body may produce in response to the bacteria. These tests are the best way to confirm the diagnosis.  A breath test to check for the type of gas that the H. pylori bacteria release after breaking down a substance called urea. For the test, you are asked to drink urea. This test is often done after treatment in order to find out if the treatment worked.  A procedure in which a thin, flexible tube  with a tiny camera at the end is placed into your stomach and upper intestine (upper endoscopy). Your health care provider may also take tissue samples (biopsy) to test for H. pylori and cancer.  How is this treated? Treatment for this condition usually involves taking a combination of medicines (triple therapy) for a couple of weeks. Triple therapy includes one medicine to reduce the acid in your stomach and two types of antibiotic medicines. Many drug combinations have been approved for treatment. Treatment usually kills the H. pylori and reduces your risk of cancer. You may need to be tested for H. pylori again after treatment. In some cases, the treatment may need to be repeated. Follow these instructions at home:  Take over-the-counter and prescription medicines only as told by your health care provider.  Take your antibiotics as told by your health care provider. Do not stop taking the antibiotics even if you start to feel better.  You can do all your usual activities and eat what you usually do.  Take steps to prevent future infections: ? Wash your hands often. ? Make sure the food you eat has been properly prepared. ? Drink water only from clean sources.  Keep all follow-up visits as told by your health care provider. This is important. Contact a health care provider if:  Your symptoms do not get better.  Your symptoms return after treatment. This information is not intended to replace advice given to you by your health care provider. Make sure you discuss any  questions you have with your health care provider. Document Released: 04/28/2015 Document Revised: 06/12/2015 Document Reviewed: 01/16/2014 Elsevier Interactive Patient Education  2018 [employer].  High-Fiber Diet Fiber, also called dietary fiber, is a type of carbohydrate found in fruits, vegetables, whole grains, and beans. A high-fiber diet can have many health benefits. Your health care provider may recommend a  high-fiber diet to help:  Prevent constipation. Fiber can make your bowel movements more regular.  Lower your cholesterol.  Relieve hemorrhoids, uncomplicated diverticulosis, or irritable bowel syndrome.  Prevent overeating as part of a weight-loss plan.  Prevent heart disease, type 2 diabetes, and certain cancers.  What is my plan? The recommended daily intake of fiber includes:  38 grams for men under age 72.  70 grams for men over age 28.  9 grams for women under age 47.  68 grams for women over age 15.  You can get the recommended daily intake of dietary fiber by eating a variety of fruits, vegetables, grains, and beans. Your health care provider may also recommend a fiber supplement if it is not possible to get enough fiber through your diet. What do I need to know about a high-fiber diet?  Fiber supplements have not been widely studied for their effectiveness, so it is better to get fiber through food sources.  Always check the fiber content on thenutrition facts label of any prepackaged food. Look for foods that contain at least 5 grams of fiber per serving.  Ask your dietitian if you have questions about specific foods that are related to your condition, especially if those foods are not listed in the following section.  Increase your daily fiber consumption gradually. Increasing your intake of dietary fiber too quickly may cause bloating, cramping, or gas.  Drink plenty of water. Water helps you to digest fiber. What foods can I eat? Grains Whole-grain breads. Multigrain cereal. Oats and oatmeal. Brown rice. Barley. Bulgur wheat. [place]. Bran muffins. Popcorn. Rye wafer crackers. Vegetables Sweet potatoes. Spinach. Kale. Artichokes. Cabbage. Broccoli. Green peas. Carrots. Squash. Fruits Berries. Pears. Apples. Oranges. Avocados. Prunes and raisins. Dried figs. Meats and Other Protein Sources Navy, kidney, pinto, and soy beans. Split peas. Lentils. Nuts and  seeds. Dairy Fiber-fortified yogurt. Beverages Fiber-fortified soy milk. Fiber-fortified orange juice. Other Fiber bars. The items listed above may not be a complete list of recommended foods or beverages. Contact your dietitian for more options. What foods are not recommended? Grains White bread. Pasta made with refined flour. White rice. Vegetables Fried potatoes. Canned vegetables. Well-cooked vegetables. Fruits Fruit juice. Cooked, strained fruit. Meats and Other Protein Sources Fatty cuts of meat. Fried Sales executive or fried fish. Dairy Milk. Yogurt. Cream cheese. Sour cream. Beverages Soft drinks. Other Cakes and pastries. Butter and oils. The items listed above may not be a complete list of foods and beverages to avoid. Contact your dietitian for more information. What are some tips for including high-fiber foods in my diet?  Eat a wide variety of high-fiber foods.  Make sure that half of all grains consumed each day are whole grains.  Replace breads and cereals made from refined flour or white flour with whole-grain breads and cereals.  Replace white rice with brown rice, bulgur wheat, or millet.  Start the day with a breakfast that is high in fiber, such as a cereal that contains at least 5 grams of fiber per serving.  Use beans in place of meat in soups, salads, or pasta.  Eat high-fiber snacks, such as  berries, raw vegetables, nuts, or popcorn. This information is not intended to replace advice given to you by your health care provider. Make sure you discuss any questions you have with your health care provider. Document Released: 01/04/2005 Document Revised: 06/12/2015 Document Reviewed: 06/19/2013 Elsevier Interactive Patient Education  [employer].

## 2017-03-10 NOTE — Telephone Encounter (Signed)
I spoke with pt this am and she had talked with insurance company and the problem was taking the medication that interacts with another. Pt and PCP aware of stopping simvastatin while taking this medication (Biaxin). [facility] and let [facility] know that this was already stopped.

## 2017-03-15 ENCOUNTER — Ambulatory Visit (INDEPENDENT_AMBULATORY_CARE_PROVIDER_SITE_OTHER): Payer: Medicare Other | Admitting: Family Medicine

## 2017-03-15 ENCOUNTER — Encounter: Payer: Self-pay | Admitting: Family Medicine

## 2017-03-15 VITALS — BP 108/70 | HR 75 | Temp 98.4°F | Wt 165.5 lb

## 2017-03-15 DIAGNOSIS — K922 Gastrointestinal hemorrhage, unspecified: Secondary | ICD-10-CM

## 2017-03-15 LAB — BASIC METABOLIC PANEL
BUN: 19 mg/dL (ref 6–23)
CALCIUM: 9.7 mg/dL (ref 8.4–10.5)
CO2: 28 meq/L (ref 19–32)
CREATININE: 0.74 mg/dL (ref 0.40–1.20)
Chloride: 101 mEq/L (ref 96–112)
GFR: 99.1 mL/min (ref >60.00)
GLUCOSE: 97 mg/dL (ref 70–99)
Potassium: 3.3 mEq/L — ABNORMAL LOW (ref 3.5–5.1)
Sodium: 138 mEq/L (ref 135–145)

## 2017-03-15 LAB — CBC WITH DIFFERENTIAL/PLATELET
Basophils Absolute: 0.1 10*3/uL (ref 0.0–0.1)
Basophils Relative: 0.7 % (ref 0.0–3.0)
EOS PCT: 1.3 % (ref 0.0–5.0)
Eosinophils Absolute: 0.1 10*3/uL (ref 0.0–0.7)
HEMATOCRIT: 39.2 % (ref 36.0–46.0)
HEMOGLOBIN: 13.4 g/dL (ref 12.0–15.0)
LYMPHS ABS: 2.7 10*3/uL (ref 0.7–4.0)
Lymphocytes Relative: 27.4 % (ref 12.0–46.0)
MCHC: 34.2 g/dL (ref 30.0–36.0)
MCV: 86.5 fl (ref 78.0–100.0)
Monocytes Absolute: 0.5 10*3/uL (ref 0.1–1.0)
Monocytes Relative: 5.6 % (ref 3.0–12.0)
NEUTROS PCT: 65 % (ref 43.0–77.0)
Neutro Abs: 6.3 10*3/uL (ref 1.4–7.7)
Platelets: 286 10*3/uL (ref 150.0–400.0)
RBC: 4.53 Mil/uL (ref 3.87–5.11)
RDW: 13.9 % (ref 11.5–15.5)
WBC: 9.7 10*3/uL (ref 4.0–10.5)

## 2017-03-15 NOTE — Progress Notes (Signed)
  DATE OF ADMISSION:  02/28/2017      ADMITTING PHYSICIAN: Demetrios Loll, MD  DATE OF DISCHARGE: 03/02/2017 PRIMARY CARE PHYSICIAN: Tonia Ghent, MD   ADMISSION DIAGNOSIS:  rectal bleed DISCHARGE DIAGNOSIS:  Active Problems:   GIB (gastrointestinal bleeding)   Hematochezia   External hemorrhoids   Diverticulosis of large intestine without diverticulitis   Internal hemorrhoids   Duodenal ulceration   Stomach irritation  SECONDARY DIAGNOSIS:       Past Medical History:  Diagnosis Date  . Arrhythmia    possible hx, resolved prev  . Blood transfusion without reported diagnosis 1972   had reaction; had to stop  . Carpal tunnel syndrome   . H/O exercise stress test 2012   normal  . Heart murmur    as child  . High cholesterol   . History of kidney stones   . Hx of colonic polyps   . Hypertension   . Osteoporosis 2010   t score - 3.9  . Renal stones    HOSPITAL COURSE:   72 year old female with past medical history of hypertension, hyperlipidemia, history ofnephrolithiasis, colonic polyps, osteoporosis who presented to the hospital due to multiple episodes of rectal bleeding.  1. GI bleed-this is a lower GI bleed due to diverticulosis and  largeexternal hemorrhoid  The patient does have a previous history of diverticulosis and colonic polyps. Discontinued aspirin and ibuprofen. EGD: duodenal ulcer Colonoscopy today: Diverticulosis in the sigmoid colon, in thedescending colon and in the ascending colon. largeexternal hemorrhoid. Per Dr. Bonna Gains, PPI bid for 1 month and hydrocortisone suppository for 10 days, consult surgery for hemorrhoidectomy of the largeexternal hemorrhoid if patient continues to haveHematochezia High fiber diet. Hemoglobin is stable.  2. HTN - cont. Lisinopril.   3. GERD - cont. Protonix.   4. Hypokalemia -she was given potassium supplement.  ====================================== Inpatient course d/w pt.  GIB  with upper and lower scope completed.  See above.  No FCNAVD.  No abd pain now.  No more bleeding.  Not lightheaded.  She is on treatment for H pylori and tolerating that.  She is off statin in the meantime.  D/w pt about rationale for her current meds.  She wasn't able to use the suppository in the meantime.  No blood now with stools, no blood per rectum. Still on PPI.    Still on baseline BP meds.  See med list.    PMH and SH reviewed  ROS: Per HPI unless specifically indicated in ROS section   Meds, vitals, and allergies reviewed.   GEN: nad, alert and oriented HEENT: mucous membranes moist NECK: supple w/o LA CV: rrr PULM: ctab, no inc wob ABD: soft, +bs EXT: no edema SKIN: no acute rash

## 2017-03-15 NOTE — Patient Instructions (Addendum)
Okay to take tylenol if needed for aches.  Avoid ibuprofen, etc.   Restart the simvastatin when you are done with the antibiotics.  Take care.  Glad to see you.  Keep the follow up with GI.

## 2017-03-17 NOTE — Assessment & Plan Note (Signed)
See above.  No change in meds.  Finish tx for H pylori and then restart statin.  No blood in stools now per patient report.  She has GI f/u pending. BP is reasonable, recheck basic labs today, okay for outpatient f/u.  All questions answered.

## 2017-03-20 ENCOUNTER — Encounter: Payer: Self-pay | Admitting: Family Medicine

## 2017-03-28 ENCOUNTER — Telehealth: Payer: Self-pay | Admitting: Gastroenterology

## 2017-03-28 NOTE — Telephone Encounter (Signed)
Patient was given Pantoprazole at the hospital and needs to know how long to take it. Please call.

## 2017-03-29 ENCOUNTER — Other Ambulatory Visit: Payer: Self-pay

## 2017-03-29 DIAGNOSIS — K269 Duodenal ulcer, unspecified as acute or chronic, without hemorrhage or perforation: Secondary | ICD-10-CM

## 2017-03-29 DIAGNOSIS — K3189 Other diseases of stomach and duodenum: Secondary | ICD-10-CM

## 2017-03-29 NOTE — Telephone Encounter (Signed)
Pt advised to take Protonix until her next f/u in office. Does not need refills at this time.

## 2017-04-01 ENCOUNTER — Telehealth: Payer: Self-pay | Admitting: Gastroenterology

## 2017-04-01 NOTE — Telephone Encounter (Signed)
Pt advised to restart simvastatin now. She states she completed her ATB about 5 days ago.

## 2017-04-01 NOTE — Telephone Encounter (Signed)
Patient called and finished her 14 day antibiotic. When should she start her simvastastin 10 mg?  Please leave a message if she doesn't answer. Thanks!

## 2017-04-08 ENCOUNTER — Encounter
Admission: RE | Disposition: A | Discharge status: Home / Self Care | Payer: Self-pay | Source: Ambulatory Visit | Attending: Gastroenterology

## 2017-04-08 ENCOUNTER — Ambulatory Visit
Admission: RE | Admit: 2017-04-08 | Discharge: 2017-04-08 | Disposition: A | Discharge status: Home / Self Care | Payer: Medicare Other | Source: Ambulatory Visit | Attending: Gastroenterology | Admitting: Gastroenterology

## 2017-04-08 ENCOUNTER — Ambulatory Visit: Payer: Medicare Other | Admitting: Registered Nurse

## 2017-04-08 DIAGNOSIS — Z8249 Family history of ischemic heart disease and other diseases of the circulatory system: Secondary | ICD-10-CM | POA: Diagnosis not present

## 2017-04-08 DIAGNOSIS — Z87891 Personal history of nicotine dependence: Secondary | ICD-10-CM | POA: Diagnosis not present

## 2017-04-08 DIAGNOSIS — Z87442 Personal history of urinary calculi: Secondary | ICD-10-CM | POA: Insufficient documentation

## 2017-04-08 DIAGNOSIS — E669 Obesity, unspecified: Secondary | ICD-10-CM | POA: Diagnosis not present

## 2017-04-08 DIAGNOSIS — Z86010 Personal history of colonic polyps: Secondary | ICD-10-CM | POA: Insufficient documentation

## 2017-04-08 DIAGNOSIS — Z8719 Personal history of other diseases of the digestive system: Secondary | ICD-10-CM | POA: Insufficient documentation

## 2017-04-08 DIAGNOSIS — K2950 Unspecified chronic gastritis without bleeding: Secondary | ICD-10-CM | POA: Diagnosis not present

## 2017-04-08 DIAGNOSIS — E7800 Pure hypercholesterolemia, unspecified: Secondary | ICD-10-CM | POA: Insufficient documentation

## 2017-04-08 DIAGNOSIS — M810 Age-related osteoporosis without current pathological fracture: Secondary | ICD-10-CM | POA: Diagnosis not present

## 2017-04-08 DIAGNOSIS — K263 Acute duodenal ulcer without hemorrhage or perforation: Secondary | ICD-10-CM

## 2017-04-08 DIAGNOSIS — Z888 Allergy status to other drugs, medicaments and biological substances status: Secondary | ICD-10-CM | POA: Diagnosis not present

## 2017-04-08 DIAGNOSIS — K2289 Other specified disease of esophagus: Secondary | ICD-10-CM

## 2017-04-08 DIAGNOSIS — K228 Other specified diseases of esophagus: Secondary | ICD-10-CM | POA: Diagnosis not present

## 2017-04-08 DIAGNOSIS — K269 Duodenal ulcer, unspecified as acute or chronic, without hemorrhage or perforation: Secondary | ICD-10-CM | POA: Diagnosis present

## 2017-04-08 DIAGNOSIS — K2960 Other gastritis without bleeding: Secondary | ICD-10-CM | POA: Diagnosis not present

## 2017-04-08 DIAGNOSIS — Z886 Allergy status to analgesic agent status: Secondary | ICD-10-CM | POA: Insufficient documentation

## 2017-04-08 DIAGNOSIS — I10 Essential (primary) hypertension: Secondary | ICD-10-CM | POA: Diagnosis not present

## 2017-04-08 DIAGNOSIS — Z6835 Body mass index (BMI) 35.0-35.9, adult: Secondary | ICD-10-CM | POA: Diagnosis not present

## 2017-04-08 DIAGNOSIS — Z79899 Other long term (current) drug therapy: Secondary | ICD-10-CM | POA: Diagnosis not present

## 2017-04-08 DIAGNOSIS — K3189 Other diseases of stomach and duodenum: Secondary | ICD-10-CM

## 2017-04-08 HISTORY — PX: ESOPHAGOGASTRODUODENOSCOPY (EGD) WITH PROPOFOL: SHX5813

## 2017-04-08 HISTORY — DX: Duodenal ulcer, unspecified as acute or chronic, without hemorrhage or perforation: K26.9

## 2017-04-08 SURGERY — ESOPHAGOGASTRODUODENOSCOPY (EGD) WITH PROPOFOL
Anesthesia: General

## 2017-04-08 MED ORDER — SUCCINYLCHOLINE CHLORIDE 20 MG/ML IJ SOLN
INTRAMUSCULAR | Status: AC
  Filled 2017-04-08: qty 1

## 2017-04-08 MED ORDER — LIDOCAINE HCL (PF) 2 % IJ SOLN
INTRAMUSCULAR | Status: AC
Start: 2017-04-08 — End: 2017-04-08
  Filled 2017-04-08: qty 10

## 2017-04-08 MED ORDER — LIDOCAINE HCL (CARDIAC) 20 MG/ML IV SOLN
INTRAVENOUS | Status: DC | PRN
  Administered 2017-04-08: 40 mg via INTRAVENOUS

## 2017-04-08 MED ORDER — PROPOFOL 500 MG/50ML IV EMUL
INTRAVENOUS | Status: DC | PRN
  Administered 2017-04-08: 150 ug/kg/min via INTRAVENOUS

## 2017-04-08 MED ORDER — GLYCOPYRROLATE 0.2 MG/ML IJ SOLN
INTRAMUSCULAR | Status: DC | PRN
  Administered 2017-04-08: 0.2 mg via INTRAVENOUS

## 2017-04-08 MED ORDER — FENTANYL CITRATE (PF) 100 MCG/2ML IJ SOLN
INTRAMUSCULAR | Status: DC | PRN
  Administered 2017-04-08: 50 ug via INTRAVENOUS

## 2017-04-08 MED ORDER — GLYCOPYRROLATE 0.2 MG/ML IJ SOLN
INTRAMUSCULAR | Status: AC
  Filled 2017-04-08: qty 1

## 2017-04-08 MED ORDER — FENTANYL CITRATE (PF) 100 MCG/2ML IJ SOLN
INTRAMUSCULAR | Status: AC
  Filled 2017-04-08: qty 2

## 2017-04-08 MED ORDER — MIDAZOLAM HCL 2 MG/2ML IJ SOLN
INTRAMUSCULAR | Status: DC | PRN
  Administered 2017-04-08 (×2): 1 mg via INTRAVENOUS

## 2017-04-08 MED ORDER — MIDAZOLAM HCL 2 MG/2ML IJ SOLN
INTRAMUSCULAR | Status: AC
  Filled 2017-04-08: qty 2

## 2017-04-08 MED ORDER — PROPOFOL 10 MG/ML IV BOLUS
INTRAVENOUS | Status: AC
  Filled 2017-04-08: qty 40

## 2017-04-08 MED ORDER — PROPOFOL 10 MG/ML IV BOLUS
INTRAVENOUS | Status: DC | PRN
  Administered 2017-04-08: 60 mg via INTRAVENOUS

## 2017-04-08 MED ORDER — SODIUM CHLORIDE 0.9 % IV SOLN
INTRAVENOUS | Status: DC
  Administered 2017-04-08: 08:00:00 via INTRAVENOUS

## 2017-04-08 NOTE — Anesthesia Postprocedure Evaluation (Signed)
Anesthesia Post Note  Patient: Engineer, structural  Procedure(s) Performed: ESOPHAGOGASTRODUODENOSCOPY (EGD) WITH PROPOFOL (N/A )  Patient location during evaluation: Endoscopy Anesthesia Type: General Level of consciousness: awake and alert Pain management: pain level controlled Vital Signs Assessment: post-procedure vital signs reviewed and stable Respiratory status: spontaneous breathing, nonlabored ventilation, respiratory function stable and patient connected to nasal cannula oxygen Cardiovascular status: blood pressure returned to baseline and stable Postop Assessment: no apparent nausea or vomiting Anesthetic complications: no     Last Vitals:  Vitals Value Taken Time  BP    Temp    Pulse    Resp    SpO2      Last Pain:  Vitals:   04/08/17 0838  TempSrc: Tympanic                 Martha Clan

## 2017-04-08 NOTE — H&P (Signed)
Patricia Antigua, MD [address], [place], [place], [place], [address] [address], [place], [place], [place], [address] Phone: [phone]  Fax: [phone]  Primary Care Physician:  Tonia Ghent, MD   Pre-Procedure History & Physical: HPI:  Patricia Hoover is a 72 y.o. female is here for an EGD.   Past Medical History:  Diagnosis Date  . Arrhythmia    possible hx, resolved prev  . Blood transfusion without reported diagnosis 1972   had reaction; had to stop  . Carpal tunnel syndrome   . Duodenal ulcer   . H/O exercise stress test 2012   normal  . Heart murmur    as child  . High cholesterol   . History of kidney stones   . Hx of colonic polyps   . Hypertension   . Osteoporosis 2010   t score - 3.9  . Renal stones     Past Surgical History:  Procedure Laterality Date  . ABDOMINAL HYSTERECTOMY  1972   for endometriosis  . COLONOSCOPY Left 03/02/2017   Procedure: COLONOSCOPY;  Surgeon: Virgel Manifold, MD;  Location: [facility] ENDOSCOPY;  Service: Endoscopy;  Laterality: Left;  . COLONOSCOPY WITH PROPOFOL N/A 05/04/2016   Procedure: COLONOSCOPY WITH PROPOFOL;  Surgeon: Jonathon Bellows, MD;  Location: [facility] ENDOSCOPY;  Service: Endoscopy;  Laterality: N/A;  . ESOPHAGOGASTRODUODENOSCOPY Left 03/02/2017   Procedure: ESOPHAGOGASTRODUODENOSCOPY (EGD);  Surgeon: Virgel Manifold, MD;  Location: [facility] ENDOSCOPY;  Service: Endoscopy;  Laterality: Left;  . LITHOTRIPSY     for kidney stone x5    Prior to Admission medications   Medication Sig Start Date End Date Taking? Authorizing Provider  cholecalciferol (VITAMIN D) 1000 units tablet Take 2 tablets (2,000 Units total) by mouth daily. 12/16/15  Yes Tonia Ghent, MD  lisinopril-hydrochlorothiazide (PRINZIDE,ZESTORETIC) 10-12.5 MG tablet Take 1 tablet by mouth daily. 12/20/16  Yes Tonia Ghent, MD  pantoprazole (PROTONIX) 40 MG tablet Take 1 tablet (40 mg total) by mouth 2 (two) times daily before a meal. 03/02/17   Yes Demetrios Loll, MD  shark liver oil-cocoa butter (PREPARATION H) 0.25-3-85.5 % suppository Place 1 suppository rectally as needed for hemorrhoids.   Yes [provider]  simvastatin (ZOCOR) 10 MG tablet Take 1 tablet (10 mg total) by mouth daily. 12/20/16  Yes Tonia Ghent, MD    Allergies as of 03/03/2017 - Review Complete 03/02/2017  Allergen Reaction Noted  . Apple Other (See Comments) 10/25/2014  . Bisphosphonates  08/18/2012  . Prolia [denosumab]  06/21/2013    Family History  Problem Relation Age of Onset  . Heart disease Mother   . Renal Disease Mother   . Heart failure Mother   . Colon cancer Neg Hx   . Breast cancer Neg Hx     Social History   Socioeconomic History  . Marital status: Widowed    Spouse name: Not on file  . Number of children: Not on file  . Years of education: Not on file  . Highest education level: Not on file  Occupational History  . Occupation: Retired    Comment: Community education officer  Social Needs  . Financial resource strain: Not on file  . Food insecurity:    Worry: Not on file    Inability: Not on file  . Transportation needs:    Medical: Not on file    Non-medical: Not on file  Tobacco Use  . Smoking status: Former Smoker    Types: Cigarettes  Last attempt to quit: 01/19/1991    Years since quitting: 26.2  . Smokeless tobacco: Never Used  Substance and Sexual Activity  . Alcohol use: No  . Drug use: No  . Sexual activity: Never  Lifestyle  . Physical activity:    Days per week: Not on file    Minutes per session: Not on file  . Stress: Not on file  Relationships  . Social connections:    Talks on phone: Not on file    Gets together: Not on file    Attends religious service: Not on file    Active member of club or organization: Not on file    Attends meetings of clubs or organizations: Not on file    Relationship status: Not on file  . Intimate partner violence:    Fear of current or ex partner:  Not on file    Emotionally abused: Not on file    Physically abused: Not on file    Forced sexual activity: Not on file  Other Topics Concern  . Not on file  Social History Narrative   Education:  1 year Business School   Retired back to [employer] after working in [employer], [employer]   Widowed '93   2 kids    Review of Systems: See HPI, otherwise negative ROS  Physical Exam: BP (!) 145/85   Pulse 70   Temp 97.9 F (36.6 C) (Tympanic)   Resp 20   Ht 4\' 9"  (1.448 m)   Wt 165 lb (74.8 kg)   SpO2 99%   BMI 35.71 kg/m  General:   Alert,  pleasant and cooperative in NAD Head:  Normocephalic and atraumatic. Neck:  Supple; no masses or thyromegaly. Lungs:  Clear throughout to auscultation, normal respiratory effort.    Heart:  +S1, +S2, Regular rate and rhythm, No edema. Abdomen:  Soft, nontender and nondistended. Normal bowel sounds, without guarding, and without rebound.   Neurologic:  Alert and  oriented x4;  grossly normal neurologically.  Impression/Plan: Patricia Hoover is here for an EGD for duodenal ulcer.  Risks, benefits, limitations, and alternatives regarding the procedure have been reviewed with the patient.  Questions have been answered.  All parties agreeable.   Virgel Manifold, MD  04/08/2017, 8:06 AM

## 2017-04-08 NOTE — Transfer of Care (Signed)
Immediate Anesthesia Transfer of Care Note  Patient: Patricia Hoover  Procedure(s) Performed: Procedure(s): ESOPHAGOGASTRODUODENOSCOPY (EGD) WITH PROPOFOL (N/A)  Patient Location: PACU and Endoscopy Unit  Anesthesia Type:General  Level of Consciousness: sedated  Airway & Oxygen Therapy: Patient Spontanous Breathing and Patient connected to nasal cannula oxygen  Post-op Assessment: Report given to RN and Post -op Vital signs reviewed and stable  Post vital signs: Reviewed and stable  Last Vitals:  Vitals:   04/08/17 0830 04/08/17 0838  BP: 133/78 133/78  Pulse: 81 78  Resp: 20 20  Temp: (!) 36.1 C (!) 36.1 C  SpO2: 10% 62%    Complications: No apparent anesthesia complications

## 2017-04-08 NOTE — Anesthesia Procedure Notes (Signed)
Date/Time: 04/08/2017 8:07 AM Performed by: Doreen Salvage, CRNA Pre-anesthesia Checklist: Patient identified, Emergency Drugs available, Suction available and Patient being monitored Patient Re-evaluated:Patient Re-evaluated prior to induction Oxygen Delivery Method: Nasal cannula Induction Type: IV induction Dental Injury: Teeth and Oropharynx as per pre-operative assessment  Comments: Nasal cannula with etCO2 monitoring

## 2017-04-08 NOTE — Anesthesia Post-op Follow-up Note (Signed)
Anesthesia QCDR form completed.        

## 2017-04-08 NOTE — Anesthesia Preprocedure Evaluation (Signed)
Anesthesia Evaluation  Patient identified by MRN, date of birth, ID band Patient awake    Reviewed: Allergy & Precautions, NPO status , Patient's Chart, lab work & pertinent test results  History of Anesthesia Complications Negative for: history of anesthetic complications  Airway Mallampati: II  TM Distance: >3 FB Neck ROM: Full    Dental no notable dental hx.    Pulmonary neg sleep apnea, neg COPD, former smoker,    breath sounds clear to auscultation- rhonchi (-) wheezing      Cardiovascular hypertension, Pt. on medications (-) CAD, (-) Past MI, (-) Cardiac Stents and (-) CABG  Rhythm:Regular Rate:Normal - Systolic murmurs and - Diastolic murmurs    Neuro/Psych negative neurological ROS  negative psych ROS   GI/Hepatic Neg liver ROS, GIB, hx of diverticulosis    Endo/Other  negative endocrine ROSneg diabetes  Renal/GU Renal disease: hx of nephrolithiasis.     Musculoskeletal negative musculoskeletal ROS (+)   Abdominal (+) + obese,   Peds  Hematology negative hematology ROS (+)   Anesthesia Other Findings Past Medical History: No date: Arrhythmia     Comment:  possible hx, resolved prev 1972: Blood transfusion without reported diagnosis     Comment:  had reaction; had to stop No date: Carpal tunnel syndrome 2012: H/O exercise stress test     Comment:  normal No date: Heart murmur     Comment:  as child No date: High cholesterol No date: History of kidney stones No date: Hx of colonic polyps No date: Hypertension 2010: Osteoporosis     Comment:  t score - 3.9 No date: Renal stones   Reproductive/Obstetrics                             Anesthesia Physical  Anesthesia Plan  ASA: II  Anesthesia Plan: General   Post-op Pain Management:    Induction: Intravenous  PONV Risk Score and Plan: 2 and Propofol infusion  Airway Management Planned: Natural Airway and Nasal  Cannula  Additional Equipment:   Intra-op Plan:   Post-operative Plan:   Informed Consent: I have reviewed the patients History and Physical, chart, labs and discussed the procedure including the risks, benefits and alternatives for the proposed anesthesia with the patient or authorized representative who has indicated his/her understanding and acceptance.   Dental advisory given  Plan Discussed with: CRNA and Anesthesiologist  Anesthesia Plan Comments:         Anesthesia Quick Evaluation

## 2017-04-08 NOTE — Op Note (Signed)
[facility] Gastroenterology Patient Name: Patricia Hoover Procedure Date: 04/08/2017 7:59 AM MRN: [id number] Account #: [id number] Date of Birth: 1945/10/14 Admit Type: Outpatient Age: 72 Room: [facility] ENDO ROOM 2 Gender: Female Note Status: Finalized Procedure:            Upper GI endoscopy Indications:          Acute duodenal ulcer, Feb 2019 EGD showed small                        duodenal bulb ulcer that did not allow passage of scope                        into the second portion. Pt did not have any                        obstructive symptoms at the time. Her H Pylori was                        positive at the time as well. She is s/p triple therapy. Providers:            Lennette Bihari. Bonna Gains MD, MD Referring MD:         Elveria Rising. Damita Dunnings, MD (Referring MD) Medicines:            Monitored Anesthesia Care Complications:        No immediate complications. Procedure:            Pre-Anesthesia Assessment:                       - Prior to the procedure, a History and Physical was                        performed, and patient medications, allergies and                        sensitivities were reviewed. The patient's tolerance of                        previous anesthesia was reviewed.                       - The risks and benefits of the procedure and the                        sedation options and risks were discussed with the                        patient. All questions were answered and informed                        consent was obtained.                       - Patient identification and proposed procedure were                        verified prior to the procedure by the physician, the                        nurse, the anesthesiologist, the  anesthetist and the                        technician. The procedure was verified in the procedure                        room.                       - ASA Grade Assessment: II - A patient with mild   systemic disease.                       After obtaining informed consent, the endoscope was                        passed under direct vision. Throughout the procedure,                        the patient's blood pressure, pulse, and oxygen                        saturations were monitored continuously. The Endoscope                        was introduced through the mouth, and advanced to the                        second part of duodenum. The upper GI endoscopy was                        accomplished with ease. The patient tolerated the                        procedure well. Findings:      The examined esophagus was normal.      The Z-line was irregular and was found 38 cm from the incisors. As per       recent guidelines, an irregular Z line does not need biopsies, unless       greater than 1 cm tongues of salmon colored tongues are seen, which is       not present here.      Patchy mildly erythematous mucosa without bleeding was found in the       gastric antrum. Biopsies were taken with a cold forceps for histology.       Biopsies were obtained in the gastric body, at the incisura and in the       gastric antrum with cold forceps for histology.      A mild post-ulcer deformity was found in the duodenal bulb. The       deformity was characterized by mild angulation at the area which was       able to be easily traversed with the endoscope. No other deformities       present. Biopsies were taken with a cold forceps for histology.      There is no endoscopic evidence of ulceration in the entire examined       duodenum.      The duodenal bulb, second portion of the duodenum and examined duodenum       were normal othwerwise. Impression:           - Normal esophagus.                       -  Z-line irregular, 38 cm from the incisors.                       - Erythematous mucosa in the antrum. Biopsied.                       - Duodenal deformity (characterized by mild angulation                         at healed ulcer site). Biopsied.                       - Normal duodenal bulb, second portion of the duodenum                        and examined duodenum.                       - Biopsies were obtained in the gastric body, at the                        incisura and in the gastric antrum. Recommendation:       - Await pathology results.                       - Discharge patient to home (with escort).                       - Advance diet as tolerated.                       - Continue present medications.                       - Patient has a contact number available for                        emergencies. The signs and symptoms of potential                        delayed complications were discussed with the patient.                        Return to normal activities tomorrow. Written discharge                        instructions were provided to the patient.                       - Discharge patient to home (with escort).                       - The findings and recommendations were discussed with                        the patient.                       - The findings and recommendations were discussed with                        the patient's family.                       -  Return to my office in 4 weeks. Procedure Code(s):    --- Professional ---                       [phone], Esophagogastroduodenoscopy, flexible, transoral;                        with biopsy, single or multiple Diagnosis Code(s):    --- Professional ---                       K22.8, Other specified diseases of esophagus                       K31.89, Other diseases of stomach and duodenum                       K26.3, Acute duodenal ulcer without hemorrhage or                        perforation CPT copyright 2016 American Medical Association. All rights reserved. The codes documented in this report are preliminary and upon coder review may  be revised to meet current compliance requirements.  [name], MD Margretta Sidle B. Bonna Gains MD, MD 04/08/2017 8:39:22 AM This report has been signed electronically. Number of Addenda: 0 Note Initiated On: 04/08/2017 7:59 AM      [facility]

## 2017-04-11 ENCOUNTER — Encounter: Payer: Self-pay | Admitting: Gastroenterology

## 2017-04-11 LAB — SURGICAL PATHOLOGY

## 2017-04-26 DIAGNOSIS — H2511 Age-related nuclear cataract, right eye: Secondary | ICD-10-CM | POA: Diagnosis not present

## 2017-04-26 DIAGNOSIS — H25013 Cortical age-related cataract, bilateral: Secondary | ICD-10-CM | POA: Diagnosis not present

## 2017-04-26 DIAGNOSIS — H2513 Age-related nuclear cataract, bilateral: Secondary | ICD-10-CM | POA: Diagnosis not present

## 2017-04-26 DIAGNOSIS — H18413 Arcus senilis, bilateral: Secondary | ICD-10-CM | POA: Diagnosis not present

## 2017-04-26 DIAGNOSIS — H25043 Posterior subcapsular polar age-related cataract, bilateral: Secondary | ICD-10-CM | POA: Diagnosis not present

## 2017-05-19 ENCOUNTER — Telehealth: Payer: Self-pay | Admitting: Family Medicine

## 2017-05-19 MED ORDER — LISINOPRIL-HYDROCHLOROTHIAZIDE 10-12.5 MG PO TABS: 1.0000 | ORAL_TABLET | Freq: Every day | ORAL | 0 refills | Status: DC

## 2017-05-19 NOTE — Telephone Encounter (Signed)
Copied from [place] [phone]. Topic: Quick Communication - See Telephone Encounter >> May 19, 2017  9:26 AM Ahmed Prima L wrote: CRM for notification. See Telephone encounter for: 05/19/17.  Patient states that [facility] have lisinopril-hydrochlorothiazide (PRINZIDE,ZESTORETIC) 10-12.5 MG tablet in stock. She said does she need to switch to get something else or is just not available period? Please advise  She said she has about 3 weeks left of pills but was trying to see what she needed to do for when she runs out.

## 2017-05-19 NOTE — Telephone Encounter (Signed)
I spoke with Patricia Hoover at [facility] and [address]; they have lisinopril - HCTZ 10-12.5 mg in stock. Pt said to send rx there and she will pick up this afternoon; pt voiced understanding and [facility]. Advised pt done. Pt going out of town early AM on 05/20/17.

## 2017-05-27 NOTE — Telephone Encounter (Signed)
I spoke with [facility] on [address] and pt did receive correct lisinopril HCTZ 10-12.5 mg; they have a different manufacturer and the pill is hexagonal and blue. Pt notified and voiced understanding.

## 2017-05-27 NOTE — Telephone Encounter (Signed)
Pt states a different BP medication was called in. She states hers use to be a pink bill and now it is blue. She is wanting to verify it is the right one to take.

## 2017-06-06 DIAGNOSIS — H2511 Age-related nuclear cataract, right eye: Secondary | ICD-10-CM | POA: Diagnosis not present

## 2017-06-12 DIAGNOSIS — T783XXA Angioneurotic edema, initial encounter: Secondary | ICD-10-CM | POA: Diagnosis not present

## 2017-06-12 DIAGNOSIS — T7840XA Allergy, unspecified, initial encounter: Secondary | ICD-10-CM | POA: Diagnosis not present

## 2017-06-13 ENCOUNTER — Encounter: Payer: Self-pay | Admitting: Emergency Medicine

## 2017-06-13 ENCOUNTER — Emergency Department
Admission: EM | Admit: 2017-06-13 | Discharge: 2017-06-13 | Disposition: A | Discharge status: Home / Self Care | Payer: Medicare Other | Attending: Emergency Medicine | Admitting: Emergency Medicine

## 2017-06-13 DIAGNOSIS — Z87891 Personal history of nicotine dependence: Secondary | ICD-10-CM | POA: Diagnosis not present

## 2017-06-13 DIAGNOSIS — T783XXA Angioneurotic edema, initial encounter: Secondary | ICD-10-CM | POA: Diagnosis not present

## 2017-06-13 DIAGNOSIS — I10 Essential (primary) hypertension: Secondary | ICD-10-CM | POA: Insufficient documentation

## 2017-06-13 DIAGNOSIS — T7840XA Allergy, unspecified, initial encounter: Secondary | ICD-10-CM

## 2017-06-13 DIAGNOSIS — Z79899 Other long term (current) drug therapy: Secondary | ICD-10-CM | POA: Insufficient documentation

## 2017-06-13 DIAGNOSIS — R220 Localized swelling, mass and lump, head: Secondary | ICD-10-CM | POA: Diagnosis present

## 2017-06-13 MED ORDER — METHYLPREDNISOLONE SODIUM SUCC 125 MG IJ SOLR
INTRAMUSCULAR | Status: AC
  Filled 2017-06-13: qty 2

## 2017-06-13 MED ORDER — FAMOTIDINE IN NACL 20-0.9 MG/50ML-% IV SOLN
20.0000 mg | Freq: Once | INTRAVENOUS | Status: AC
  Administered 2017-06-13: 20 mg via INTRAVENOUS

## 2017-06-13 MED ORDER — DIPHENHYDRAMINE HCL 50 MG/ML IJ SOLN
25.0000 mg | Freq: Once | INTRAMUSCULAR | Status: AC
  Administered 2017-06-13: 25 mg via INTRAVENOUS
  Filled 2017-06-13: qty 1

## 2017-06-13 MED ORDER — DIPHENHYDRAMINE HCL 50 MG/ML IJ SOLN
INTRAMUSCULAR | Status: AC
  Filled 2017-06-13: qty 1

## 2017-06-13 MED ORDER — EPINEPHRINE 0.3 MG/0.3ML IJ SOAJ
0.3000 mg | Freq: Once | INTRAMUSCULAR | Status: AC
Start: 2017-06-13 — End: 2017-06-13
  Administered 2017-06-13: 0.3 mg via INTRAMUSCULAR

## 2017-06-13 MED ORDER — METHYLPREDNISOLONE SODIUM SUCC 125 MG IJ SOLR
125.0000 mg | Freq: Once | INTRAMUSCULAR | Status: AC
  Administered 2017-06-13: 125 mg via INTRAVENOUS

## 2017-06-13 MED ORDER — PREDNISONE 20 MG PO TABS: ORAL_TABLET | ORAL | 0 refills | Status: DC

## 2017-06-13 MED ORDER — FAMOTIDINE 20 MG PO TABS: 20.0000 mg | ORAL_TABLET | Freq: Two times a day (BID) | ORAL | 0 refills | Status: DC

## 2017-06-13 MED ORDER — EPINEPHRINE 0.3 MG/0.3ML IJ SOAJ: 0.3000 mg | Freq: Once | INTRAMUSCULAR | 0 refills | Status: AC

## 2017-06-13 MED ORDER — EPINEPHRINE 0.3 MG/0.3ML IJ SOAJ
INTRAMUSCULAR | Status: AC
  Filled 2017-06-13: qty 0.3

## 2017-06-13 MED ORDER — DIPHENHYDRAMINE HCL 50 MG/ML IJ SOLN
25.0000 mg | Freq: Once | INTRAMUSCULAR | Status: AC
  Administered 2017-06-13: 25 mg via INTRAVENOUS

## 2017-06-13 MED ORDER — FAMOTIDINE IN NACL 20-0.9 MG/50ML-% IV SOLN
INTRAVENOUS | Status: AC
  Filled 2017-06-13: qty 50

## 2017-06-13 NOTE — ED Provider Notes (Signed)
-----------------------------------------   8:45 AM on 06/13/2017 -----------------------------------------  I took over care on this patient from Dr. Beather Arbour.  Plan was to observe until 8:30 AM for worsening of symptoms.  At this time, the patient states that the swelling has continually improved.  She appears comfortable.  She states she feels well to go home.  Prescriptions have been provided by Dr. Beather Arbour.  The patient knows that she must stop her ACE inhibitor.  Return precautions given, and she expresses understanding.   Arta Silence, MD 06/13/17 [phone]

## 2017-06-13 NOTE — ED Notes (Signed)
Pt with swelling noted to upper lip but no edema to tongue at this time.

## 2017-06-13 NOTE — ED Notes (Signed)
Pt talking clearly. No signs of respiratory distress or tongue swelling. Upper lip swelling across entire lip.

## 2017-06-13 NOTE — ED Notes (Signed)
Pt still with no increased swelling. Pt resting quietly in room with NAD.

## 2017-06-13 NOTE — ED Triage Notes (Signed)
Patient with top lip swelling that started about 21:00 last night. Patient states that it has become worse throughout the night. Patient states she has had a similar reaction in the past with lisinopril. Patient states that she still takes lisinopril.

## 2017-06-13 NOTE — ED Provider Notes (Addendum)
[facility] Emergency Department Provider Note   ____________________________________________   First MD Initiated Contact with Patient 06/13/17 [phone]     (approximate)  I have reviewed the triage vital signs and the nursing notes.   HISTORY  Chief Complaint Oral Swelling and Allergic Reaction    HPI Patricia Hoover is a 72 y.o. female who presents to the ED from home with a chief complaint of lip swelling.  Patient reports left upper lip swelling which began approximately 9 PM last night.  States it got worse during the night which is why she came.  She was trying to wait until her doctor's office open this morning to get a shot.  Similar episode occurred last year where she was seen at her doctor's clinic, given a shot and discharged home.  Takes lisinopril.  After the episode last year she was not taken off lisinopril so she still takes it.  Denies difficulty breathing, drooling, chest pain, abdominal pain, nausea or vomiting.   Past Medical History:  Diagnosis Date  . Arrhythmia    possible hx, resolved prev  . Blood transfusion without reported diagnosis 1972   had reaction; had to stop  . Carpal tunnel syndrome   . Duodenal ulcer   . H/O exercise stress test 2012   normal  . Heart murmur    as child  . High cholesterol   . History of kidney stones   . Hx of colonic polyps   . Hypertension   . Osteoporosis 2010   t score - 3.9  . Renal stones     Patient Active Problem List   Diagnosis Date Noted  . Peptic ulcer of duodenum   . Other specified diseases of esophagus   . Hematochezia   . External hemorrhoids   . Diverticulosis of large intestine without diverticulitis   . Internal hemorrhoids   . Duodenal ulceration   . Stomach irritation   . GIB (gastrointestinal bleeding) 02/28/2017  . Abnormal stools 02/27/2017  . Advance care planning 12/23/2016  . Kidney stone 04/20/2016  . Heartburn 12/17/2015  . Medicare annual wellness visit,  subsequent 06/05/2013  . Hyperglycemia 06/05/2013  . Knee pain 08/31/2012  . Osteoporosis 08/18/2012  . HTN (hypertension) 08/09/2012  . HLD (hyperlipidemia) 08/09/2012    Past Surgical History:  Procedure Laterality Date  . ABDOMINAL HYSTERECTOMY  1972   for endometriosis  . COLONOSCOPY Left 03/02/2017   Procedure: COLONOSCOPY;  Surgeon: Virgel Manifold, MD;  Location: [facility] ENDOSCOPY;  Service: Endoscopy;  Laterality: Left;  . COLONOSCOPY WITH PROPOFOL N/A 05/04/2016   Procedure: COLONOSCOPY WITH PROPOFOL;  Surgeon: Jonathon Bellows, MD;  Location: [facility] ENDOSCOPY;  Service: Endoscopy;  Laterality: N/A;  . ESOPHAGOGASTRODUODENOSCOPY Left 03/02/2017   Procedure: ESOPHAGOGASTRODUODENOSCOPY (EGD);  Surgeon: Virgel Manifold, MD;  Location[facility] ENDOSCOPY;  Service: Endoscopy;  Laterality: Left;  . ESOPHAGOGASTRODUODENOSCOPY (EGD) WITH PROPOFOL N/A 04/08/2017   Procedure: ESOPHAGOGASTRODUODENOSCOPY (EGD) WITH PROPOFOL;  Surgeon: Virgel Manifold, MD;  Location: [facility] ENDOSCOPY;  Service: Endoscopy;  Laterality: N/A;  . LITHOTRIPSY     for kidney stone x5    Prior to Admission medications   Medication Sig Start Date End Date Taking? Authorizing Provider  cholecalciferol (VITAMIN D) 1000 units tablet Take 2 tablets (2,000 Units total) by mouth daily. 12/16/15   Tonia Ghent, MD  EPINEPHrine 0.3 mg/0.3 mL IJ SOAJ injection Inject 0.3 mLs (0.3 mg total) into the muscle once for 1 dose. 06/13/17 06/13/17  Paulette Blanch, MD  famotidine (PEPCID) 20 MG tablet Take 1 tablet (20 mg total) by mouth 2 (two) times daily. 06/13/17   Paulette Blanch, MD  lisinopril-hydrochlorothiazide (PRINZIDE,ZESTORETIC) 10-12.5 MG tablet Take 1 tablet by mouth daily. 05/19/17   Tonia Ghent, MD  pantoprazole (PROTONIX) 40 MG tablet Take 1 tablet (40 mg total) by mouth 2 (two) times daily before a meal. 03/02/17   Demetrios Loll, MD  predniSONE (DELTASONE) 20 MG tablet 3 tablets daily x4 days 06/13/17   Paulette Blanch, MD    shark liver oil-cocoa butter (PREPARATION H) 0.25-3-85.5 % suppository Place 1 suppository rectally as needed for hemorrhoids.    [provider]  simvastatin (ZOCOR) 10 MG tablet Take 1 tablet (10 mg total) by mouth daily. 12/20/16   Tonia Ghent, MD    Allergies Apple; Aspirin; Bisphosphonates; Nsaids; and Prolia [denosumab]  Family History  Problem Relation Age of Onset  . Heart disease Mother   . Renal Disease Mother   . Heart failure Mother   . Colon cancer Neg Hx   . Breast cancer Neg Hx     Social History Social History   Tobacco Use  . Smoking status: Former Smoker    Types: Cigarettes    Last attempt to quit: 01/19/1991    Years since quitting: 26.4  . Smokeless tobacco: Never Used  Substance Use Topics  . Alcohol use: No  . Drug use: No    Review of Systems  Constitutional: No fever/chills Eyes: No visual changes. ENT: Positive for left upper lip swelling.  No sore throat. Cardiovascular: Denies chest pain. Respiratory: Denies shortness of breath. Gastrointestinal: No abdominal pain.  No nausea, no vomiting.  No diarrhea.  No constipation. Genitourinary: Negative for dysuria. Musculoskeletal: Negative for back pain. Skin: Negative for rash. Neurological: Negative for headaches, focal weakness or numbness.   ____________________________________________   PHYSICAL EXAM:  VITAL SIGNS: ED Triage Vitals [06/13/17 0420]  Enc Vitals Group     BP (!) 165/82     Pulse Rate 69     Resp 18     Temp 98.2 F (36.8 C)     Temp Source Oral     SpO2 96 %     Weight 168 lb (76.2 kg)     Height 4' 9.5" (1.461 m)     Head Circumference      Peak Flow      Pain Score 0     Pain Loc      Pain Edu?      Excl. in [place]?     Constitutional: Alert and oriented. Well appearing and in no acute distress.  Jovial and talkative. Eyes: Conjunctivae are normal. PERRL. EOMI. Head: Atraumatic. Nose: No congestion/rhinnorhea. Mouth/Throat: Mucous membranes are  moist.  Left upper lip mildly swollen.  There is no tongue swelling.  There is no hoarse or muffled voice.  There is no drooling.  Patient is tolerating secretions well. Neck: No stridor.  Soft submental space. Cardiovascular: Normal rate, regular rhythm. Grossly normal heart sounds.  Good peripheral circulation. Respiratory: Normal respiratory effort.  No retractions. Lungs CTAB. Gastrointestinal: Soft and nontender. No distention. No abdominal bruits. No CVA tenderness. Musculoskeletal: No lower extremity tenderness nor edema.  No joint effusions. Neurologic:  Normal speech and language. No gross focal neurologic deficits are appreciated. No gait instability. Skin:  Skin is warm, dry and intact. No rash noted. Psychiatric: Mood and affect are normal. Speech and behavior are normal.  ____________________________________________   LABS (all  labs ordered are listed, but only abnormal results are displayed)  Labs Reviewed - No data to display ____________________________________________  EKG  None ____________________________________________  RADIOLOGY  ED MD interpretation: None  Official radiology report(s): No results found.  ____________________________________________   PROCEDURES  Procedure(s) performed: None  Procedures  Critical Care performed:  CRITICAL CARE Performed by: Paulette Blanch   Total critical care time: 45 minutes  Critical care time was exclusive of separately billable procedures and treating other patients.  Critical care was necessary to treat or prevent imminent or life-threatening deterioration.  Critical care was time spent personally by me on the following activities: development of treatment plan with patient and/or surrogate as well as nursing, discussions with consultants, evaluation of patient's response to treatment, examination of patient, obtaining history from patient or surrogate, ordering and performing treatments and interventions,  ordering and review of laboratory studies, ordering and review of radiographic studies, pulse oximetry and re-evaluation of patient's condition.  ____________________________________________   INITIAL IMPRESSION / ASSESSMENT AND PLAN / ED COURSE  As part of my medical decision making, I reviewed the following data within the [place] notes reviewed and incorporated, Old chart reviewed and Notes from prior ED visits   72 year old female on lisinopril who presents with left upper lip swelling since 9 PM.  Room air saturations 96%; there is no airway distress currently.  Will administer IM epi, IV Benadryl, Solu-Medrol, Pepcid and observe.  Will discontinue lisinopril.   Clinical Course as of Jun 14 706  Mon Jun 13, 2017  0626 Swelling to left upper lip has now layered to the right side.  There has been no significant increase from prior.  There is no tongue swelling.  Tolerating secretions well.  Room air saturations 99%.  Will give additional 25 mg IV Benadryl.  Ice provided to patient to apply to upper lip every few minutes to reduce swelling.   [JS]  0705 Patient resting no acute distress.  Plan to monitor her until 830am at which time we will have been 4 hours since she received her medicines.  Anticipate discharge home if there is no significant worsening.  Instructed her to discontinue lisinopril as this is the most likely culprit of her swelling.  Will discharge her home on EpiPen as needed, prednisone, Pepcid.  At this time care is transferred to Dr. Cherylann Banas pending reassessment and disposition.   [JS]    Clinical Course User Index [JS] Paulette Blanch, MD     ____________________________________________   FINAL CLINICAL IMPRESSION(S) / ED DIAGNOSES  Final diagnoses:  Allergic reaction, initial encounter  Angioedema, initial encounter     ED Discharge Orders        Ordered    predniSONE (DELTASONE) 20 MG tablet     06/13/17 0658    EPINEPHrine  0.3 mg/0.3 mL IJ SOAJ injection   Once     06/13/17 0658    famotidine (PEPCID) 20 MG tablet  2 times daily     06/13/17 8546       Note:  This document was prepared using Dragon voice recognition software and may include unintentional dictation errors.    Paulette Blanch, MD 06/13/17 2703    Paulette Blanch, MD 06/23/17 [phone]

## 2017-06-13 NOTE — Discharge Instructions (Signed)
1.  Stop taking lisinopril as this caused your lip swelling tonight.  Talk to your doctor about taking a different blood pressure medicine. 2.  Take steroid as prescribed (prednisone 60 mg daily x4 days).  Start your next dose Tuesday morning. 3.  Take Pepcid 20 mg twice daily x4 days. 4.  Take Benadryl every 6 hours for the next 2 days.  This will control your lip swelling. 5.  Return to the ER for worsening symptoms, persistent vomiting, difficulty breathing or other concerns.

## 2017-06-14 ENCOUNTER — Ambulatory Visit (INDEPENDENT_AMBULATORY_CARE_PROVIDER_SITE_OTHER): Payer: Medicare Other | Admitting: Family Medicine

## 2017-06-14 ENCOUNTER — Telehealth: Payer: Self-pay

## 2017-06-14 ENCOUNTER — Other Ambulatory Visit: Payer: Self-pay | Admitting: Family Medicine

## 2017-06-14 ENCOUNTER — Encounter: Payer: Self-pay | Admitting: Family Medicine

## 2017-06-14 DIAGNOSIS — T783XXD Angioneurotic edema, subsequent encounter: Secondary | ICD-10-CM | POA: Diagnosis not present

## 2017-06-14 MED ORDER — AMLODIPINE BESYLATE 2.5 MG PO TABS: 2.5000 mg | ORAL_TABLET | Freq: Every day | ORAL | 3 refills | Status: DC

## 2017-06-14 NOTE — Patient Instructions (Addendum)
This was presumed to be from the lisinopril.  Don't take it again.   Don't take ACE or ARB medicines.  If anyone asks about med allergies, tell them you had lip swelling on BP medicine.  If you have elevated BP >140/>90, then start taking 2.5mg  amlodipine and update me as needed.   Take care.  Glad to see you.

## 2017-06-14 NOTE — Telephone Encounter (Signed)
PLEASE NOTE: All timestamps contained within this report are represented as [place] Standard Time. CONFIDENTIALTY NOTICE: This fax transmission is intended only for the addressee. It contains information that is legally privileged, confidential or otherwise protected from use or disclosure. If you are not the intended recipient, you are strictly prohibited from reviewing, disclosing, copying using or disseminating any of this information or taking any action in reliance on or regarding this information. If you have received this fax in error, please notify us immediately by telephone so that we can arrange for its return to [place]. Phone: [phone], Toll-Free: [phone], Fax: [phone] Page: 1 of 2 Call Id: [id number] [place] Patient Name: Patricia Hoover Gender: Female DOB: May 19, 1945 Age: 32 Y 45 M 2 D Return Phone Number: [phone] (Primary), [phone] (Secondary) Address: City/State/Zip: [place] [address] Client [facility] Night - Client Client Site [place] [facility] - PHYSICIAN, NOT LISTED- MD Contact Type Call Who Is Calling Patient / Member / Family / Caregiver Call Type Triage / Clinical Relationship To Patient Self Return Phone Number [phone] (Primary) Chief Complaint Allergic reaction (unknown symptoms) Reason for Call Symptomatic / Request for Health Information Initial Comment Caller states her lip is swelling. Last time it happened she was taking lisinopril. Has a new Designer, fashion/clothing. Translation No Nurse Assessment Nurse: Yolanda Bonine. RN, Sunday Spillers Date/Time Eilene Ghazi Time): 06/13/2017 3:36:09 AM Confirm and document reason for call. If symptomatic, describe symptoms. ---Caller states her lip is swelling. Started about 9 pm last night. Took lisinopril yesterday. Does the patient have any new or worsening symptoms?  ---Yes Will a triage be completed? ---Yes Related visit to physician within the last 2 weeks? ---No Does the PT have any chronic conditions? (i.e. diabetes, asthma, etc.) ---Yes List chronic conditions. ---Htn, cataracts, Is this a behavioral health or substance abuse call? ---No Guidelines Guideline Title Affirmed Question Affirmed Notes Nurse Date/Time Eilene Ghazi Time) Lip Swelling Taking an ACE Inhibitor medication (e.g., benazepril/LOTENSIN, captopril/CAPOTEN, enalapril/VASOTEC, lisinopril/ZESTRIL) Yolanda Bonine. RN, Sunday Spillers 06/13/2017 3:37:49 AM Disp. Time Eilene Ghazi Time) Disposition Final User 06/13/2017 3:39:56 AM Go to ED Now Yes Yolanda Bonine. RN, Sunday Spillers PLEASE NOTE: All timestamps contained within this report are represented as [place] Standard Time. CONFIDENTIALTY NOTICE: This fax transmission is intended only for the addressee. It contains information that is legally privileged, confidential or otherwise protected from use or disclosure. If you are not the intended recipient, you are strictly prohibited from reviewing, disclosing, copying using or disseminating any of this information or taking any action in reliance on or regarding this information. If you have received this fax in error, please notify us immediately by telephone so that we can arrange for its return to [place]. Phone: [phone], Toll-Free: [phone], Fax: [phone] Page: 2 of 2 Call Id: [id number] [place] Disagree/Comply Comply Caller Understands Yes PreDisposition Call Doctor Care Advice Given Per Guideline GO TO ED NOW: You need to be seen in the Emergency Department. Go to the ER at ___________ [place] now. Drive carefully. BRING MEDICINES: * Please bring a list of your current medicines when you go to see the doctor. CARE ADVICE given per Lip Swelling (Adult) guideline. Referrals [facility] - ED

## 2017-06-14 NOTE — Progress Notes (Signed)
ER f/u.  We talked about her situation.  She had prev lip swelling that was associated with an ulcer.  This was in 2016.  She didn't have troubles o/w until recently.  She had progressive lip swelling.  Until recently she was on lisinopril.  She went to the emergency room.  She was diagnosed with likely angioedema.  She was treated appropriately in the emergency room.  Emergency room course discussed with patient.  She was discharged home.  In the meantime, she stopped lisinopril. She isn't on prednisone or pepcid yet.  She is clearly improved in the meantime.  The lip swelling is gone back down.  She has no tongue or lip changes otherwise.  She has no stridor.  No wheezing.  She feels well.  She got an epipen in the meantime.    PMH and SH reviewed  ROS: Per HPI unless specifically indicated in ROS section   Meds, vitals, and allergies reviewed.   GEN: nad, alert and oriented HEENT: mucous membranes moist, no lip or tongue edema. NECK: supple w/o LA, no stridor CV: rrr PULM: ctab, no inc wob, no wheeze.  ABD: soft, +bs EXT: no edema SKIN: no acute rash  She incidentally has a right third finger DIP extensor cyst noted.  Discussed with patient that we can refer her to orthopedic/hand clinic if she wants to get this treated.  It is not particularly bothersome to the patient otherwise now.

## 2017-06-14 NOTE — Telephone Encounter (Signed)
Pt was seen 06/13/17 at [facility] ED; pt has 15' appt today at 3:15 with Dr Damita Dunnings.

## 2017-06-15 DIAGNOSIS — T783XXA Angioneurotic edema, initial encounter: Secondary | ICD-10-CM

## 2017-06-15 HISTORY — DX: Angioneurotic edema, initial encounter: T78.3XXA

## 2017-06-15 NOTE — Assessment & Plan Note (Signed)
We talked about options and her situation.  It is less likely that the episode from years ago was true angioedema since she had an aphthous ulcer noted at the time.  She also continued on ACE inhibitor in the meantime without any trouble until recently.  It is likely reasonable to presume that the most recent episode was angioedema related to ACE inhibitor use.  Even if she had angioedema related to something other than the ACE inhibitor, it still makes sense to stop the lisinopril.  Advised her not to use ACE inhibitors, not to use ARB's.  Routine cautions given to patient.  Symptoms are resolved and she is not on prednisone or Pepcid so she would not have to start those now.  Routine emergency room cautions given.  We talked about treating her blood pressure if needed.  I gave her a prescription for amlodipine 2.5 mg.  She can start taking 1 pill a day if she has persistently elevated blood pressures.  She will update me as needed.  Okay for outpatient follow-up.  All questions answered. >25 minutes spent in face to face time with patient, >50% spent in counselling or coordination of care.

## 2017-06-15 NOTE — Telephone Encounter (Signed)
See OV note.  

## 2017-06-16 ENCOUNTER — Ambulatory Visit: Payer: Medicare Other

## 2017-06-18 HISTORY — PX: CATARACT EXTRACTION W/ INTRAOCULAR LENS IMPLANT: SHX1309

## 2017-06-24 ENCOUNTER — Encounter: Payer: Self-pay | Admitting: Family Medicine

## 2017-06-24 ENCOUNTER — Ambulatory Visit (INDEPENDENT_AMBULATORY_CARE_PROVIDER_SITE_OTHER): Payer: Medicare Other | Admitting: Family Medicine

## 2017-06-24 DIAGNOSIS — I10 Essential (primary) hypertension: Secondary | ICD-10-CM | POA: Diagnosis not present

## 2017-06-24 NOTE — Patient Instructions (Signed)
Your cuff is likely reading a little high.  Your pressure here is fine.  Continue with the 2.5mg  of amlodipine.   Take care.  Glad to see you.

## 2017-06-24 NOTE — Progress Notes (Signed)
Our cuff was sig lower than her reading.  Recheck with me on our cuff was 122/75.  She is on amlodipine at baseline now.  Off lisinopril.  No chest pain.  Not short of breath.  Meds, vitals, and allergies reviewed.   ROS: Per HPI unless specifically indicated in ROS section   GEN: nad, alert and oriented HEENT: mucous membranes moist NECK: supple w/o LA CV: rrr. PULM: ctab, no inc wob ABD: soft, +bs EXT: no edema

## 2017-06-26 NOTE — Assessment & Plan Note (Signed)
Likely her cuff is reading artificially high.  Recheck multiple times on our cuff is better.Likely reasonable to continue amlodipine 2.5 mg a day.  Avoid ACE inhibitors and ARB's.  Discussed with patient.  Continue work on diet and exercise.  If she has any symptomatic hypotension then she can stop amlodipine and update me.  All questions answered.

## 2017-06-27 DIAGNOSIS — H2512 Age-related nuclear cataract, left eye: Secondary | ICD-10-CM | POA: Diagnosis not present

## 2017-07-06 ENCOUNTER — Ambulatory Visit: Payer: Medicare Other | Admitting: Gastroenterology

## 2017-07-11 ENCOUNTER — Encounter: Payer: Self-pay | Admitting: Gastroenterology

## 2017-07-11 ENCOUNTER — Ambulatory Visit (INDEPENDENT_AMBULATORY_CARE_PROVIDER_SITE_OTHER): Payer: Medicare Other | Admitting: Gastroenterology

## 2017-07-11 VITALS — BP 150/76 | HR 81 | Ht <= 58 in | Wt 171.6 lb

## 2017-07-11 DIAGNOSIS — K269 Duodenal ulcer, unspecified as acute or chronic, without hemorrhage or perforation: Secondary | ICD-10-CM

## 2017-07-11 DIAGNOSIS — K649 Unspecified hemorrhoids: Secondary | ICD-10-CM | POA: Diagnosis not present

## 2017-07-11 NOTE — Progress Notes (Signed)
Patricia Antigua, MD [address]  [place]  [place], [place] [address]  Main: [phone]  Fax: [phone]   Primary Care Physician: Tonia Ghent, MD  Primary Gastroenterologist:  Dr. Vonda Hoover  Chief Complaint  Patient presents with  . Follow-up    Duodenal bulb ulcer, H-Pylori, hemorroids    HPI: Patricia Hoover is a 72 y.o. female here for follow-up of resolved duodenal bulb ulcer and eradicated H. pylori on biopsies.  The patient denies abdominal or flank pain, anorexia, nausea or vomiting, dysphagia, change in bowel habits or black or bloody stools or weight loss. Pt. Completed PPI therapy.  States she is not on aspirin 81 mg anymore.  Reports intermittent hematochezia from her known external hemorrhoids.  Occasional about once every few weeks.  Reports feeling an external hemorrhoid.  Last EGD in March 2019 for follow-up of duodenal ulcer Findings:      The examined esophagus was normal.      The Z-line was irregular and was found 38 cm from the incisors. As per       recent guidelines, an irregular Z line does not need biopsies, unless       greater than 1 cm tongues of salmon colored tongues are seen, which is       not present here.      Patchy mildly erythematous mucosa without bleeding was found in the       gastric antrum. Biopsies were taken with a cold forceps for histology.       Biopsies were obtained in the gastric body, at the incisura and in the       gastric antrum with cold forceps for histology.      A mild post-ulcer deformity was found in the duodenal bulb. The       deformity was characterized by mild angulation at the area which was       able to be easily traversed with the endoscope. No other deformities       present. Biopsies were taken with a cold forceps for histology.      There is no endoscopic evidence of ulceration in the entire examined       duodenum.      The duodenal bulb, second portion of the duodenum and examined  duodenum       were normal othwerwise. Impression:           - Normal esophagus.                       - Z-line irregular, 38 cm from the incisors.                       - Erythematous mucosa in the antrum. Biopsied.                       - Duodenal deformity (characterized by mild angulation                        at healed ulcer site). Biopsied.                       - Normal duodenal bulb, second portion of the duodenum                        and examined duodenum.                       -  Biopsies were obtained in the gastric body, at the                        incisura and in the gastric antrum.   Surgical Pathology  CASE: [id number]  PATIENT: Patricia Hoover  Surgical Pathology Report      SPECIMEN SUBMITTED:  A. Duodenum bulb, previous ulcer site; cbx  B. Stomach, antrum, body, incisura, hx of h pylori; cbx   CLINICAL HISTORY:  None provided   PRE-OPERATIVE DIAGNOSIS:  Duodenal ulcer   POST-OPERATIVE DIAGNOSIS:  Gastric erythema, healed duodenal ulcer      DIAGNOSIS:  A. 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  - NEGATIVE FOR INTRA-EPITHELIAL LYMPHOCYTOSIS, DYSPLASIA AND MALIGNANCY.   B. STOMACH, ANTRUM, BODY AND INCISURA; COLD BIOPSY:  - ANTRAL MUCOSA WITH MILD CHRONIC GASTRITIS AND FOCAL MILD ACTIVITY.  - OXYNTIC MUCOSA WITH MILD CHRONIC GASTRITIS.  - NEGATIVE FOR H. PYLORI, DYSPLASIA AND MALIGNANCY.        Previous history: initially seen on March 01, 2017 when she was admitted for hematochezia.  She underwent EGD on March 02, 2017 that showed a 4 mm nonbleeding duodenal bulb ulcer that prevented scope advancement of the second portion.  However, patient was not have any obstructive symptoms.  Gastric mucosal atrophy was seen.  Biopsies were positive for H. pylori and.  patient was prescribed triple therapy.  Colonoscopy done March 02, 2017 showed nonbleeding diverticulosis,  internal hemorrhoids, extent of exam terminal ileum.  Her hematochezia was thought to be due to the hemorrhoids as her hemoglobin did not drop far below her baseline, and was 11.8 at its lowest.  Patient's last colonoscopy was in April 2018, and it was done to follow-up for rectal bleeding just prior to the procedure. The colonoscopy was done by Dr. Vicente Males, and showed 2 small polyps that were removed, nonbleeding internal hemorrhoids, and diverticulosis. Pathology report showed tubular adenoma and hyperplastic polyp. She had a colonoscopy in August 2016 by Dr. Elmo Putt, diverticulosis was seen, and one small polyp was removed at that time. Polyp was tubular adenoma.    Current Outpatient Medications  Medication Sig Dispense Refill  . amLODipine (NORVASC) 2.5 MG tablet Take 1 tablet (2.5 mg total) by mouth daily. 90 tablet 3  . cholecalciferol (VITAMIN D) 1000 units tablet Take 2 tablets (2,000 Units total) by mouth daily.    . simvastatin (ZOCOR) 10 MG tablet Take 1 tablet (10 mg total) by mouth daily. 90 tablet 3   No current facility-administered medications for this visit.     Allergies as of 07/11/2017 - Review Complete 07/11/2017  Allergen Reaction Noted  . Ace inhibitors Swelling 06/13/2017  . Angiotensin receptor blockers  06/14/2017  . Apple Other (See Comments) 10/25/2014  . Aspirin Other (See Comments) 03/15/2017  . Bisphosphonates  08/18/2012  . Nsaids  03/15/2017  . Prolia [denosumab]  06/21/2013    ROS:  General: Negative for anorexia, weight loss, fever, chills, fatigue, weakness. ENT: Negative for hoarseness, difficulty swallowing , nasal congestion. CV: Negative for chest pain, angina, palpitations, dyspnea on exertion, peripheral edema.  Respiratory: Negative for dyspnea at rest, dyspnea on exertion, cough, sputum, wheezing.  GI: See history of present illness. GU:  Negative for dysuria, hematuria, urinary incontinence, urinary frequency, nocturnal urination.    Endo: Negative for unusual weight change.    Physical Examination:   BP (!) 150/76 (BP Location: Left Arm, Patient Position: Sitting, Cuff  Size: Normal)   Pulse 81   Ht 4' 9.5" (1.461 m)   Wt 171 lb 9.6 oz (77.8 kg)   BMI 36.49 kg/m   General: Well-nourished, well-developed in no acute distress.  Eyes: No icterus. Conjunctivae pink. Mouth: Oropharyngeal mucosa moist and pink , no lesions erythema or exudate. Neck: Supple, Trachea midline Abdomen: Bowel sounds are normal, nontender, nondistended, no hepatosplenomegaly or masses, no abdominal bruits or hernia , no rebound or guarding.   Rectal: Moderate sized 1 external hemorrhoid, nonbleeding, digital rectal exam with yellow stool Extremities: No lower extremity edema. No clubbing or deformities. Neuro: Alert and oriented x 3.  Grossly intact. Skin: Warm and dry, no jaundice.   Psych: Alert and cooperative, normal mood and affect.   Labs: CMP     Component Value Date/Time   NA 138 03/15/2017 1047   K 3.3 (L) 03/15/2017 1047   CL 101 03/15/2017 1047   CO2 28 03/15/2017 1047   GLUCOSE 97 03/15/2017 1047   BUN 19 03/15/2017 1047   BUN 12 07/06/2011   CREATININE 0.74 03/15/2017 1047   CREATININE 0.68 01/31/2014 1652   CALCIUM 9.7 03/15/2017 1047   PROT 7.0 02/28/2017 1539   ALBUMIN 3.6 02/28/2017 1539   AST 17 02/28/2017 1539   AST 12 07/06/2011   ALT 12 (L) 02/28/2017 1539   ALKPHOS 83 02/28/2017 1539   BILITOT 0.5 02/28/2017 1539   GFRNONAA >60 03/01/2017 0156   GFRAA >60 03/01/2017 0156   Lab Results  Component Value Date   WBC 9.7 03/15/2017   HGB 13.4 03/15/2017   HCT 39.2 03/15/2017   MCV 86.5 03/15/2017   PLT 286.0 03/15/2017    Imaging Studies: No results found.  Assessment and Plan:   Baylin Cabal is a 72 y.o. y/o female here for follow-up of duodenal bulb ulcer, H. pylori, and hemorrhoids  Duodenal bulb ulcer has completely resolved Was H. pylori and NSAID induced Patient states her primary care  provider has discontinued her aspirin 81 mg daily H. pylori is eradicated with triple therapy as noted on stomach biopsies on last EGD in March 2019 Patient does not have any abdominal pain Educated to avoid NSAIDs  High-fiber diet recommended for hemorrhoids Her symptomatic hemorrhoid is her one external hemorrhoid noted on physical exam today She received steroid therapy for internal hemorrhoids during her hospital stay, and states that helps I have discussed surgery referral, but patient states her symptoms are very occasional and intermittent, and does not bother her.  They are not painful.  She does not want surgery referral at this time. If she changes her mind, and would like surgery referral for hemorrhoidectomy, she will discuss this with her primary care provider in the future  Follow-up as needed with [place] as needed  Next colonoscopy due in 5 years from 2018, see Dr. Georgeann Oppenheim colonoscopy   Dr Patricia Hoover

## 2017-07-12 ENCOUNTER — Ambulatory Visit: Payer: Medicare Other | Admitting: Gastroenterology

## 2017-07-18 HISTORY — PX: CATARACT EXTRACTION W/ INTRAOCULAR LENS IMPLANT: SHX1309

## 2017-08-04 ENCOUNTER — Encounter: Payer: Self-pay | Admitting: Family Medicine

## 2017-08-30 ENCOUNTER — Ambulatory Visit (INDEPENDENT_AMBULATORY_CARE_PROVIDER_SITE_OTHER)
Admission: RE | Admit: 2017-08-30 | Discharge: 2017-08-30 | Disposition: A | Discharge status: Home / Self Care | Payer: Medicare Other | Source: Ambulatory Visit | Attending: Family Medicine | Admitting: Family Medicine

## 2017-08-30 ENCOUNTER — Other Ambulatory Visit: Payer: Self-pay | Admitting: Family Medicine

## 2017-08-30 ENCOUNTER — Encounter: Payer: Self-pay | Admitting: Family Medicine

## 2017-08-30 ENCOUNTER — Ambulatory Visit (INDEPENDENT_AMBULATORY_CARE_PROVIDER_SITE_OTHER): Payer: Medicare Other | Admitting: Family Medicine

## 2017-08-30 ENCOUNTER — Telehealth: Payer: Self-pay | Admitting: *Deleted

## 2017-08-30 VITALS — BP 142/80 | HR 84 | Temp 98.6°F | Ht <= 58 in | Wt 167.8 lb

## 2017-08-30 DIAGNOSIS — M47812 Spondylosis without myelopathy or radiculopathy, cervical region: Secondary | ICD-10-CM | POA: Diagnosis not present

## 2017-08-30 DIAGNOSIS — M542 Cervicalgia: Secondary | ICD-10-CM

## 2017-08-30 MED ORDER — GABAPENTIN 100 MG PO CAPS: 100.0000 mg | ORAL_CAPSULE | Freq: Three times a day (TID) | ORAL | 1 refills | Status: DC | PRN

## 2017-08-30 MED ORDER — AMLODIPINE BESYLATE 2.5 MG PO TABS: 2.5000 mg | ORAL_TABLET | Freq: Every day | ORAL | 3 refills | Status: DC

## 2017-08-30 NOTE — Telephone Encounter (Addendum)
Patient states she would like her Gabapentin sent to [facility] which will need to be a 90 day supply.  Please advise how many to send.  Patient also states it requires a PA and this was submitted thru CMM, awaiting response.

## 2017-08-30 NOTE — Progress Notes (Signed)
Neck pain.  R of midline.  R trap pain.  Can radiate down to the R elbow.  No L sided sx.  Started about 3-4 days ago.  No falls, no triggers.  Grip is still at baseline. R handed. No leg sx.  No FCNAVD.  occ occipital pain.   Meds, vitals, and allergies reviewed.   ROS: Per HPI unless specifically indicated in ROS section   nad ncat Neck supple but R trap ttp No midline pain in neck No LA No rash rrr ctab Normal B shoulder ROM.  S/S wnl BUE Grip wnl B

## 2017-08-30 NOTE — Telephone Encounter (Signed)
Copied from [place] [phone] [id number]. Topic: Quick Communication - See Telephone Encounter >> Aug 30, 2017  3:43 PM Bea Graff, NT wrote: CRM for notification. See Telephone encounter for: 08/30/17. Pt states that the gabapentin (NEURONTIN) 100 MG capsule needs prior auth. She states that she would like the future refills sent to [facility]. Prior auth number [id number]. She also needs a refill of amLODipine (NORVASC) 2.5 MG tablet [facility].

## 2017-08-30 NOTE — Telephone Encounter (Signed)
Please advise the diagnosis associated with  the patient  taking Gabapentin.

## 2017-08-30 NOTE — Patient Instructions (Signed)
We will call about your referral.  Rosaria Ferries or Azalee Course will call you if you don't see one of them on the way out.  Go to the lab on the way out.  We'll contact you with your xray report.  Start taking gabapentin at night.  Gradually increase the dose as needed- sedation caution.  Take care.  Glad to see you.

## 2017-08-31 DIAGNOSIS — M542 Cervicalgia: Secondary | ICD-10-CM | POA: Insufficient documentation

## 2017-08-31 MED ORDER — GABAPENTIN 100 MG PO CAPS: 100.0000 mg | ORAL_CAPSULE | Freq: Three times a day (TID) | ORAL | 1 refills | Status: DC | PRN

## 2017-08-31 NOTE — Assessment & Plan Note (Signed)
Radicular neck pain w/o weakness and normal shoulder ROM.  Grip wnl, no weakness.  Check plain films, refer to PT. Start gabapentin.   Routine cautions d/w pt.  Still okay for outpatient f/u.  She agrees with plan.

## 2017-08-31 NOTE — Telephone Encounter (Addendum)
M54.2.   rx sent to mail order, see other note.  Thanks.

## 2017-08-31 NOTE — Telephone Encounter (Signed)
Thanks for working on the PA.   rx sent for #300, she may end up needing 6 a day initially and if needing more than that we'll have to change the sig anyway.  Thanks.

## 2017-09-01 NOTE — Telephone Encounter (Signed)
PA resubmitted with additional diagnosis code, awaiting response.  Patient states she is doing better with stretching exercises and may not need PT or other intervention.

## 2017-09-09 ENCOUNTER — Encounter: Payer: Self-pay | Admitting: Family Medicine

## 2017-09-09 ENCOUNTER — Ambulatory Visit (INDEPENDENT_AMBULATORY_CARE_PROVIDER_SITE_OTHER): Payer: Medicare Other | Admitting: Family Medicine

## 2017-09-09 VITALS — BP 102/72 | HR 70 | Temp 98.7°F | Ht <= 58 in | Wt 168.8 lb

## 2017-09-09 DIAGNOSIS — R739 Hyperglycemia, unspecified: Secondary | ICD-10-CM | POA: Diagnosis not present

## 2017-09-09 DIAGNOSIS — R7309 Other abnormal glucose: Secondary | ICD-10-CM

## 2017-09-09 LAB — POCT GLYCOSYLATED HEMOGLOBIN (HGB A1C): HEMOGLOBIN A1C: 6.2 % — AB (ref 4.0–5.6)

## 2017-09-09 NOTE — Patient Instructions (Signed)
If you get lightheaded then stop the amlodipine/norvasc and update me.  Keep working on diet and exercise.   Take care.  Glad to see you. Use the eat right diet.  We can recheck your sugar at a physical.

## 2017-09-09 NOTE — Progress Notes (Signed)
Hyperglycemia.  Not diabetic by A1c.  She is following up with nutrition classes.  Labs d/w pt.  She is exercising.    She is going to get gabapentin to have on hand if needed.  She is better in the meantime.  She is exercising and stretching.    She isn't lightheaded.  Not dizzy.  Still on amlodipine.  No ADE on med.    Meds, vitals, and allergies reviewed.   ROS: Per HPI unless specifically indicated in ROS section   GEN: nad, alert and oriented HEENT: mucous membranes moist NECK: supple w/o LA CV: rrr.  no murmur PULM: ctab, no inc wob ABD: soft, +bs EXT: no edema SKIN: no acute rash

## 2017-09-11 NOTE — Assessment & Plan Note (Signed)
Not diabetic based on A1c.  Discussed with patient about low carbohydrate diet.  She is going to follow-up with nutrition.  She is exercising.  We can recheck periodically.  She will update me as needed.  See above.

## 2017-09-18 HISTORY — PX: LITHOTRIPSY: SUR834

## 2017-10-17 DIAGNOSIS — N202 Calculus of kidney with calculus of ureter: Secondary | ICD-10-CM | POA: Diagnosis not present

## 2017-10-17 DIAGNOSIS — B373 Candidiasis of vulva and vagina: Secondary | ICD-10-CM | POA: Diagnosis not present

## 2017-10-17 DIAGNOSIS — N200 Calculus of kidney: Secondary | ICD-10-CM | POA: Diagnosis not present

## 2017-10-24 DIAGNOSIS — N200 Calculus of kidney: Secondary | ICD-10-CM | POA: Diagnosis not present

## 2017-11-08 DIAGNOSIS — N200 Calculus of kidney: Secondary | ICD-10-CM | POA: Diagnosis not present

## 2017-12-05 ENCOUNTER — Other Ambulatory Visit: Payer: Self-pay

## 2017-12-18 ENCOUNTER — Other Ambulatory Visit: Payer: Self-pay | Admitting: Family Medicine

## 2017-12-18 DIAGNOSIS — I10 Essential (primary) hypertension: Secondary | ICD-10-CM

## 2017-12-18 DIAGNOSIS — M810 Age-related osteoporosis without current pathological fracture: Secondary | ICD-10-CM

## 2017-12-18 DIAGNOSIS — R739 Hyperglycemia, unspecified: Secondary | ICD-10-CM

## 2017-12-19 ENCOUNTER — Ambulatory Visit (INDEPENDENT_AMBULATORY_CARE_PROVIDER_SITE_OTHER): Payer: Medicare Other

## 2017-12-19 ENCOUNTER — Ambulatory Visit: Payer: Medicare Other

## 2017-12-19 VITALS — BP 138/70 | HR 79 | Temp 98.8°F | Ht <= 58 in | Wt 155.5 lb

## 2017-12-19 DIAGNOSIS — R739 Hyperglycemia, unspecified: Secondary | ICD-10-CM | POA: Diagnosis not present

## 2017-12-19 DIAGNOSIS — Z0000 Encounter for general adult medical examination without abnormal findings: Secondary | ICD-10-CM

## 2017-12-19 DIAGNOSIS — M810 Age-related osteoporosis without current pathological fracture: Secondary | ICD-10-CM | POA: Diagnosis not present

## 2017-12-19 DIAGNOSIS — I10 Essential (primary) hypertension: Secondary | ICD-10-CM

## 2017-12-19 LAB — LIPID PANEL
CHOLESTEROL: 189 mg/dL (ref 0–200)
HDL: 51.6 mg/dL (ref >39.00)
LDL Cholesterol: 110 mg/dL — ABNORMAL HIGH (ref 0–99)
NONHDL: 137.2
Total CHOL/HDL Ratio: 4
Triglycerides: 136 mg/dL (ref 0.0–149.0)
VLDL: 27.2 mg/dL (ref 0.0–40.0)

## 2017-12-19 LAB — COMPREHENSIVE METABOLIC PANEL
ALBUMIN: 4.1 g/dL (ref 3.5–5.2)
ALK PHOS: 99 U/L (ref 39–117)
ALT: 14 U/L (ref 0–35)
AST: 15 U/L (ref 0–37)
BILIRUBIN TOTAL: 0.3 mg/dL (ref 0.2–1.2)
BUN: 15 mg/dL (ref 6–23)
CO2: 27 mEq/L (ref 19–32)
CREATININE: 0.65 mg/dL (ref 0.40–1.20)
Calcium: 9.4 mg/dL (ref 8.4–10.5)
Chloride: 106 mEq/L (ref 96–112)
GFR: 114.85 mL/min (ref >60.00)
Glucose, Bld: 126 mg/dL — ABNORMAL HIGH (ref 70–99)
POTASSIUM: 4.1 meq/L (ref 3.5–5.1)
SODIUM: 142 meq/L (ref 135–145)
TOTAL PROTEIN: 6.8 g/dL (ref 6.0–8.3)

## 2017-12-19 LAB — HEMOGLOBIN A1C: HEMOGLOBIN A1C: 6.1 % (ref 4.6–6.5)

## 2017-12-19 LAB — VITAMIN D 25 HYDROXY (VIT D DEFICIENCY, FRACTURES): VITD: 43.44 ng/mL (ref 30.00–100.00)

## 2017-12-19 NOTE — Patient Instructions (Signed)
Ms. Nawabi , Thank you for taking time to come for your Medicare Wellness Visit. I appreciate your ongoing commitment to your health goals. Please review the following plan we discussed and let me know if I can assist you in the future.   These are the goals we discussed: Goals    . Increase physical activity     Starting 12/13/2016, I will continue to do line dancing for 60 minutes 3 days and to do Silver Sneakers for 45 minutes 3 days per week.        This is a list of the screening recommended for you and due dates:  Health Maintenance  Topic Date Due  . Mammogram  09/28/2018  . Tetanus Vaccine  07/05/2021  . Colon Cancer Screening  03/02/2022  . Flu Shot  Completed  . DEXA scan (bone density measurement)  Completed  .  Hepatitis C: One time screening is recommended by Center for Disease Control  (CDC) for  adults born from [birth year] through 1966.   Completed  . Pneumonia vaccines  Completed   Preventive Care for Adults  A healthy lifestyle and preventive care can promote health and wellness. Preventive health guidelines for adults include the following key practices.  . A routine yearly physical is a good way to check with your health care provider about your health and preventive screening. It is a chance to share any concerns and updates on your health and to receive a thorough exam.  . Visit your dentist for a routine exam and preventive care every 6 months. Brush your teeth twice a day and floss once a day. Good oral hygiene prevents tooth decay and gum disease.  . The frequency of eye exams is based on your age, health, family medical history, use  of contact lenses, and other factors. Follow your health care provider's recommendations for frequency of eye exams.  . Eat a healthy diet. Foods like vegetables, fruits, whole grains, low-fat dairy products, and lean protein foods contain the nutrients you need without too many calories. Decrease your intake of foods high in solid  fats, added sugars, and salt. Eat the right amount of calories for you. Get information about a proper diet from your health care provider, if necessary.  . Regular physical exercise is one of the most important things you can do for your health. Most adults should get at least 150 minutes of moderate-intensity exercise (any activity that increases your heart rate and causes you to sweat) each week. In addition, most adults need muscle-strengthening exercises on 2 or more days a week.  Silver Sneakers may be a benefit available to you. To determine eligibility, you may visit the website: www.silversneakers.com or contact program at 2056975642 Mon-Fri between 8AM-8PM.   . Maintain a healthy weight. The body mass index (BMI) is a screening tool to identify possible weight problems. It provides an estimate of body fat based on height and weight. Your health care provider can find your BMI and can help you achieve or maintain a healthy weight.   For adults 20 years and older: ? A BMI below 18.5 is considered underweight. ? A BMI of 18.5 to 24.9 is normal. ? A BMI of 25 to 29.9 is considered overweight. ? A BMI of 30 and above is considered obese.   . Maintain normal blood lipids and cholesterol levels by exercising and minimizing your intake of saturated fat. Eat a balanced diet with plenty of fruit and vegetables. Blood tests for lipids and  cholesterol should begin at age 7 and be repeated every 5 years. If your lipid or cholesterol levels are high, you are over 50, or you are at high risk for heart disease, you may need your cholesterol levels checked more frequently. Ongoing high lipid and cholesterol levels should be treated with medicines if diet and exercise are not working.  . If you smoke, find out from your health care provider how to quit. If you do not use tobacco, please do not start.  . If you choose to drink alcohol, please do not consume more than 2 drinks per day. One drink is  considered to be 12 ounces (355 mL) of beer, 5 ounces (148 mL) of wine, or 1.5 ounces (44 mL) of liquor.  . If you are 69-87 years old, ask your health care provider if you should take aspirin to prevent strokes.  . Use sunscreen. Apply sunscreen liberally and repeatedly throughout the day. You should seek shade when your shadow is shorter than you. Protect yourself by wearing long sleeves, pants, a wide-brimmed hat, and sunglasses year round, whenever you are outdoors.  . Once a month, do a whole body skin exam, using a mirror to look at the skin on your back. Tell your health care provider of new moles, moles that have irregular borders, moles that are larger than a pencil eraser, or moles that have changed in shape or color.

## 2017-12-20 NOTE — Progress Notes (Signed)
PCP notes:   Health maintenance:  No gaps identified.  Abnormal screenings:   None  Patient concerns:   None  Nurse concerns:  None  Next PCP appt:   12/26/17 @ 0845  I reviewed health advisor's note, was available for consultation on the day of service listed in this note, and agree with documentation and plan. Elsie Stain, MD.

## 2017-12-20 NOTE — Progress Notes (Signed)
Subjective:   Patricia Hoover is a 72 y.o. female who presents for Medicare Annual (Subsequent) preventive examination.  Review of Systems:  N/A Cardiac Risk Factors include: advanced age (>35men, >76 women);dyslipidemia;hypertension;obesity (BMI >30kg/m2)     Objective:     Vitals: BP 138/70 (BP Location: Right Arm, Patient Position: Sitting, Cuff Size: Large)   Pulse 79   Temp 98.8 F (37.1 C) (Oral)   Ht 4' 9.5" (1.461 m) Comment: no shoes  Wt 155 lb 8 oz (70.5 kg)   SpO2 93%   BMI 33.07 kg/m   Body mass index is 33.07 kg/m.  Advanced Directives 12/19/2017 06/13/2017 04/08/2017 02/28/2017 12/13/2016 05/04/2016 01/31/2016  Does Patient Have a Medical Advance Directive? Yes Yes Yes No Yes Yes No  Type of Paramedic of [place];Living will - [place];Living will - [place];Living will - -  Does patient want to make changes to medical advance directive? - - - - - - -  Copy of [place] in Chart? No - copy requested - No - copy requested - No - copy requested - -  Would patient like information on creating a medical advance directive? - - - Yes (Inpatient - patient requests chaplain consult to create a medical advance directive) - - No - Patient declined    Tobacco Social History   Tobacco Use  Smoking Status Former Smoker  . Types: Cigarettes  . Last attempt to quit: 01/19/1991  . Years since quitting: 26.9  Smokeless Tobacco Never Used     Counseling given: No   Clinical Intake:  Pre-visit preparation completed: Yes  Pain : No/denies pain Pain Score: 0-No pain     Nutritional Status: BMI > 30  Obese Nutritional Risks: None Diabetes: No  How often do you need to have someone help you when you read instructions, pamphlets, or other written materials from your doctor or pharmacy?: 1 - Never What is the last grade level you completed in school?: 12th grade + 1 yr college  Interpreter  Needed?: No  Comments: pt is a widow and lives alone Information entered by :: LPinson, LPN  Past Medical History:  Diagnosis Date  . Arrhythmia    possible hx, resolved prev  . Blood transfusion without reported diagnosis 1972   had reaction; had to stop  . Carpal tunnel syndrome   . Cataract 2019   resolved with surgery  . Duodenal ulcer   . H/O exercise stress test 2012   normal  . Heart murmur    as child  . High cholesterol   . History of kidney stones   . Hx of colonic polyps   . Hypertension   . Osteoporosis 2010   t score - 3.9  . Renal stones    Past Surgical History:  Procedure Laterality Date  . ABDOMINAL HYSTERECTOMY  1972   for endometriosis  . CATARACT EXTRACTION W/ INTRAOCULAR LENS IMPLANT Right 06/2017  . CATARACT EXTRACTION W/ INTRAOCULAR LENS IMPLANT Left 07/2017  . COLONOSCOPY Left 03/02/2017   Procedure: COLONOSCOPY;  Surgeon: Virgel Manifold, MD;  Location: [facility] ENDOSCOPY;  Service: Endoscopy;  Laterality: Left;  . COLONOSCOPY WITH PROPOFOL N/A 05/04/2016   Procedure: COLONOSCOPY WITH PROPOFOL;  Surgeon: Jonathon Bellows, MD;  Location: [facility] ENDOSCOPY;  Service: Endoscopy;  Laterality: N/A;  . ESOPHAGOGASTRODUODENOSCOPY Left 03/02/2017   Procedure: ESOPHAGOGASTRODUODENOSCOPY (EGD);  Surgeon: Virgel Manifold, MD;  Location: [facility] ENDOSCOPY;  Service: Endoscopy;  Laterality: Left;  .  ESOPHAGOGASTRODUODENOSCOPY (EGD) WITH PROPOFOL N/A 04/08/2017   Procedure: ESOPHAGOGASTRODUODENOSCOPY (EGD) WITH PROPOFOL;  Surgeon: Virgel Manifold, MD;  Location: [facility] ENDOSCOPY;  Service: Endoscopy;  Laterality: N/A;  . LITHOTRIPSY     for kidney stone x5  . LITHOTRIPSY  09/2017   Family History  Problem Relation Age of Onset  . Heart disease Mother   . Renal Disease Mother   . Heart failure Mother   . Colon cancer Neg Hx   . Breast cancer Neg Hx    Social History   Socioeconomic History  . Marital status: Widowed    Spouse name: Not on file  . Number of  children: Not on file  . Years of education: Not on file  . Highest education level: Not on file  Occupational History  . Occupation: Retired    Comment: Community education officer  Social Needs  . Financial resource strain: Not on file  . Food insecurity:    Worry: Not on file    Inability: Not on file  . Transportation needs:    Medical: Not on file    Non-medical: Not on file  Tobacco Use  . Smoking status: Former Smoker    Types: Cigarettes    Last attempt to quit: 01/19/1991    Years since quitting: 26.9  . Smokeless tobacco: Never Used  Substance and Sexual Activity  . Alcohol use: No  . Drug use: No  . Sexual activity: Not on file  Lifestyle  . Physical activity:    Days per week: Not on file    Minutes per session: Not on file  . Stress: Not on file  Relationships  . Social connections:    Talks on phone: Not on file    Gets together: Not on file    Attends religious service: Not on file    Active member of club or organization: Not on file    Attends meetings of clubs or organizations: Not on file    Relationship status: Not on file  Other Topics Concern  . Not on file  Social History Narrative   Education:  1 year Business School   Retired back to [employer] after working in [employer], [employer]   Widowed '93   2 kids    Outpatient Encounter Medications as of 12/19/2017  Medication Sig  . amLODipine (NORVASC) 2.5 MG tablet Take 1 tablet (2.5 mg total) by mouth daily.  . cholecalciferol (VITAMIN D) 1000 units tablet Take 2 tablets (2,000 Units total) by mouth daily.  . simvastatin (ZOCOR) 10 MG tablet Take 1 tablet (10 mg total) by mouth daily.  . [DISCONTINUED] gabapentin (NEURONTIN) 100 MG capsule Take 1-2 capsules (100-200 mg total) by mouth 3 (three) times daily as needed. (Patient not taking: Reported on 12/19/2017)   No facility-administered encounter medications on file as of 12/19/2017.     Activities of Daily Living In your present state of  health, do you have any difficulty performing the following activities: 12/19/2017 02/28/2017  Hearing? N -  Vision? N -  Difficulty concentrating or making decisions? N -  Walking or climbing stairs? N -  Dressing or bathing? N -  Doing errands, shopping? N N  Preparing Food and eating ? N -  Using the Toilet? N -  In the past six months, have you accidently leaked urine? N -  Do you have problems with loss of bowel control? N -  Managing your Medications? N -  Managing your Finances?  N -  Housekeeping or managing your Housekeeping? N -  Some recent data might be hidden    Patient Care Team: Tonia Ghent, MD as PCP - General (Family Medicine) Bryson Ha, OD as Consulting Physician (Optometry)    Assessment:   This is a routine wellness examination for Malana.   Hearing Screening (Inadequate exam)   125Hz  250Hz  500Hz  1000Hz  2000Hz  3000Hz  4000Hz  6000Hz  8000Hz   Right ear:   40 40 40  40    Left ear:   40 40 40  40    Vision Screening Comments: Vision exam in 2019 with Dr. Tommy Rainwater   Exercise Activities and Dietary recommendations Current Exercise Habits: Structured exercise class, Type of exercise: Other - see comments(Silver Sneakers 45 min, Line dancing 60 min), Time (Minutes): 60, Frequency (Times/Week): 6, Weekly Exercise (Minutes/Week): 360, Intensity: Moderate, Exercise limited by: None identified  Goals    . Increase physical activity     Starting 12/13/2016, I will continue to do line dancing for 60 minutes 3 days and to do Silver Sneakers for 45 minutes 3 days per week.        Fall Risk Fall Risk  12/19/2017 12/13/2016 12/10/2015 10/25/2014 06/04/2013  Falls in the past year? 0 No No No No   Depression Screen PHQ 2/9 Scores 12/19/2017 12/13/2016 12/10/2015 10/25/2014  PHQ - 2 Score 0 0 0 0  PHQ- 9 Score 0 0 - -     Cognitive Function MMSE - Mini Mental State Exam 12/19/2017 12/13/2016 12/10/2015  Orientation to time 5 5 5   Orientation to Place 5 5 5     Registration 3 3 3   Attention/ Calculation 0 0 0  Recall 3 3 3   Language- name 2 objects 0 0 0  Language- repeat 1 1 1   Language- follow 3 step command 3 3 3   Language- read & follow direction 0 0 0  Write a sentence 0 0 0  Copy design 0 0 0  Total score 20 20 20      PLEASE NOTE: A Mini-Cog screen was completed. Maximum score is 20. A value of 0 denotes this part of [place] MMSE was not completed or the patient failed this part of the Mini-Cog screening.   Mini-Cog Screening Orientation to Time - Max 5 pts Orientation to Place - Max 5 pts Registration - Max 3 pts Recall - Max 3 pts Language Repeat - Max 1 pts Language Follow 3 Step Command - Max 3 pts     Immunization History  Administered Date(s) Administered  . Influenza,inj,Quad PF,6+ Mos 10/25/2014, 12/10/2015, 12/13/2016  . Influenza-Unspecified 10/10/2013, 12/03/2017  . Pneumococcal Conjugate-13 10/25/2014  . Pneumococcal Polysaccharide-23 06/04/2013  . Tdap 07/06/2011  . Zoster 07/06/2011    Screening Tests Health Maintenance  Topic Date Due  . MAMMOGRAM  09/28/2018  . TETANUS/TDAP  07/05/2021  . COLONOSCOPY  03/02/2022  . INFLUENZA VACCINE  Completed  . DEXA SCAN  Completed  . Hepatitis C Screening  Completed  . PNA vac Low Risk Adult  Completed      Plan:     I have personally reviewed, addressed, and noted the following in the patient's chart:  A. Medical and social history B. Use of alcohol, tobacco or illicit drugs  C. Current medications and supplements D. Functional ability and status E.  Nutritional status F.  Physical activity G. Advance directives H. List of other physicians I.  Hospitalizations, surgeries, and ER visits in previous 12 months J.  Vitals K. Screenings  to include hearing, vision, cognitive, depression L. Referrals and appointments - none  In addition, I have reviewed and discussed with patient certain preventive protocols, quality metrics, and best practice  recommendations. A written personalized care plan for preventive services as well as general preventive health recommendations were provided to patient.  See attached scanned questionnaire for additional information.   Signed,   Lindell Noe, MHA, BS, LPN Health Coach

## 2017-12-26 ENCOUNTER — Encounter: Payer: Medicare Other | Admitting: Family Medicine

## 2017-12-27 ENCOUNTER — Telehealth: Payer: Self-pay | Admitting: Family Medicine

## 2017-12-27 NOTE — Telephone Encounter (Signed)
I don't see how she could be worked in for a CPE but I'll ask Dr. Damita Dunnings.

## 2017-12-27 NOTE — Telephone Encounter (Signed)
Best number [phone]  Pt called stating she forgot about her appointment yesterday for cpx.  And wanted to know if she will be charged a no show fee.  Pt wanted to be r/s before end of year.  Offered 12/19 but she had another appointment that day.  Can she be worked in?

## 2018-01-05 ENCOUNTER — Ambulatory Visit (INDEPENDENT_AMBULATORY_CARE_PROVIDER_SITE_OTHER): Payer: Medicare Other | Admitting: Family Medicine

## 2018-01-05 ENCOUNTER — Encounter: Payer: Self-pay | Admitting: Family Medicine

## 2018-01-05 VITALS — BP 138/80 | HR 79 | Temp 98.8°F | Ht <= 58 in | Wt 154.8 lb

## 2018-01-05 DIAGNOSIS — R739 Hyperglycemia, unspecified: Secondary | ICD-10-CM | POA: Diagnosis not present

## 2018-01-05 DIAGNOSIS — I10 Essential (primary) hypertension: Secondary | ICD-10-CM | POA: Diagnosis not present

## 2018-01-05 DIAGNOSIS — R202 Paresthesia of skin: Secondary | ICD-10-CM | POA: Diagnosis not present

## 2018-01-05 DIAGNOSIS — Z0000 Encounter for general adult medical examination without abnormal findings: Secondary | ICD-10-CM

## 2018-01-05 DIAGNOSIS — E785 Hyperlipidemia, unspecified: Secondary | ICD-10-CM | POA: Diagnosis not present

## 2018-01-05 DIAGNOSIS — Z7189 Other specified counseling: Secondary | ICD-10-CM

## 2018-01-05 MED ORDER — SIMVASTATIN 10 MG PO TABS: 10.0000 mg | ORAL_TABLET | ORAL | Status: DC

## 2018-01-05 NOTE — Progress Notes (Signed)
Hand sx.  Some numbness.  Some burning, tingling.  Noted at night.  She has OTC splints but hasn't used recently.  She does not have weakness or daytime troubles.  Elevated Cholesterol: Using medications without problems:yes Muscle aches: no Diet compliance:yes Exercise:yes  Hypertension:    Using medication without problems or lightheadedness: yes Chest pain with exertion:no Edema:no Short of breath:no  Hyperglycemia d/w pt.  A1c <6.5.  D/w pt about diet and exercise.  Intentional weight loss. She is going to the gym, line dancing, and cutting out sweets.      DXA deferred given med intolerance.   Flu 2019 PNA up to date.  Tetanus 2013 zostavax 2013 Colonoscopy up to date.  Mammogram due, d/w pt.  Advance directive dw pt. Patricia Hoover designated if patient were incapacitated.   Meds, vitals, and allergies reviewed.   PMH and SH reviewed  ROS: Per HPI unless specifically indicated in ROS section   GEN: nad, alert and oriented HEENT: mucous membranes moist NECK: supple w/o LA CV: rrr. PULM: ctab, no inc wob ABD: soft, +bs EXT: no edema SKIN: no acute rash Tinel neg, monofilament and vibration sense intact.   Grip normal in the hands.

## 2018-01-05 NOTE — Patient Instructions (Addendum)
You can call for a mammogram at [facility] [place] me a copy of your living will to scan in your chart.  Keep working on diet and exercise.  Thanks for your effort.  Use the over the counter splints at night for your wrists.   Take care.  Glad to see you.

## 2018-01-08 DIAGNOSIS — R202 Paresthesia of skin: Secondary | ICD-10-CM | POA: Insufficient documentation

## 2018-01-08 DIAGNOSIS — Z0000 Encounter for general adult medical examination without abnormal findings: Secondary | ICD-10-CM | POA: Insufficient documentation

## 2018-01-08 NOTE — Assessment & Plan Note (Signed)
Only in the hands.  Some occasional numbness.  Noted at night.  Likely has mechanical symptoms due to local compression at the wrist.  Discussed options.  Reasonable to try nocturnal splints and update me as needed.  She agrees.  No weakness on exam.

## 2018-01-08 NOTE — Assessment & Plan Note (Signed)
Continue work on diet and exercise.  No change in meds at this point.  She agrees.  Reasonable control.

## 2018-01-08 NOTE — Assessment & Plan Note (Signed)
Advance directive dw pt. Patricia Hoover designated if patient were incapacitated.  °

## 2018-01-08 NOTE — Assessment & Plan Note (Signed)
Continue work on diet and exercise.  Continue statin for now.  Lipids are reasonable for now.  Discussed.  She agrees.

## 2018-01-08 NOTE — Assessment & Plan Note (Signed)
DXA deferred given med intolerance.   Flu 2019 PNA up to date.  Tetanus 2013 zostavax 2013 Colonoscopy up to date.  Mammogram due, d/w pt.  Advance directive dw pt. Patricia Hoover designated if patient were incapacitated.

## 2018-01-08 NOTE — Assessment & Plan Note (Signed)
Discussed diet and exercise and low carbohydrate foods.  Labs discussed with patient.  She agrees with plan. >25 minutes spent in face to face time with patient, >50% spent in counselling or coordination of care.  No change in meds at this point.

## 2018-03-03 DIAGNOSIS — K921 Melena: Secondary | ICD-10-CM | POA: Insufficient documentation

## 2018-03-03 DIAGNOSIS — Z886 Allergy status to analgesic agent status: Secondary | ICD-10-CM | POA: Diagnosis not present

## 2018-03-03 DIAGNOSIS — Z888 Allergy status to other drugs, medicaments and biological substances status: Secondary | ICD-10-CM | POA: Insufficient documentation

## 2018-03-03 DIAGNOSIS — I10 Essential (primary) hypertension: Secondary | ICD-10-CM | POA: Diagnosis not present

## 2018-03-03 DIAGNOSIS — Z8249 Family history of ischemic heart disease and other diseases of the circulatory system: Secondary | ICD-10-CM | POA: Diagnosis not present

## 2018-03-03 DIAGNOSIS — K648 Other hemorrhoids: Secondary | ICD-10-CM | POA: Insufficient documentation

## 2018-03-03 DIAGNOSIS — K922 Gastrointestinal hemorrhage, unspecified: Secondary | ICD-10-CM | POA: Diagnosis not present

## 2018-03-03 DIAGNOSIS — Z87891 Personal history of nicotine dependence: Secondary | ICD-10-CM | POA: Diagnosis not present

## 2018-03-03 DIAGNOSIS — Z79899 Other long term (current) drug therapy: Secondary | ICD-10-CM | POA: Insufficient documentation

## 2018-03-03 DIAGNOSIS — K644 Residual hemorrhoidal skin tags: Secondary | ICD-10-CM | POA: Diagnosis not present

## 2018-03-04 ENCOUNTER — Observation Stay
Admission: EM | Admit: 2018-03-04 | Discharge: 2018-03-05 | Disposition: A | Discharge status: Home / Self Care | Payer: Medicare Other | Attending: Internal Medicine | Admitting: Internal Medicine

## 2018-03-04 ENCOUNTER — Encounter: Payer: Self-pay | Admitting: *Deleted

## 2018-03-04 ENCOUNTER — Other Ambulatory Visit: Payer: Self-pay

## 2018-03-04 DIAGNOSIS — K625 Hemorrhage of anus and rectum: Secondary | ICD-10-CM | POA: Diagnosis not present

## 2018-03-04 DIAGNOSIS — K922 Gastrointestinal hemorrhage, unspecified: Secondary | ICD-10-CM | POA: Diagnosis not present

## 2018-03-04 LAB — CBC
HCT: 37.4 % (ref 36.0–46.0)
HCT: 37.4 % (ref 36.0–46.0)
Hemoglobin: 12 g/dL (ref 12.0–15.0)
Hemoglobin: 12.4 g/dL (ref 12.0–15.0)
MCH: 29.1 pg (ref 26.0–34.0)
MCH: 29.3 pg (ref 26.0–34.0)
MCHC: 32.1 g/dL (ref 30.0–36.0)
MCHC: 33.2 g/dL (ref 30.0–36.0)
MCV: 88.4 fL (ref 80.0–100.0)
MCV: 90.6 fL (ref 80.0–100.0)
PLATELETS: 275 10*3/uL (ref 150–400)
Platelets: 253 10*3/uL (ref 150–400)
RBC: 4.13 MIL/uL (ref 3.87–5.11)
RBC: 4.23 MIL/uL (ref 3.87–5.11)
RDW: 13.3 % (ref 11.5–15.5)
RDW: 13.4 % (ref 11.5–15.5)
WBC: 11.3 10*3/uL — ABNORMAL HIGH (ref 4.0–10.5)
WBC: 9.5 10*3/uL (ref 4.0–10.5)
nRBC: 0 % (ref 0.0–0.2)
nRBC: 0 % (ref 0.0–0.2)

## 2018-03-04 LAB — COMPREHENSIVE METABOLIC PANEL
ALBUMIN: 3.5 g/dL (ref 3.5–5.0)
ALT: 11 U/L (ref 0–44)
AST: 18 U/L (ref 15–41)
Alkaline Phosphatase: 73 U/L (ref 38–126)
Anion gap: 5 (ref 5–15)
BUN: 17 mg/dL (ref 8–23)
CO2: 24 mmol/L (ref 22–32)
CREATININE: 0.48 mg/dL (ref 0.44–1.00)
Calcium: 8.4 mg/dL — ABNORMAL LOW (ref 8.9–10.3)
Chloride: 112 mmol/L — ABNORMAL HIGH (ref 98–111)
GFR calc Af Amer: >60 mL/min (ref >60)
GFR calc non Af Amer: >60 mL/min (ref >60)
Glucose, Bld: 134 mg/dL — ABNORMAL HIGH (ref 70–99)
Potassium: 3.9 mmol/L (ref 3.5–5.1)
Sodium: 141 mmol/L (ref 135–145)
Total Bilirubin: 0.5 mg/dL (ref 0.3–1.2)
Total Protein: 6.2 g/dL — ABNORMAL LOW (ref 6.5–8.1)

## 2018-03-04 LAB — GLUCOSE, CAPILLARY: Glucose-Capillary: 88 mg/dL (ref 70–99)

## 2018-03-04 LAB — BASIC METABOLIC PANEL
Anion gap: 4 — ABNORMAL LOW (ref 5–15)
BUN: 16 mg/dL (ref 8–23)
CO2: 26 mmol/L (ref 22–32)
Calcium: 8.4 mg/dL — ABNORMAL LOW (ref 8.9–10.3)
Chloride: 112 mmol/L — ABNORMAL HIGH (ref 98–111)
Creatinine, Ser: 0.46 mg/dL (ref 0.44–1.00)
GFR calc Af Amer: >60 mL/min (ref >60)
Glucose, Bld: 114 mg/dL — ABNORMAL HIGH (ref 70–99)
Potassium: 3.5 mmol/L (ref 3.5–5.1)
Sodium: 142 mmol/L (ref 135–145)

## 2018-03-04 LAB — PROTIME-INR
INR: 0.98
Prothrombin Time: 12.9 seconds (ref 11.4–15.2)

## 2018-03-04 LAB — TYPE AND SCREEN
ABO/RH(D): O POS
Antibody Screen: NEGATIVE

## 2018-03-04 LAB — HEMOGLOBIN AND HEMATOCRIT, BLOOD
HCT: 34.4 % — ABNORMAL LOW (ref 36.0–46.0)
Hemoglobin: 11 g/dL — ABNORMAL LOW (ref 12.0–15.0)

## 2018-03-04 LAB — APTT: APTT: 30 s (ref 24–36)

## 2018-03-04 MED ORDER — ACETAMINOPHEN 325 MG PO TABS: 650.0000 mg | ORAL_TABLET | Freq: Four times a day (QID) | ORAL | Status: DC | PRN

## 2018-03-04 MED ORDER — PANTOPRAZOLE SODIUM 40 MG IV SOLR
40.0000 mg | Freq: Two times a day (BID) | INTRAVENOUS | Status: DC
  Administered 2018-03-04 – 2018-03-05 (×3): 40 mg via INTRAVENOUS
  Filled 2018-03-04 (×3): qty 40

## 2018-03-04 MED ORDER — SIMVASTATIN 10 MG PO TABS
10.0000 mg | ORAL_TABLET | ORAL | Status: DC
  Administered 2018-03-04: 10 mg via ORAL
  Filled 2018-03-04: qty 1

## 2018-03-04 MED ORDER — ONDANSETRON HCL 4 MG/2ML IJ SOLN: 4.0000 mg | Freq: Four times a day (QID) | INTRAMUSCULAR | Status: DC | PRN

## 2018-03-04 MED ORDER — ONDANSETRON HCL 4 MG PO TABS: 4.0000 mg | ORAL_TABLET | Freq: Four times a day (QID) | ORAL | Status: DC | PRN

## 2018-03-04 MED ORDER — SODIUM CHLORIDE 0.9 % IV BOLUS
1000.0000 mL | Freq: Once | INTRAVENOUS | Status: AC
  Administered 2018-03-04: 1000 mL via INTRAVENOUS

## 2018-03-04 MED ORDER — ACETAMINOPHEN 650 MG RE SUPP: 650.0000 mg | Freq: Four times a day (QID) | RECTAL | Status: DC | PRN

## 2018-03-04 MED ORDER — VITAMIN D 25 MCG (1000 UNIT) PO TABS
2000.0000 [IU] | ORAL_TABLET | Freq: Every day | ORAL | Status: DC
  Administered 2018-03-04 – 2018-03-05 (×2): 2000 [IU] via ORAL
  Filled 2018-03-04 (×2): qty 2

## 2018-03-04 MED ORDER — SODIUM CHLORIDE 0.9 % IV SOLN
INTRAVENOUS | Status: DC
  Administered 2018-03-04 (×2): via INTRAVENOUS

## 2018-03-04 MED ORDER — SODIUM CHLORIDE 0.9% FLUSH
3.0000 mL | Freq: Two times a day (BID) | INTRAVENOUS | Status: DC
  Administered 2018-03-04 – 2018-03-05 (×3): 3 mL via INTRAVENOUS

## 2018-03-04 MED ORDER — AMLODIPINE BESYLATE 5 MG PO TABS
2.5000 mg | ORAL_TABLET | Freq: Every day | ORAL | Status: DC
  Administered 2018-03-04 – 2018-03-05 (×2): 2.5 mg via ORAL
  Filled 2018-03-04 (×2): qty 1

## 2018-03-04 NOTE — H&P (Signed)
[facility] [place] NAME: Patricia Hoover    MR#:  [id number]  DATE OF BIRTH:  09-20-1945  DATE OF ADMISSION:  03/04/2018  PRIMARY CARE PHYSICIAN: Tonia Ghent, MD   REQUESTING/REFERRING PHYSICIAN: Dr. Owens Shark  CHIEF COMPLAINT:   Chief Complaint  Patient presents with  . Rectal Bleeding    HISTORY OF PRESENT ILLNESS:  Patricia Hoover  is a 73 y.o. female with a known history listed below presented to emergency room for evaluation of rectal bleeding.  Patient has total 4 episodes of rectal bleeding today.  Patient denies any abdominal pain or nausea or vomiting.  Patient has painless blood in stool.  Patient noticed bright red with mix of dark blood in it.  Patient denies epigastric pain.  Patient had previous similar episode 1 year ago required endoscopy evaluation.  Patient denies dizziness or palpitation.  No other complaints.  In emergency room patient had bowel movement with blood in commode.  Patient has initial labs done in the emergency room.  Hemoglobin is stable.  Hospitalist team requested for admission.  PAST MEDICAL HISTORY:   Past Medical History:  Diagnosis Date  . Arrhythmia    possible hx, resolved prev  . Blood transfusion without reported diagnosis 1972   had reaction; had to stop  . Carpal tunnel syndrome   . Cataract 2019   resolved with surgery  . Duodenal ulcer   . H/O exercise stress test 2012   normal  . Heart murmur    as child  . High cholesterol   . History of kidney stones   . Hx of colonic polyps   . Hypertension   . Osteoporosis 2010   t score - 3.9  . Renal stones     PAST SURGICAL HISTORY:   Past Surgical History:  Procedure Laterality Date  . ABDOMINAL HYSTERECTOMY  1972   for endometriosis  . CATARACT EXTRACTION W/ INTRAOCULAR LENS IMPLANT Right 06/2017  . CATARACT EXTRACTION W/ INTRAOCULAR LENS IMPLANT Left 07/2017  . COLONOSCOPY Left 03/02/2017   Procedure: COLONOSCOPY;  Surgeon:  Virgel Manifold, MD;  Location: [facility] ENDOSCOPY;  Service: Endoscopy;  Laterality: Left;  . COLONOSCOPY WITH PROPOFOL N/A 05/04/2016   Procedure: COLONOSCOPY WITH PROPOFOL;  Surgeon: Jonathon Bellows, MD;  Location: [facility] ENDOSCOPY;  Service: Endoscopy;  Laterality: N/A;  . ESOPHAGOGASTRODUODENOSCOPY Left 03/02/2017   Procedure: ESOPHAGOGASTRODUODENOSCOPY (EGD);  Surgeon: Virgel Manifold, MD;  Location[facility] ENDOSCOPY;  Service: Endoscopy;  Laterality: Left;  . ESOPHAGOGASTRODUODENOSCOPY (EGD) WITH PROPOFOL N/A 04/08/2017   Procedure: ESOPHAGOGASTRODUODENOSCOPY (EGD) WITH PROPOFOL;  Surgeon: Virgel Manifold, MD;  Location: [facility] ENDOSCOPY;  Service: Endoscopy;  Laterality: N/A;  . LITHOTRIPSY     for kidney stone x5  . LITHOTRIPSY  09/2017    SOCIAL HISTORY:   Social History   Tobacco Use  . Smoking status: Former Smoker    Types: Cigarettes    Last attempt to quit: 01/19/1991    Years since quitting: 27.1  . Smokeless tobacco: Never Used  Substance Use Topics  . Alcohol use: No    FAMILY HISTORY:   Family History  Problem Relation Age of Onset  . Heart disease Mother   . Renal Disease Mother   . Heart failure Mother   . Colon cancer Neg Hx   . Breast cancer Neg Hx     DRUG ALLERGIES:   Allergies  Allergen Reactions  . Ace Inhibitors Swelling  . Angiotensin Receptor Blockers  Would avoid, h/o swelling with ACE prev  . Apple Other (See Comments)    heartburn  . Aspirin Other (See Comments)    GI bleed  . Bisphosphonates     GI side effects GI side effects  . Nsaids     GI bleed  . Prolia [Denosumab]     GI upset GI upset    REVIEW OF SYSTEMS:   ROS -12 point review of system reviewed positive as per HPI otherwise negative  MEDICATIONS AT HOME:   Prior to Admission medications   Medication Sig Start Date End Date Taking? Authorizing Provider  amLODipine (NORVASC) 2.5 MG tablet Take 1 tablet (2.5 mg total) by mouth daily. 08/30/17  Yes Tonia Ghent, MD  cholecalciferol (VITAMIN D) 1000 units tablet Take 2 tablets (2,000 Units total) by mouth daily. 12/16/15  Yes Tonia Ghent, MD  simvastatin (ZOCOR) 10 MG tablet Take 1 tablet (10 mg total) by mouth every other day. 01/05/18  Yes Tonia Ghent, MD      VITAL SIGNS:  Blood pressure (!) 156/76, pulse 64, temperature 98.3 F (36.8 C), temperature source Oral, resp. rate 16, height 4\' 9"  (1.448 m), weight 69.6 kg, SpO2 100 %.  PHYSICAL EXAMINATION:  Physical Exam  GENERAL:  73 y.o.-year-old patient lying in the bed with no acute distress.  EYES: Pupils equal, round, reactive to light and accommodation. No scleral icterus. Extraocular muscles intact.  HEENT: Head atraumatic, normocephalic. Oropharynx and nasopharynx clear.  NECK:  Supple, no jugular venous distention. No thyroid enlargement, no tenderness.  LUNGS: Normal breath sounds bilaterally, no wheezing, rales,rhonchi or crepitation. No use of accessory muscles of respiration.  CARDIOVASCULAR: S1, S2 normal. No murmurs, rubs, or gallops.  ABDOMEN: Soft, nontender, nondistended. Bowel sounds present. No organomegaly or mass.  EXTREMITIES: No pedal edema, cyanosis, or clubbing.  NEUROLOGIC: Cranial nerves II through XII are intact. Muscle strength 5/5 in all extremities. Sensation intact. Gait not checked.  PSYCHIATRIC: The patient is alert and oriented x 3.  SKIN: No obvious rash, lesion, or ulcer.   LABORATORY PANEL:   CBC Recent Labs  Lab 03/04/18 0438  WBC 9.5  HGB 12.0  HCT 37.4  PLT 253   ------------------------------------------------------------------------------------------------------------------  Chemistries  Recent Labs  Lab 03/04/18 0100 03/04/18 0438  NA 141 142  K 3.9 3.5  CL 112* 112*  CO2 24 26  GLUCOSE 134* 114*  BUN 17 16  CREATININE 0.48 0.46  CALCIUM 8.4* 8.4*  AST 18  --   ALT 11  --   ALKPHOS 73  --   BILITOT 0.5  --     ------------------------------------------------------------------------------------------------------------------  Cardiac Enzymes No results for input(s): TROPONINI in the last 168 hours. ------------------------------------------------------------------------------------------------------------------  RADIOLOGY:  No results found.    IMPRESSION AND PLAN:   1.  Rectal bleeding: Stable hemoglobin.  Likely diverticular.  Monitor H&H.  Transfuse as indicated.  GI consult requested.  Follow-up recommendation.  Keep patient n.p.o.  IV Protonix.  2.  Chronic other medical problems: Monitor.  Continue home medication as ordered.  DVT prophylaxis: SCD  Estimated length of stay less than 2 midnights  All the records are reviewed and case discussed with ED provider. Management plans discussed with the patient, family and they are in agreement.  CODE STATUS: Full  TOTAL TIME TAKING CARE OF THIS PATIENT: 30 minutes.    Sedalia Muta M.D on 03/04/2018 at 5:44 AM  Between 7am to 6pm - Pager - [phone]  After 6pm go  to [email or web address] - Proofreader  [facility]  Office  [phone]  CC: Primary care physician; Tonia Ghent, MD

## 2018-03-04 NOTE — ED Triage Notes (Signed)
Pt states was having a bowel movement tonight when she began to experience rectal bleeding with clots. Pt states she has had 3 episodes of cramps with bleeding since began. Pt denies dizziness, lightheadedness.

## 2018-03-04 NOTE — ED Provider Notes (Signed)
[facility] Emergency Department Provider Note _______________________   First MD Initiated Contact with Patient 03/04/18 0032     (approximate)  I have reviewed the triage vital signs and the nursing notes.   HISTORY  Chief Complaint Rectal Bleeding    HPI Patricia Hoover is a 73 y.o. female with below list of chronic medical conditions including previous GI bleeds x3 presents to the emergency department with history of 4 large-volume bloody bowel movements today.  Patient actually had completed a bowel movement on my arrival to the room toilet with a considerable amount of blood noted in the commode.  Patient denies any chest pain no shortness of breath.  Patient denies any dizziness or palpitations.  Patient denies any abdominal pain.   Past Medical History:  Diagnosis Date  . Arrhythmia    possible hx, resolved prev  . Blood transfusion without reported diagnosis 1972   had reaction; had to stop  . Carpal tunnel syndrome   . Cataract 2019   resolved with surgery  . Duodenal ulcer   . H/O exercise stress test 2012   normal  . Heart murmur    as child  . High cholesterol   . History of kidney stones   . Hx of colonic polyps   . Hypertension   . Osteoporosis 2010   t score - 3.9  . Renal stones     Patient Active Problem List   Diagnosis Date Noted  . Healthcare maintenance 01/08/2018  . Paresthesia 01/08/2018  . Neck pain 08/31/2017  . Angioedema 06/15/2017  . Peptic ulcer of duodenum   . Other specified diseases of esophagus   . Hematochezia   . External hemorrhoids   . Diverticulosis of large intestine without diverticulitis   . Internal hemorrhoids   . Duodenal ulceration   . Stomach irritation   . GIB (gastrointestinal bleeding) 02/28/2017  . Abnormal stools 02/27/2017  . Advance care planning 12/23/2016  . Kidney stone 04/20/2016  . Heartburn 12/17/2015  . Medicare annual wellness visit, subsequent 06/05/2013  . Hyperglycemia  06/05/2013  . Knee pain 08/31/2012  . Osteoporosis 08/18/2012  . HTN (hypertension) 08/09/2012  . HLD (hyperlipidemia) 08/09/2012    Past Surgical History:  Procedure Laterality Date  . ABDOMINAL HYSTERECTOMY  1972   for endometriosis  . CATARACT EXTRACTION W/ INTRAOCULAR LENS IMPLANT Right 06/2017  . CATARACT EXTRACTION W/ INTRAOCULAR LENS IMPLANT Left 07/2017  . COLONOSCOPY Left 03/02/2017   Procedure: COLONOSCOPY;  Surgeon: Virgel Manifold, MD;  Location: [facility] ENDOSCOPY;  Service: Endoscopy;  Laterality: Left;  . COLONOSCOPY WITH PROPOFOL N/A 05/04/2016   Procedure: COLONOSCOPY WITH PROPOFOL;  Surgeon: Jonathon Bellows, MD;  Location: [facility] ENDOSCOPY;  Service: Endoscopy;  Laterality: N/A;  . ESOPHAGOGASTRODUODENOSCOPY Left 03/02/2017   Procedure: ESOPHAGOGASTRODUODENOSCOPY (EGD);  Surgeon: Virgel Manifold, MD;  Location: [facility] ENDOSCOPY;  Service: Endoscopy;  Laterality: Left;  . ESOPHAGOGASTRODUODENOSCOPY (EGD) WITH PROPOFOL N/A 04/08/2017   Procedure: ESOPHAGOGASTRODUODENOSCOPY (EGD) WITH PROPOFOL;  Surgeon: Virgel Manifold, MD;  Location: [facility] ENDOSCOPY;  Service: Endoscopy;  Laterality: N/A;  . LITHOTRIPSY     for kidney stone x5  . LITHOTRIPSY  09/2017    Prior to Admission medications   Medication Sig Start Date End Date Taking? Authorizing Provider  amLODipine (NORVASC) 2.5 MG tablet Take 1 tablet (2.5 mg total) by mouth daily. 08/30/17  Yes Tonia Ghent, MD  cholecalciferol (VITAMIN D) 1000 units tablet Take 2 tablets (2,000 Units total) by mouth daily. 12/16/15  Yes Tonia Ghent, MD  simvastatin (ZOCOR) 10 MG tablet Take 1 tablet (10 mg total) by mouth every other day. 01/05/18  Yes Tonia Ghent, MD    Allergies Ace inhibitors; Angiotensin receptor blockers; Apple; Aspirin; Bisphosphonates; Nsaids; and Prolia [denosumab]  Family History  Problem Relation Age of Onset  . Heart disease Mother   . Renal Disease Mother   . Heart failure Mother   . Colon  cancer Neg Hx   . Breast cancer Neg Hx     Social History Social History   Tobacco Use  . Smoking status: Former Smoker    Types: Cigarettes    Last attempt to quit: 01/19/1991    Years since quitting: 27.1  . Smokeless tobacco: Never Used  Substance Use Topics  . Alcohol use: No  . Drug use: No    Review of Systems Constitutional: No fever/chills Eyes: No visual changes. ENT: No sore throat. Cardiovascular: Denies chest pain. Respiratory: Denies shortness of breath. Gastrointestinal: No abdominal pain.  No nausea, no vomiting.  No diarrhea.  No constipation.  Positive for rectal bleeding. Genitourinary: Negative for dysuria. Musculoskeletal: Negative for neck pain.  Negative for back pain. Integumentary: Negative for rash. Neurological: Negative for headaches, focal weakness or numbness.  ____________________________________________   PHYSICAL EXAM:  VITAL SIGNS: ED Triage Vitals [03/04/18 0004]  Enc Vitals Group     BP (!) 172/81     Pulse Rate 78     Resp 16     Temp 98.2 F (36.8 C)     Temp Source Oral     SpO2 100 %     Weight 70.3 kg (155 lb)     Height 1.448 m (4\' 9" )     Head Circumference      Peak Flow      Pain Score 0     Pain Loc      Pain Edu?      Excl. in [place]?     Constitutional: Alert and oriented. Well appearing and in no acute distress. Eyes: Conjunctivae are pale Mouth/Throat: Mucous membranes are moist. Oropharynx non-erythematous. Neck: No stridor.   Cardiovascular: Normal rate, regular rhythm. Good peripheral circulation. Grossly normal heart sounds. Respiratory: Normal respiratory effort.  No retractions. Lungs CTAB. Gastrointestinal: Soft and nontender. No distention.  Musculoskeletal: No lower extremity tenderness nor edema. No gross deformities of extremities. Neurologic:  Normal speech and language. No gross focal neurologic deficits are appreciated.  Skin:  Skin is warm, dry and intact. No rash noted. Psychiatric: Mood and  affect are normal. Speech and behavior are normal.  ____________________________________________   LABS (all labs ordered are listed, but only abnormal results are displayed)  Labs Reviewed  CBC - Abnormal; Notable for the following components:      Result Value   WBC 11.3 (*)    All other components within normal limits  COMPREHENSIVE METABOLIC PANEL  TYPE AND SCREEN   _______________________  Procedures   ____________________________________________   INITIAL IMPRESSION / ASSESSMENT AND PLAN / ED COURSE  As part of my medical decision making, I reviewed the following data within the electronic MEDICAL RECORD NUMBER   73 year old female presented with above-stated history and physical exam secondary to gastrointestinal bleeding presumed to be lower GI.  Consider large-volume blood x4 anticipate that the patient's hemoglobin will decrease despite the fact that it is normal at this time as this does not potentially reflect the acute blood loss.  Patient discussed with Dr. Posey Pronto hospitalist on-call  for admission for further evaluation and management.  Patient hemodynamically stable at this time ____________________________________________  FINAL CLINICAL IMPRESSION(S) / ED DIAGNOSES  Final diagnoses:  Acute GI bleeding     MEDICATIONS GIVEN DURING THIS VISIT:  Medications - No data to display   ED Discharge Orders    None       Note:  This document was prepared using Dragon voice recognition software and may include unintentional dictation errors.   Gregor Hams, MD 03/04/18 [phone]

## 2018-03-04 NOTE — Consult Note (Signed)
[facility] GI Inpatient Consult Note   Kathline Magic, M.D.  Reason for Consult: Lower Gastrointestinal bleeding   Attending Requesting Consult: Carol Ada   History of Present Illness: Patricia Hoover is a 73 y.o. female presented yesterday to the emergency room for acute onset of painless lower gastrointestinal bleeding after having a bowel movement.  This appeared to happen twice at home and twice in the emergency room after patient arrived.  Patient denies any syncopal or presyncopal episodes.  Denies any upper GI symptoms such as nausea, vomiting, poor appetite.  No weight loss.  Last colonoscopy apparently done in February 2019 by Dr. Orvil Feil revealed nonbleeding colonic diverticulosis as well as internal hemorrhoids.  The culprit of bleeding at that time was thought to be internal hemorrhoids. The patient appears to be hemodynamically stable with a hemoglobin of 12.  The patient says that the bleeding appears to be tapering off at this time and she continues to have no pain.  Past Medical History:  Past Medical History:  Diagnosis Date  . Arrhythmia    possible hx, resolved prev  . Blood transfusion without reported diagnosis 1972   had reaction; had to stop  . Carpal tunnel syndrome   . Cataract 2019   resolved with surgery  . Duodenal ulcer   . H/O exercise stress test 2012   normal  . Heart murmur    as child  . High cholesterol   . History of kidney stones   . Hx of colonic polyps   . Hypertension   . Osteoporosis 2010   t score - 3.9  . Renal stones     Problem List: Patient Active Problem List   Diagnosis Date Noted  . GI bleed 03/04/2018  . Healthcare maintenance 01/08/2018  . Paresthesia 01/08/2018  . Neck pain 08/31/2017  . Angioedema 06/15/2017  . Peptic ulcer of duodenum   . Other specified diseases of esophagus   . Hematochezia   . External hemorrhoids   . Diverticulosis of large intestine without diverticulitis   . Internal  hemorrhoids   . Duodenal ulceration   . Stomach irritation   . GIB (gastrointestinal bleeding) 02/28/2017  . Abnormal stools 02/27/2017  . Advance care planning 12/23/2016  . Kidney stone 04/20/2016  . Heartburn 12/17/2015  . Medicare annual wellness visit, subsequent 06/05/2013  . Hyperglycemia 06/05/2013  . Knee pain 08/31/2012  . Osteoporosis 08/18/2012  . HTN (hypertension) 08/09/2012  . HLD (hyperlipidemia) 08/09/2012    Past Surgical History: Past Surgical History:  Procedure Laterality Date  . ABDOMINAL HYSTERECTOMY  1972   for endometriosis  . CATARACT EXTRACTION W/ INTRAOCULAR LENS IMPLANT Right 06/2017  . CATARACT EXTRACTION W/ INTRAOCULAR LENS IMPLANT Left 07/2017  . COLONOSCOPY Left 03/02/2017   Procedure: COLONOSCOPY;  Surgeon: Virgel Manifold, MD;  Location: [facility] ENDOSCOPY;  Service: Endoscopy;  Laterality: Left;  . COLONOSCOPY WITH PROPOFOL N/A 05/04/2016   Procedure: COLONOSCOPY WITH PROPOFOL;  Surgeon: Jonathon Bellows, MD;  Location: [facility] ENDOSCOPY;  Service: Endoscopy;  Laterality: N/A;  . ESOPHAGOGASTRODUODENOSCOPY Left 03/02/2017   Procedure: ESOPHAGOGASTRODUODENOSCOPY (EGD);  Surgeon: Virgel Manifold, MD;  Location: [facility] ENDOSCOPY;  Service: Endoscopy;  Laterality: Left;  . ESOPHAGOGASTRODUODENOSCOPY (EGD) WITH PROPOFOL N/A 04/08/2017   Procedure: ESOPHAGOGASTRODUODENOSCOPY (EGD) WITH PROPOFOL;  Surgeon: Virgel Manifold, MD;  Location: [facility] ENDOSCOPY;  Service: Endoscopy;  Laterality: N/A;  . LITHOTRIPSY     for kidney stone x5  . LITHOTRIPSY  09/2017    Allergies: Allergies  Allergen Reactions  .  Ace Inhibitors Swelling  . Angiotensin Receptor Blockers     Would avoid, h/o swelling with ACE prev  . Apple Other (See Comments)    heartburn  . Aspirin Other (See Comments)    GI bleed  . Bisphosphonates     GI side effects GI side effects  . Nsaids     GI bleed  . Prolia [Denosumab]     GI upset GI upset    Home Medications: Medications  Prior to Admission  Medication Sig Dispense Refill Last Dose  . amLODipine (NORVASC) 2.5 MG tablet Take 1 tablet (2.5 mg total) by mouth daily. 90 tablet 3 unknonw at unknown  . cholecalciferol (VITAMIN D) 1000 units tablet Take 2 tablets (2,000 Units total) by mouth daily.   unknown at unknown  . simvastatin (ZOCOR) 10 MG tablet Take 1 tablet (10 mg total) by mouth every other day.   unknown at unknown   Home medication reconciliation was completed with the patient.   Scheduled Inpatient Medications:   . amLODipine  2.5 mg Oral Daily  . cholecalciferol  2,000 Units Oral Daily  . pantoprazole (PROTONIX) IV  40 mg Intravenous Q12H  . simvastatin  10 mg Oral QODAY  . sodium chloride flush  3 mL Intravenous Q12H    Continuous Inpatient Infusions:   . sodium chloride 100 mL/hr at 03/04/18 0717    PRN Inpatient Medications:  acetaminophen **OR** acetaminophen, ondansetron **OR** ondansetron (ZOFRAN) IV  Family History: family history includes Heart disease in her mother; Heart failure in her mother; Renal Disease in her mother.   GI Family History: Negative  Social History:   reports that she quit smoking about 27 years ago. Her smoking use included cigarettes. She has never used smokeless tobacco. She reports that she does not drink alcohol or use drugs. The patient denies ETOH, tobacco, or drug use.    Review of Systems: Review of Systems - General ROS: negative Psychological ROS: negative Ophthalmic ROS: negative ENT ROS: negative for - headaches, tinnitus or vertigo Allergy and Immunology ROS: negative for - latex, nasal congestion or postnasal drip Hematological and Lymphatic ROS: negative Respiratory ROS: no cough, shortness of breath, or wheezing Cardiovascular ROS: no chest pain or dyspnea on exertion Genito-Urinary ROS: no dysuria, trouble voiding, or hematuria Musculoskeletal ROS: negative Neurological ROS: no TIA or stroke symptoms Dermatological ROS:  negative  Physical Examination: BP (!) 148/72   Pulse 64   Temp 98.3 F (36.8 C) (Oral)   Resp 16   Ht 4\' 9"  (1.448 m)   Wt 69.6 kg   SpO2 100%   BMI 33.22 kg/m  Physical Exam Vitals signs and nursing note reviewed.  Constitutional:      General: She is not in acute distress.    Appearance: Normal appearance. She is not ill-appearing, toxic-appearing or diaphoretic.  HENT:     Head: Normocephalic and atraumatic.     Nose: Nose normal.  Eyes:     Extraocular Movements: Extraocular movements intact.     Pupils: Pupils are equal, round, and reactive to light.  Neck:     Musculoskeletal: Normal range of motion and neck supple.  Cardiovascular:     Rate and Rhythm: Normal rate and regular rhythm.     Pulses: Normal pulses.  Pulmonary:     Breath sounds: Normal breath sounds.  Abdominal:     General: Abdomen is flat.     Palpations: Abdomen is soft. There is no mass.     Tenderness: There  is no abdominal tenderness.     Hernia: No hernia is present.  Musculoskeletal:        General: No swelling, tenderness or signs of injury.  Skin:    General: Skin is warm and dry.     Capillary Refill: Capillary refill takes less than 2 seconds.  Neurological:     General: No focal deficit present.     Mental Status: She is alert.  Psychiatric:        Mood and Affect: Mood normal.        Behavior: Behavior normal.     Data: Lab Results  Component Value Date   WBC 9.5 03/04/2018   HGB 12.0 03/04/2018   HCT 37.4 03/04/2018   MCV 90.6 03/04/2018   PLT 253 03/04/2018   Recent Labs  Lab 03/04/18 0016 03/04/18 0314 03/04/18 0438  HGB 12.4 11.0* 12.0   Lab Results  Component Value Date   NA 142 03/04/2018   K 3.5 03/04/2018   CL 112 (H) 03/04/2018   CO2 26 03/04/2018   BUN 16 03/04/2018   CREATININE 0.46 03/04/2018   Lab Results  Component Value Date   ALT 11 03/04/2018   AST 18 03/04/2018   ALKPHOS 73 03/04/2018   BILITOT 0.5 03/04/2018   Recent Labs  Lab  03/04/18 0438  APTT 30  INR 0.98   CBC Latest Ref Rng & Units 03/04/2018 03/04/2018 03/04/2018  WBC 4.0 - 10.5 K/uL 9.5 - 11.3(H)  Hemoglobin 12.0 - 15.0 g/dL 12.0 11.0(L) 12.4  Hematocrit 36.0 - 46.0 % 37.4 34.4(L) 37.4  Platelets 150 - 400 K/uL 253 - 275    STUDIES: No results found. @IMAGES @  Assessment: 1.  Painless lower gastrointestinal bleeding, improving- possibly diverticular in origin.  Also consider hemorrhoidal bleeding given patient previous history of prolapsed hemorrhoids per history.  Recommendations: 1.  Proceed with liquid diet advance as tolerated. 2.  Serial H&H. 3.  Serial exams. 4.  If bleeding continues to resolve will most likely be able to be discharged tomorrow from a GI standpoint 5.  We will follow along.  Thank you for the consult. Please call with questions or concerns.  Olean Ree, "Lanny Hurst MD [facility] [place], [place] [address] [phone]  03/04/2018 11:25 AM

## 2018-03-04 NOTE — Progress Notes (Signed)
Admitted for rectal bleeding, hemoglobin stable around 11-12, patient has no abdominal pain, colonoscopy last year showed internal hemorrhoids, diverticula.  Patient has no abdominal pain, noted to have blood from stool since yesterday, colonoscopy reviewed from last year by Dr. Bonna Gains, has known history of hemorrhoids, patient now has is having more episodes of rectal bleed related to hemorrhoids, maybe she needs banding for hemorrhoids this time or as an out pt..  Will talk to gastroenterology to see if we can start her on clear liquids.  Patient is otherwise asymptomatic and wants to go home as she has upcoming party at  [employer].

## 2018-03-04 NOTE — ED Notes (Signed)
ED TO INPATIENT HANDOFF REPORT  Name/Age/Gender Patricia Hoover 73 y.o. female  Code Status Code Status History    Date Active Date Inactive Code Status Order ID Comments User Context   02/28/2017 2029 03/02/2017 1705 Full Code [id number]  Demetrios Loll, MD Inpatient      Home/SNF/Other Home  Chief Complaint Rectal Bleeding  Level of Care/Admitting Diagnosis ED Disposition    ED Disposition Condition [place]: [place] [[id number]]  Level of Care: Med-Surg [16]  Diagnosis: GI bleed [314970]  Admitting Physician: Sedalia Muta [[id number]]  Attending Physician: Sedalia Muta [[id number]]  PT Class (Do Not Modify): Observation [104]  PT Acc Code (Do Not Modify): Observation [10022]       Medical History Past Medical History:  Diagnosis Date  . Arrhythmia    possible hx, resolved prev  . Blood transfusion without reported diagnosis 1972   had reaction; had to stop  . Carpal tunnel syndrome   . Cataract 2019   resolved with surgery  . Duodenal ulcer   . H/O exercise stress test 2012   normal  . Heart murmur    as child  . High cholesterol   . History of kidney stones   . Hx of colonic polyps   . Hypertension   . Osteoporosis 2010   t score - 3.9  . Renal stones     Allergies Allergies  Allergen Reactions  . Ace Inhibitors Swelling  . Angiotensin Receptor Blockers     Would avoid, h/o swelling with ACE prev  . Apple Other (See Comments)    heartburn  . Aspirin Other (See Comments)    GI bleed  . Bisphosphonates     GI side effects GI side effects  . Nsaids     GI bleed  . Prolia [Denosumab]     GI upset GI upset    IV Location/Drains/Wounds Patient Lines/Drains/Airways Status   Active Line/Drains/Airways    Name:   Placement date:   Placement time:   Site:   Days:   Peripheral IV 03/04/18 Right Antecubital   03/04/18    0014    Antecubital   less than 1   Airway   03/02/17    0842     367           Labs/Imaging Results for orders placed or performed during the hospital encounter of 03/04/18 (from the past 48 hour(s))  CBC     Status: Abnormal   Collection Time: 03/04/18 12:16 AM  Result Value Ref Range   WBC 11.3 (H) 4.0 - 10.5 K/uL   RBC 4.23 3.87 - 5.11 MIL/uL   Hemoglobin 12.4 12.0 - 15.0 g/dL   HCT 37.4 36.0 - 46.0 %   MCV 88.4 80.0 - 100.0 fL   MCH 29.3 26.0 - 34.0 pg   MCHC 33.2 30.0 - 36.0 g/dL   RDW 13.3 11.5 - 15.5 %   Platelets 275 150 - 400 K/uL   nRBC 0.0 0.0 - 0.2 %    Comment: Performed at [facility], [address] [place], [place] [address]  Comprehensive metabolic panel     Status: Abnormal   Collection Time: 03/04/18  1:00 AM  Result Value Ref Range   Sodium 141 135 - 145 mmol/L   Potassium 3.9 3.5 - 5.1 mmol/L    Comment: HEMOLYSIS AT THIS LEVEL MAY AFFECT RESULT   Chloride 112 (H) 98 - 111 mmol/L   CO2 24 22 -  32 mmol/L   Glucose, Bld 134 (H) 70 - 99 mg/dL   BUN 17 8 - 23 mg/dL   Creatinine, Ser 0.48 0.44 - 1.00 mg/dL   Calcium 8.4 (L) 8.9 - 10.3 mg/dL   Total Protein 6.2 (L) 6.5 - 8.1 g/dL   Albumin 3.5 3.5 - 5.0 g/dL   AST 18 15 - 41 U/L   ALT 11 0 - 44 U/L   Alkaline Phosphatase 73 38 - 126 U/L   Total Bilirubin 0.5 0.3 - 1.2 mg/dL   GFR calc non Af Amer >60 >60 mL/min   GFR calc Af Amer >60 >60 mL/min   Anion gap 5 5 - 15    Comment: Performed at [facility], [address] [place], [place] [address]  Type and screen Ordered by PROVIDER DEFAULT     Status: None   Collection Time: 03/04/18  1:53 AM  Result Value Ref Range   ABO/RH(D) O POS    Antibody Screen NEG    Sample Expiration      03/07/2018 Performed at [facility], [address] [place], [place] [address]   Hemoglobin and hematocrit, blood     Status: Abnormal   Collection Time: 03/04/18  3:14 AM  Result Value Ref Range   Hemoglobin 11.0 (L) 12.0 - 15.0 g/dL   HCT 34.4 (L) 36.0 - 46.0 %    Comment: Performed at [facility],  [address] [place], [place] [address]   No results found.  Pending Labs [employer] (From admission, onward)    Start     Ordered   Signed and Occupational hygienist morning,   R     Signed and Held   Signed and Held  CBC  Tomorrow morning,   R     Signed and Held   Signed and Held  Protime-INR  Tomorrow morning,   R     Signed and Held   Signed and Held  APTT  Tomorrow morning,   R     Signed and Held          Vitals/Pain Today's Vitals   03/04/18 0041 03/04/18 0131 03/04/18 0214 03/04/18 0331  BP: (!) 151/68 (!) 141/103 (!) 140/104 136/85  Pulse: 68 60 (!) 58 62  Resp: 16 16 16 16   Temp:      TempSrc:      SpO2: 99% 100% 100% 100%  Weight:      Height:      PainSc:        Isolation Precautions No active isolations  Medications Medications  sodium chloride 0.9 % bolus 1,000 mL (0 mLs Intravenous Stopped 03/04/18 0406)    Mobility walks

## 2018-03-04 NOTE — Care Management Obs Status (Signed)
[place] NOTIFICATION   Patient Details  Name: Patricia Hoover MRN: [id number] Date of Birth: 26-Jan-1945   Medicare Observation Status Notification Given:  Yes    Clemence Stillings A Tanieka Pownall, RN 03/04/2018, 2:47 PM

## 2018-03-05 DIAGNOSIS — I10 Essential (primary) hypertension: Secondary | ICD-10-CM | POA: Diagnosis not present

## 2018-03-05 DIAGNOSIS — K625 Hemorrhage of anus and rectum: Secondary | ICD-10-CM | POA: Diagnosis not present

## 2018-03-05 DIAGNOSIS — K922 Gastrointestinal hemorrhage, unspecified: Secondary | ICD-10-CM | POA: Diagnosis not present

## 2018-03-05 LAB — CBC
HCT: 35.5 % — ABNORMAL LOW (ref 36.0–46.0)
Hemoglobin: 11.5 g/dL — ABNORMAL LOW (ref 12.0–15.0)
MCH: 29.2 pg (ref 26.0–34.0)
MCHC: 32.4 g/dL (ref 30.0–36.0)
MCV: 90.1 fL (ref 80.0–100.0)
Platelets: 252 10*3/uL (ref 150–400)
RBC: 3.94 MIL/uL (ref 3.87–5.11)
RDW: 13.2 % (ref 11.5–15.5)
WBC: 8 10*3/uL (ref 4.0–10.5)
nRBC: 0 % (ref 0.0–0.2)

## 2018-03-05 LAB — GLUCOSE, CAPILLARY: Glucose-Capillary: 75 mg/dL (ref 70–99)

## 2018-03-05 NOTE — Progress Notes (Signed)
     Patricia Hoover was admitted to the Hospital on 03/04/2018 and Discharged  03/05/2018 .  Her daughter [name] needed to be in the hospital for [name],so please excuse [name] from work from February 15th to  February 16. for 2 days starting 03/04/2018 , may return to work/school without any restrictions.    Epifanio Lesches M.D on 03/05/2018,at 11:44 AM

## 2018-03-05 NOTE — Progress Notes (Signed)
Patient's hemoglobin this morning 11.5, she denies any abdominal pain, wants to go home. discharge instructions are in the computer, can continue BP medicines, follow-up with gastroenterology for possible hemorrhoid banding.  Discussed the results with patient.

## 2018-03-06 ENCOUNTER — Telehealth: Payer: Self-pay

## 2018-03-06 NOTE — Telephone Encounter (Signed)
Transition Care Management Follow-up Telephone Call   Date discharged? 03/04/2018-03/05/2018  Diagnosis: Rectal bleeding   How have you been since you were released from the hospital? "I feel great!"   Do you understand why you were in the hospital? yes, "I believe it's my diverticulosis". Pt reports eating popcorn and kiwi prior to rectal bleeding.    Do you understand the discharge instructions? yes   Where were you discharged to? Home.    Items Reviewed:  Medications reviewed: yes  Allergies reviewed: yes  Dietary changes reviewed: yes, discussed foods to avoid  Referrals reviewed: yes   Functional Questionnaire:   Activities of Daily Living (ADLs):   She states they are independent in the following: ambulation, bathing and hygiene, feeding, continence, grooming, toileting and dressing States they require assistance with the following: None.    Any transportation issues/concerns?: no   Any patient concerns? no   Confirmed importance and date/time of follow-up visits scheduled yes  Provider Appointment booked with PCP 03/13/2018  Confirmed with patient if condition begins to worsen call PCP or go to the ER.  Patient was given the office number and encouraged to call back with question or concerns.  : yes

## 2018-03-09 NOTE — Discharge Summary (Signed)
Patricia Hoover, is a 73 y.o. female  DOB [birth year]  MRN [id number].  Admission date:  03/04/2018  Admitting Physician  Sedalia Muta, MD  Discharge Date:  03/05/2018   Primary MD  Tonia Ghent, MD  Recommendations for primary care physician for things to follow:     Admission Diagnosis  Acute GI bleeding [K92.2]   Discharge Diagnosis  Acute GI bleeding [K92.2]    Active Problems:   GI bleed      Past Medical History:  Diagnosis Date  . Arrhythmia    possible hx, resolved prev  . Blood transfusion without reported diagnosis 1972   had reaction; had to stop  . Carpal tunnel syndrome   . Cataract 2019   resolved with surgery  . Duodenal ulcer   . H/O exercise stress test 2012   normal  . Heart murmur    as child  . High cholesterol   . History of kidney stones   . Hx of colonic polyps   . Hypertension   . Osteoporosis 2010   t score - 3.9  . Renal stones     Past Surgical History:  Procedure Laterality Date  . ABDOMINAL HYSTERECTOMY  1972   for endometriosis  . CATARACT EXTRACTION W/ INTRAOCULAR LENS IMPLANT Right 06/2017  . CATARACT EXTRACTION W/ INTRAOCULAR LENS IMPLANT Left 07/2017  . COLONOSCOPY Left 03/02/2017   Procedure: COLONOSCOPY;  Surgeon: Virgel Manifold, MD;  Location: [facility] ENDOSCOPY;  Service: Endoscopy;  Laterality: Left;  . COLONOSCOPY WITH PROPOFOL N/A 05/04/2016   Procedure: COLONOSCOPY WITH PROPOFOL;  Surgeon: Jonathon Bellows, MD;  Location: [facility] ENDOSCOPY;  Service: Endoscopy;  Laterality: N/A;  . ESOPHAGOGASTRODUODENOSCOPY Left 03/02/2017   Procedure: ESOPHAGOGASTRODUODENOSCOPY (EGD);  Surgeon: Virgel Manifold, MD;  Location: [facility] ENDOSCOPY;  Service: Endoscopy;  Laterality: Left;  . ESOPHAGOGASTRODUODENOSCOPY (EGD) WITH PROPOFOL N/A 04/08/2017   Procedure:  ESOPHAGOGASTRODUODENOSCOPY (EGD) WITH PROPOFOL;  Surgeon: Virgel Manifold, MD;  Location: [facility] ENDOSCOPY;  Service: Endoscopy;  Laterality: N/A;  . LITHOTRIPSY     for kidney stone x5  . LITHOTRIPSY  09/2017       History of present illness and  Hospital Course:     Kindly see H&P for history of present illness and admission details, please review complete Labs, Consult reports and Test reports for all details in brief  HPI  from the history and physical done on the day of admission 73 year old woman patient is admitted for rectal bleeding. Hospital Course  #1 acute onset of l lower GI bleed,, admit to medical floor, started on IV hydration, seen by gastroenterology, patient bowel movement, had colonoscopy last year which showed internal hemorrhoids, patient also has history of prolapsed hemorrhoids.  Gastroenterologist recommended that she follow-up with outpatient for possible banding of hemorrhoids.  Patient felt much better the next day . Hemoglobin stayed stable at 11.5, discharged home in stable condition.  Thought to be secondary to hemorrhoid bleeding, patient advised to follow-up with gastroenterology as an outpatient, for possible banding of internal hemorrhoids as an outpatient.   Discharge Condition: Stable   Follow UP  Follow-up Information    Tonia Ghent, MD. Schedule an appointment as soon as possible for a visit.   Specialty:  Family Medicine Contact information: [place] [address] [phone]        Efrain Sella, MD. Schedule an appointment as soon as possible for a visit in 1 week(s).   Specialty:  Gastroenterology Contact information:  [place] [address] [phone]             Discharge Instructions  and  Discharge Medications    Allergies as of 03/05/2018      Reactions   Ace Inhibitors Swelling   Angiotensin Receptor Blockers    Would avoid, h/o swelling with ACE prev    Apple Other (See Comments)   heartburn   Aspirin Other (See Comments)   GI bleed   Bisphosphonates    GI side effects GI side effects   Nsaids    GI bleed   Prolia [denosumab]    GI upset GI upset      Medication List    TAKE these medications   amLODipine 2.5 MG tablet Commonly known as:  NORVASC Take 1 tablet (2.5 mg total) by mouth daily.   cholecalciferol 1000 units tablet Commonly known as:  VITAMIN D Take 2 tablets (2,000 Units total) by mouth daily.   simvastatin 10 MG tablet Commonly known as:  ZOCOR Take 1 tablet (10 mg total) by mouth every other day.         Diet and Activity recommendation: See Discharge Instructions above   Consults obtained -gastroenterology   Major procedures and Radiology Reports - PLEASE review detailed and final reports for all details, in brief -     No results found.  Micro Results   No results found for this or any previous visit (from the past 240 hour(s)).     Today   Subjective:   Myeesha Shane today has no headache,no chest abdominal pain,no new weakness tingling or numbness, feels much better wants to go home today.   Objective:   Blood pressure (!) 170/62, pulse 68, temperature 98.3 F (36.8 C), temperature source Oral, resp. rate 18, height 4\' 9"  (1.448 m), weight 69.3 kg, SpO2 100 %.  No intake or output data in the 24 hours ending 03/09/18 1754  Exam Awake Alert, Oriented x 3, No new F.N deficits, Normal affect [place].AT,PERRAL Supple Neck,No JVD, No cervical lymphadenopathy appriciated.  Symmetrical Chest wall movement, Good air movement bilaterally, CTAB RRR,No Gallops,Rubs or new Murmurs, No Parasternal Heave +ve B.Sounds, Abd Soft, Non tender, No organomegaly appriciated, No rebound -guarding or rigidity. No Cyanosis, Clubbing or edema, No new Rash or bruise  Data Review   CBC w Diff:  Lab Results  Component Value Date   WBC 8.0 03/05/2018   HGB 11.5 (L) 03/05/2018   HCT 35.5 (L) 03/05/2018    PLT 252 03/05/2018   LYMPHOPCT 27.4 03/15/2017   MONOPCT 5.6 03/15/2017   EOSPCT 1.3 03/15/2017   BASOPCT 0.7 03/15/2017    CMP:  Lab Results  Component Value Date   NA 142 03/04/2018   K 3.5 03/04/2018   CL 112 (H) 03/04/2018   CO2 26 03/04/2018   BUN 16 03/04/2018   BUN 12 07/06/2011   CREATININE 0.46 03/04/2018   CREATININE 0.68 01/31/2014   PROT 6.2 (L) 03/04/2018   ALBUMIN 3.5 03/04/2018   BILITOT 0.5 03/04/2018   ALKPHOS 73 03/04/2018   AST 18 03/04/2018   AST 12 07/06/2011   ALT 11 03/04/2018  .   Total Time in preparing paper work, data evaluation and todays exam - 35 minutes  Epifanio Lesches M.D on 03/05/2018 at 5:54 PM    Note: This dictation was prepared with Dragon dictation along with smaller phrase technology. Any transcriptional errors that result from this process are unintentional.

## 2018-03-13 ENCOUNTER — Ambulatory Visit (INDEPENDENT_AMBULATORY_CARE_PROVIDER_SITE_OTHER): Payer: Medicare Other | Admitting: Family Medicine

## 2018-03-13 ENCOUNTER — Encounter: Payer: Self-pay | Admitting: Family Medicine

## 2018-03-13 VITALS — BP 142/70 | HR 71 | Temp 98.4°F | Ht <= 58 in | Wt 154.1 lb

## 2018-03-13 DIAGNOSIS — K922 Gastrointestinal hemorrhage, unspecified: Secondary | ICD-10-CM | POA: Diagnosis not present

## 2018-03-13 NOTE — Patient Instructions (Signed)
Keep the appointment with GI.  Go to the lab on the way out.  We'll contact you with your lab report. Take care.  Glad to see you.

## 2018-03-13 NOTE — Progress Notes (Signed)
Admission date:  03/04/2018  Admitting Physician  Sedalia Muta, MD  Discharge Date:  03/05/2018   Primary MD  Tonia Ghent, MD  Recommendations for primary care physician for things to follow:   Admission Diagnosis  Acute GI bleeding [K92.2]  Discharge Diagnosis  Acute GI bleeding [K92.2]    Active Problems:   GI bleed         Past Medical History:  Diagnosis Date  . Arrhythmia    possible hx, resolved prev  . Blood transfusion without reported diagnosis 1972   had reaction; had to stop  . Carpal tunnel syndrome   . Cataract 2019   resolved with surgery  . Duodenal ulcer   . H/O exercise stress test 2012   normal  . Heart murmur    as child  . High cholesterol   . History of kidney stones   . Hx of colonic polyps   . Hypertension   . Osteoporosis 2010   t score - 3.9  . Renal stones          Past Surgical History:  Procedure Laterality Date  . ABDOMINAL HYSTERECTOMY  1972   for endometriosis  . CATARACT EXTRACTION W/ INTRAOCULAR LENS IMPLANT Right 06/2017  . CATARACT EXTRACTION W/ INTRAOCULAR LENS IMPLANT Left 07/2017  . COLONOSCOPY Left 03/02/2017   Procedure: COLONOSCOPY;  Surgeon: Virgel Manifold, MD;  Location: [facility] ENDOSCOPY;  Service: Endoscopy;  Laterality: Left;  . COLONOSCOPY WITH PROPOFOL N/A 05/04/2016   Procedure: COLONOSCOPY WITH PROPOFOL;  Surgeon: Jonathon Bellows, MD;  Location: [facility] ENDOSCOPY;  Service: Endoscopy;  Laterality: N/A;  . ESOPHAGOGASTRODUODENOSCOPY Left 03/02/2017   Procedure: ESOPHAGOGASTRODUODENOSCOPY (EGD);  Surgeon: Virgel Manifold, MD;  Location: [facility] ENDOSCOPY;  Service: Endoscopy;  Laterality: Left;  . ESOPHAGOGASTRODUODENOSCOPY (EGD) WITH PROPOFOL N/A 04/08/2017   Procedure: ESOPHAGOGASTRODUODENOSCOPY (EGD) WITH PROPOFOL;  Surgeon: Virgel Manifold, MD;  Location: [facility] ENDOSCOPY;  Service: Endoscopy;  Laterality: N/A;  . LITHOTRIPSY     for kidney stone x5  . LITHOTRIPSY  09/2017     History of present illness and  Hospital Course:     Kindly see H&P for history of present illness and admission details, please review complete Labs, Consult reports and Test reports for all details in brief  HPI  from the history and physical done on the day of admission 73 year old woman patient is admitted for rectal bleeding. Hospital Course  #1 acute onset of l lower GI bleed,, admit to medical floor, started on IV hydration, seen by gastroenterology, patient bowel movement, had colonoscopy last year which showed internal hemorrhoids, patient also has history of prolapsed hemorrhoids.  Gastroenterologist recommended that she follow-up with outpatient for possible banding of hemorrhoids.  Patient felt much better the next day . Hemoglobin stayed stable at 11.5, discharged home in stable condition.  Thought to be secondary to hemorrhoid bleeding, patient advised to follow-up with gastroenterology as an outpatient, for possible banding of internal hemorrhoids as an outpatient.  Discharge Condition: Stable ==================================  Was at her baseline level of health then had BRBPR.  No pain.  She never felt poorly.  No FCNAVD.  No abd pain.  No CP, not SOB.  She was admitted and observed.  Inpatient course discussed with patient.  She improved to the point where she could be discharged home.  She has GI f/u pending. F/u labs pending.  Her prev noted hemorrhoid feels like it decompressed.    D/w pt about prev EGD and colonoscopy.   No  nsaids or ASA.    Not lightheaded on standing.  No abdominal pain.  No bleeding.  PMH and SH reviewed  ROS: Per HPI unless specifically indicated in ROS section   Meds, vitals, and allergies reviewed.   GEN: nad, alert and oriented HEENT: mucous membranes moist NECK: supple w/o LA CV: rrr. PULM: ctab, no inc wob ABD: soft, +bs EXT: no edema SKIN: no acute rash

## 2018-03-14 LAB — CBC WITH DIFFERENTIAL/PLATELET
BASOS PCT: 0.8 % (ref 0.0–3.0)
Basophils Absolute: 0.1 10*3/uL (ref 0.0–0.1)
EOS PCT: 1.9 % (ref 0.0–5.0)
Eosinophils Absolute: 0.2 10*3/uL (ref 0.0–0.7)
HCT: 36.7 % (ref 36.0–46.0)
Hemoglobin: 12.2 g/dL (ref 12.0–15.0)
Lymphocytes Relative: 30 % (ref 12.0–46.0)
Lymphs Abs: 3.6 10*3/uL (ref 0.7–4.0)
MCHC: 33.3 g/dL (ref 30.0–36.0)
MCV: 88.6 fl (ref 78.0–100.0)
Monocytes Absolute: 0.8 10*3/uL (ref 0.1–1.0)
Monocytes Relative: 7.1 % (ref 3.0–12.0)
NEUTROS ABS: 7.2 10*3/uL (ref 1.4–7.7)
Neutrophils Relative %: 60.2 % (ref 43.0–77.0)
Platelets: 283 10*3/uL (ref 150.0–400.0)
RBC: 4.14 Mil/uL (ref 3.87–5.11)
RDW: 13.5 % (ref 11.5–15.5)
WBC: 11.9 10*3/uL — ABNORMAL HIGH (ref 4.0–10.5)

## 2018-03-14 LAB — IRON: Iron: 40 ug/dL — ABNORMAL LOW (ref 42–145)

## 2018-03-15 ENCOUNTER — Encounter: Payer: Self-pay | Admitting: Gastroenterology

## 2018-03-15 ENCOUNTER — Ambulatory Visit (INDEPENDENT_AMBULATORY_CARE_PROVIDER_SITE_OTHER): Payer: Medicare Other | Admitting: Gastroenterology

## 2018-03-15 VITALS — BP 165/89 | HR 71 | Ht <= 58 in | Wt 152.6 lb

## 2018-03-15 DIAGNOSIS — K648 Other hemorrhoids: Secondary | ICD-10-CM | POA: Diagnosis not present

## 2018-03-15 NOTE — Progress Notes (Signed)
[name], MD [address]  [place]  [place], [place] [address]  Main: [phone]  Fax: [phone]   Primary Care Physician: Tonia Ghent, MD   Chief Complaint  Patient presents with  . Follow-up    ED GI bleed    HPI: Patricia Hoover is a 73 y.o. female here for posthospitalization follow-up.  Patient recently admitted with hematochezia which self resolved without any interventions.  Patient was seen by Dr. Alice Reichert as an inpatient and symptoms were thought to be due to underlying hemorrhoids.  Patient denies any further bleeding since hospital discharge.  Thinks that prior to the hospital admission she may have wiped too hard as she states "I have a heavy hand". The patient denies abdominal or flank pain, anorexia, nausea or vomiting, dysphagia, change in bowel habits or black or bloody stools or weight loss.  Last EGD in March 2019 for follow-up of duodenal ulcer Findings: The examined esophagus was normal. The Z-line was irregular and was found 38 cm from the incisors. As per  recent guidelines, an irregular Z line does not need biopsies, unless  greater than 1 cm tongues of salmon colored tongues are seen, which is  not present here. Patchy mildly erythematous mucosa without bleeding was found in the  gastric antrum. Biopsies were taken with a cold forceps for histology.  Biopsies were obtained in the gastric body, at the incisura and in the  gastric antrum with cold forceps for histology. A mild post-ulcer deformity was found in the duodenal bulb. The  deformity was characterized by mild angulation at the area which was  able to be easily traversed with the endoscope. No other deformities  present. Biopsies were taken with a cold forceps for histology. There is no endoscopic evidence of ulceration in the entire examined  duodenum. The duodenal bulb, second portion of the  duodenum and examined duodenum  were normal othwerwise. Impression: - Normal esophagus. - Z-line irregular, 38 cm from the incisors. - Erythematous mucosa in the antrum. Biopsied. - Duodenal deformity (characterized by mild angulation  at healed ulcer site). Biopsied. - Normal duodenal bulb, second portion of the duodenum  and examined duodenum. - Biopsies were obtained in the gastric body, at the  incisura and in the gastric antrum.   Surgical Pathology  CASE: [id number]  PATIENT: Patricia Hoover  Surgical Pathology Report      SPECIMEN SUBMITTED:  A. Duodenum bulb, previous ulcer site; cbx  B. Stomach, antrum, body, incisura, hx of h pylori; cbx   CLINICAL HISTORY:  None provided   PRE-OPERATIVE DIAGNOSIS:  Duodenal ulcer   POST-OPERATIVE DIAGNOSIS:  Gastric erythema, healed duodenal ulcer      DIAGNOSIS:  A. 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  - NEGATIVE FOR INTRA-EPITHELIAL LYMPHOCYTOSIS, DYSPLASIA AND MALIGNANCY.   B. STOMACH, ANTRUM, BODY AND INCISURA; COLD BIOPSY:  - ANTRAL MUCOSA WITH MILD CHRONIC GASTRITIS AND FOCAL MILD ACTIVITY.  - OXYNTIC MUCOSA WITH MILD CHRONIC GASTRITIS.  - NEGATIVE FOR H. PYLORI, DYSPLASIA AND MALIGNANCY.        Previous history: initially seen on March 01, 2017 when she was admitted for hematochezia. She underwent EGD on March 02, 2017 that showed a 4 mm nonbleeding duodenal bulb ulcer that prevented scope advancement of the second portion. However, patient was not have any obstructive symptoms. Gastric mucosal atrophy was seen. Biopsies were positive for H. pylori and.patient was prescribed triple therapy.Colonoscopy done March 02, 2017 showed  nonbleeding diverticulosis, internal hemorrhoids, extent of exam terminal ileum. Her hematochezia was thought to be due to the hemorrhoids as her hemoglobin did not drop far below her baseline, and was 11.8 at its lowest.  Patient's last colonoscopy was in April 2018, and it was done to follow-up for rectal bleeding just prior to the procedure. The colonoscopy was done by Dr. Vicente Males, and showed 2 small polyps that were removed, nonbleeding internal hemorrhoids, and diverticulosis. Pathology report showed tubular adenoma and hyperplastic polyp. She had a colonoscopy in August 2016 by Dr. Elmo Putt, diverticulosis was seen, and one small polyp was removed at that time. Polyp was tubular adenoma.   Current Outpatient Medications  Medication Sig Dispense Refill  . amLODipine (NORVASC) 2.5 MG tablet Take 1 tablet (2.5 mg total) by mouth daily. 90 tablet 3  . cholecalciferol (VITAMIN D) 1000 units tablet Take 2 tablets (2,000 Units total) by mouth daily.    . simvastatin (ZOCOR) 10 MG tablet Take 1 tablet (10 mg total) by mouth every other day.     No current facility-administered medications for this visit.     Allergies as of 03/15/2018 - Review Complete 03/15/2018  Allergen Reaction Noted  . Ace inhibitors Swelling 06/13/2017  . Angiotensin receptor blockers  06/14/2017  . Apple Other (See Comments) 10/25/2014  . Aspirin Other (See Comments) 03/15/2017  . Bisphosphonates  08/18/2012  . Nsaids  03/15/2017  . Prolia [denosumab]  06/21/2013    ROS:  General: Negative for anorexia, weight loss, fever, chills, fatigue, weakness. ENT: Negative for hoarseness, difficulty swallowing , nasal congestion. CV: Negative for chest pain, angina, palpitations, dyspnea on exertion, peripheral edema.  Respiratory: Negative for dyspnea at rest, dyspnea on exertion, cough, sputum, wheezing.  GI: See history of present illness. GU:  Negative for dysuria, hematuria, urinary incontinence, urinary frequency,  nocturnal urination.  Endo: Negative for unusual weight change.    Physical Examination:   BP (!) 165/89   Pulse 71   Ht 4\' 9"  (1.448 m)   Wt 152 lb 9.6 oz (69.2 kg)   BMI 33.02 kg/m   General: Well-nourished, well-developed in no acute distress.  Eyes: No icterus. Conjunctivae pink. Mouth: Oropharyngeal mucosa moist and pink , no lesions erythema or exudate. Neck: Supple, Trachea midline Abdomen: Bowel sounds are normal, nontender, nondistended, no hepatosplenomegaly or masses, no abdominal bruits or hernia , no rebound or guarding.   Extremities: No lower extremity edema. No clubbing or deformities. Neuro: Alert and oriented x 3.  Grossly intact. Skin: Warm and dry, no jaundice.   Psych: Alert and cooperative, normal mood and affect.   Labs: CMP     Component Value Date/Time   NA 142 03/04/2018 0438   K 3.5 03/04/2018 0438   CL 112 (H) 03/04/2018 0438   CO2 26 03/04/2018 0438   GLUCOSE 114 (H) 03/04/2018 0438   BUN 16 03/04/2018 0438   BUN 12 07/06/2011   CREATININE 0.46 03/04/2018 0438   CREATININE 0.68 01/31/2014 1652   CALCIUM 8.4 (L) 03/04/2018 0438   PROT 6.2 (L) 03/04/2018 0100   ALBUMIN 3.5 03/04/2018 0100   AST 18 03/04/2018 0100   AST 12 07/06/2011   ALT 11 03/04/2018 0100   ALKPHOS 73 03/04/2018 0100   BILITOT 0.5 03/04/2018 0100   GFRNONAA >60 03/04/2018 0438   GFRAA >60 03/04/2018 0438   Lab Results  Component Value Date   WBC 11.9 (H) 03/13/2018   HGB 12.2 03/13/2018   HCT 36.7 03/13/2018  MCV 88.6 03/13/2018   PLT 283.0 03/13/2018    Imaging Studies: No results found.  Assessment and Plan:   Krystol Rocco is a 73 y.o. y/o female here for posthospitalization follow-up for hematochezia  Patient symptoms during that hospitalization, most consistent with her known internal hemorrhoids No further active bleeding  However, given that she has had more than one episode of bleeding from internal hemorrhoids, requiring hospital admission, I will  refer her to Dr. Marius Ditch for internal hemorrhoid banding  Patient reports one soft to formed bowel movement daily She was advised not to irritate the area, use wet wipes or water as needed for cleaning as she states she thinks she cleans too much and irritates her hemorrhoids.  Continue high-fiber diet  No indication for repeat colonoscopy at this time given her recent colonoscopies for the last 1 to 2 years.    Dr [name]

## 2018-03-16 ENCOUNTER — Encounter: Payer: Self-pay | Admitting: Family Medicine

## 2018-03-16 NOTE — Assessment & Plan Note (Signed)
See above.  Still okay for outpatient follow-up.  Check routine labs today.  See notes on labs.  Update me as needed.  Routine cautions given.  All questions answered to the best my ability.  I appreciate the help of all involved >25 minutes spent in face to face time with patient, >50% spent in counselling or coordination of care.

## 2018-03-22 ENCOUNTER — Telehealth: Payer: Self-pay

## 2018-03-22 NOTE — Telephone Encounter (Signed)
Pt called and request refill amlodipine; [facility]. Pt will contact pharmacy and will call [facility] if needed.

## 2018-03-31 ENCOUNTER — Encounter: Payer: Self-pay | Admitting: Gastroenterology

## 2018-03-31 ENCOUNTER — Ambulatory Visit: Payer: Medicare Other | Admitting: Gastroenterology

## 2018-04-14 ENCOUNTER — Ambulatory Visit: Payer: Medicare Other | Admitting: Gastroenterology

## 2018-05-31 ENCOUNTER — Ambulatory Visit: Payer: Medicare Other | Admitting: Gastroenterology

## 2018-06-27 ENCOUNTER — Telehealth: Payer: Self-pay | Admitting: Family Medicine

## 2018-06-27 NOTE — Telephone Encounter (Signed)
Patient called asking how often she can have her A1c checked and the length of time between each check.

## 2018-06-27 NOTE — Telephone Encounter (Signed)
please review

## 2018-06-28 ENCOUNTER — Other Ambulatory Visit: Payer: Self-pay | Admitting: Family Medicine

## 2018-06-28 DIAGNOSIS — R739 Hyperglycemia, unspecified: Secondary | ICD-10-CM

## 2018-06-28 NOTE — Telephone Encounter (Signed)
LM for patient with detailed instructions.  She is to call back and schedule follow up A1C and DM follow up as she desires now or to wait until yearly physical later this year.

## 2018-06-28 NOTE — Telephone Encounter (Signed)
Since she has mild hyperglycemia but is not to the point of being diabetic, I would not check her A1c frequently.  We could do it every 6 months if she desired.  Otherwise we can do it yearly at a physical.  It was last done in December.  If she wants to get this set up for the near future then please get her a lab appointment with a follow-up with visit.  Thanks.

## 2018-06-30 NOTE — Telephone Encounter (Signed)
Spoke with patient. She will check with her insruanceto see if A1C will be covered. I gave her diagnoses code we would use. Patient will call us back if she wants to get this done prior to her appointment for physical in December

## 2018-06-30 NOTE — Telephone Encounter (Signed)
Left message for patient to call back to see how she wanted to proceed for sure

## 2018-07-06 DIAGNOSIS — N200 Calculus of kidney: Secondary | ICD-10-CM | POA: Diagnosis not present

## 2018-07-06 DIAGNOSIS — N201 Calculus of ureter: Secondary | ICD-10-CM | POA: Diagnosis not present

## 2018-07-12 ENCOUNTER — Other Ambulatory Visit: Payer: Self-pay | Admitting: *Deleted

## 2018-07-12 DIAGNOSIS — Z20822 Contact with and (suspected) exposure to covid-19: Secondary | ICD-10-CM

## 2018-07-16 LAB — NOVEL CORONAVIRUS, NAA: SARS-CoV-2, NAA: NOT DETECTED

## 2018-07-28 ENCOUNTER — Ambulatory Visit: Payer: Medicare Other | Admitting: Gastroenterology

## 2018-08-11 ENCOUNTER — Other Ambulatory Visit: Payer: Self-pay

## 2018-08-11 ENCOUNTER — Encounter: Payer: Self-pay | Admitting: Gastroenterology

## 2018-08-11 ENCOUNTER — Ambulatory Visit (INDEPENDENT_AMBULATORY_CARE_PROVIDER_SITE_OTHER): Payer: Medicare Other | Admitting: Gastroenterology

## 2018-08-11 VITALS — BP 159/81 | HR 65 | Temp 99.0°F | Ht <= 58 in | Wt 155.0 lb

## 2018-08-11 DIAGNOSIS — K642 Third degree hemorrhoids: Secondary | ICD-10-CM

## 2018-08-11 NOTE — Progress Notes (Signed)
PROCEDURE NOTE: The patient presents with symptomatic grade 3 hemorrhoids, unresponsive to maximal medical therapy, requesting rubber band ligation of his/her hemorrhoidal disease.  All risks, benefits and alternative forms of therapy were described and informed consent was obtained.  In the Left Lateral Decubitus position (if anoscopy is performed) anoscopic examination revealed grade 3 hemorrhoids in the RA position(s).   The decision was made to band the RA internal hemorrhoid, and the [place] was used to perform band ligation without complication.  Digital anorectal examination was then performed to assure proper positioning of the band, and to adjust the banded tissue as required.  The patient was discharged home without pain or other issues.  Dietary and behavioral recommendations were given and (if necessary - prescriptions were given), along with follow-up instructions.  The patient will return 2 weeks for follow-up and possible additional banding as required.  No complications were encountered and the patient tolerated the procedure well.

## 2018-08-11 NOTE — Progress Notes (Signed)
Cephas Darby, MD [address]  [place]  [place], [place] [address]  Main: [phone]  Fax: [phone] Pager: [phone]   Primary Care Physician: Tonia Ghent, MD  Primary Gastroenterologist:  Dr. Bonna Gains  Chief Complaint  Patient presents with  . Hemorrhoids    Banding #1    HPI: Patricia Hoover is a 73 y.o. female with history of intermittent rectal bleeding secondary to internal hemorrhoids.  Her main concern is stool leakage even though she does not have a bowel movement.  She was admitted to [facility] in 02/2018 secondary to painless rectal bleeding after bowel movement.  This was thought to be secondary to bleeding from internal hemorrhoids.  This was a self-limited episode.  Patient underwent colonoscopy twice within last 5 years which revealed only internal hemorrhoids, and a subcentimeter tubular adenoma in ascending colon.  Therefore, patient is referred to me for hemorrhoid ligation Patient's main concern today is about stool leakage that she has to wear depends.  She did not have further episodes of rectal bleeding since last hospitalization.  She denies multiple pregnancies, prolonged labor, perineal tears.  She reports her stools are regular, formed.  She denies any other GI symptoms.  Patient is accompanied by her daughter today  Current Outpatient Medications  Medication Sig Dispense Refill  . amLODipine (NORVASC) 2.5 MG tablet Take 1 tablet (2.5 mg total) by mouth daily. 90 tablet 3  . aspirin EC 81 MG tablet Take by mouth.    . cholecalciferol (VITAMIN D) 1000 units tablet Take 2 tablets (2,000 Units total) by mouth daily.    Marland Kitchen lisinopril-hydrochlorothiazide (ZESTORETIC) 10-12.5 MG tablet     . pantoprazole (PROTONIX) 20 MG tablet Take by mouth.    . simvastatin (ZOCOR) 10 MG tablet Take 1 tablet (10 mg total) by mouth every other day.    . tamsulosin (FLOMAX) 0.4 MG CAPS capsule      No current facility-administered medications for this visit.      Allergies as of 08/11/2018 - Review Complete 08/11/2018  Allergen Reaction Noted  . Ace inhibitors Swelling 06/13/2017  . Angiotensin receptor blockers  06/14/2017  . Apple Other (See Comments) 10/25/2014  . Aspirin Other (See Comments) 03/15/2017  . Bisphosphonates  08/18/2012  . Nsaids  03/15/2017  . Prolia [denosumab]  06/21/2013    NSAIDs: None  Antiplts/Anticoagulants/Anti thrombotics: None  GI procedures: Refer to procedures tab  ROS:  General: Negative for anorexia, weight loss, fever, chills, fatigue, weakness. ENT: Negative for hoarseness, difficulty swallowing , nasal congestion. CV: Negative for chest pain, angina, palpitations, dyspnea on exertion, peripheral edema.  Respiratory: Negative for dyspnea at rest, dyspnea on exertion, cough, sputum, wheezing.  GI: See history of present illness. GU:  Negative for dysuria, hematuria, urinary incontinence, urinary frequency, nocturnal urination.  Endo: Negative for unusual weight change.    Physical Examination:   BP (!) 159/81   Pulse 65   Temp 99 F (37.2 C) (Oral)   Ht 4\' 9"  (1.448 m)   Wt 155 lb (70.3 kg)   BMI 33.54 kg/m   General: Well-nourished, well-developed in no acute distress.  Eyes: No icterus. Conjunctivae pink. Mouth: Oropharyngeal mucosa moist and pink , no lesions erythema or exudate. Lungs: Clear to auscultation bilaterally. Non-labored. Heart: Regular rate and rhythm, no murmurs rubs or gallops.  Abdomen: Bowel sounds are normal, nontender, nondistended, no hepatosplenomegaly or masses, no hernia , no rebound or guarding.   Rectal: She does have prolapsed right anterior hemorrhoid, nontender,  nonbleeding which was manually reduced.  Normal perianal exam.  Digital rectal exam did not reveal any anal fissure Anoscopy revealed large internal hemorrhoids Extremities: No lower extremity edema. No clubbing or deformities. Neuro: Alert and oriented x 3.  Grossly intact. Skin: Warm and dry, no  jaundice.   Psych: Alert and cooperative, normal mood and affect.   Imaging Studies: Reviewed  Assessment and Plan:   Christiana Gurevich is a 73 y.o. female with symptomatic internal hemorrhoids, grade 3, primarily concerning for stool leakage/soiling.  I discussed with patient about risks and benefits of hemorrhoid ligation and she is willing to undergo.  Consent obtained, perform hemorrhoid ligation today  Follow up in 2 weeks   Dr Sherri Sear, MD

## 2018-08-14 DIAGNOSIS — N281 Cyst of kidney, acquired: Secondary | ICD-10-CM | POA: Diagnosis not present

## 2018-08-14 DIAGNOSIS — N201 Calculus of ureter: Secondary | ICD-10-CM | POA: Diagnosis not present

## 2018-08-14 DIAGNOSIS — N200 Calculus of kidney: Secondary | ICD-10-CM | POA: Diagnosis not present

## 2018-08-21 DIAGNOSIS — N281 Cyst of kidney, acquired: Secondary | ICD-10-CM | POA: Diagnosis not present

## 2018-08-21 DIAGNOSIS — N200 Calculus of kidney: Secondary | ICD-10-CM | POA: Diagnosis not present

## 2018-08-31 ENCOUNTER — Other Ambulatory Visit: Payer: Self-pay | Admitting: Family Medicine

## 2018-09-01 ENCOUNTER — Telehealth: Payer: Self-pay

## 2018-09-01 NOTE — Telephone Encounter (Signed)
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 Pt lives with her mother who tracy considers high risk due to pts age and other health issues. Patricia Hoover has contacted her doctor about a note for work but would also like a note from Dr Damita Dunnings in reference to pt does not need to be exposed to covid from her daughter working inside the school building. Patricia Hoover said nutrition employee just tested positive for covid. Patricia Hoover request cb.

## 2018-09-04 NOTE — Telephone Encounter (Signed)
Letter done.  Please make sure printed and I will sign it.  Thanks.

## 2018-09-04 NOTE — Telephone Encounter (Signed)
Patient advised. Letter left at front desk for pick up.  

## 2018-09-06 ENCOUNTER — Other Ambulatory Visit: Payer: Self-pay

## 2018-09-06 ENCOUNTER — Ambulatory Visit (INDEPENDENT_AMBULATORY_CARE_PROVIDER_SITE_OTHER): Payer: Medicare Other | Admitting: Gastroenterology

## 2018-09-06 ENCOUNTER — Encounter: Payer: Self-pay | Admitting: Gastroenterology

## 2018-09-06 VITALS — BP 139/79 | HR 76 | Temp 98.1°F | Resp 17 | Ht <= 58 in | Wt 153.0 lb

## 2018-09-06 DIAGNOSIS — K642 Third degree hemorrhoids: Secondary | ICD-10-CM | POA: Diagnosis not present

## 2018-09-06 NOTE — Progress Notes (Signed)

## 2018-09-22 ENCOUNTER — Other Ambulatory Visit: Payer: Self-pay

## 2018-09-22 ENCOUNTER — Ambulatory Visit (INDEPENDENT_AMBULATORY_CARE_PROVIDER_SITE_OTHER): Payer: Medicare Other | Admitting: Family Medicine

## 2018-09-22 ENCOUNTER — Encounter: Payer: Self-pay | Admitting: Family Medicine

## 2018-09-22 DIAGNOSIS — R202 Paresthesia of skin: Secondary | ICD-10-CM

## 2018-09-22 NOTE — Patient Instructions (Signed)
I don't see any circulation troubles.  Likely positional nerve compression at night.   You can try pillows to reposition.   This doesn't look ominous.  Take care.  Glad to see you.

## 2018-09-22 NOTE — Progress Notes (Signed)
She had GI f/u in the meantime.  She has episodic R leg (possibly B) leg sx.  Going on for about 1 year, possibly longer, but not consistently.  She was worried about circulation in his legs.  She doesn't have cramping with walking.    Sx noted in the R leg after being asleep and then needing to move her leg.  Unclear if the leg is tingling but the leg feels heavy.  No FCNAVD.  No other focal neurologic changes such as swallowing changes, handwriting difficulty, tremor, hand weakness, facial droop.  Meds, vitals, and allergies reviewed.   ROS: Per HPI unless specifically indicated in ROS section   nad ncat Neck supple, no LA rrr ctab abd soft Normal radial and DP pulses B Calf not TTP B  S/S wnl ext x4

## 2018-09-25 NOTE — Assessment & Plan Note (Signed)
Normal exam today.  Likely not an ominous issue but just peripheral nerve compression that is positional, at night.  Discussed with patient.  No sign of vascular compromise.  Update me as needed.  She can try repositioning with pillows at night to see if that helps.

## 2018-09-27 ENCOUNTER — Ambulatory Visit (INDEPENDENT_AMBULATORY_CARE_PROVIDER_SITE_OTHER): Payer: Medicare Other | Admitting: Gastroenterology

## 2018-09-27 ENCOUNTER — Other Ambulatory Visit: Payer: Self-pay

## 2018-09-27 ENCOUNTER — Encounter: Payer: Self-pay | Admitting: Gastroenterology

## 2018-09-27 VITALS — BP 171/85 | HR 66 | Temp 98.2°F | Resp 16 | Ht <= 58 in | Wt 155.0 lb

## 2018-09-27 DIAGNOSIS — K642 Third degree hemorrhoids: Secondary | ICD-10-CM

## 2018-09-27 NOTE — Progress Notes (Signed)
PROCEDURE NOTE: The patient presents with symptomatic grade 3 hemorrhoids, unresponsive to maximal medical therapy, requesting rubber band ligation of his/her hemorrhoidal disease.  All risks, benefits and alternative forms of therapy were described and informed consent was obtained.  The decision was made to band the LL internal hemorrhoid, and the [facility]an System was used to perform band ligation without complication.  Digital anorectal examination was then performed to assure proper positioning of the band, and to adjust the banded tissue as required.  The patient was discharged home without pain or other issues.  Dietary and behavioral recommendations were given and (if necessary - prescriptions were given), along with follow-up instructions.  The patient will return as needed for follow-up and possible additional banding as required.  No complications were encountered and the patient tolerated the procedure well.  

## 2018-10-13 ENCOUNTER — Telehealth: Payer: Self-pay | Admitting: *Deleted

## 2018-10-13 NOTE — Telephone Encounter (Signed)
Patricia Hoover left a voicemail stating that she knows that she needs to get her flu shot this season. Patricia Hoover wants to know if Dr. Damita Dunnings recommends that she get any other vaccines now?

## 2018-10-13 NOTE — Telephone Encounter (Signed)
Agree about the flu shot.   She can check with her insurance about getting shingrix here or at the pharmacy.  She has to separate the high dose flu and shingrix vaccine.   Up to date o/w.  I appreciate her question.  Thanks.

## 2018-10-13 NOTE — Telephone Encounter (Signed)
Patient advised.

## 2018-11-10 ENCOUNTER — Other Ambulatory Visit: Payer: Self-pay | Admitting: Family Medicine

## 2018-11-10 DIAGNOSIS — Z1231 Encounter for screening mammogram for malignant neoplasm of breast: Secondary | ICD-10-CM

## 2018-12-10 ENCOUNTER — Other Ambulatory Visit: Payer: Self-pay

## 2018-12-10 DIAGNOSIS — Z20822 Contact with and (suspected) exposure to covid-19: Secondary | ICD-10-CM

## 2018-12-11 ENCOUNTER — Other Ambulatory Visit: Payer: Self-pay | Admitting: *Deleted

## 2018-12-11 LAB — NOVEL CORONAVIRUS, NAA: SARS-CoV-2, NAA: NOT DETECTED

## 2018-12-11 MED ORDER — SIMVASTATIN 10 MG PO TABS: 10.0000 mg | ORAL_TABLET | ORAL | 1 refills | Status: DC

## 2018-12-12 ENCOUNTER — Encounter: Payer: Self-pay | Admitting: Family Medicine

## 2018-12-19 ENCOUNTER — Other Ambulatory Visit: Payer: Self-pay | Admitting: *Deleted

## 2018-12-19 MED ORDER — SIMVASTATIN 10 MG PO TABS: 10.0000 mg | ORAL_TABLET | ORAL | 1 refills | Status: DC

## 2018-12-26 ENCOUNTER — Ambulatory Visit: Payer: Medicare Other

## 2019-01-01 ENCOUNTER — Other Ambulatory Visit: Payer: Self-pay | Admitting: Family Medicine

## 2019-01-01 DIAGNOSIS — E785 Hyperlipidemia, unspecified: Secondary | ICD-10-CM

## 2019-01-01 DIAGNOSIS — E611 Iron deficiency: Secondary | ICD-10-CM

## 2019-01-01 DIAGNOSIS — M810 Age-related osteoporosis without current pathological fracture: Secondary | ICD-10-CM

## 2019-01-01 DIAGNOSIS — R739 Hyperglycemia, unspecified: Secondary | ICD-10-CM

## 2019-01-01 DIAGNOSIS — I10 Essential (primary) hypertension: Secondary | ICD-10-CM

## 2019-01-02 ENCOUNTER — Ambulatory Visit
Admission: RE | Admit: 2019-01-02 | Discharge: 2019-01-02 | Disposition: A | Discharge status: Home / Self Care | Payer: Medicare Other | Source: Ambulatory Visit | Attending: Family Medicine | Admitting: Family Medicine

## 2019-01-02 ENCOUNTER — Other Ambulatory Visit: Payer: Self-pay

## 2019-01-02 ENCOUNTER — Ambulatory Visit: Payer: Medicare Other

## 2019-01-02 DIAGNOSIS — Z1231 Encounter for screening mammogram for malignant neoplasm of breast: Secondary | ICD-10-CM

## 2019-01-03 ENCOUNTER — Encounter: Payer: Medicare Other | Admitting: Family Medicine

## 2019-01-03 ENCOUNTER — Other Ambulatory Visit (INDEPENDENT_AMBULATORY_CARE_PROVIDER_SITE_OTHER): Payer: Medicare Other

## 2019-01-03 ENCOUNTER — Ambulatory Visit (INDEPENDENT_AMBULATORY_CARE_PROVIDER_SITE_OTHER): Payer: Medicare Other

## 2019-01-03 DIAGNOSIS — E785 Hyperlipidemia, unspecified: Secondary | ICD-10-CM | POA: Diagnosis not present

## 2019-01-03 DIAGNOSIS — R739 Hyperglycemia, unspecified: Secondary | ICD-10-CM | POA: Diagnosis not present

## 2019-01-03 DIAGNOSIS — Z0000 Encounter for general adult medical examination without abnormal findings: Secondary | ICD-10-CM | POA: Diagnosis not present

## 2019-01-03 DIAGNOSIS — I10 Essential (primary) hypertension: Secondary | ICD-10-CM | POA: Diagnosis not present

## 2019-01-03 DIAGNOSIS — E611 Iron deficiency: Secondary | ICD-10-CM

## 2019-01-03 DIAGNOSIS — M810 Age-related osteoporosis without current pathological fracture: Secondary | ICD-10-CM

## 2019-01-03 LAB — IRON: Iron: 79 ug/dL (ref 42–145)

## 2019-01-03 LAB — CBC WITH DIFFERENTIAL/PLATELET
Basophils Absolute: 0.1 10*3/uL (ref 0.0–0.1)
Basophils Relative: 0.6 % (ref 0.0–3.0)
Eosinophils Absolute: 0.1 10*3/uL (ref 0.0–0.7)
Eosinophils Relative: 1.5 % (ref 0.0–5.0)
HCT: 40.4 % (ref 36.0–46.0)
Hemoglobin: 13.4 g/dL (ref 12.0–15.0)
Lymphocytes Relative: 32.6 % (ref 12.0–46.0)
Lymphs Abs: 2.8 10*3/uL (ref 0.7–4.0)
MCHC: 33.2 g/dL (ref 30.0–36.0)
MCV: 88.3 fl (ref 78.0–100.0)
Monocytes Absolute: 0.5 10*3/uL (ref 0.1–1.0)
Monocytes Relative: 6.3 % (ref 3.0–12.0)
Neutro Abs: 5.1 10*3/uL (ref 1.4–7.7)
Neutrophils Relative %: 59 % (ref 43.0–77.0)
Platelets: 261 10*3/uL (ref 150.0–400.0)
RBC: 4.58 Mil/uL (ref 3.87–5.11)
RDW: 13.6 % (ref 11.5–15.5)
WBC: 8.7 10*3/uL (ref 4.0–10.5)

## 2019-01-03 LAB — COMPREHENSIVE METABOLIC PANEL
ALT: 9 U/L (ref 0–35)
AST: 13 U/L (ref 0–37)
Albumin: 4 g/dL (ref 3.5–5.2)
Alkaline Phosphatase: 81 U/L (ref 39–117)
BUN: 13 mg/dL (ref 6–23)
CO2: 29 mEq/L (ref 19–32)
Calcium: 8.9 mg/dL (ref 8.4–10.5)
Chloride: 106 mEq/L (ref 96–112)
Creatinine, Ser: 0.58 mg/dL (ref 0.40–1.20)
GFR: 122.89 mL/min (ref >60.00)
Glucose, Bld: 105 mg/dL — ABNORMAL HIGH (ref 70–99)
Potassium: 3.7 mEq/L (ref 3.5–5.1)
Sodium: 142 mEq/L (ref 135–145)
Total Bilirubin: 0.4 mg/dL (ref 0.2–1.2)
Total Protein: 6.7 g/dL (ref 6.0–8.3)

## 2019-01-03 LAB — LIPID PANEL
Cholesterol: 201 mg/dL — ABNORMAL HIGH (ref 0–200)
HDL: 58.1 mg/dL (ref >39.00)
LDL Cholesterol: 128 mg/dL — ABNORMAL HIGH (ref 0–99)
NonHDL: 143.26
Total CHOL/HDL Ratio: 3
Triglycerides: 74 mg/dL (ref 0.0–149.0)
VLDL: 14.8 mg/dL (ref 0.0–40.0)

## 2019-01-03 LAB — HEMOGLOBIN A1C: Hgb A1c MFr Bld: 6.1 % (ref 4.6–6.5)

## 2019-01-03 LAB — VITAMIN D 25 HYDROXY (VIT D DEFICIENCY, FRACTURES): VITD: 53.46 ng/mL (ref 30.00–100.00)

## 2019-01-03 NOTE — Patient Instructions (Signed)
Patricia Hoover , Thank you for taking time to come for your Medicare Wellness Visit. I appreciate your ongoing commitment to your health goals. Please review the following plan we discussed and let me know if I can assist you in the future.   Screening recommendations/referrals: Colonoscopy: Up to date, completed 03/02/2017 Mammogram: Up to date, completed 01/02/2019 Bone Density: Up to date, completed 06/19/2013 Recommended yearly ophthalmology/optometry visit for glaucoma screening and checkup Recommended yearly dental visit for hygiene and checkup  Vaccinations: Influenza vaccine: Up to date, completed 10/13/2018 Pneumococcal vaccine: Completed series Tdap vaccine: Up to date, completed 07/06/2011 Shingles vaccine: discussed    Advanced directives: Please bring a copy of your POA (Power of [place]) and/or Living Will to your next appointment.   Conditions/risks identified: hypertension, hyperlipidemia, hyperglycemia  Next appointment: 01/09/2019 @ 10:30 am    Preventive Care 74 Years and Older, Female Preventive care refers to lifestyle choices and visits with your health care provider that can promote health and wellness. What does preventive care include?  A yearly physical exam. This is also called an annual well check.  Dental exams once or twice a year.  Routine eye exams. Ask your health care provider how often you should have your eyes checked.  Personal lifestyle choices, including:  Daily care of your teeth and gums.  Regular physical activity.  Eating a healthy diet.  Avoiding tobacco and drug use.  Limiting alcohol use.  Practicing safe sex.  Taking low-dose aspirin every day.  Taking vitamin and mineral supplements as recommended by your health care provider. What happens during an annual well check? The services and screenings done by your health care provider during your annual well check will depend on your age, overall health, lifestyle risk factors, and  family history of disease. Counseling  Your health care provider may ask you questions about your:  Alcohol use.  Tobacco use.  Drug use.  Emotional well-being.  Home and relationship well-being.  Sexual activity.  Eating habits.  History of falls.  Memory and ability to understand (cognition).  Work and work Statistician.  Reproductive health. Screening  You may have the following tests or measurements:  Height, weight, and BMI.  Blood pressure.  Lipid and cholesterol levels. These may be checked every 5 years, or more frequently if you are over 59 years old.  Skin check.  Lung cancer screening. You may have this screening every year starting at age 74 if you have a 30-pack-year history of smoking and currently smoke or have quit within the past 15 years.  Fecal occult blood test (FOBT) of the stool. You may have this test every year starting at age 74.  Flexible sigmoidoscopy or colonoscopy. You may have a sigmoidoscopy every 5 years or a colonoscopy every 10 years starting at age 76.  Hepatitis C blood test.  Hepatitis B blood test.  Sexually transmitted disease (STD) testing.  Diabetes screening. This is done by checking your blood sugar (glucose) after you have not eaten for a while (fasting). You may have this done every 1-3 years.  Bone density scan. This is done to screen for osteoporosis. You may have this done starting at age 74.  Mammogram. This may be done every 1-2 years. Talk to your health care provider about how often you should have regular mammograms. Talk with your health care provider about your test results, treatment options, and if necessary, the need for more tests. Vaccines  Your health care provider may recommend certain vaccines, such  as:  Influenza vaccine. This is recommended every year.  Tetanus, diphtheria, and acellular pertussis (Tdap, Td) vaccine. You may need a Td booster every 10 years.  Zoster vaccine. You may need this  after age 12.  Pneumococcal 13-valent conjugate (PCV13) vaccine. One dose is recommended after age 74.  Pneumococcal polysaccharide (PPSV23) vaccine. One dose is recommended after age 74. Talk to your health care provider about which screenings and vaccines you need and how often you need them. This information is not intended to replace advice given to you by your health care provider. Make sure you discuss any questions you have with your health care provider. Document Released: 01/31/2015 Document Revised: 09/24/2015 Document Reviewed: 11/05/2014 Elsevier Interactive Patient Education  2017 [place] Prevention in the Home Falls can cause injuries. They can happen to people of all ages. There are many things you can do to make your home safe and to help prevent falls. What can I do on the outside of my home?  Regularly fix the edges of walkways and driveways and fix any cracks.  Remove anything that might make you trip as you walk through a door, such as a raised step or threshold.  Trim any bushes or trees on the path to your home.  Use bright outdoor lighting.  Clear any walking paths of anything that might make someone trip, such as rocks or tools.  Regularly check to see if handrails are loose or broken. Make sure that both sides of any steps have handrails.  Any raised decks and porches should have guardrails on the edges.  Have any leaves, snow, or ice cleared regularly.  Use sand or salt on walking paths during winter.  Clean up any spills in your garage right away. This includes oil or grease spills. What can I do in the bathroom?  Use night lights.  Install grab bars by the toilet and in the tub and shower. Do not use towel bars as grab bars.  Use non-skid mats or decals in the tub or shower.  If you need to sit down in the shower, use a plastic, non-slip stool.  Keep the floor dry. Clean up any water that spills on the floor as soon as it  happens.  Remove soap buildup in the tub or shower regularly.  Attach bath mats securely with double-sided non-slip rug tape.  Do not have throw rugs and other things on the floor that can make you trip. What can I do in the bedroom?  Use night lights.  Make sure that you have a light by your bed that is easy to reach.  Do not use any sheets or blankets that are too big for your bed. They should not hang down onto the floor.  Have a firm chair that has side arms. You can use this for support while you get dressed.  Do not have throw rugs and other things on the floor that can make you trip. What can I do in the kitchen?  Clean up any spills right away.  Avoid walking on wet floors.  Keep items that you use a lot in easy-to-reach places.  If you need to reach something above you, use a strong step stool that has a grab bar.  Keep electrical cords out of the way.  Do not use floor polish or wax that makes floors slippery. If you must use wax, use non-skid floor wax.  Do not have throw rugs and other things on the  floor that can make you trip. What can I do with my stairs?  Do not leave any items on the stairs.  Make sure that there are handrails on both sides of the stairs and use them. Fix handrails that are broken or loose. Make sure that handrails are as long as the stairways.  Check any carpeting to make sure that it is firmly attached to the stairs. Fix any carpet that is loose or worn.  Avoid having throw rugs at the top or bottom of the stairs. If you do have throw rugs, attach them to the floor with carpet tape.  Make sure that you have a light switch at the top of the stairs and the bottom of the stairs. If you do not have them, ask someone to add them for you. What else can I do to help prevent falls?  Wear shoes that:  Do not have high heels.  Have rubber bottoms.  Are comfortable and fit you well.  Are closed at the toe. Do not wear sandals.  If you  use a stepladder:  Make sure that it is fully opened. Do not climb a closed stepladder.  Make sure that both sides of the stepladder are locked into place.  Ask someone to hold it for you, if possible.  Clearly mark and make sure that you can see:  Any grab bars or handrails.  First and last steps.  Where the edge of each step is.  Use tools that help you move around (mobility aids) if they are needed. These include:  Canes.  Walkers.  Scooters.  Crutches.  Turn on the lights when you go into a dark area. Replace any light bulbs as soon as they burn out.  Set up your furniture so you have a clear path. Avoid moving your furniture around.  If any of your floors are uneven, fix them.  If there are any pets around you, be aware of where they are.  Review your medicines with your doctor. Some medicines can make you feel dizzy. This can increase your chance of falling. Ask your doctor what other things that you can do to help prevent falls. This information is not intended to replace advice given to you by your health care provider. Make sure you discuss any questions you have with your health care provider. Document Released: 10/31/2008 Document Revised: 06/12/2015 Document Reviewed: 02/08/2014 Elsevier Interactive Patient Education  2017 [employer].

## 2019-01-03 NOTE — Progress Notes (Signed)
Subjective:   Shalaina Dzik is a 73 y.o. female who presents for Medicare Annual (Subsequent) preventive examination.  Review of Systems: N/A   This visit is being conducted through telemedicine via telephone at the nurse health advisor's home address due to the COVID-19 pandemic. This patient has given me verbal consent via doximity to conduct this visit, patient states they are participating from their home address. Patient and myself are on the telephone call. There is no referral for this visit. Some vital signs may be absent or patient reported.    Patient identification: identified by name, DOB, and current address   Cardiac Risk Factors include: advanced age (>57men, >64 women);hypertension;dyslipidemia     Objective:     Vitals: There were no vitals taken for this visit.  There is no height or weight on file to calculate BMI.  Advanced Directives 01/03/2019 03/04/2018 12/19/2017 06/13/2017 04/08/2017 02/28/2017 12/13/2016  Does Patient Have a Medical Advance Directive? Yes Yes Yes Yes Yes No Yes  Type of Paramedic of [place];Living will [place];Living will [place];Living will - [place];Living will - [place];Living will  Does patient want to make changes to medical advance directive? - No - Patient declined - - - - -  Copy of [place] in Chart? No - copy requested No - copy requested No - copy requested - No - copy requested - No - copy requested  Would patient like information on creating a medical advance directive? - - - - - Yes (Inpatient - patient requests chaplain consult to create a medical advance directive) -    Tobacco Social History   Tobacco Use  Smoking Status Former Smoker  . Types: Cigarettes  . Quit date: 01/19/1991  . Years since quitting: 27.9  Smokeless Tobacco Never Used     Counseling given: Not Answered   Clinical  Intake:  Pre-visit preparation completed: Yes  Pain : 0-10 Pain Score: 5  Pain Type: Chronic pain Pain Location: Leg Pain Orientation: Left, Right Pain Descriptors / Indicators: Aching Pain Onset: More than a month ago Pain Frequency: Intermittent     Nutritional Risks: None Diabetes: No  How often do you need to have someone help you when you read instructions, pamphlets, or other written materials from your doctor or pharmacy?: 1 - Never What is the last grade level you completed in school?: some college  Interpreter Needed?: No  Information entered by :: CJohnson, LPN  Past Medical History:  Diagnosis Date  . Arrhythmia    possible hx, resolved prev  . Blood transfusion without reported diagnosis 1972   had reaction; had to stop  . Carpal tunnel syndrome   . Cataract 2019   resolved with surgery  . Duodenal ulcer   . H/O exercise stress test 2012   normal  . Heart murmur    as child  . High cholesterol   . History of kidney stones   . Hx of colonic polyps   . Hypertension   . Osteoporosis 2010   t score - 3.9  . Renal stones    Past Surgical History:  Procedure Laterality Date  . ABDOMINAL HYSTERECTOMY  1972   for endometriosis  . CATARACT EXTRACTION W/ INTRAOCULAR LENS IMPLANT Right 06/2017  . CATARACT EXTRACTION W/ INTRAOCULAR LENS IMPLANT Left 07/2017  . COLONOSCOPY Left 03/02/2017   Procedure: COLONOSCOPY;  Surgeon: Virgel Manifold, MD;  Location: [facility] ENDOSCOPY;  Service: Endoscopy;  Laterality: Left;  . COLONOSCOPY WITH PROPOFOL N/A 05/04/2016   Procedure: COLONOSCOPY WITH PROPOFOL;  Surgeon: Jonathon Bellows, MD;  Location: [facility] ENDOSCOPY;  Service: Endoscopy;  Laterality: N/A;  . ESOPHAGOGASTRODUODENOSCOPY Left 03/02/2017   Procedure: ESOPHAGOGASTRODUODENOSCOPY (EGD);  Surgeon: Virgel Manifold, MD;  Location: [facility] ENDOSCOPY;  Service: Endoscopy;  Laterality: Left;  . ESOPHAGOGASTRODUODENOSCOPY (EGD) WITH PROPOFOL N/A 04/08/2017   Procedure:  ESOPHAGOGASTRODUODENOSCOPY (EGD) WITH PROPOFOL;  Surgeon: Virgel Manifold, MD;  Location: [facility] ENDOSCOPY;  Service: Endoscopy;  Laterality: N/A;  . LITHOTRIPSY     for kidney stone x5  . LITHOTRIPSY  09/2017   Family History  Problem Relation Age of Onset  . Heart disease Mother   . Renal Disease Mother   . Heart failure Mother   . Colon cancer Neg Hx   . Breast cancer Neg Hx    Social History   Socioeconomic History  . Marital status: Widowed    Spouse name: Not on file  . Number of children: Not on file  . Years of education: Not on file  . Highest education level: Not on file  Occupational History  . Occupation: Retired    Comment: Community education officer  Tobacco Use  . Smoking status: Former Smoker    Types: Cigarettes    Quit date: 01/19/1991    Years since quitting: 27.9  . Smokeless tobacco: Never Used  Substance and Sexual Activity  . Alcohol use: No  . Drug use: No  . Sexual activity: Not on file  Other Topics Concern  . Not on file  Social History Narrative   Education:  1 year Business School   Retired back to [employer] after working in [employer]   Widowed '93   2 kids   Social Determinants of Health   Financial Resource Strain: [place]   . Difficulty of Paying Living Expenses: Not hard at all  Food Insecurity: No Food Insecurity  . Worried About Charity fundraiser in the Last Year: Never true  . Ran Out of Food in the Last Year: Never true  Transportation Needs: No Transportation Needs  . Lack of Transportation (Medical): No  . Lack of Transportation (Non-Medical): No  Physical Activity: Sufficiently Active  . Days of Exercise per Week: 3 days  . Minutes of Exercise per Session: 60 min  Stress: No Stress Concern Present  . Feeling of Stress : Not at all  Social Connections:   . Frequency of Communication with Friends and Family: Not on file  . Frequency of Social Gatherings with Friends and Family: Not on file  .  Attends Religious Services: Not on file  . Active Member of Clubs or Organizations: Not on file  . Attends Archivist Meetings: Not on file  . Marital Status: Not on file    Outpatient Encounter Medications as of 01/03/2019  Medication Sig  . amLODipine (NORVASC) 2.5 MG tablet TAKE 1 TABLET DAILY  . Ascorbic Acid (VITAMIN C GUMMIE PO) Take by mouth.  . cholecalciferol (VITAMIN D) 1000 units tablet Take 2 tablets (2,000 Units total) by mouth daily.  Marland Kitchen ELDERBERRY PO Take by mouth.  . simvastatin (ZOCOR) 10 MG tablet Take 1 tablet (10 mg total) by mouth every other day.   No facility-administered encounter medications on file as of 01/03/2019.    Activities of Daily Living In your present state of health, do you have any difficulty performing the following activities: 01/03/2019 03/04/2018  Hearing? Y N  Comment some slight hearing loss -  Vision? N N  Difficulty concentrating or making decisions? N N  Walking or climbing stairs? N Y  Dressing or bathing? N N  Doing errands, shopping? N N  Preparing Food and eating ? N -  Using the Toilet? N -  In the past six months, have you accidently leaked urine? N -  Do you have problems with loss of bowel control? Y -  Comment wears pads -  Managing your Medications? N -  Managing your Finances? N -  Housekeeping or managing your Housekeeping? N -  Some recent data might be hidden    Patient Care Team: Tonia Ghent, MD as PCP - General (Family Medicine) Bryson Ha, OD as Consulting Physician (Optometry)    Assessment:   This is a routine wellness examination for Savina.  Exercise Activities and Dietary recommendations Current Exercise Habits: Home exercise routine, Time (Minutes): 60, Frequency (Times/Week): 3, Weekly Exercise (Minutes/Week): 180, Intensity: Moderate, Exercise limited by: None identified  Goals    . Increase physical activity     Starting 12/13/2016, I will continue to do line dancing for 60  minutes 3 days and to do Silver Sneakers for 45 minutes 3 days per week.     . Patient Stated     01/03/2019, I will maintain and continue medications as prescribed.        Fall Risk Fall Risk  01/03/2019 12/19/2017 12/05/2017 12/13/2016 12/10/2015  Falls in the past year? 0 0 0 No No  Comment - - Emmi Telephone Survey: data to providers prior to load - -  Number falls in past yr: 0 - - - -  Injury with Fall? 0 - - - -  Risk for fall due to : Medication side effect - - - -  Follow up Falls evaluation completed;Falls prevention discussed - - - -   Is the patient's home free of loose throw rugs in walkways, pet beds, electrical cords, etc?   yes      Grab bars in the bathroom? yes      Handrails on the stairs?   yes      Adequate lighting?   yes  Timed Get Up and Go performed: N/A  Depression Screen PHQ 2/9 Scores 01/03/2019 12/19/2017 12/13/2016 12/10/2015  PHQ - 2 Score 0 0 0 0  PHQ- 9 Score 0 0 0 -     Cognitive Function MMSE - Mini Mental State Exam 01/03/2019 12/19/2017 12/13/2016 12/10/2015  Orientation to time 5 5 5 5   Orientation to Place 5 5 5 5   Registration 3 3 3 3   Attention/ Calculation 5 0 0 0  Recall 3 3 3 3   Language- name 2 objects - 0 0 0  Language- repeat 1 1 1 1   Language- follow 3 step command - 3 3 3   Language- read & follow direction - 0 0 0  Write a sentence - 0 0 0  Copy design - 0 0 0  Total score - 20 20 20   Mini Cog  Mini-Cog screen was completed. Maximum score is 22. A value of 0 denotes this part of the MMSE was not completed or the patient failed this part of the Mini-Cog screening.       Immunization History  Administered Date(s) Administered  . Influenza,inj,Quad PF,6+ Mos 10/25/2014, 12/10/2015, 12/13/2016  . Influenza-Unspecified 10/10/2013, 12/03/2017, 10/13/2018  . Pneumococcal Conjugate-13 10/25/2014  . Pneumococcal Polysaccharide-23 06/04/2013  . Tdap  07/06/2011  . Zoster 07/06/2011    Qualifies for Shingles Vaccine?  Yes  Screening Tests Health Maintenance  Topic Date Due  . MAMMOGRAM  01/01/2021  . TETANUS/TDAP  07/05/2021  . COLONOSCOPY  03/02/2022  . INFLUENZA VACCINE  Completed  . DEXA SCAN  Completed  . Hepatitis C Screening  Completed  . PNA vac Low Risk Adult  Completed    Cancer Screenings: Lung: Low Dose CT Chest recommended if Age 25-80 years, 30 pack-year currently smoking OR have quit w/in 15years. Patient does not qualify. Breast:  Up to date on Mammogram? Yes, completed 01/02/2019   Up to date of Bone Density/Dexa? Yes, completed 06/19/2013 Colorectal: completed 03/02/2017  Additional Screenings:  Hepatitis C Screening: 10/29/2014     Plan:   Patient will maintain and continue medications as prescribed.   I have personally reviewed and noted the following in the patient's chart:   . Medical and social history . Use of alcohol, tobacco or illicit drugs  . Current medications and supplements . Functional ability and status . Nutritional status . Physical activity . Advanced directives . List of other physicians . Hospitalizations, surgeries, and ER visits in previous 12 months . Vitals . Screenings to include cognitive, depression, and falls . Referrals and appointments  In addition, I have reviewed and discussed with patient certain preventive protocols, quality metrics, and best practice recommendations. A written personalized care plan for preventive services as well as general preventive health recommendations were provided to patient.     Andrez Grime, LPN  [id number]

## 2019-01-03 NOTE — Progress Notes (Signed)
PCP notes:  Health Maintenance: No gaps noted   Abnormal Screenings: none   Patient concerns: Patient is having arthritic pain in her legs and knees.   Nurse concerns: none   Next PCP appt.: 01/09/2019 @ 10:30 am

## 2019-01-09 ENCOUNTER — Other Ambulatory Visit: Payer: Self-pay

## 2019-01-09 ENCOUNTER — Ambulatory Visit (INDEPENDENT_AMBULATORY_CARE_PROVIDER_SITE_OTHER): Payer: Medicare Other | Admitting: Family Medicine

## 2019-01-09 ENCOUNTER — Encounter: Payer: Self-pay | Admitting: Family Medicine

## 2019-01-09 VITALS — BP 170/80 | HR 64 | Temp 97.3°F | Ht <= 58 in | Wt 158.2 lb

## 2019-01-09 DIAGNOSIS — M25561 Pain in right knee: Secondary | ICD-10-CM

## 2019-01-09 DIAGNOSIS — I10 Essential (primary) hypertension: Secondary | ICD-10-CM | POA: Diagnosis not present

## 2019-01-09 DIAGNOSIS — M25562 Pain in left knee: Secondary | ICD-10-CM

## 2019-01-09 DIAGNOSIS — E785 Hyperlipidemia, unspecified: Secondary | ICD-10-CM

## 2019-01-09 DIAGNOSIS — Z0000 Encounter for general adult medical examination without abnormal findings: Secondary | ICD-10-CM | POA: Diagnosis not present

## 2019-01-09 DIAGNOSIS — G8929 Other chronic pain: Secondary | ICD-10-CM

## 2019-01-09 DIAGNOSIS — Z7189 Other specified counseling: Secondary | ICD-10-CM

## 2019-01-09 DIAGNOSIS — K644 Residual hemorrhoidal skin tags: Secondary | ICD-10-CM | POA: Diagnosis not present

## 2019-01-09 NOTE — Progress Notes (Signed)
This visit occurred during the SARS-CoV-2 public health emergency.  Safety protocols were in place, including screening questions prior to the visit, additional usage of staff PPE, and extensive cleaning of exam room while observing appropriate contact time as indicated for disinfecting solutions.  DXA deferred given med intolerance.   Flu 2020 PNA up to date.  Tetanus 2013 Shingles d/w pt.  See avs.  Colonoscopy 2019 Mammogram 2020 Advance directive dw pt. Patricia Hoover designated if patient were incapacitated.  She has hemorrhoid procedure done.  She has some occ incomplete bowel elimination and we talked about trial of kegel exercises.  She will update me as needed.  She is not passing blood.  Hypertension:    Using medication without problems or lightheadedness: yes Chest pain with exertion:no Edema:no Short of breath:no She forgot to take her meds this AM.  Usually her pressure is better/controlled.    Elevated Cholesterol: Using medications without problems:  yes Muscle aches: some aches, see below.  Diet compliance: encouraged.   Exercise: encouraged as tolerated.    covid considerations d/w pt.    Knee pain.  Better than prev but still sore walking.    No FCNAVD.    Meds, vitals, and allergies reviewed.   PMH and SH reviewed  ROS: Per HPI unless specifically indicated in ROS section   GEN: nad, alert and oriented HEENT: ncat NECK: supple w/o LA CV: rrr. PULM: ctab, no inc wob ABD: soft, +bs EXT: no edema SKIN: no acute rash Medial and lateral joint lines not ttp but patellar ligament slightly sore on standing.

## 2019-01-09 NOTE — Patient Instructions (Addendum)
Check with your insurance to see if they will cover the shingles shot. Try doing kegel exercises (10 in a row) and see if that helps.   Update me as needed.  If your BP isn't controlled, then let me know.  Try stopping your simvastatin for 2 weeks and see if the aches get better. Either way, let me know.   Try icing your knees.     Take care.  Glad to see you.

## 2019-01-10 NOTE — Assessment & Plan Note (Signed)
Reasonable to ice in the meantime.  Unclear if this is statin related.  She can hold her statin for a period of time and then update me if she is clearly better.  See after visit summary.  She agrees with plan.

## 2019-01-10 NOTE — Assessment & Plan Note (Signed)
She has hemorrhoid procedure done.  She has some occ incomplete bowel elimination and we talked about trial of kegel exercises.  She will update me as needed.  She is not passing blood.

## 2019-01-10 NOTE — Assessment & Plan Note (Signed)
Advance directive dw pt. Tracy Keels designated if patient were incapacitated.  °

## 2019-01-10 NOTE — Assessment & Plan Note (Addendum)
Reasonable to hold statin briefly and then update me.  Labs discussed with patient.  Diet and exercise discussed with patient.  See after visit summary.

## 2019-01-10 NOTE — Assessment & Plan Note (Signed)
She forgot to take her meds this AM.  Usually her pressure is better/controlled.  She will update me as needed.  Labs discussed with patient. >25 minutes spent in face to face time with patient, >50% spent in counselling or coordination of care

## 2019-01-10 NOTE — Assessment & Plan Note (Signed)
DXA deferred given med intolerance.   Flu 2020 PNA up to date.  Tetanus 2013 Shingles d/w pt.  See avs.  Colonoscopy 2019 Mammogram 2020 Advance directive dw pt. Odetta Pink designated if patient were incapacitated.

## 2019-01-17 ENCOUNTER — Encounter: Payer: Self-pay | Admitting: Family Medicine

## 2019-01-17 DIAGNOSIS — M79606 Pain in leg, unspecified: Secondary | ICD-10-CM

## 2019-01-17 NOTE — Telephone Encounter (Signed)
See my chart message associated with this encounter.  Please triage patient about her leg symptoms.  Thanks.

## 2019-01-18 NOTE — Telephone Encounter (Signed)
Left v/m requesting pt to call [phone] about leg problem.

## 2019-01-18 NOTE — Telephone Encounter (Signed)
I spoke with pt; pt had annual appt on 01/09/19. Pt talked about the leg issue at annual visit and pt wanted to make sure that Dr Damita Dunnings knew that pt had problem on and off lifting her leg and the reason for call was to make sure Dr Damita Dunnings knew it was calf of leg that was bothering her and not her knee. Both legs have pain on and off; no redness or swelling. No CP or SOB. Pt did not want to triage further she just wants to make sure Dr Damita Dunnings knows that it problem is not her knee but calf area. Pt sat in car for 4 hours and when started to get out of car she had to wait awhile due to not being able to walk. Pt wants to know if needs to see a specialist/ pt request cb

## 2019-01-18 NOTE — Telephone Encounter (Signed)
Noted. Thanks.  Will send patient a note via mychart.  If not better with stopping simvastatin, then reasonable to restart that med and will refer to ortho in the meantime.

## 2019-02-06 DIAGNOSIS — M48061 Spinal stenosis, lumbar region without neurogenic claudication: Secondary | ICD-10-CM | POA: Diagnosis not present

## 2019-02-09 ENCOUNTER — Ambulatory Visit: Payer: Medicare Other | Attending: Internal Medicine

## 2019-02-09 DIAGNOSIS — Z23 Encounter for immunization: Secondary | ICD-10-CM

## 2019-02-09 NOTE — Progress Notes (Signed)
   Covid-19 Vaccination Clinic  Name:  Patricia Hoover    MRN: [id number] DOB: 14-Oct-1945  02/09/2019  Ms. Tall was observed post Covid-19 immunization for 15 minutes without incidence. She was provided with Vaccine Information Sheet and instruction to access the V-Safe system.   [name] was instructed to call 911 with any severe reactions post vaccine: Marland Kitchen Difficulty breathing  . Swelling of your face and throat  . A fast heartbeat  . A bad rash all over your body  . Dizziness and weakness    Immunizations Administered    Name Date Dose VIS Date Route   Pfizer COVID-19 Vaccine 02/09/2019  9:01 AM 0.3 mL 12/29/2018 Intramuscular   Manufacturer: [place]   Lot: [id number]   [place]: [id number]

## 2019-02-27 ENCOUNTER — Ambulatory Visit: Payer: Medicare Other | Attending: Internal Medicine

## 2019-02-27 DIAGNOSIS — Z23 Encounter for immunization: Secondary | ICD-10-CM | POA: Insufficient documentation

## 2019-02-27 NOTE — Progress Notes (Signed)
   Covid-19 Vaccination Clinic  Name:  Clay Alviar    MRN: [id number] DOB: 1945-09-12  02/27/2019  Ms. Sonoda was observed post Covid-19 immunization for 15 minutes without incidence. She was provided with Vaccine Information Sheet and instruction to access the V-Safe system.   Ms. Burak was instructed to call 911 with any severe reactions post vaccine: Marland Kitchen Difficulty breathing  . Swelling of your face and throat  . A fast heartbeat  . A bad rash all over your body  . Dizziness and weakness    Immunizations Administered    Name Date Dose VIS Date Route   Pfizer COVID-19 Vaccine 02/27/2019  8:20 AM 0.3 mL 12/29/2018 Intramuscular   Manufacturer: [place]   Lot: [id number]   [place]: [id number]

## 2019-03-21 DIAGNOSIS — N200 Calculus of kidney: Secondary | ICD-10-CM | POA: Diagnosis not present

## 2019-03-21 DIAGNOSIS — R319 Hematuria, unspecified: Secondary | ICD-10-CM | POA: Diagnosis not present

## 2019-03-24 DIAGNOSIS — S62623A Displaced fracture of medial phalanx of left middle finger, initial encounter for closed fracture: Secondary | ICD-10-CM | POA: Diagnosis not present

## 2019-03-24 DIAGNOSIS — M7989 Other specified soft tissue disorders: Secondary | ICD-10-CM | POA: Diagnosis not present

## 2019-03-26 ENCOUNTER — Telehealth: Payer: Self-pay

## 2019-03-26 DIAGNOSIS — M79645 Pain in left finger(s): Secondary | ICD-10-CM | POA: Diagnosis not present

## 2019-03-26 NOTE — Telephone Encounter (Signed)
[place] Night - Client TELEPHONE ADVICE RECORD AccessNurse Patient Name: Yulisa Aja Gender: Female DOB: 23-Nov-1945 Age: 74 Y 31 M 9 D Return Phone Number: [phone] (Primary), [phone] (Secondary) Address[place] [address] Client [place] Night - Client Client Site [place] Physician Renford Dills - MD Contact Type Call Who Is Calling Patient / Member / Family / Caregiver Call Type Triage / Clinical Relationship To Patient Self Return Phone Number [phone] (Primary) Chief Complaint Finger Injury Reason for Call Symptomatic / Request for [place] states she wants to know where to go for her swollen and purple finger. Caller said she accidentally hit it and injured it. Translation No Nurse Assessment Nurse: Mirna Mires, RN, Katharine Look Date/Time (Eastern Time): 03/24/2019 8:45:43 AM Confirm and document reason for call. If symptomatic, describe symptoms. ---Caller states she wants to know where to go for her swollen and purple finger. Caller said she accidentally hit it and injured it. Has the patient had close contact with a person known or suspected to have the novel coronavirus illness OR traveled / lives in area with major community spread (including international travel) in the last 14 days from the onset of symptoms? * If Asymptomatic, screen for exposure and travel within the last 14 days. ---No Does the patient have any new or worsening symptoms? ---Yes Will a triage be completed? ---Yes Related visit to physician within the last 2 weeks? ---Yes Does the PT have any chronic conditions? (i.e. diabetes, asthma, this includes High risk factors for pregnancy, etc.) ---No Is this a behavioral health or substance abuse call? ---No Guidelines Guideline Title Affirmed Question Affirmed Notes Nurse Date/Time (Eastern Time) Finger Injury  Looks like a broken bone (e.g., crooked or deformed) Plummer, RN, Katharine Look 03/24/2019 8:46:21 AM Disp. Time Eilene Ghazi Time) Disposition Final User 03/24/2019 8:50:20 AM Go to ED Now Yes Mirna Mires, RN, Marc Morgans NOTE: All timestamps contained within this report are represented as [place] Standard Time. CONFIDENTIALTY NOTICE: This fax transmission is intended only for the addressee. It contains information that is legally privileged, confidential or otherwise protected from use or disclosure. If you are not the intended recipient, you are strictly prohibited from reviewing, disclosing, copying using or disseminating any of this information or taking any action in reliance on or regarding this information. If you have received this fax in error, please notify us immediately by telephone so that we can arrange for its return to [place]. Phone: [phone], Toll-Free: [phone], Fax: [phone] Page: 2 of 2 Call Id: [id number] [place] Disagree/Comply Comply Caller Understands Yes PreDisposition Did not know what to do Care Advice Given Per Guideline GO TO ED NOW: FIRST AID ADVICE FOR SUSPECTED FINGER FRACTURE (BROKEN BONE) OR DISLOCATION (OUT OF JOINT): LOCAL COLD: Apply a cold pack or soak in cold water for 20 minutes. CARE ADVICE given per Finger Injury (Adult) guideline. Comments User: Laurey Arrow, RN Date/Time Eilene Ghazi Time): 03/24/2019 8:52:53 AM caller was hesitating about going to the ER asking about just going to the office on Monday. I told her lots of offices and clinics cannot do x-rays but also gave her the number to the [facility] that will open shortly and told her she can call there to see if they can see her with this injury. She says if they cannot she will go on to the ER. Referrals [facility]

## 2019-03-26 NOTE — Telephone Encounter (Signed)
Thanks. Noted.

## 2019-03-26 NOTE — Telephone Encounter (Signed)
Pt was seen on 3/6 at [facility]. Pt was diagnosed with finger fracture.   Contacted pt who reports she is doing well and has been referred to ortho and was seen today. Pt reports doing well with no pain. Advised if anything changes to contact this office. Pt verbalized understanding.

## 2019-04-09 DIAGNOSIS — N200 Calculus of kidney: Secondary | ICD-10-CM | POA: Diagnosis not present

## 2019-04-11 DIAGNOSIS — S62653A Nondisplaced fracture of medial phalanx of left middle finger, initial encounter for closed fracture: Secondary | ICD-10-CM | POA: Diagnosis not present

## 2019-04-13 ENCOUNTER — Ambulatory Visit: Payer: Medicare Other | Admitting: Family Medicine

## 2019-04-26 ENCOUNTER — Ambulatory Visit: Payer: Medicare Other | Admitting: Family Medicine

## 2019-05-01 ENCOUNTER — Encounter: Payer: Self-pay | Admitting: Family Medicine

## 2019-05-01 ENCOUNTER — Other Ambulatory Visit: Payer: Self-pay

## 2019-05-01 ENCOUNTER — Ambulatory Visit (INDEPENDENT_AMBULATORY_CARE_PROVIDER_SITE_OTHER): Payer: Medicare Other | Admitting: Family Medicine

## 2019-05-01 VITALS — BP 144/80 | HR 83 | Temp 97.4°F | Ht <= 58 in | Wt 157.0 lb

## 2019-05-01 DIAGNOSIS — H6980 Other specified disorders of Eustachian tube, unspecified ear: Secondary | ICD-10-CM

## 2019-05-01 DIAGNOSIS — R202 Paresthesia of skin: Secondary | ICD-10-CM | POA: Diagnosis not present

## 2019-05-01 DIAGNOSIS — R739 Hyperglycemia, unspecified: Secondary | ICD-10-CM | POA: Diagnosis not present

## 2019-05-01 LAB — POCT GLYCOSYLATED HEMOGLOBIN (HGB A1C): Hemoglobin A1C: 5.8 % — AB (ref 4.0–5.6)

## 2019-05-01 MED ORDER — FLUTICASONE PROPIONATE 50 MCG/ACT NA SUSP: 2.0000 | Freq: Every day | NASAL | Status: DC

## 2019-05-01 NOTE — Patient Instructions (Addendum)
Wear a wrist splint at night and see if that helps.  If not then let me know.  Eustachian tube dysfunction.  Gently try to "pop" your ears and use flonase daily.  Okay to restart simvastatin 10mg  every other day and update me if not tolerated.   Take care.  Glad to see you.

## 2019-05-01 NOTE — Progress Notes (Signed)
This visit occurred during the SARS-CoV-2 public health emergency.  Safety protocols were in place, including screening questions prior to the visit, additional usage of staff PPE, and extensive cleaning of exam room while observing appropriate contact time as indicated for disinfecting solutions.  Tingling/burning in R hand.  Some numbness in the hand.  No L handed sx.  No foot sx on either side.  No arm sx B.  Only on 2nd-5th palmar side of the fingers. Not in the palm or dorsum of hand.  Going on for a "long time" intermittently but worse recently.  Can wake up with symptoms. She has been stretching. R handed.  No weakness.  No FCNAV.  Doesn't feel well o/w.  R handed sx noted at night.  No weakness.   L ear sx with a "suction feeling" intermittently.  No pain.    Recheck A1c done at [facility].  H/o hyperglycemia.  A1c 5.8, d/w pt at [facility].    Meds, vitals, and allergies reviewed.   ROS: Per HPI unless specifically indicated in ROS section   nad ncat TM inspection wnl B without erythema, canals normal bilaterally.  She has no tympanic membrane movement on eustachian tube testing with Valsalva. Neck supple no LA abd soft Ext w/o edema.  CN 2-12 wnl B, S/S/DTR wnl x4 Right hand with normal grip.  Right wrist Tinel negative.  Sensation intact. Left third finger splinted at baseline given previous phalanx fracture.

## 2019-05-02 DIAGNOSIS — H6990 Unspecified Eustachian tube disorder, unspecified ear: Secondary | ICD-10-CM | POA: Insufficient documentation

## 2019-05-02 DIAGNOSIS — H6980 Other specified disorders of Eustachian tube, unspecified ear: Secondary | ICD-10-CM | POA: Insufficient documentation

## 2019-05-02 NOTE — Assessment & Plan Note (Signed)
No weakness.  Noted at night.  Likely with carpal tunnel symptoms with compression at nigh, this likely postural/positional.  She can use a nocturnal splint.  Update me as needed.  She agrees.

## 2019-05-02 NOTE — Assessment & Plan Note (Signed)
Discussed anatomy, using Flonase, gently perform Valsalva and update me as needed.

## 2019-05-02 NOTE — Assessment & Plan Note (Signed)
A1c controlled.  Discussed with patient.  Continue work on diet and exercise.

## 2019-05-09 DIAGNOSIS — M79645 Pain in left finger(s): Secondary | ICD-10-CM | POA: Diagnosis not present

## 2019-05-16 DIAGNOSIS — N200 Calculus of kidney: Secondary | ICD-10-CM | POA: Diagnosis not present

## 2019-05-18 ENCOUNTER — Encounter: Payer: Self-pay | Admitting: Family Medicine

## 2019-05-20 ENCOUNTER — Telehealth: Payer: Self-pay | Admitting: Family Medicine

## 2019-05-20 NOTE — Telephone Encounter (Addendum)
Please check with patient.  I am out of clinic with a family emergency.  I don't have access to recent consult note as I am out of clinic.  She was asking about glucosamine and chondroitin.  This may not help much, but it is a low risk med to try.  Please triage her.  Thanks.

## 2019-05-21 ENCOUNTER — Ambulatory Visit (INDEPENDENT_AMBULATORY_CARE_PROVIDER_SITE_OTHER): Payer: Medicare Other | Admitting: Family Medicine

## 2019-05-21 ENCOUNTER — Encounter: Payer: Self-pay | Admitting: Family Medicine

## 2019-05-21 ENCOUNTER — Other Ambulatory Visit: Payer: Self-pay

## 2019-05-21 ENCOUNTER — Ambulatory Visit (INDEPENDENT_AMBULATORY_CARE_PROVIDER_SITE_OTHER)
Admission: RE | Admit: 2019-05-21 | Discharge: 2019-05-21 | Disposition: A | Discharge status: Home / Self Care | Payer: Medicare Other | Source: Ambulatory Visit | Attending: Family Medicine | Admitting: Family Medicine

## 2019-05-21 VITALS — BP 148/72 | HR 81 | Temp 97.8°F | Ht <= 58 in | Wt 158.2 lb

## 2019-05-21 DIAGNOSIS — M79606 Pain in leg, unspecified: Secondary | ICD-10-CM | POA: Insufficient documentation

## 2019-05-21 DIAGNOSIS — M25561 Pain in right knee: Secondary | ICD-10-CM

## 2019-05-21 DIAGNOSIS — M79604 Pain in right leg: Secondary | ICD-10-CM

## 2019-05-21 IMAGING — DX DG KNEE COMPLETE 4+V*R*
4 series · 4 of 4 positions shown · non-contrast
Comparison: None.

CLINICAL DATA: 73-year-old female with acute on chronic pain behind
the right knee.

EXAM:
RIGHT KNEE - COMPLETE 4+ VIEW

[knee ap]
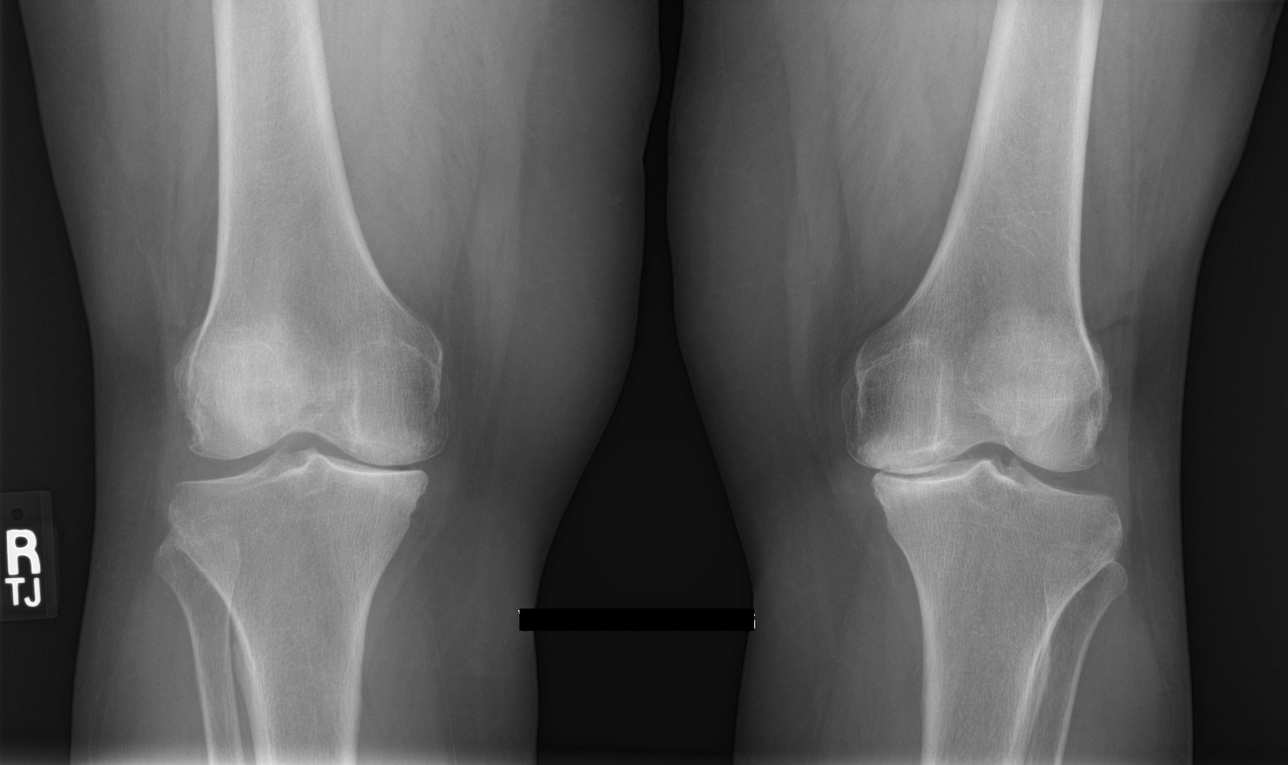

[knee tunnel]
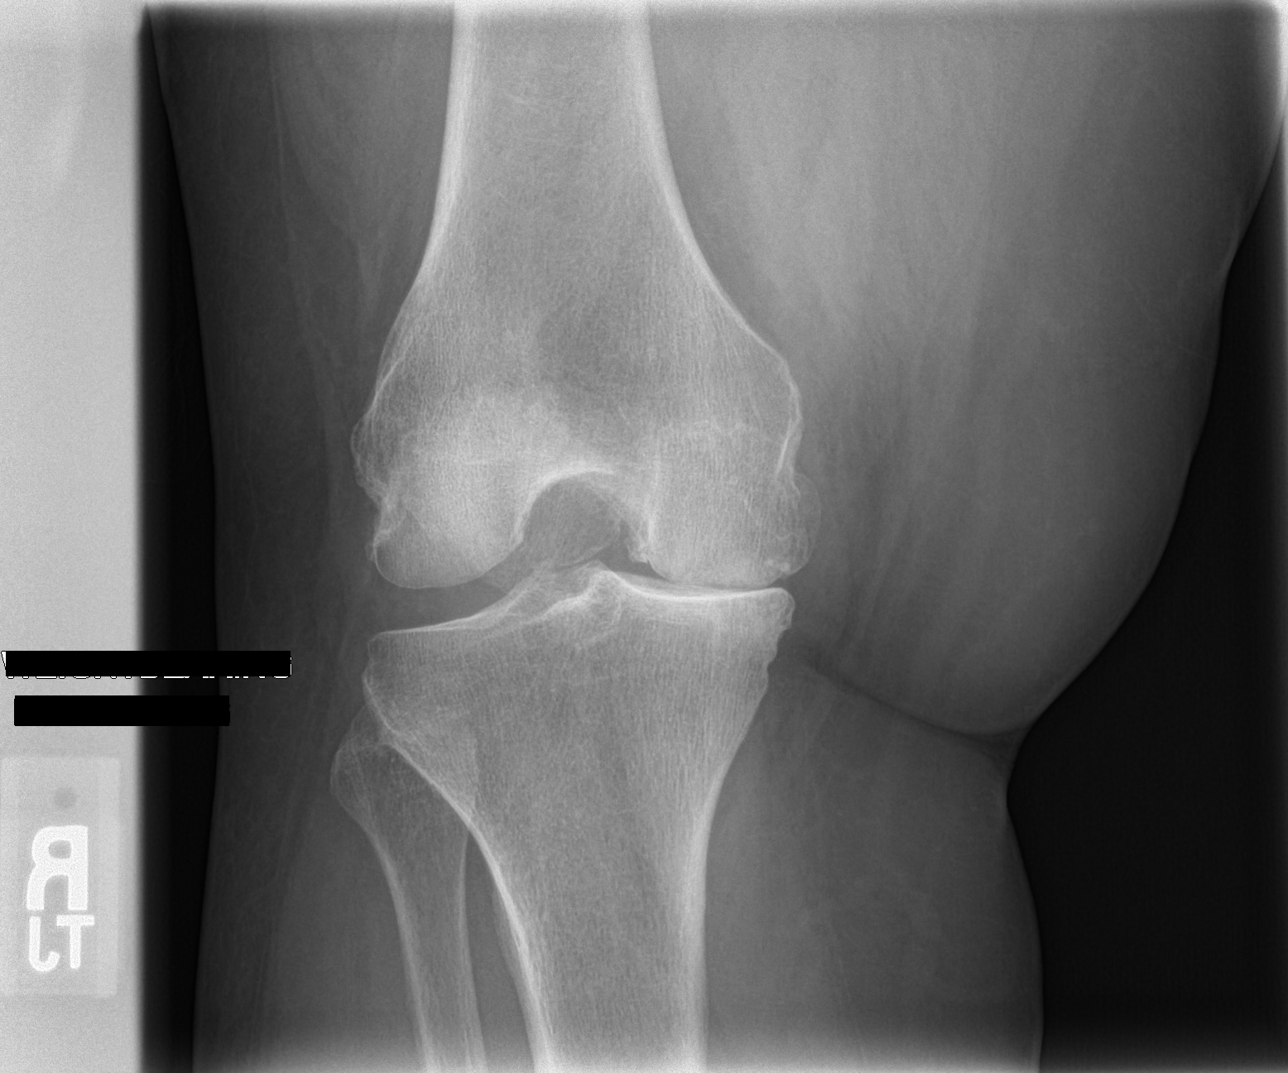

[knee lat]
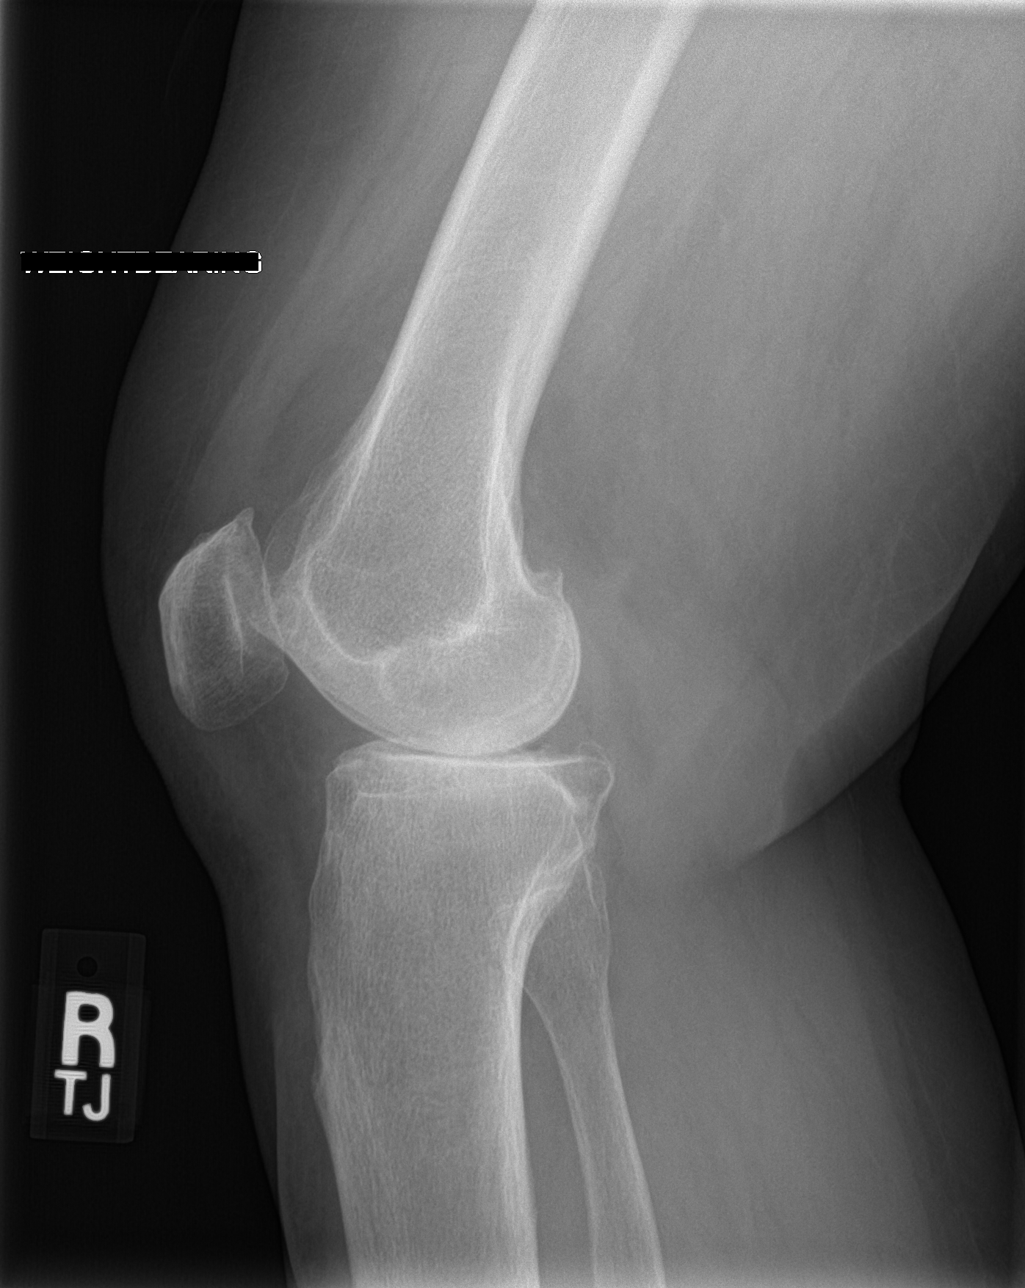

[patella skyline]
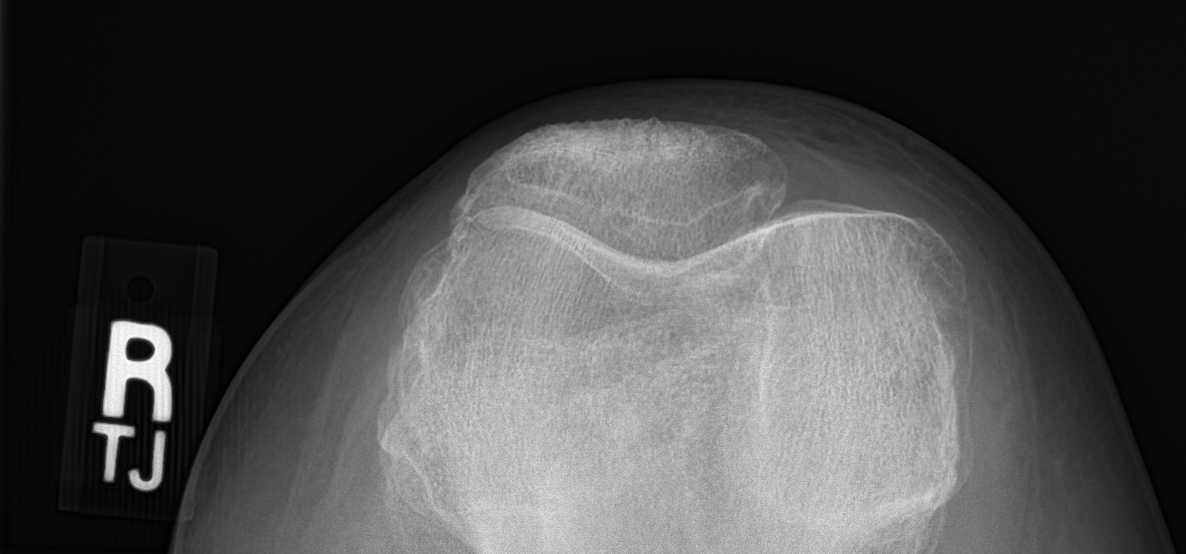

[4 of 4 positions shown; findings below may reference images not displayed]

FINDINGS: There is no acute fracture or dislocation. Bilateral knee
osteoarthritic changes with severe narrowing of the medial
compartments and bone spurring. No significant joint effusion. The
soft tissues are unremarkable.
IMPRESSION: 1. No acute fracture or dislocation.
2. Bilateral knee osteoarthritic changes.

## 2019-05-21 NOTE — Patient Instructions (Signed)
Try acetaminophen (tylenol) for pain as needed  You can also try the glucosamine supplement   I think your leg pain is mostly from your back  Eventually you may want to consider an MRI (working with your orthopedic)   Let's also get a knee xray today to see if there is arthritis in the knee adding to your problem   Use your cane if needed

## 2019-05-21 NOTE — Telephone Encounter (Addendum)
I spoke with pt; pt has had achy pain behind rt knee and calf for over one month; pt saw specialist at [facility] 02/06/19 and pt said was advised rt knee and calf pain coming from pts back and pt was offered a shot in knee and pt declined. Pt does not want a steroid shot. Pt is presently walking with a cane. Pain level now is 5. NO CP or SOB.No redness, red streaks, warmth or swelling in rt leg. Pt has no covid symptoms, no travel and no known exposure to + covid. Pt scheduled 30' in office appt with Dr Glori Bickers today at 2 pm, pt will be at office at 1:45 to get checked in at front desk. UC precautions given and pt voiced understanding.

## 2019-05-21 NOTE — Progress Notes (Signed)
Subjective:    Patient ID: Patricia Hoover, female    DOB: 08-20-1945, 74 y.o.   MRN: [id number]  This visit occurred during the SARS-CoV-2 public health emergency.  Safety protocols were in place, including screening questions prior to the visit, additional usage of staff PPE, and extensive cleaning of exam room while observing appropriate contact time as indicated for disinfecting solutions.    HPI 74 yo pt of Dr Damita Dunnings presents with R leg pain behind knee /calf  Wt Readings from Last 3 Encounters:  05/21/19 158 lb 4 oz (71.8 kg)  05/01/19 157 lb (71.2 kg)  01/09/19 158 lb 4 oz (71.8 kg)   34.24 kg/m    Has had calf pain for a while- R leg  Not a new problem   Right leg feels heavy now at night when she turns over No stinging or burning in the leg   No swelling   Extending leg/hip/knee worsens her discomfort  Now she has more pain when she walks also   Does not get leg cramps   No back pain right now  (has in the past)   She tried going off of simvastatin and then back on it (no difference)  She stopped eating beef for a while also   She exercises every 3 days on average (it used to be more)  Uses a cane on the R side   Has OA and OP   Interested in glucosamine/magnesium supplement    She has a h/o spinal stenosis of L5-S1  Has seen the [facility] for this Told she has neurogenic claudication likely causing calf pain  She declines MRI/further at  w/u in January    Knees are not bothering her  Thinks she has arthritis all over however  No swelling in legs No vein problems    Patient Active Problem List   Diagnosis Date Noted  . Posterior knee pain, right 05/21/2019  . Right leg pain 05/21/2019  . Eustachian tube dysfunction 05/02/2019  . Healthcare maintenance 01/08/2018  . Paresthesia 01/08/2018  . Neck pain 08/31/2017  . Angioedema 06/15/2017  . Peptic ulcer of duodenum   . Other specified diseases of esophagus   . Hematochezia   .  External hemorrhoids   . Diverticulosis of large intestine without diverticulitis   . Internal hemorrhoids   . Duodenal ulceration   . Stomach irritation   . GIB (gastrointestinal bleeding) 02/28/2017  . Abnormal stools 02/27/2017  . Advance care planning 12/23/2016  . Kidney stone 04/20/2016  . Heartburn 12/17/2015  . Medicare annual wellness visit, subsequent 06/05/2013  . Hyperglycemia 06/05/2013  . Knee pain 08/31/2012  . Osteoporosis 08/18/2012  . HTN (hypertension) 08/09/2012  . HLD (hyperlipidemia) 08/09/2012   Past Medical History:  Diagnosis Date  . Arrhythmia    possible hx, resolved prev  . Blood transfusion without reported diagnosis 1972   had reaction; had to stop  . Carpal tunnel syndrome   . Cataract 2019   resolved with surgery  . Duodenal ulcer   . H/O exercise stress test 2012   normal  . Heart murmur    as child  . High cholesterol   . History of kidney stones   . Hx of colonic polyps   . Hypertension   . Osteoporosis 2010   t score - 3.9  . Renal stones    Past Surgical History:  Procedure Laterality Date  . ABDOMINAL HYSTERECTOMY  1972   for endometriosis  . CATARACT  EXTRACTION W/ INTRAOCULAR LENS IMPLANT Right 06/2017  . CATARACT EXTRACTION W/ INTRAOCULAR LENS IMPLANT Left 07/2017  . COLONOSCOPY Left 03/02/2017   Procedure: COLONOSCOPY;  Surgeon: Virgel Manifold, MD;  Location: [facility] ENDOSCOPY;  Service: Endoscopy;  Laterality: Left;  . COLONOSCOPY WITH PROPOFOL N/A 05/04/2016   Procedure: COLONOSCOPY WITH PROPOFOL;  Surgeon: Jonathon Bellows, MD;  Location: [facility] ENDOSCOPY;  Service: Endoscopy;  Laterality: N/A;  . ESOPHAGOGASTRODUODENOSCOPY Left 03/02/2017   Procedure: ESOPHAGOGASTRODUODENOSCOPY (EGD);  Surgeon: Virgel Manifold, MD;  Location: [facility] ENDOSCOPY;  Service: Endoscopy;  Laterality: Left;  . ESOPHAGOGASTRODUODENOSCOPY (EGD) WITH PROPOFOL N/A 04/08/2017   Procedure: ESOPHAGOGASTRODUODENOSCOPY (EGD) WITH PROPOFOL;  Surgeon: Virgel Manifold, MD;  Location: [facility] ENDOSCOPY;  Service: Endoscopy;  Laterality: N/A;  . LITHOTRIPSY     for kidney stone x5  . LITHOTRIPSY  09/2017   Social History   Tobacco Use  . Smoking status: Former Smoker    Types: Cigarettes    Quit date: 01/19/1991    Years since quitting: 28.3  . Smokeless tobacco: Never Used  Substance Use Topics  . Alcohol use: No    Comment: rare wine  . Drug use: No   Family History  Problem Relation Age of Onset  . Heart disease Mother   . Renal Disease Mother   . Heart failure Mother   . Colon cancer Neg Hx   . Breast cancer Neg Hx    Allergies  Allergen Reactions  . Ace Inhibitors Swelling  . Angiotensin Receptor Blockers     Would avoid, h/o swelling with ACE prev  . Apple Other (See Comments)    heartburn  . Aspirin Other (See Comments)    GI bleed  . Bisphosphonates     GI side effects  . Nsaids     GI bleed  . Prolia [Denosumab]     GI upset   Current Outpatient Medications on File Prior to Visit  Medication Sig Dispense Refill  . amLODipine (NORVASC) 2.5 MG tablet TAKE 1 TABLET DAILY 90 tablet 3  . Ascorbic Acid (VITAMIN C GUMMIE PO) Take by mouth.    . cholecalciferol (VITAMIN D) 1000 units tablet Take 2 tablets (2,000 Units total) by mouth daily.    Marland Kitchen ELDERBERRY PO Take by mouth.    . fluticasone (FLONASE) 50 MCG/ACT nasal spray Place 2 sprays into both nostrils daily.    . simvastatin (ZOCOR) 10 MG tablet Take 1 tablet (10 mg total) by mouth every other day. 90 tablet 1   No current facility-administered medications on file prior to visit.    Review of Systems  Constitutional: Negative for activity change, appetite change, fatigue, fever and unexpected weight change.  HENT: Negative for congestion, ear pain, rhinorrhea, sinus pressure and sore throat.   Eyes: Negative for pain, redness and visual disturbance.  Respiratory: Negative for cough, shortness of breath and wheezing.   Cardiovascular: Negative for chest pain and  palpitations.  Gastrointestinal: Negative for abdominal pain, blood in stool, constipation and diarrhea.  Endocrine: Negative for polydipsia and polyuria.  Genitourinary: Negative for dysuria, frequency and urgency.  Musculoskeletal: Positive for arthralgias and gait problem. Negative for back pain, joint swelling and myalgias.  Skin: Negative for pallor and rash.  Allergic/Immunologic: Negative for environmental allergies.  Neurological: Negative for dizziness, syncope and headaches.  Hematological: Negative for adenopathy. Does not bruise/bleed easily.  Psychiatric/Behavioral: Negative for decreased concentration and dysphoric mood. The patient is not nervous/anxious.        Objective:  Physical Exam Constitutional:      General: She is not in acute distress.    Appearance: Normal appearance. She is obese. She is not ill-appearing or diaphoretic.  HENT:     Mouth/Throat:     Mouth: Mucous membranes are moist.  Eyes:     General: No scleral icterus.    Conjunctiva/sclera: Conjunctivae normal.     Pupils: Pupils are equal, round, and reactive to light.  Cardiovascular:     Rate and Rhythm: Normal rate and regular rhythm.     Pulses: Normal pulses.     Heart sounds: Normal heart sounds.     Comments: No varicosities  Pulmonary:     Effort: Pulmonary effort is normal. No respiratory distress.     Breath sounds: Normal breath sounds. No wheezing or rales.  Musculoskeletal:        General: Tenderness present.     Cervical back: Normal range of motion and neck supple.     Lumbar back: No swelling, edema, deformity, spasms or tenderness. Negative right straight leg raise test and negative left straight leg raise test. No scoliosis.     Right knee: Crepitus present. No swelling, deformity, effusion or bony tenderness. No tenderness.     Right lower leg: No swelling. No edema.     Left lower leg: No swelling. No edema.     Comments: No LS tenderness  No focal weakness  Pt used cane  on the R for R leg pain   No LE edema or palpable cords  No swelling behind R knee  No joint line tenderness   Neg homan's sign bilat LEs  Crepitus with rom both knees    Skin:    General: Skin is warm and dry.     Coloration: Skin is not pale.     Findings: No erythema or rash.  Neurological:     Mental Status: She is alert.     Sensory: No sensory deficit.     Motor: No weakness.     Coordination: Coordination normal.     Deep Tendon Reflexes: Reflexes normal.  Psychiatric:        Mood and Affect: Mood normal.           Assessment & Plan:   Problem List Items Addressed This Visit      Other   Posterior knee pain, right    Acute on chronic in addition to lower leg pain  Known L5-S1 spinal stenosis  Curious to know if she also has OA of knee  Xray ordered today  Adv that glucosamine is ok to try along with acetaminophen      Relevant Orders   DG Knee 4 Views W/Patella Right   Right leg pain - Primary    Acute on chronic -about 6 months  Suspect due to neurogenic claudication- goes to [facility]  H/o L5-S1 spinal stenosis  May want to consider f/u and MRI as proposed in January (reviewed notes)  Also checking xray of knee today-pend reading

## 2019-05-21 NOTE — Assessment & Plan Note (Signed)
Acute on chronic in addition to lower leg pain  Known L5-S1 spinal stenosis  Curious to know if she also has OA of knee  Xray ordered today  Adv that glucosamine is ok to try along with acetaminophen

## 2019-05-21 NOTE — Assessment & Plan Note (Signed)
Acute on chronic -about 6 months  Suspect due to neurogenic claudication- goes to [facility]  H/o L5-S1 spinal stenosis  May want to consider f/u and MRI as proposed in January (reviewed notes)  Also checking xray of knee today-pend reading

## 2019-05-21 NOTE — Telephone Encounter (Signed)
Will see her then 

## 2019-05-22 NOTE — Telephone Encounter (Signed)
Noted. Thanks.

## 2019-06-14 ENCOUNTER — Ambulatory Visit (INDEPENDENT_AMBULATORY_CARE_PROVIDER_SITE_OTHER): Payer: Medicare Other | Admitting: Family Medicine

## 2019-06-14 ENCOUNTER — Encounter: Payer: Self-pay | Admitting: Family Medicine

## 2019-06-14 DIAGNOSIS — M25569 Pain in unspecified knee: Secondary | ICD-10-CM

## 2019-06-14 DIAGNOSIS — G8929 Other chronic pain: Secondary | ICD-10-CM

## 2019-06-14 NOTE — Progress Notes (Signed)
This visit occurred during the SARS-CoV-2 public health emergency.  Safety protocols were in place, including screening questions prior to the visit, additional usage of staff PPE, and extensive cleaning of exam room while observing appropriate contact time as indicated for disinfecting solutions.  She still has dec ROM on the L 3rd finger.  She has seen orthopedics in the meantime.  Prev seen by Dr. Glori Bickers with B knee OA.   She doesn't usually eat red meat but she was eating more beef prior to having more joint pain.  Unclear how much this is related.  She tried going off of simvastatin and then back on it (no difference with statin change).    She hasn't tried ice or knee injections yet.  She has R knee posterior pain.  She has rarely taking tylenol with some relief.    Meds, vitals, and allergies reviewed.   ROS: Per HPI unless specifically indicated in ROS section   nad ncat Crepitus on R knee ROM but joint line not ttp.  Popliteal area slightly tender but without mass.  No calf tenderness or mass noted. Able to bear weight.

## 2019-06-14 NOTE — Patient Instructions (Addendum)
Keep moving, use ice as needed for 5 minutes at a time, and take tylenol as needed.  You can try a knee sleeve as needed during the day.  Take care.  Glad to see you.

## 2019-06-18 NOTE — Assessment & Plan Note (Addendum)
Likely with pain related to underlying arthritis. Discussed options.  She can keep moving, use ice as needed for 5 minutes at a time, and take tylenol as needed. She can try a knee sleeve as needed during the day.  Update me as needed.  She agrees.

## 2019-07-10 ENCOUNTER — Telehealth: Payer: Self-pay

## 2019-07-10 NOTE — Telephone Encounter (Signed)
Pt left v/m requesting xray to see if has kidney stone; urologist cannot see her until sept. I called pt back and she said she already has appt to see Dr Damita Dunnings on 07/12/19 and nothing further needed.

## 2019-07-12 ENCOUNTER — Ambulatory Visit (INDEPENDENT_AMBULATORY_CARE_PROVIDER_SITE_OTHER): Payer: Medicare Other | Admitting: Family Medicine

## 2019-07-12 ENCOUNTER — Encounter: Payer: Self-pay | Admitting: Family Medicine

## 2019-07-12 ENCOUNTER — Other Ambulatory Visit: Payer: Self-pay

## 2019-07-12 ENCOUNTER — Ambulatory Visit (INDEPENDENT_AMBULATORY_CARE_PROVIDER_SITE_OTHER)
Admission: RE | Admit: 2019-07-12 | Discharge: 2019-07-12 | Disposition: A | Discharge status: Home / Self Care | Payer: Medicare Other | Source: Ambulatory Visit | Attending: Family Medicine | Admitting: Family Medicine

## 2019-07-12 VITALS — BP 140/76 | HR 75 | Temp 97.1°F | Ht <= 58 in | Wt 156.1 lb

## 2019-07-12 DIAGNOSIS — N200 Calculus of kidney: Secondary | ICD-10-CM

## 2019-07-12 LAB — POC URINALSYSI DIPSTICK (AUTOMATED)
Bilirubin, UA: NEGATIVE
Glucose, UA: NEGATIVE
Ketones, UA: NEGATIVE
Leukocytes, UA: NEGATIVE
Nitrite, UA: NEGATIVE
Protein, UA: POSITIVE — AB
Spec Grav, UA: >=1.03 — AB (ref 1.010–1.025)
Urobilinogen, UA: 0.2 E.U./dL
pH, UA: 6 (ref 5.0–8.0)

## 2019-07-12 NOTE — Progress Notes (Signed)
This visit occurred during the SARS-CoV-2 public health emergency.  Safety protocols were in place, including screening questions prior to the visit, additional usage of staff PPE, and extensive cleaning of exam room while observing appropriate contact time as indicated for disinfecting solutions.  Possible renal stone.  H/o similar in the past.  Had seen urology prev.  Pain started 2 days ago, R lower back pain.  Pain is resolved now but no known stone passage.  Took tylenol for the pain.  Possible blood in urine this AM.  No FCNAVD now but had vomited in the midst of pain prev.    She has h/o calcium oxalate stones, d/w pt.    Not burning with urination.   Meds, vitals, and allergies reviewed.   ROS: Per HPI unless specifically indicated in ROS section   GEN: nad, alert and oriented HEENT: ncat NECK: supple w/o LA CV: rrr. PULM: ctab, no inc wob ABD: soft, +bs EXT: no edema SKIN: well perfused.

## 2019-07-12 NOTE — Patient Instructions (Signed)
Go to the lab on the way out (urine and xray).   If you have mychart we'll likely use that to update you.     Drink enough water to keep your urine clear.   We'll go from there.   Take care.  Glad to see you.

## 2019-07-13 LAB — URINE CULTURE
ISOLATE 1:: NO GROWTH
MICRO NUMBER:: 10630428
SPECIMEN QUALITY:: ADEQUATE

## 2019-07-15 NOTE — Assessment & Plan Note (Signed)
History of.  Possible blood in urine this AM.  No fevers or chills now but had vomited previously in the midst of a pain flare.  History of calcium oxalate stones noted.  Check KUB and urinalysis/urine culture today.  See notes on labs.  At this point still okay for outpatient follow-up.  Increase p.o. fluids.  Update me as needed.

## 2019-07-30 DIAGNOSIS — N200 Calculus of kidney: Secondary | ICD-10-CM | POA: Diagnosis not present

## 2019-10-03 DIAGNOSIS — N202 Calculus of kidney with calculus of ureter: Secondary | ICD-10-CM | POA: Diagnosis not present

## 2019-10-03 DIAGNOSIS — N201 Calculus of ureter: Secondary | ICD-10-CM | POA: Diagnosis not present

## 2019-10-03 DIAGNOSIS — N200 Calculus of kidney: Secondary | ICD-10-CM | POA: Diagnosis not present

## 2019-10-03 DIAGNOSIS — N281 Cyst of kidney, acquired: Secondary | ICD-10-CM | POA: Diagnosis not present

## 2019-10-03 DIAGNOSIS — N134 Hydroureter: Secondary | ICD-10-CM | POA: Diagnosis not present

## 2019-10-04 DIAGNOSIS — I517 Cardiomegaly: Secondary | ICD-10-CM | POA: Diagnosis not present

## 2019-10-09 DIAGNOSIS — N132 Hydronephrosis with renal and ureteral calculous obstruction: Secondary | ICD-10-CM | POA: Diagnosis not present

## 2019-10-09 DIAGNOSIS — N200 Calculus of kidney: Secondary | ICD-10-CM | POA: Diagnosis not present

## 2019-10-17 ENCOUNTER — Other Ambulatory Visit: Payer: Self-pay | Admitting: Family Medicine

## 2019-10-23 DIAGNOSIS — N200 Calculus of kidney: Secondary | ICD-10-CM | POA: Diagnosis not present

## 2019-10-26 ENCOUNTER — Telehealth: Payer: Self-pay

## 2019-10-26 NOTE — Telephone Encounter (Signed)
Patient contacted the office and states that he CPE is not until December - but she would like to have a lab appt within the coming weeks to have her cholesterol checked. Dr. Damita Dunnings, is this ok?

## 2019-10-29 ENCOUNTER — Other Ambulatory Visit: Payer: Self-pay | Admitting: Family Medicine

## 2019-10-29 DIAGNOSIS — E611 Iron deficiency: Secondary | ICD-10-CM

## 2019-10-29 DIAGNOSIS — M810 Age-related osteoporosis without current pathological fracture: Secondary | ICD-10-CM

## 2019-10-29 DIAGNOSIS — I10 Essential (primary) hypertension: Secondary | ICD-10-CM

## 2019-10-29 DIAGNOSIS — R739 Hyperglycemia, unspecified: Secondary | ICD-10-CM

## 2019-10-29 NOTE — Telephone Encounter (Signed)
Ms. Korber notified as instructed by telephone. Fasting lab appointment scheduled for 1019/2021 at 9:15 am.

## 2019-10-29 NOTE — Telephone Encounter (Signed)
I do not think it makes sense to schedule an extra trip for labs now and then for labs before the physical.  If she wants to get labs done now, then I would get all of her labs done for the physical.  I put in all of the orders.  Make sure she is fasting for the lab visit.  Thanks.

## 2019-10-30 ENCOUNTER — Ambulatory Visit: Payer: Medicare Other | Attending: Internal Medicine

## 2019-10-30 DIAGNOSIS — Z23 Encounter for immunization: Secondary | ICD-10-CM

## 2019-10-30 NOTE — Progress Notes (Signed)
   Covid-19 Vaccination Clinic  Name:  Patricia Hoover    MRN: [id number] DOB: [birth year]  10/30/2019  [name] was observed post Covid-19 immunization for 15 minutes without incident. She was provided with Vaccine Information Sheet and instruction to access the V-Safe system.   [name] was instructed to call 911 with any severe reactions post vaccine: Marland Kitchen Difficulty breathing  . Swelling of face and throat  . A fast heartbeat  . A bad rash all over body  . Dizziness and weakness

## 2019-11-06 ENCOUNTER — Other Ambulatory Visit (INDEPENDENT_AMBULATORY_CARE_PROVIDER_SITE_OTHER): Payer: Medicare Other

## 2019-11-06 ENCOUNTER — Other Ambulatory Visit: Payer: Self-pay

## 2019-11-06 DIAGNOSIS — E611 Iron deficiency: Secondary | ICD-10-CM | POA: Diagnosis not present

## 2019-11-06 DIAGNOSIS — R739 Hyperglycemia, unspecified: Secondary | ICD-10-CM | POA: Diagnosis not present

## 2019-11-06 DIAGNOSIS — M810 Age-related osteoporosis without current pathological fracture: Secondary | ICD-10-CM | POA: Diagnosis not present

## 2019-11-06 DIAGNOSIS — I10 Essential (primary) hypertension: Secondary | ICD-10-CM | POA: Diagnosis not present

## 2019-11-06 LAB — COMPREHENSIVE METABOLIC PANEL
ALT: 7 U/L (ref 0–35)
AST: 11 U/L (ref 0–37)
Albumin: 4.1 g/dL (ref 3.5–5.2)
Alkaline Phosphatase: 82 U/L (ref 39–117)
BUN: 11 mg/dL (ref 6–23)
CO2: 28 mEq/L (ref 19–32)
Calcium: 9.1 mg/dL (ref 8.4–10.5)
Chloride: 105 mEq/L (ref 96–112)
Creatinine, Ser: 0.57 mg/dL (ref 0.40–1.20)
GFR: 90.64 mL/min (ref >60.00)
Glucose, Bld: 97 mg/dL (ref 70–99)
Potassium: 4 mEq/L (ref 3.5–5.1)
Sodium: 141 mEq/L (ref 135–145)
Total Bilirubin: 0.5 mg/dL (ref 0.2–1.2)
Total Protein: 6.6 g/dL (ref 6.0–8.3)

## 2019-11-06 LAB — CBC WITH DIFFERENTIAL/PLATELET
Basophils Absolute: 0 10*3/uL (ref 0.0–0.1)
Basophils Relative: 0.6 % (ref 0.0–3.0)
Eosinophils Absolute: 0.1 10*3/uL (ref 0.0–0.7)
Eosinophils Relative: 1.9 % (ref 0.0–5.0)
HCT: 40.2 % (ref 36.0–46.0)
Hemoglobin: 13.6 g/dL (ref 12.0–15.0)
Lymphocytes Relative: 32.4 % (ref 12.0–46.0)
Lymphs Abs: 2.6 10*3/uL (ref 0.7–4.0)
MCHC: 33.7 g/dL (ref 30.0–36.0)
MCV: 87.2 fl (ref 78.0–100.0)
Monocytes Absolute: 0.6 10*3/uL (ref 0.1–1.0)
Monocytes Relative: 6.9 % (ref 3.0–12.0)
Neutro Abs: 4.7 10*3/uL (ref 1.4–7.7)
Neutrophils Relative %: 58.2 % (ref 43.0–77.0)
Platelets: 243 10*3/uL (ref 150.0–400.0)
RBC: 4.6 Mil/uL (ref 3.87–5.11)
RDW: 13.8 % (ref 11.5–15.5)
WBC: 8 10*3/uL (ref 4.0–10.5)

## 2019-11-06 LAB — LIPID PANEL
Cholesterol: 218 mg/dL — ABNORMAL HIGH (ref 0–200)
HDL: 55.3 mg/dL (ref >39.00)
LDL Cholesterol: 144 mg/dL — ABNORMAL HIGH (ref 0–99)
NonHDL: 163.18
Total CHOL/HDL Ratio: 4
Triglycerides: 94 mg/dL (ref 0.0–149.0)
VLDL: 18.8 mg/dL (ref 0.0–40.0)

## 2019-11-06 LAB — HEMOGLOBIN A1C: Hgb A1c MFr Bld: 6.3 % (ref 4.6–6.5)

## 2019-11-06 LAB — VITAMIN D 25 HYDROXY (VIT D DEFICIENCY, FRACTURES): VITD: 30.7 ng/mL (ref 30.00–100.00)

## 2019-11-06 LAB — IRON: Iron: 91 ug/dL (ref 42–145)

## 2019-11-14 ENCOUNTER — Telehealth: Payer: Self-pay

## 2019-11-14 NOTE — Telephone Encounter (Signed)
She can get the flu shot at any point.  I would get the high dose since she is above 65.  If she wants to wait 2 weeks between the Covid and flu shots that would be fine.  I would wait 2 weeks after the flu shot before getting the first shingles shot.  She will need to wait 2 months between the 2 doses of the shingles shot. Thanks.

## 2019-11-14 NOTE — Telephone Encounter (Signed)
Patent notified as instructed by telephone and verbalized understanding. Patient stated that she would like to get the flu shot at the office. Patient is going to check her card as to the date that she had her covid vaccine. Patient stated that she will call the office back tomorrow and schedule her flu shot.

## 2019-11-14 NOTE — Telephone Encounter (Signed)
Pt took 3rd pfizer covid vaccine on 10/30/19; pt wants to know if there is a time in between getting flu shot, shingrix shot and the covid booster. Pt does not remember what flu shot she got last year; pt did not get at [facility] and pt wants to know if should get regular flu shot or high dose flu shot 65+.

## 2019-11-28 ENCOUNTER — Other Ambulatory Visit: Payer: Self-pay | Admitting: Family Medicine

## 2019-11-28 DIAGNOSIS — Z1231 Encounter for screening mammogram for malignant neoplasm of breast: Secondary | ICD-10-CM

## 2019-12-12 ENCOUNTER — Emergency Department: Payer: Medicare Other

## 2019-12-12 ENCOUNTER — Emergency Department
Admission: EM | Admit: 2019-12-12 | Discharge: 2019-12-12 | Disposition: A | Discharge status: Home / Self Care | Payer: Medicare Other | Attending: Emergency Medicine | Admitting: Emergency Medicine

## 2019-12-12 ENCOUNTER — Telehealth: Payer: Self-pay

## 2019-12-12 ENCOUNTER — Other Ambulatory Visit: Payer: Self-pay

## 2019-12-12 DIAGNOSIS — Z79899 Other long term (current) drug therapy: Secondary | ICD-10-CM | POA: Insufficient documentation

## 2019-12-12 DIAGNOSIS — I10 Essential (primary) hypertension: Secondary | ICD-10-CM | POA: Insufficient documentation

## 2019-12-12 DIAGNOSIS — K649 Unspecified hemorrhoids: Secondary | ICD-10-CM | POA: Diagnosis not present

## 2019-12-12 DIAGNOSIS — D1809 Hemangioma of other sites: Secondary | ICD-10-CM | POA: Diagnosis not present

## 2019-12-12 DIAGNOSIS — K922 Gastrointestinal hemorrhage, unspecified: Secondary | ICD-10-CM | POA: Diagnosis not present

## 2019-12-12 DIAGNOSIS — Z87891 Personal history of nicotine dependence: Secondary | ICD-10-CM | POA: Diagnosis not present

## 2019-12-12 DIAGNOSIS — N281 Cyst of kidney, acquired: Secondary | ICD-10-CM

## 2019-12-12 DIAGNOSIS — N200 Calculus of kidney: Secondary | ICD-10-CM | POA: Diagnosis not present

## 2019-12-12 DIAGNOSIS — N202 Calculus of kidney with calculus of ureter: Secondary | ICD-10-CM | POA: Diagnosis not present

## 2019-12-12 DIAGNOSIS — K644 Residual hemorrhoidal skin tags: Secondary | ICD-10-CM | POA: Diagnosis not present

## 2019-12-12 DIAGNOSIS — Z8719 Personal history of other diseases of the digestive system: Secondary | ICD-10-CM | POA: Diagnosis not present

## 2019-12-12 LAB — COMPREHENSIVE METABOLIC PANEL
ALT: 8 U/L (ref 0–44)
AST: 14 U/L — ABNORMAL LOW (ref 15–41)
Albumin: 3.7 g/dL (ref 3.5–5.0)
Alkaline Phosphatase: 86 U/L (ref 38–126)
Anion gap: 9 (ref 5–15)
BUN: 17 mg/dL (ref 8–23)
CO2: 24 mmol/L (ref 22–32)
Calcium: 8.9 mg/dL (ref 8.9–10.3)
Chloride: 108 mmol/L (ref 98–111)
Creatinine, Ser: 0.62 mg/dL (ref 0.44–1.00)
GFR, Estimated: >60 mL/min (ref >60)
Glucose, Bld: 112 mg/dL — ABNORMAL HIGH (ref 70–99)
Potassium: 3.5 mmol/L (ref 3.5–5.1)
Sodium: 141 mmol/L (ref 135–145)
Total Bilirubin: 0.5 mg/dL (ref 0.3–1.2)
Total Protein: 6.7 g/dL (ref 6.5–8.1)

## 2019-12-12 LAB — CBC
HCT: 37.2 % (ref 36.0–46.0)
Hemoglobin: 12.8 g/dL (ref 12.0–15.0)
MCH: 29.9 pg (ref 26.0–34.0)
MCHC: 34.4 g/dL (ref 30.0–36.0)
MCV: 86.9 fL (ref 80.0–100.0)
Platelets: 282 10*3/uL (ref 150–400)
RBC: 4.28 MIL/uL (ref 3.87–5.11)
RDW: 13.5 % (ref 11.5–15.5)
WBC: 9.9 10*3/uL (ref 4.0–10.5)
nRBC: 0 % (ref 0.0–0.2)

## 2019-12-12 LAB — TYPE AND SCREEN
ABO/RH(D): O POS
Antibody Screen: NEGATIVE

## 2019-12-12 MED ORDER — PANTOPRAZOLE SODIUM 40 MG PO TBEC: 40.0000 mg | DELAYED_RELEASE_TABLET | Freq: Every day | ORAL | 0 refills | Status: DC

## 2019-12-12 MED ORDER — IOHEXOL 350 MG/ML SOLN
100.0000 mL | Freq: Once | INTRAVENOUS | Status: AC | PRN
  Administered 2019-12-12: 100 mL via INTRAVENOUS

## 2019-12-12 NOTE — ED Notes (Signed)
Back from CT

## 2019-12-12 NOTE — ED Provider Notes (Signed)
[facility] Emergency Department Provider Note  ____________________________________________   First MD Initiated Contact with Patient 12/12/19 1807     (approximate)  I have reviewed the triage vital signs and the nursing notes.   HISTORY  Chief Complaint GI Bleeding   HPI Patricia Hoover is a 74 y.o. female with a past medical history of duodenal ulcer, colonic polyps, and hemorrhoids as well as osteoporosis and kidney stones who presents for assessment of several episodes of bleeding with bowel movements that occurred earlier today.  Patient describes episodes as dark red.  There is no bright red blood.  She states she is only bleeding when she is having bowel movements.  She denies any urinary symptoms, abdominal pain, back pain, nausea, vomiting, diarrhea, dysuria, chest pain, cough, shortness of breath, rash, or any other acute complaints.  She is not anticoagulated and does not take any NSAIDs or drink alcohol regularly.  She states she is not sure if it is from her hemorrhoids or not.  No other acute concerns at this time.  Denies any trauma.         Past Medical History:  Diagnosis Date  . Arrhythmia    possible hx, resolved prev  . Blood transfusion without reported diagnosis 1972   had reaction; had to stop  . Carpal tunnel syndrome   . Cataract 2019   resolved with surgery  . Duodenal ulcer   . H/O exercise stress test 2012   normal  . Heart murmur    as child  . High cholesterol   . History of kidney stones   . Hx of colonic polyps   . Hypertension   . Osteoporosis 2010   t score - 3.9  . Renal stones     Patient Active Problem List   Diagnosis Date Noted  . Posterior knee pain, right 05/21/2019  . Right leg pain 05/21/2019  . Eustachian tube dysfunction 05/02/2019  . Healthcare maintenance 01/08/2018  . Paresthesia 01/08/2018  . Neck pain 08/31/2017  . Angioedema 06/15/2017  . Peptic ulcer of duodenum   . Other specified  diseases of esophagus   . Hematochezia   . External hemorrhoids   . Diverticulosis of large intestine without diverticulitis   . Internal hemorrhoids   . Duodenal ulceration   . Stomach irritation   . GIB (gastrointestinal bleeding) 02/28/2017  . Abnormal stools 02/27/2017  . Advance care planning 12/23/2016  . Renal stone 04/20/2016  . Heartburn 12/17/2015  . Medicare annual wellness visit, subsequent 06/05/2013  . Hyperglycemia 06/05/2013  . Knee pain 08/31/2012  . Osteoporosis 08/18/2012  . HTN (hypertension) 08/09/2012  . HLD (hyperlipidemia) 08/09/2012    Past Surgical History:  Procedure Laterality Date  . ABDOMINAL HYSTERECTOMY  1972   for endometriosis  . CATARACT EXTRACTION W/ INTRAOCULAR LENS IMPLANT Right 06/2017  . CATARACT EXTRACTION W/ INTRAOCULAR LENS IMPLANT Left 07/2017  . COLONOSCOPY Left 03/02/2017   Procedure: COLONOSCOPY;  Surgeon: Virgel Manifold, MD;  Location: [facility] ENDOSCOPY;  Service: Endoscopy;  Laterality: Left;  . COLONOSCOPY WITH PROPOFOL N/A 05/04/2016   Procedure: COLONOSCOPY WITH PROPOFOL;  Surgeon: Jonathon Bellows, MD;  Location: [facility] ENDOSCOPY;  Service: Endoscopy;  Laterality: N/A;  . ESOPHAGOGASTRODUODENOSCOPY Left 03/02/2017   Procedure: ESOPHAGOGASTRODUODENOSCOPY (EGD);  Surgeon: Virgel Manifold, MD;  Location: [facility] ENDOSCOPY;  Service: Endoscopy;  Laterality: Left;  . ESOPHAGOGASTRODUODENOSCOPY (EGD) WITH PROPOFOL N/A 04/08/2017   Procedure: ESOPHAGOGASTRODUODENOSCOPY (EGD) WITH PROPOFOL;  Surgeon: Virgel Manifold, MD;  Location: [facility]  ENDOSCOPY;  Service: Endoscopy;  Laterality: N/A;  . LITHOTRIPSY     for kidney stone x5  . LITHOTRIPSY  09/2017    Prior to Admission medications   Medication Sig Start Date End Date Taking? Authorizing Provider  amLODipine (NORVASC) 2.5 MG tablet TAKE 1 TABLET DAILY 10/17/19   Tonia Ghent, MD  Ascorbic Acid (VITAMIN C GUMMIE PO) Take by mouth.    [provider]  cholecalciferol  (VITAMIN D) 1000 units tablet Take 2 tablets (2,000 Units total) by mouth daily. 12/16/15   Tonia Ghent, MD  ELDERBERRY PO Take by mouth.    [provider]  fluticasone (FLONASE) 50 MCG/ACT nasal spray Place 2 sprays into both nostrils daily. 05/01/19   Tonia Ghent, MD  NON FORMULARY Turneric and Ginger Tea    [provider]  pantoprazole (PROTONIX) 40 MG tablet Take 1 tablet (40 mg total) by mouth daily. 12/12/19 01/11/20  Lucrezia Starch, MD  simvastatin (ZOCOR) 10 MG tablet Take 1 tablet (10 mg total) by mouth every other day. 12/19/18   Tonia Ghent, MD    Allergies Ace inhibitors, Angiotensin receptor blockers, Apple, Aspirin, Bisphosphonates, Nsaids, and Prolia [denosumab]  Family History  Problem Relation Age of Onset  . Heart disease Mother   . Renal Disease Mother   . Heart failure Mother   . Colon cancer Neg Hx   . Breast cancer Neg Hx     Social History Social History   Tobacco Use  . Smoking status: Former Smoker    Types: Cigarettes    Quit date: 01/19/1991    Years since quitting: 28.9  . Smokeless tobacco: Never Used  Vaping Use  . Vaping Use: Never used  Substance Use Topics  . Alcohol use: No    Comment: rare wine  . Drug use: No    Review of Systems  Review of Systems  Constitutional: Negative for chills and fever.  HENT: Negative for sore throat.   Eyes: Negative for pain.  Respiratory: Negative for cough and stridor.   Cardiovascular: Negative for chest pain.  Gastrointestinal: Positive for blood in stool and melena. Negative for vomiting.  Genitourinary: Negative for dysuria.  Musculoskeletal: Negative for myalgias.  Skin: Negative for rash.  Neurological: Negative for seizures, loss of consciousness and headaches.  Psychiatric/Behavioral: Negative for suicidal ideas.  All other systems reviewed and are negative.     ____________________________________________   PHYSICAL EXAM:  VITAL SIGNS: ED Triage  Vitals  Enc Vitals Group     BP 12/12/19 1612 (!) 158/82     Pulse Rate 12/12/19 1612 70     Resp 12/12/19 1612 18     Temp 12/12/19 1612 98.6 F (37 C)     Temp Source 12/12/19 1612 Oral     SpO2 12/12/19 1612 99 %     Weight 12/12/19 1613 155 lb (70.3 kg)     Height 12/12/19 1613 4\' 9"  (1.448 m)     Head Circumference --      Peak Flow --      Pain Score 12/12/19 1613 0     Pain Loc --      Pain Edu? --      Excl. in [place]? --    Vitals:   12/12/19 1612  BP: (!) 158/82  Pulse: 70  Resp: 18  Temp: 98.6 F (37 C)  SpO2: 99%   Physical Exam Vitals and nursing note reviewed.  Constitutional:  General: She is not in acute distress.    Appearance: She is well-developed.  HENT:     Head: Normocephalic and atraumatic.     Right Ear: External ear normal.     Left Ear: External ear normal.     Nose: Nose normal.  Eyes:     Conjunctiva/sclera: Conjunctivae normal.  Cardiovascular:     Rate and Rhythm: Normal rate and regular rhythm.     Pulses: Normal pulses.     Heart sounds: No murmur heard.   Pulmonary:     Effort: Pulmonary effort is normal. No respiratory distress.     Breath sounds: Normal breath sounds.  Abdominal:     Palpations: Abdomen is soft.     Tenderness: There is no abdominal tenderness.  Genitourinary:    Rectum: Guaiac result positive. External hemorrhoid present. No anal fissure.  Musculoskeletal:     Cervical back: Neck supple.  Skin:    General: Skin is warm and dry.     Capillary Refill: Capillary refill takes less than 2 seconds.  Neurological:     General: No focal deficit present.     Mental Status: She is alert.  Psychiatric:        Mood and Affect: Mood normal.      ____________________________________________   LABS (all labs ordered are listed, but only abnormal results are displayed)  Labs Reviewed  COMPREHENSIVE METABOLIC PANEL - Abnormal; Notable for the following components:      Result Value   Glucose, Bld 112 (*)     AST 14 (*)    All other components within normal limits  CBC  POC OCCULT BLOOD, ED  TYPE AND SCREEN   ____________________________________________  ____________________________________________  RADIOLOGY   Official radiology report(s): CT Angio Abd/Pel W and/or Wo Contrast  Result Date: 12/12/2019 CLINICAL DATA:  GI bleeding EXAM: CTA ABDOMEN AND PELVIS WITHOUT AND WITH CONTRAST TECHNIQUE: Multidetector CT imaging of the abdomen and pelvis was performed using the standard protocol during bolus administration of intravenous contrast. Multiplanar reconstructed images and MIPs were obtained and reviewed to evaluate the vascular anatomy. CONTRAST:  173mL OMNIPAQUE IOHEXOL 350 MG/ML SOLN COMPARISON:  CT dated 10/03/2019 FINDINGS: VASCULAR Aorta: There is no abdominal aortic aneurysm. There are atherosclerotic changes of the abdominal aorta. Celiac: Patent without evidence of aneurysm, dissection, vasculitis or significant stenosis. SMA: Patent without evidence of aneurysm, dissection, vasculitis or significant stenosis. Renals: Both renal arteries are patent without evidence of aneurysm, dissection, vasculitis, fibromuscular dysplasia or significant stenosis. IMA: Patent without evidence of aneurysm, dissection, vasculitis or significant stenosis. Inflow: Patent without evidence of aneurysm, dissection, vasculitis or significant stenosis. Proximal Outflow: Bilateral common femoral and visualized portions of the superficial and profunda femoral arteries are patent without evidence of aneurysm, dissection, vasculitis or significant stenosis. Veins: No obvious venous abnormality within the limitations of this arterial phase study. Review of the MIP images confirms the above findings. NON-VASCULAR Lower chest: The lung bases are clear. The heart is enlarged. Hepatobiliary: There is a small hepatic hemangioma in hepatic segment 8. normal gallbladder.There is no biliary ductal dilation. Pancreas: Normal  contours without ductal dilatation. No peripancreatic fluid collection. Spleen: Unremarkable. Adrenals/Urinary Tract: --Adrenal glands: Unremarkable. --Right kidney/ureter: Again noted is a massive right renal cyst measuring approximately 12 by 11 cm. Multiple peripelvic cysts are noted on the right. There are multiple nonobstructing stones throughout the right kidney measuring up to approximately 1 cm in the lower pole. --Left kidney/ureter: Multiple left-sided peripelvic cysts are noted. --Urinary  bladder: Unremarkable. Stomach/Bowel: --Stomach/Duodenum: No hiatal hernia or other gastric abnormality. Normal duodenal course and caliber. --Small bowel: Unremarkable. --Colon: Rectosigmoid diverticulosis without acute inflammation. --Appendix: Not visualized. No right lower quadrant inflammation or free fluid. Lymphatic: --No retroperitoneal lymphadenopathy. --No mesenteric lymphadenopathy. --No pelvic or inguinal lymphadenopathy. Reproductive: Status post hysterectomy. No adnexal mass. Other: No ascites or free air. The abdominal wall is normal. Musculoskeletal. 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 IMPRESSION: 1. No evidence for active GI bleeding. 2. Sigmoid diverticulosis without CT evidence for diverticulitis. 3. Nonobstructing right-sided nephrolithiasis. 4. Massive right renal cyst again noted. If this is symptomatic, percutaneous drainage can be considered. 5. Bilateral peripelvic cysts are noted. Aortic Atherosclerosis (ICD10-I70.0). Electronically Signed   By: Constance Holster M.D.   On: 12/12/2019 18:56    ____________________________________________   PROCEDURES  Procedure(s) performed (including Critical Care):  .1-3 Lead EKG Interpretation Performed by: Lucrezia Starch, MD Authorized by: Lucrezia Starch, MD     Interpretation: normal     ECG rate assessment: normal     Rhythm: sinus rhythm     Ectopy: none      Conduction: normal       ____________________________________________   INITIAL IMPRESSION / ASSESSMENT AND PLAN / ED COURSE      Patient presents with [place] to history exam for assessment of some bleeding associate with bowel movements earlier today. She describes the bleeding as dark red. She states it was very similar to hemorrhoid bleeding she has had in the past. On arrival she is afebrile hemodynamically stable. Exam as above remarkable for external hemorrhoid that is not actively bleeding and otherwise Hemoccult positive stool.  CMP obtained is unremarkable for any significant electrolyte or metabolic derangements. BUN is nonelevated 17 overall presentation is less consistent with an upper GI bleed and lower GI bleed. CBC is unremarkable with hemoglobin of 12.8. CT obtained shows no evidence of active bleeding but does show known diverticuli and nephrolithiasis as incidental large renal cyst.  Given stable vital signs with normal hemoglobin and no active bleeding on exam with patient stating she felt good and is requesting to leave and has GI follow-up with her gastroenterologist I think discharge is safe at this time with plan for close outpatient GI follow-up. Advised patient to continue avoiding alcohol and NSAIDs and Rx was written for Protonix. I suspect patient may have been bleeding from her external hemorrhoid noted on exam although none was visualized. However it is also possible she had some diverticular bleeding and may require colonoscopy in the near future. Explained all this to the patient and patient was amenable to plan as well as agreed to return immediately to the emergency room should she experience any recurrence of bleeding, headache, chest pain, shortness of breath or lightheadedness.  ____________________________________________   FINAL CLINICAL IMPRESSION(S) / ED DIAGNOSES  Final diagnoses:  Gastrointestinal hemorrhage, unspecified gastrointestinal hemorrhage type    Kidney stone  Renal cyst  Hemorrhoids, unspecified hemorrhoid type    Medications  iohexol (OMNIPAQUE) 350 MG/ML injection 100 mL (100 mLs Intravenous Contrast Given 12/12/19 1830)     ED Discharge Orders         Ordered    pantoprazole (PROTONIX) 40 MG tablet  Daily        12/12/19 1900           Note:  This document was prepared using Dragon voice recognition software and may include unintentional dictation errors.  Lucrezia Starch, MD 12/12/19 [place]

## 2019-12-12 NOTE — Telephone Encounter (Signed)
I spoke with pt; pt states she is having loose stools often with lots of bright red blood in stools; pt said last time this happened pt went to ED and was concerned about hemorrhaging; pt is not calling 911 but her daughter is taking her to ED now. FYI to Dr Damita Dunnings.

## 2019-12-12 NOTE — ED Notes (Signed)
Family at bedside. 

## 2019-12-12 NOTE — Telephone Encounter (Signed)
Will await ED report.  Thanks.

## 2019-12-12 NOTE — ED Notes (Signed)
ED Provider at bedside. 

## 2019-12-12 NOTE — ED Notes (Signed)
Pt calm , collective , denied pain or sob. Pt understood discharge instructions  

## 2019-12-12 NOTE — ED Notes (Signed)
Patient transported to CT 

## 2019-12-12 NOTE — ED Triage Notes (Signed)
Pt here via POV from home with c/o five episodes of dark red stool today, pt with history of the same and was previously diagnosed with hemorrhoids, did not require a blood transfusion but was kept in the hospital for observation in case one was needed. Pt denies shortness of breath/ pain/ or weakness.

## 2019-12-12 NOTE — Telephone Encounter (Signed)
[place] Night - Client Nonclinical Telephone Record [facility] Client [place] Night - Client Client Site [place] - Night Contact Type Call Who Is Calling Patient / Member / Family / Caregiver Caller Name Declined to provide Caller Phone Number [phone] Patient Name Patricia Hoover Patient DOB 10-29-1945 Call Type Message Only Information Provided Reason for Call Request to Speak to a Physician Initial Comment Caller states she has blood in her stool and is going to the er. Disp. Time Disposition Final User 12/12/2019 8:01:10 AM General Information Provided Yes Domingo Dimes, [place] Call Closed By: Memory Argue Transaction Date/Time: 12/12/2019 7:59:49 AM (ET)

## 2019-12-17 ENCOUNTER — Telehealth: Payer: Self-pay | Admitting: Gastroenterology

## 2019-12-25 ENCOUNTER — Ambulatory Visit: Payer: Medicare Other | Admitting: Family Medicine

## 2019-12-25 DIAGNOSIS — Z0289 Encounter for other administrative examinations: Secondary | ICD-10-CM

## 2019-12-31 ENCOUNTER — Other Ambulatory Visit: Payer: Self-pay

## 2019-12-31 ENCOUNTER — Ambulatory Visit (INDEPENDENT_AMBULATORY_CARE_PROVIDER_SITE_OTHER): Payer: Medicare Other | Admitting: Family Medicine

## 2019-12-31 ENCOUNTER — Encounter: Payer: Self-pay | Admitting: Family Medicine

## 2019-12-31 VITALS — BP 152/72 | HR 79 | Temp 96.5°F | Ht <= 58 in | Wt 155.7 lb

## 2019-12-31 DIAGNOSIS — K625 Hemorrhage of anus and rectum: Secondary | ICD-10-CM | POA: Diagnosis not present

## 2019-12-31 DIAGNOSIS — R195 Other fecal abnormalities: Secondary | ICD-10-CM

## 2019-12-31 LAB — CBC WITH DIFFERENTIAL/PLATELET
Basophils Absolute: 0.1 10*3/uL (ref 0.0–0.1)
Basophils Relative: 0.9 % (ref 0.0–3.0)
Eosinophils Absolute: 0.1 10*3/uL (ref 0.0–0.7)
Eosinophils Relative: 1.4 % (ref 0.0–5.0)
HCT: 38.2 % (ref 36.0–46.0)
Hemoglobin: 12.9 g/dL (ref 12.0–15.0)
Lymphocytes Relative: 30.6 % (ref 12.0–46.0)
Lymphs Abs: 2.4 10*3/uL (ref 0.7–4.0)
MCHC: 33.8 g/dL (ref 30.0–36.0)
MCV: 87.4 fl (ref 78.0–100.0)
Monocytes Absolute: 0.4 10*3/uL (ref 0.1–1.0)
Monocytes Relative: 5.4 % (ref 3.0–12.0)
Neutro Abs: 4.9 10*3/uL (ref 1.4–7.7)
Neutrophils Relative %: 61.7 % (ref 43.0–77.0)
Platelets: 255 10*3/uL (ref 150.0–400.0)
RBC: 4.37 Mil/uL (ref 3.87–5.11)
RDW: 13.9 % (ref 11.5–15.5)
WBC: 7.9 10*3/uL (ref 4.0–10.5)

## 2019-12-31 MED ORDER — PANTOPRAZOLE SODIUM 40 MG PO TBEC
40.0000 mg | DELAYED_RELEASE_TABLET | Freq: Every day | ORAL | 1 refills | Status: DC
Start: 2019-12-31 — End: 2020-02-22

## 2019-12-31 NOTE — Assessment & Plan Note (Signed)
With black and red stools.  Presumed from bleeding from hemorrhoid but would start PPI, check CBC and have her f/u with GI re: black stools.  Okay for outpatient f/u.  nsaid cautions d/w pt.  She agrees with plan.

## 2019-12-31 NOTE — Progress Notes (Signed)
This visit occurred during the SARS-CoV-2 public health emergency.  Safety protocols were in place, including screening questions prior to the visit, additional usage of staff PPE, and extensive cleaning of exam room while observing appropriate contact time as indicated for disinfecting solutions.  Hemorrhoids.  She is off PPI in the meantime, hadn't started.  Med list updated.  ER eval for blood in stool.   CT done at ER.   1. No evidence for active GI bleeding. 2. Sigmoid diverticulosis without CT evidence for diverticulitis. 3. Nonobstructing right-sided nephrolithiasis. 4. Massive right renal cyst again noted. If this is symptomatic, percutaneous drainage can be considered. 5. Bilateral peripelvic cysts are noted.  Aortic Atherosclerosis (ICD10-I70.0).  She only had bleeding with BM.  She had had black then bright red blood with stools.  H/o hemorrhoids.  HGB 12.8. no blood thinners.  No aspirin.  No nsaids.  takking tylenol prn for pain.  She can feel the hemorrhoid.  It was getting smaller, then larger again in the meantime.    H/o EGD with prev bx: DIAGNOSIS:  A. 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  - NEGATIVE FOR INTRA-EPITHELIAL LYMPHOCYTOSIS, DYSPLASIA AND MALIGNANCY.   B. STOMACH, ANTRUM, BODY AND INCISURA; COLD BIOPSY:  - ANTRAL MUCOSA WITH MILD CHRONIC GASTRITIS AND FOCAL MILD ACTIVITY.  - OXYNTIC MUCOSA WITH MILD CHRONIC GASTRITIS.  - NEGATIVE FOR H. PYLORI, DYSPLASIA AND MALIGNANCY.   She has GI f/u pending for the 16th.   Meds, vitals, and allergies reviewed.   ROS: Per HPI unless specifically indicated in ROS section   nad ncat Neck supple, no LA rrr ctab abd soft, not ttp Normal BS Ext w/o edema.  Skin well perfused.  Chaperoned exam w/soft nonthrombosed and nontender ext hemorrhoid noted w/o bleeding.

## 2019-12-31 NOTE — Patient Instructions (Signed)
Start pantoprazole in the meantime.  Go to the lab on the way out.   If you have mychart we'll likely use that to update you.    Keep the follow up with GI.  Don't take aspirin or aleve or ibuprofen.  Take care.  Glad to see you.

## 2020-01-02 NOTE — Telephone Encounter (Signed)
open encounter in error 

## 2020-01-03 ENCOUNTER — Telehealth: Payer: Self-pay | Admitting: Gastroenterology

## 2020-01-03 ENCOUNTER — Other Ambulatory Visit: Payer: Self-pay

## 2020-01-03 ENCOUNTER — Telehealth: Payer: Self-pay

## 2020-01-03 ENCOUNTER — Ambulatory Visit (INDEPENDENT_AMBULATORY_CARE_PROVIDER_SITE_OTHER): Payer: Medicare Other | Admitting: Gastroenterology

## 2020-01-03 ENCOUNTER — Encounter: Payer: Self-pay | Admitting: Gastroenterology

## 2020-01-03 VITALS — BP 186/67 | HR 87 | Temp 98.5°F | Ht <= 58 in | Wt 156.0 lb

## 2020-01-03 DIAGNOSIS — K649 Unspecified hemorrhoids: Secondary | ICD-10-CM | POA: Diagnosis not present

## 2020-01-03 MED ORDER — HYDROCORTISONE ACETATE 25 MG RE SUPP: 25.0000 mg | Freq: Two times a day (BID) | RECTAL | 0 refills | Status: AC

## 2020-01-03 NOTE — Telephone Encounter (Signed)
Pt says she cannot afford her meds, is there another alternative that would be cheaper for her?

## 2020-01-03 NOTE — Addendum Note (Signed)
Addended by: Wayna Chalet on: 01/03/2020 04:11 PM   Modules accepted: Orders

## 2020-01-03 NOTE — Telephone Encounter (Signed)
Referral was faxed to Fax # [phone] and they will contact the patient with an appointment for a consult. Phone # [phone]

## 2020-01-03 NOTE — Progress Notes (Signed)
[name], MD [address]  [place]  [place], [place] [address]  Main: [phone]  Fax: [phone]   Primary Care Physician: Tonia Ghent, MD   Chief Complaint  Patient presents with  . Hospitalization Follow-up    HPI: Patricia Hoover is a 74 y.o. female who went to the ER in November 2021 due to bright red blood per rectum.  Due to resolution of symptoms in the ER and normal hemoglobin patient was sent home after conservative management.  Last episode of bright red blood per rectum was 2 weeks ago.  Patient reports 1-2 soft to formed bowel movement a day.  No straining.  No weight loss, no melena, no nausea or vomiting.  No abdominal pain.  Patient has previous history of internal hemorrhoids and banding x3 last year in July, August and September 2020 by Dr. Marius Ditch.  Patient has previously had rectal bleeding from her hemorrhoids requiring hospitalization, last year.  Previous history:  Last EGD in March 2019 for follow-up of duodenal ulcer Findings: The examined esophagus was normal. The Z-line was irregular and was found 38 cm from the incisors. As per  recent guidelines, an irregular Z line does not need biopsies, unless  greater than 1 cm tongues of salmon colored tongues are seen, which is  not present here. Patchy mildly erythematous mucosa without bleeding was found in the  gastric antrum. Biopsies were taken with a cold forceps for histology.  Biopsies were obtained in the gastric body, at the incisura and in the  gastric antrum with cold forceps for histology. A mild post-ulcer deformity was found in the duodenal bulb. The  deformity was characterized by mild angulation at the area which was  able to be easily traversed with the endoscope. No other deformities  present. Biopsies were taken with a cold forceps for histology. There is no endoscopic evidence of ulceration in the  entire examined  duodenum. The duodenal bulb, second portion of the duodenum and examined duodenum  were normal othwerwise. Impression: - Normal esophagus. - Z-line irregular, 38 cm from the incisors. - Erythematous mucosa in the antrum. Biopsied. - Duodenal deformity (characterized by mild angulation  at healed ulcer site). Biopsied. - Normal duodenal bulb, second portion of the duodenum  and examined duodenum. - Biopsies were obtained in the gastric body, at the  incisura and in the gastric antrum.   Surgical Pathology  DIAGNOSIS:  A. 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  - NEGATIVE FOR INTRA-EPITHELIAL LYMPHOCYTOSIS, DYSPLASIA AND MALIGNANCY.   B. STOMACH, ANTRUM, BODY AND INCISURA; COLD BIOPSY:  - ANTRAL MUCOSA WITH MILD CHRONIC GASTRITIS AND FOCAL MILD ACTIVITY.  - OXYNTIC MUCOSA WITH MILD CHRONIC GASTRITIS.  - NEGATIVE FOR H. PYLORI, DYSPLASIA AND MALIGNANCY.        Previous history: initially seen on March 01, 2017 when she was admitted for hematochezia. She underwent EGD on March 02, 2017 that showed a 4 mm nonbleeding duodenal bulb ulcer that prevented scope advancement of the second portion. However, patient was not have any obstructive symptoms. Gastric mucosal atrophy was seen. Biopsies were positive for H. pylori and.patient was prescribed triple therapy.Colonoscopy done March 02, 2017 showed nonbleeding diverticulosis, internal hemorrhoids, extent of exam terminal ileum. Her hematochezia was thought to be due to the hemorrhoids as her hemoglobin did not drop far below her baseline, and was 11.8 at its lowest.  Patient's last colonoscopy was in April 2018, and it  was done  to follow-up for rectal bleeding just prior to the procedure. The colonoscopy was done by Dr. Vicente Males, and showed 2 small polyps that were removed, nonbleeding internal hemorrhoids, and diverticulosis. Pathology report showed tubular adenoma and hyperplastic polyp. She had a colonoscopy in August 2016 by Dr. Elmo Putt, diverticulosis was seen, and one small polyp was removed at that time. Polyp was tubular adenoma.  Current Outpatient Medications  Medication Sig Dispense Refill  . amLODipine (NORVASC) 2.5 MG tablet TAKE 1 TABLET DAILY 90 tablet 0  . Ascorbic Acid (VITAMIN C GUMMIE PO) Take by mouth.    . cefdinir (OMNICEF) 300 MG capsule Take 300 mg by mouth daily.    . cholecalciferol (VITAMIN D) 1000 units tablet Take 2 tablets (2,000 Units total) by mouth daily.    Marland Kitchen ELDERBERRY PO Take by mouth.    Marland Kitchen HYDROcodone-acetaminophen (NORCO) 7.5-325 MG tablet Take 1 tablet by mouth every 6 (six) hours as needed.    . NON FORMULARY Turneric and Ginger Tea    . oxybutynin (DITROPAN) 5 MG/5ML syrup Take 5 mg by mouth 2 (two) times daily.    . pantoprazole (PROTONIX) 40 MG tablet Take 1 tablet (40 mg total) by mouth daily. 90 tablet 1  . simvastatin (ZOCOR) 10 MG tablet Take 1 tablet (10 mg total) by mouth every other day. 90 tablet 1  . tamsulosin (FLOMAX) 0.4 MG CAPS capsule Take 0.4 mg by mouth daily.    . hydrocortisone (ANUSOL-HC) 25 MG suppository Place 1 suppository (25 mg total) rectally every 12 (twelve) hours for 7 days. 14 suppository 0   No current facility-administered medications for this visit.    Allergies as of 01/03/2020 - Review Complete 01/03/2020  Allergen Reaction Noted  . Ace inhibitors Swelling 06/13/2017  . Angiotensin receptor blockers  06/14/2017  . Apple Other (See Comments) 10/25/2014  . Aspirin Other (See Comments) 03/15/2017  . Bisphosphonates  08/18/2012  . Nsaids  03/15/2017  . Prolia [denosumab]  06/21/2013    ROS:  General: Negative for anorexia, weight loss,  fever, chills, fatigue, weakness. ENT: Negative for hoarseness, difficulty swallowing , nasal congestion. CV: Negative for chest pain, angina, palpitations, dyspnea on exertion, peripheral edema.  Respiratory: Negative for dyspnea at rest, dyspnea on exertion, cough, sputum, wheezing.  GI: See history of present illness. GU:  Negative for dysuria, hematuria, urinary incontinence, urinary frequency, nocturnal urination.  Endo: Negative for unusual weight change.    Physical Examination:   BP (!) 186/67   Pulse 87   Temp 98.5 F (36.9 C) (Oral)   Ht 4\' 9"  (1.448 m)   Wt 156 lb (70.8 kg)   BMI 33.76 kg/m   General: Well-nourished, well-developed in no acute distress.  Eyes: No icterus. Conjunctivae pink. Mouth: Oropharyngeal mucosa moist and pink , no lesions erythema or exudate. Neck: Supple, Trachea midline Abdomen: Bowel sounds are normal, nontender, nondistended, no hepatosplenomegaly or masses, no abdominal bruits or hernia , no rebound or guarding.   Rectal exam: Nonbleeding external hemorrhoids seen.  One large nonbleeding erythematous external hemorrhoid seen.  Yellow stool in rectal vault Extremities: No lower extremity edema. No clubbing or deformities. Neuro: Alert and oriented x 3.  Grossly intact. Skin: Warm and dry, no jaundice.   Psych: Alert and cooperative, normal mood and affect.   Labs: CMP     Component Value Date/Time   NA 141 12/12/2019 1615   K 3.5 12/12/2019 1615   CL 108 12/12/2019 1615   CO2 24 12/12/2019  1615   GLUCOSE 112 (H) 12/12/2019 1615   BUN 17 12/12/2019 1615   BUN 12 07/06/2011 0000   CREATININE 0.62 12/12/2019 1615   CREATININE 0.68 01/31/2014 1652   CALCIUM 8.9 12/12/2019 1615   PROT 6.7 12/12/2019 1615   ALBUMIN 3.7 12/12/2019 1615   AST 14 (L) 12/12/2019 1615   AST 12 07/06/2011 0000   ALT 8 12/12/2019 1615   ALKPHOS 86 12/12/2019 1615   BILITOT 0.5 12/12/2019 1615   GFRNONAA >60 12/12/2019 1615   GFRAA >60 03/04/2018 0438    Lab Results  Component Value Date   WBC 7.9 12/31/2019   HGB 12.9 12/31/2019   HCT 38.2 12/31/2019   MCV 87.4 12/31/2019   PLT 255.0 12/31/2019    Imaging Studies: CT Angio Abd/Pel W and/or Wo Contrast  Result Date: 12/12/2019 CLINICAL DATA:  GI bleeding EXAM: CTA ABDOMEN AND PELVIS WITHOUT AND WITH CONTRAST TECHNIQUE: Multidetector CT imaging of the abdomen and pelvis was performed using the standard protocol during bolus administration of intravenous contrast. Multiplanar reconstructed images and MIPs were obtained and reviewed to evaluate the vascular anatomy. CONTRAST:  170mL OMNIPAQUE IOHEXOL 350 MG/ML SOLN COMPARISON:  CT dated 10/03/2019 FINDINGS: VASCULAR Aorta: There is no abdominal aortic aneurysm. There are atherosclerotic changes of the abdominal aorta. Celiac: Patent without evidence of aneurysm, dissection, vasculitis or significant stenosis. SMA: Patent without evidence of aneurysm, dissection, vasculitis or significant stenosis. Renals: Both renal arteries are patent without evidence of aneurysm, dissection, vasculitis, fibromuscular dysplasia or significant stenosis. IMA: Patent without evidence of aneurysm, dissection, vasculitis or significant stenosis. Inflow: Patent without evidence of aneurysm, dissection, vasculitis or significant stenosis. Proximal Outflow: Bilateral common femoral and visualized portions of the superficial and profunda femoral arteries are patent without evidence of aneurysm, dissection, vasculitis or significant stenosis. Veins: No obvious venous abnormality within the limitations of this arterial phase study. Review of the MIP images confirms the above findings. NON-VASCULAR Lower chest: The lung bases are clear. The heart is enlarged. Hepatobiliary: There is a small hepatic hemangioma in hepatic segment 8. normal gallbladder.There is no biliary ductal dilation. Pancreas: Normal contours without ductal dilatation. No peripancreatic fluid collection.  Spleen: Unremarkable. Adrenals/Urinary Tract: --Adrenal glands: Unremarkable. --Right kidney/ureter: Again noted is a massive right renal cyst measuring approximately 12 by 11 cm. Multiple peripelvic cysts are noted on the right. There are multiple nonobstructing stones throughout the right kidney measuring up to approximately 1 cm in the lower pole. --Left kidney/ureter: Multiple left-sided peripelvic cysts are noted. --Urinary bladder: Unremarkable. Stomach/Bowel: --Stomach/Duodenum: No hiatal hernia or other gastric abnormality. Normal duodenal course and caliber. --Small bowel: Unremarkable. --Colon: Rectosigmoid diverticulosis without acute inflammation. --Appendix: Not visualized. No right lower quadrant inflammation or free fluid. Lymphatic: --No retroperitoneal lymphadenopathy. --No mesenteric lymphadenopathy. --No pelvic or inguinal lymphadenopathy. Reproductive: Status post hysterectomy. No adnexal mass. Other: No ascites or free air. The abdominal wall is normal. Musculoskeletal. 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 IMPRESSION: 1. No evidence for active GI bleeding. 2. Sigmoid diverticulosis without CT evidence for diverticulitis. 3. Nonobstructing right-sided nephrolithiasis. 4. Massive right renal cyst again noted. If this is symptomatic, percutaneous drainage can be considered. 5. Bilateral peripelvic cysts are noted. Aortic Atherosclerosis (ICD10-I70.0). Electronically Signed   By: Constance Holster M.D.   On: 12/12/2019 18:56    Assessment and Plan:   Patricia Hoover is a 74 y.o. y/o female with previous history of hospitalization in 2020 for bright blood  per rectum due to internal hemorrhoids, and subsequent outpatient banding in clinic with Dr. Marius Ditch x3 in 2020, with recurrent episode of bright red blood per rectum 3 to 4 weeks ago that has now resolved  With patient's external and internal hemorrhoids with previous history of  bleeding, and now recurrent bleeding from hemorrhoids, with recent colonoscopy was reassuring, patient would benefit from surgical referral for hemorrhoidectomy for definitive treatment  In the meantime, since surgical appointment may take some time, I will prescribe hydrocortisone suppository given her previous history of internal hemorrhoid and bleeding and above rectal exam  High-fiber diet encouraged  If symptoms recur prior to her next appointment patient vies to let us know and she verbalized understanding  Dr [name]

## 2020-01-03 NOTE — Patient Instructions (Signed)
We will send a referral to [facility] [facility] for your hemorrhoids. They will call you with an appointment for a consult.

## 2020-01-04 ENCOUNTER — Other Ambulatory Visit: Payer: Self-pay

## 2020-01-04 ENCOUNTER — Other Ambulatory Visit: Payer: Medicare Other

## 2020-01-04 ENCOUNTER — Ambulatory Visit (INDEPENDENT_AMBULATORY_CARE_PROVIDER_SITE_OTHER): Payer: Medicare Other

## 2020-01-04 DIAGNOSIS — Z0000 Encounter for general adult medical examination without abnormal findings: Secondary | ICD-10-CM | POA: Diagnosis not present

## 2020-01-04 NOTE — Progress Notes (Signed)
Subjective:   Patricia Hoover is a 74 y.o. female who presents for Medicare Annual (Subsequent) preventive examination.  Review of Systems: N/A      I connected with the patient today by telephone and verified that I am speaking with the correct person using two identifiers. Location patient: home Location nurse: work Persons participating in the telephone visit: patient, nurse.   I discussed the limitations, risks, security and privacy concerns of performing an evaluation and management service by telephone and the availability of in person appointments. I also discussed with the patient that there may be a patient responsible charge related to this service. The patient expressed understanding and verbally consented to this telephonic visit.        Cardiac Risk Factors include: advanced age (>72men, >32 women);hypertension     Objective:    Today's Vitals   01/04/20 0946  PainSc: 5    There is no height or weight on file to calculate BMI.  Advanced Directives 01/04/2020 12/12/2019 01/03/2019 03/04/2018 12/19/2017 06/13/2017 04/08/2017  Does Patient Have a Medical Advance Directive? Yes Yes Yes Yes Yes Yes Yes  Type of Paramedic of [place];Living will [place];Living will [place];Living will [place];Living will [place];Living will - [employer];Living will  Does patient want to make changes to medical advance directive? - - - No - Patient declined - - -  Copy of [place] in Chart? No - copy requested - No - copy requested No - copy requested No - copy requested - No - copy requested  Would patient like information on creating a medical advance directive? - - - - - - -    Current Medications (verified) Outpatient Encounter Medications as of 01/04/2020  Medication Sig  . amLODipine (NORVASC) 2.5 MG tablet TAKE 1 TABLET DAILY  . Ascorbic Acid  (VITAMIN C GUMMIE PO) Take by mouth.  . cefdinir (OMNICEF) 300 MG capsule Take 300 mg by mouth daily.  . cholecalciferol (VITAMIN D) 1000 units tablet Take 2 tablets (2,000 Units total) by mouth daily.  Marland Kitchen ELDERBERRY PO Take by mouth.  Marland Kitchen HYDROcodone-acetaminophen (NORCO) 7.5-325 MG tablet Take 1 tablet by mouth every 6 (six) hours as needed.  . hydrocortisone (ANUSOL-HC) 25 MG suppository Place 1 suppository (25 mg total) rectally every 12 (twelve) hours for 7 days.  . NON FORMULARY Turn[place] and Ginger Tea  . oxybutynin (DITROPAN) 5 MG/5ML syrup Take 5 mg by mouth 2 (two) times daily.  . pantoprazole (PROTONIX) 40 MG tablet Take 1 tablet (40 mg total) by mouth daily.  . simvastatin (ZOCOR) 10 MG tablet Take 1 tablet (10 mg total) by mouth every other day.  . tamsulosin (FLOMAX) 0.4 MG CAPS capsule Take 0.4 mg by mouth daily.   No facility-administered encounter medications on file as of 01/04/2020.    Allergies (verified) Ace inhibitors, Angiotensin receptor blockers, Apple, Aspirin, Bisphosphonates, Nsaids, and Prolia [denosumab]   History: Past Medical History:  Diagnosis Date  . Arrhythmia    possible hx, resolved prev  . Blood transfusion without reported diagnosis 1972   had reaction; had to stop  . Carpal tunnel syndrome   . Cataract 2019   resolved with surgery  . Duodenal ulcer   . H/O exercise stress test 2012   normal  . Heart murmur    as child  . High cholesterol   . History of kidney stones   . Hx  of colonic polyps   . Hypertension   . Osteoporosis 2010   t score - 3.9  . Renal stones    Past Surgical History:  Procedure Laterality Date  . ABDOMINAL HYSTERECTOMY  1972   for endometriosis  . CATARACT EXTRACTION W/ INTRAOCULAR LENS IMPLANT Right 06/2017  . CATARACT EXTRACTION W/ INTRAOCULAR LENS IMPLANT Left 07/2017  . COLONOSCOPY Left 03/02/2017   Procedure: COLONOSCOPY;  Surgeon: Virgel Manifold, MD;  Location: [facility] ENDOSCOPY;  Service: Endoscopy;   Laterality: Left;  . COLONOSCOPY WITH PROPOFOL N/A 05/04/2016   Procedure: COLONOSCOPY WITH PROPOFOL;  Surgeon: Jonathon Bellows, MD;  Location: [facility] ENDOSCOPY;  Service: Endoscopy;  Laterality: N/A;  . ESOPHAGOGASTRODUODENOSCOPY Left 03/02/2017   Procedure: ESOPHAGOGASTRODUODENOSCOPY (EGD);  Surgeon: Virgel Manifold, MD;  Location[facility]COPY;  Service: Endoscopy;  Laterality: Left;  . ESOPHAGOGASTRODUODENOSCOPY (EGD) WITH PROPOFOL N/A 04/08/2017   Procedure: ESOPHAGOGASTRODUODENOSCOPY (EGD) WITH PROPOFOL;  Surgeon: Virgel Manifold, MD;  Location: [facility] ENDOSCOPY;  Service: Endoscopy;  Laterality: N/A;  . LITHOTRIPSY     for kidney stone x5  . LITHOTRIPSY  09/2017   Family History  Problem Relation Age of Onset  . Heart disease Mother   . Renal Disease Mother   . Heart failure Mother   . Colon cancer Neg Hx   . Breast cancer Neg Hx    Social History   Socioeconomic History  . Marital status: Widowed    Spouse name: Not on file  . Number of children: Not on file  . Years of education: Not on file  . Highest education level: Not on file  Occupational History  . Occupation: Retired    Comment: Community education officer  Tobacco Use  . Smoking status: Former Smoker    Types: Cigarettes    Quit date: 01/19/1991    Years since quitting: 28.9  . Smokeless tobacco: Never Used  Vaping Use  . Vaping Use: Never used  Substance and Sexual Activity  . Alcohol use: No    Comment: rare wine  . Drug use: No  . Sexual activity: Not on file  Other Topics Concern  . Not on file  Social History Narrative   Education:  1 year Business School   Retired back to [employer] after working in [employer]   Widowed '93   2 kids   Social Determinants of Health   Financial Resource Strain: [place]   . Difficulty of Paying Living Expenses: Not hard at all  Food Insecurity: No Food Insecurity  . Worried About Charity fundraiser in the Last Year: Never true  . Ran Out of  Food in the Last Year: Never true  Transportation Needs: No Transportation Needs  . Lack of Transportation (Medical): No  . Lack of Transportation (Non-Medical): No  Physical Activity: Insufficiently Active  . Days of Exercise per Week: 2 days  . Minutes of Exercise per Session: 60 min  Stress: No Stress Concern Present  . Feeling of Stress : Not at all  Social Connections: Not on file    Tobacco Counseling Counseling given: Not Answered   Clinical Intake:  Pre-visit preparation completed: Yes  Pain : 0-10 Pain Score: 5  Pain Type: Chronic pain Pain Location: Back Pain Orientation: Lower Pain Descriptors / Indicators: Aching Pain Onset: More than a month ago Pain Frequency: Intermittent     Nutritional Risks: None Diabetes: No  How often do you need to have someone help you when you  read instructions, pamphlets, or other written materials from your doctor or pharmacy?: 1 - Never What is the last grade level you completed in school?: 1 year of college  Diabetic: no Nutrition Risk Assessment:  Has the patient had any N/V/D within the last 2 months?  No  Does the patient have any non-healing wounds?  No  Has the patient had any unintentional weight loss or weight gain?  No   Diabetes:  Is the patient diabetic?  No  If diabetic, was a CBG obtained today?  N/A Did the patient bring in their glucometer from home?  N/A How often do you monitor your CBG's? N/A.   Financial Strains and Diabetes Management:  Are you having any financial strains with the device, your supplies or your medication? N/A.  Does the patient want to be seen by Chronic Care Management for management of their diabetes?  N/A Would the patient like to be referred to a Nutritionist or for Diabetic Management?  N/A    Interpreter Needed?: No  Information entered by :: CJohnson, LPN   Activities of Daily Living In your present state of health, do you have any difficulty performing the  following activities: 01/04/2020  Hearing? Y  Comment some hearing changes in left ear  Vision? N  Difficulty concentrating or making decisions? N  Walking or climbing stairs? Y  Comment has back trouble  Dressing or bathing? N  Doing errands, shopping? N  Preparing Food and eating ? N  Using the Toilet? N  In the past six months, have you accidently leaked urine? Y  Comment wears pads daily  Do you have problems with loss of bowel control? N  Managing your Medications? N  Managing your Finances? N  Housekeeping or managing your Housekeeping? N  Some recent data might be hidden    Patient Care Team: Tonia Ghent, MD as PCP - General (Family Medicine) Bryson Ha, OD as Consulting Physician (Optometry)  Indicate any recent Medical Services you may have received from other than [facility] providers in the past year (date may be approximate).     Assessment:   This is a routine wellness examination for [place].  Hearing/Vision screen  Hearing Screening   125Hz  250Hz  500Hz  1000Hz  2000Hz  3000Hz  4000Hz  6000Hz  8000Hz   Right ear:           Left ear:           Vision Screening Comments: Patient gets annual eye exams   Dietary issues and exercise activities discussed: Current Exercise Habits: Structured exercise class, Type of exercise: Other - see comments (line dancing), Time (Minutes): 60, Frequency (Times/Week): 2, Weekly Exercise (Minutes/Week): 120, Intensity: Moderate, Exercise limited by: None identified  Goals    . Increase physical activity     Starting 12/13/2016, I will continue to do line dancing for 60 minutes 3 days and to do Silver Sneakers for 45 minutes 3 days per week.     . Patient Stated     01/03/2019, I will maintain and continue medications as prescribed.     . Patient Stated     01/04/2020, I will continue to line dance 2 days a week for 1 hour.       Depression Screen PHQ 2/9 Scores 12/31/2019 01/03/2019 12/19/2017 12/13/2016 12/10/2015 10/25/2014  06/04/2013  PHQ - 2 Score 0 0 0 0 0 0 0  PHQ- 9 Score - 0 0 0 - - -    Fall Risk Fall Risk  01/04/2020 12/31/2019  01/03/2019 12/19/2017 12/05/2017  Falls in the past year? 0 0 0 0 0  Comment - - - - Emmi Telephone Survey: data to providers prior to load  Number falls in past yr: 0 0 0 - -  Injury with Fall? 0 0 0 - -  Risk for fall due to : Medication side effect - Medication side effect - -  Follow up Falls evaluation completed;Falls prevention discussed Falls evaluation completed Falls evaluation completed;Falls prevention discussed - -    FALL RISK PREVENTION PERTAINING TO THE HOME:  Any stairs in or around the home? Yes  If so, are there any without handrails? No  Home free of loose throw rugs in walkways, pet beds, electrical cords, etc? Yes  Adequate lighting in your home to reduce risk of falls? Yes   ASSISTIVE DEVICES UTILIZED TO PREVENT FALLS:  Life alert? No  Use of a cane, walker or w/c? Yes  Grab bars in the bathroom? No  Shower chair or bench in shower? No  Elevated toilet seat or a handicapped toilet? No   TIMED UP AND GO:  Was the test performed? N/A, telephone visit .    Cognitive Function: MMSE - Mini Mental State Exam 01/04/2020 01/03/2019 12/19/2017 12/13/2016 12/10/2015  Orientation to time 5 5 5 5 5   Orientation to Place 5 5 5 5 5   Registration 3 3 3 3 3   Attention/ Calculation 5 5 0 0 0  Recall 3 3 3 3 3   Language- name 2 objects - - 0 0 0  Language- repeat 1 1 1 1 1   Language- follow 3 step command - - 3 3 3   Language- read & follow direction - - 0 0 0  Write a sentence - - 0 0 0  Copy design - - 0 0 0  Total score - - 20 20 20   Mini Cog  Mini-Cog screen was completed. Maximum score is 22. A value of 0 denotes this part of the MMSE was not completed or the patient failed this part of the Mini-Cog screening.       Immunizations Immunization History  Administered Date(s) Administered  . Influenza,inj,Quad PF,6+ Mos 10/25/2014, 12/10/2015,  12/13/2016  . Influenza-Unspecified 10/10/2013, 12/03/2017, 10/13/2018, 12/19/2019  . PFIZER SARS-COV-2 Vaccination 02/09/2019, 02/27/2019, 10/30/2019  . Pneumococcal Conjugate-13 10/25/2014  . Pneumococcal Polysaccharide-23 06/04/2013  . Tdap 07/06/2011  . Zoster 07/06/2011    TDAP status: Up to date  Flu Vaccine status: Up to date  Pneumococcal vaccine status: Up to date  Covid-19 vaccine status: Completed vaccines  Qualifies for Shingles Vaccine? Yes   Zostavax completed Yes   Shingrix Completed?: No.    Education has been provided regarding the importance of this vaccine. Patient has been advised to call insurance company to determine out of pocket expense if they have not yet received this vaccine. Advised may also receive vaccine at local pharmacy or Health Dept. Verbalized acceptance and understanding.  Screening Tests Health Maintenance  Topic Date Due  . COVID-19 Vaccine (4 - Booster for Pfizer series) 04/29/2020  . TETANUS/TDAP  07/05/2021  . COLONOSCOPY  03/02/2022  . INFLUENZA VACCINE  Completed  . DEXA SCAN  Completed  . Hepatitis C Screening  Completed  . PNA vac Low Risk Adult  Completed    Health Maintenance  There are no preventive care reminders to display for this patient.  Colorectal cancer screening: Type of screening: Colonoscopy. Completed 03/02/2017. Repeat every 5 years  Mammogram status: scheduled 01/08/2020  Bone Density  status: declined  Lung Cancer Screening: (Low Dose CT Chest recommended if Age 50-80 years, 30 pack-year currently smoking OR have quit w/in 15 years.) does not qualify.    Additional Screening:  Hepatitis C Screening: does qualify; Completed 10/29/2014  Vision Screening: Recommended annual ophthalmology exams for early detection of glaucoma and other disorders of the eye. Is the patient up to date with their annual eye exam?  Yes  Who is the provider or what is the name of the office in which the patient attends annual  eye exams? [facility]  If pt is not established with a provider, would they like to be referred to a provider to establish care? No .   Dental Screening: Recommended annual dental exams for proper oral hygiene  Community Resource Referral / Chronic Care Management: CRR required this visit?  No   CCM required this visit?  No      Plan:     I have personally reviewed and noted the following in the patient's chart:   . Medical and social history . Use of alcohol, tobacco or illicit drugs  . Current medications and supplements . Functional ability and status . Nutritional status . Physical activity . Advanced directives . List of other physicians . Hospitalizations, surgeries, and ER visits in previous 12 months . Vitals . Screenings to include cognitive, depression, and falls . Referrals and appointments  In addition, I have reviewed and discussed with patient certain preventive protocols, quality metrics, and best practice recommendations. A written personalized care plan for preventive services as well as general preventive health recommendations were provided to patient.   Due to this being a telephonic visit, the after visit summary with patients personalized plan was offered to patient via office or my-chart.  Patient preferred to pick up at office at next visit or via mychart.   Andrez Grime, LPN   [id number]

## 2020-01-04 NOTE — Patient Instructions (Signed)
[name] , Thank you for taking time to come for your Medicare Wellness Visit. I appreciate your ongoing commitment to your health goals. Please review the following plan we discussed and let me know if I can assist you in the future.   Screening recommendations/referrals: Colonoscopy: Up to date, completed 03/02/2017, due 02/2022 Mammogram: scheduled 01/08/2020 Bone Density: declined Recommended yearly ophthalmology/optometry visit for glaucoma screening and checkup Recommended yearly dental visit for hygiene and checkup  Vaccinations: Influenza vaccine: Up to date, completed 12/19/2019, due 08/2020 Pneumococcal vaccine: Completed series Tdap vaccine: Up to date, completed 07/06/2011, due 06/2021 Shingles vaccine: due, check with your insurance regarding coverage if interested    Covid-19:Completed series  Advanced directives: Please bring a copy of your POA (Power of [place]) and/or Living Will to your next appointment.   Conditions/risks identified: hypertension  Next appointment: Follow up in one year for your annual wellness visit    Preventive Care 26 Years and Older, Female Preventive care refers to lifestyle choices and visits with your health care provider that can promote health and wellness. What does preventive care include?  A yearly physical exam. This is also called an annual well check.  Dental exams once or twice a year.  Routine eye exams. Ask your health care provider how often you should have your eyes checked.  Personal lifestyle choices, including:  Daily care of your teeth and gums.  Regular physical activity.  Eating a healthy diet.  Avoiding tobacco and drug use.  Limiting alcohol use.  Practicing safe sex.  Taking low-dose aspirin every day.  Taking vitamin and mineral supplements as recommended by your health care provider. What happens during an annual well check? The services and screenings done by your health care provider during your  annual well check will depend on your age, overall health, lifestyle risk factors, and family history of disease. Counseling  Your health care provider may ask you questions about your:  Alcohol use.  Tobacco use.  Drug use.  Emotional well-being.  Home and relationship well-being.  Sexual activity.  Eating habits.  History of falls.  Memory and ability to understand (cognition).  Work and work Statistician.  Reproductive health. Screening  You may have the following tests or measurements:  Height, weight, and BMI.  Blood pressure.  Lipid and cholesterol levels. These may be checked every 5 years, or more frequently if you are over 58 years old.  Skin check.  Lung cancer screening. You may have this screening every year starting at age 43 if you have a 30-pack-year history of smoking and currently smoke or have quit within the past 15 years.  Fecal occult blood test (FOBT) of the stool. You may have this test every year starting at age 42.  Flexible sigmoidoscopy or colonoscopy. You may have a sigmoidoscopy every 5 years or a colonoscopy every 10 years starting at age 35.  Hepatitis C blood test.  Hepatitis B blood test.  Sexually transmitted disease (STD) testing.  Diabetes screening. This is done by checking your blood sugar (glucose) after you have not eaten for a while (fasting). You may have this done every 1-3 years.  Bone density scan. This is done to screen for osteoporosis. You may have this done starting at age 35.  Mammogram. This may be done every 1-2 years. Talk to your health care provider about how often you should have regular mammograms. Talk with your health care provider about your test results, treatment options, and if necessary, the need  for more tests. Vaccines  Your health care provider may recommend certain vaccines, such as:  Influenza vaccine. This is recommended every year.  Tetanus, diphtheria, and acellular pertussis (Tdap, Td)  vaccine. You may need a Td booster every 10 years.  Zoster vaccine. You may need this after age 62.  Pneumococcal 13-valent conjugate (PCV13) vaccine. One dose is recommended after age 32.  Pneumococcal polysaccharide (PPSV23) vaccine. One dose is recommended after age 67. Talk to your health care provider about which screenings and vaccines you need and how often you need them. This information is not intended to replace advice given to you by your health care provider. Make sure you discuss any questions you have with your health care provider. Document Released: 01/31/2015 Document Revised: 09/24/2015 Document Reviewed: 11/05/2014 Elsevier Interactive Patient Education  2017 [place] Prevention in the Home Falls can cause injuries. They can happen to people of all ages. There are many things you can do to make your home safe and to help prevent falls. What can I do on the outside of my home?  Regularly fix the edges of walkways and driveways and fix any cracks.  Remove anything that might make you trip as you walk through a door, such as a raised step or threshold.  Trim any bushes or trees on the path to your home.  Use bright outdoor lighting.  Clear any walking paths of anything that might make someone trip, such as rocks or tools.  Regularly check to see if handrails are loose or broken. Make sure that both sides of any steps have handrails.  Any raised decks and porches should have guardrails on the edges.  Have any leaves, snow, or ice cleared regularly.  Use sand or salt on walking paths during winter.  Clean up any spills in your garage right away. This includes oil or grease spills. What can I do in the bathroom?  Use night lights.  Install grab bars by the toilet and in the tub and shower. Do not use towel bars as grab bars.  Use non-skid mats or decals in the tub or shower.  If you need to sit down in the shower, use a plastic, non-slip  stool.  Keep the floor dry. Clean up any water that spills on the floor as soon as it happens.  Remove soap buildup in the tub or shower regularly.  Attach bath mats securely with double-sided non-slip rug tape.  Do not have throw rugs and other things on the floor that can make you trip. What can I do in the bedroom?  Use night lights.  Make sure that you have a light by your bed that is easy to reach.  Do not use any sheets or blankets that are too big for your bed. They should not hang down onto the floor.  Have a firm chair that has side arms. You can use this for support while you get dressed.  Do not have throw rugs and other things on the floor that can make you trip. What can I do in the kitchen?  Clean up any spills right away.  Avoid walking on wet floors.  Keep items that you use a lot in easy-to-reach places.  If you need to reach something above you, use a strong step stool that has a grab bar.  Keep electrical cords out of the way.  Do not use floor polish or wax that makes floors slippery. If you must use wax, use  non-skid floor wax.  Do not have throw rugs and other things on the floor that can make you trip. What can I do with my stairs?  Do not leave any items on the stairs.  Make sure that there are handrails on both sides of the stairs and use them. Fix handrails that are broken or loose. Make sure that handrails are as long as the stairways.  Check any carpeting to make sure that it is firmly attached to the stairs. Fix any carpet that is loose or worn.  Avoid having throw rugs at the top or bottom of the stairs. If you do have throw rugs, attach them to the floor with carpet tape.  Make sure that you have a light switch at the top of the stairs and the bottom of the stairs. If you do not have them, ask someone to add them for you. What else can I do to help prevent falls?  Wear shoes that:  Do not have high heels.  Have rubber bottoms.  Are  comfortable and fit you well.  Are closed at the toe. Do not wear sandals.  If you use a stepladder:  Make sure that it is fully opened. Do not climb a closed stepladder.  Make sure that both sides of the stepladder are locked into place.  Ask someone to hold it for you, if possible.  Clearly mark and make sure that you can see:  Any grab bars or handrails.  First and last steps.  Where the edge of each step is.  Use tools that help you move around (mobility aids) if they are needed. These include:  Canes.  Walkers.  Scooters.  Crutches.  Turn on the lights when you go into a dark area. Replace any light bulbs as soon as they burn out.  Set up your furniture so you have a clear path. Avoid moving your furniture around.  If any of your floors are uneven, fix them.  If there are any pets around you, be aware of where they are.  Review your medicines with your doctor. Some medicines can make you feel dizzy. This can increase your chance of falling. Ask your doctor what other things that you can do to help prevent falls. This information is not intended to replace advice given to you by your health care provider. Make sure you discuss any questions you have with your health care provider. Document Released: 10/31/2008 Document Revised: 06/12/2015 Document Reviewed: 02/08/2014 Elsevier Interactive Patient Education  2017 [employer].

## 2020-01-04 NOTE — Progress Notes (Signed)
PCP notes:  Health Maintenance: Dexa- declined   Abnormal Screenings: none   Patient concerns: none   Nurse concerns: none  Next PCP appt.: 01/15/2020 @ 10 am

## 2020-01-07 MED ORDER — HYDROCORTISONE 1 % EX OINT: 1.0000 "application " | TOPICAL_OINTMENT | Freq: Two times a day (BID) | CUTANEOUS | 0 refills | Status: DC

## 2020-01-07 NOTE — Telephone Encounter (Signed)
Virgel Manifold, MD  You 16 minutes ago (10:58 AM)    See if pharmacy can change it to hydrocortisone ointment and if it would be cheaper for her      Called patient but was not able to speak with her. I will send her a MyChart message letting her know what Dr. Bonna Gains is recommending for her to buy.

## 2020-01-08 ENCOUNTER — Other Ambulatory Visit: Payer: Self-pay

## 2020-01-08 ENCOUNTER — Ambulatory Visit
Admission: RE | Admit: 2020-01-08 | Discharge: 2020-01-08 | Disposition: A | Discharge status: Home / Self Care | Payer: Medicare Other | Source: Ambulatory Visit | Attending: Family Medicine | Admitting: Family Medicine

## 2020-01-08 DIAGNOSIS — Z1231 Encounter for screening mammogram for malignant neoplasm of breast: Secondary | ICD-10-CM

## 2020-01-14 ENCOUNTER — Encounter: Payer: Medicare Other | Admitting: Family Medicine

## 2020-01-15 ENCOUNTER — Encounter: Payer: Medicare Other | Admitting: Family Medicine

## 2020-02-08 ENCOUNTER — Telehealth: Payer: Self-pay

## 2020-02-08 ENCOUNTER — Other Ambulatory Visit: Payer: Self-pay | Admitting: Orthopedic Surgery

## 2020-02-08 DIAGNOSIS — M545 Low back pain, unspecified: Secondary | ICD-10-CM

## 2020-02-08 DIAGNOSIS — M79661 Pain in right lower leg: Secondary | ICD-10-CM | POA: Diagnosis not present

## 2020-02-08 NOTE — Telephone Encounter (Signed)
Contacted pt who reports she has contacted Dr. Philis Nettle, back specialist to request them to send in an order for a rolling walker. They wanted her to come in for an apt so she could be assessed. Pt has apt with them today at 1315. She will f/u with this office with the results of that apt. Pt is keeping apt on Tues with PCP at this time. Advised if anything is needed contact office. Pt verbalized understanding.

## 2020-02-08 NOTE — Telephone Encounter (Signed)
[facility] - Client TELEPHONE ADVICE RECORD [facility] Patient Name: Patricia Hoover Gender: Female DOB: 1945/09/29 Age: 75 Y 3 M 27 D Return Phone Number: [phone] (Primary), [phone] (Secondary) Address: [address] City/State/Zip: [place] [address] Client [facility] - Client Client Site [facility] - [facility] Physician Renford Dills - MD Contact Type Call Who Is Calling Patient / Member / Family / Caregiver Call Type Triage / Clinical Relationship To Patient Self Return Phone Number [phone] (Primary) Chief Complaint Walking difficulty Reason for Call Symptomatic / Request for [place] states Melanie calling from Dr. Josefine Class office. Pt is having issues walking and having severe leg pain. . Pt has arthritis in her back and osteoporosis and all the back specialist could do was to give her shot. Pt is wanting a rolling walker with a seat. Pt does have an appt. on Tuesday. [facility] ER Translation No Nurse Assessment Nurse: Alveta Heimlich, RN, Rise Paganini Date/Time Eilene Ghazi Time): 02/08/2020 10:50:37 AM Confirm and document reason for call. If symptomatic, describe symptoms. ---Caller states she has pain in legs, pain around her calf and went to a specialist. The specialist told her it was coming from her back. The specialist wanted to give her a shot , but she did not choose to take it right then. She is not able to walk right now. this started about a year ago and comes and goes. Started with a flare yesterday. She started with a cane but could not walk at all when she got home from shopping. She wants to know a place to go to get her a walker . she is not having severe pain. She is unable to walk on her legs, it hurts. she is not sure she can get a dr's order today. Does the patient have any new or worsening symptoms? ---Yes Will a  triage be completed? ---Yes Related visit to physician within the last 2 weeks? ---Yes Does the PT have any chronic conditions? (i.e. diabetes, asthma, this includes High risk factors for pregnancy, etc.) ---Yes List chronic conditions. ---osteoporosis, arthritis in back. HTN Is this a behavioral health or substance abuse call? ---No Guidelines Guideline Title Affirmed Question Affirmed Notes Nurse Date/Time Eilene Ghazi Time) Leg Pain Unable to walk Alveta Heimlich, RN, Rise Paganini 02/08/2020 10:58:27 AM PLEASE NOTE: All timestamps contained within this report are represented as [place] Standard Time. CONFIDENTIALTY NOTICE: This fax transmission is intended only for the addressee. It contains information that is legally privileged, confidential or otherwise protected from use or disclosure. If you are not the intended recipient, you are strictly prohibited from reviewing, disclosing, copying using or disseminating any of this information or taking any action in reliance on or regarding this information. If you have received this fax in error, please notify us immediately by telephone so that we can arrange for its return to [place]. Phone: [phone], Toll-Free: [phone], Fax: [phone] Page: 2 of 2 Call Id: [id number] [place]. Time Eilene Ghazi Time) Disposition Final User 02/08/2020 11:01:30 AM Go to ED Now Yes Alveta Heimlich, RN, Ali Lowe Disagree/Comply Comply Caller Understands Yes PreDisposition Call Doctor Care Advice Given Per Guideline GO TO ED NOW: CARE ADVICE given per Leg Pain (Adult) guideline. Comments User: Debby Bud, RN Date/Time Eilene Ghazi Time): 02/08/2020 11:02:59 AM Having swelling in the right leg from the foot up that started yesterday. Referrals GO TO FACILITY OTHER - SPECIFY

## 2020-02-08 NOTE — Telephone Encounter (Signed)
Pt called in complaning of back pain also having difficulty walking... pt requests a Rx be sent in for a walker and she did not know if she could get a steroid inj... pt was referred to ortho 12/2018... pt states she did not want to go back to the specialist and just wants an order for a walker... I advised pt that if she is having difficulty walking or can not walk that she needs to be evaluated... I sent pt to access nurse for more info as she would just keep telling me that she just could not walk... I have gone ahead and scheduled pt on Tues 1/27 at 12 pm in the meantime

## 2020-02-08 NOTE — Telephone Encounter (Signed)
I agree with getting her triaged and evaluated.  Thanks.  I will await the triage note.

## 2020-02-12 ENCOUNTER — Ambulatory Visit: Payer: Medicare Other | Admitting: Family Medicine

## 2020-02-12 NOTE — Telephone Encounter (Signed)
[facility] - Client Nonclinical Telephone Record [facility] Client [facility] - Client Client Site [facility] Physician Renford Dills - MD Contact Type Call Who Is Calling Patient / Member / Family / Caregiver Caller Name [place] Phone Number [phone] Patient Name Patricia Hoover Patient DOB 01-14-1946 Call Type Message Only Information Provided Reason for Call Request to [facility] Appointment Initial Comment Caller states she needs to cancel an appt for tomorrow. Additional Comment The appt is at 1200 PM. Disp. Time Disposition Final User 02/11/2020 6:26:01 PM General Information Provided Yes Nolon Bussing Call Closed By: Nolon Bussing Transaction Date/Time: 02/11/2020 6:23:56 PM (ET)

## 2020-02-22 ENCOUNTER — Other Ambulatory Visit: Payer: Self-pay

## 2020-02-22 ENCOUNTER — Encounter: Payer: Self-pay | Admitting: Family Medicine

## 2020-02-22 ENCOUNTER — Ambulatory Visit (INDEPENDENT_AMBULATORY_CARE_PROVIDER_SITE_OTHER): Payer: Medicare Other | Admitting: Family Medicine

## 2020-02-22 DIAGNOSIS — M79604 Pain in right leg: Secondary | ICD-10-CM

## 2020-02-22 MED ORDER — CELECOXIB 100 MG PO CAPS: 100.0000 mg | ORAL_CAPSULE | Freq: Every day | ORAL | Status: DC | PRN

## 2020-02-22 NOTE — Progress Notes (Signed)
This visit occurred during the SARS-CoV-2 public health emergency.  Safety protocols were in place, including screening questions prior to the visit, additional usage of staff PPE, and extensive cleaning of exam room while observing appropriate contact time as indicated for disinfecting solutions.  No pain after prev injection per orthopedic clinic.  She has some occ pulling in the R popliteal area, more with standing, less with sitting.  We talked about her previous orthopedic eval.  I have reviewed the consult note.  My prev concern wasn't about DVT but other ortho ddx.  Discussed.  She didn't take prednisone.  Per patient, insurance wouldn't cover the prednisone.  We talked about potential side effects.  She is clearly better in the meantime, even without the prednisone.  It would have been reasonable at the time to use prednisone, dw pt.  Fortunately she is better in the meantime.    We talked about celebrex.  She hasn't used it yet but is better in the meantime.    She has MRI pending.  No FCNAVD.  No leg weakness.   H/o osteoporosis.  She failed tx with prev meds.  Discussed.  Meds, vitals, and allergies reviewed.   ROS: Per HPI unless specifically indicated in ROS section   nad ncat Neck supple, no LA rrr ctab Abdomen soft.  Not tender. S/S grossly wnl BLE. SLR neg B.   Able to bear weight.

## 2020-02-22 NOTE — Patient Instructions (Addendum)
Okay to take tylenol then celebrex if needed for pain. If you don't need it, then don't take it.  I'll await the follow up report from orthopedics after your MRI.   Take care.  Glad to see you.

## 2020-02-24 ENCOUNTER — Other Ambulatory Visit: Payer: Self-pay

## 2020-02-24 ENCOUNTER — Ambulatory Visit
Admission: RE | Admit: 2020-02-24 | Discharge: 2020-02-24 | Disposition: A | Discharge status: Home / Self Care | Payer: Medicare Other | Source: Ambulatory Visit | Attending: Orthopedic Surgery | Admitting: Orthopedic Surgery

## 2020-02-24 DIAGNOSIS — M48061 Spinal stenosis, lumbar region without neurogenic claudication: Secondary | ICD-10-CM | POA: Diagnosis not present

## 2020-02-24 DIAGNOSIS — M545 Low back pain, unspecified: Secondary | ICD-10-CM | POA: Diagnosis not present

## 2020-02-24 NOTE — Assessment & Plan Note (Signed)
Likely from her back, significantly improved after previous injection.  It would have been reasonable to try a short course of prednisone but she improved without needing that in the meantime.  Discussed options Okay to take tylenol then celebrex if needed for pain. If she does not need it, then don't take it.  I'll await the follow up report from orthopedics after her MRI.  She agrees with plan.

## 2020-02-29 DIAGNOSIS — M545 Low back pain, unspecified: Secondary | ICD-10-CM | POA: Diagnosis not present

## 2020-03-05 DIAGNOSIS — K644 Residual hemorrhoidal skin tags: Secondary | ICD-10-CM | POA: Diagnosis not present

## 2020-03-05 DIAGNOSIS — K648 Other hemorrhoids: Secondary | ICD-10-CM | POA: Diagnosis not present

## 2020-03-19 DIAGNOSIS — K644 Residual hemorrhoidal skin tags: Secondary | ICD-10-CM | POA: Diagnosis not present

## 2020-03-19 DIAGNOSIS — K648 Other hemorrhoids: Secondary | ICD-10-CM | POA: Diagnosis not present

## 2020-05-06 DIAGNOSIS — Z23 Encounter for immunization: Secondary | ICD-10-CM | POA: Diagnosis not present

## 2020-05-12 ENCOUNTER — Other Ambulatory Visit: Payer: Self-pay | Admitting: Family Medicine

## 2020-07-03 DIAGNOSIS — N281 Cyst of kidney, acquired: Secondary | ICD-10-CM | POA: Diagnosis not present

## 2020-07-03 DIAGNOSIS — N200 Calculus of kidney: Secondary | ICD-10-CM | POA: Diagnosis not present

## 2020-07-03 DIAGNOSIS — I700 Atherosclerosis of aorta: Secondary | ICD-10-CM | POA: Diagnosis not present

## 2020-07-03 DIAGNOSIS — K5750 Diverticulosis of both small and large intestine without perforation or abscess without bleeding: Secondary | ICD-10-CM | POA: Diagnosis not present

## 2020-07-30 ENCOUNTER — Ambulatory Visit: Payer: Medicare Other | Attending: Critical Care Medicine

## 2020-07-30 DIAGNOSIS — Z20822 Contact with and (suspected) exposure to covid-19: Secondary | ICD-10-CM | POA: Diagnosis not present

## 2020-07-31 LAB — SARS-COV-2, NAA 2 DAY TAT

## 2020-07-31 LAB — NOVEL CORONAVIRUS, NAA: SARS-CoV-2, NAA: NOT DETECTED

## 2020-08-05 DIAGNOSIS — N200 Calculus of kidney: Secondary | ICD-10-CM | POA: Diagnosis not present

## 2020-08-10 ENCOUNTER — Telehealth: Payer: Self-pay

## 2020-08-10 NOTE — Telephone Encounter (Signed)
Pt called to see if she needs another booster. Pt has had full series , including 2 boosters. Updated immunization record in [place]. Pt advised she is has completed the current vaccination recommendations.Pt verbalized understanding.

## 2020-08-20 DIAGNOSIS — N200 Calculus of kidney: Secondary | ICD-10-CM | POA: Diagnosis not present

## 2020-09-02 DIAGNOSIS — N200 Calculus of kidney: Secondary | ICD-10-CM | POA: Diagnosis not present

## 2020-09-15 DIAGNOSIS — N200 Calculus of kidney: Secondary | ICD-10-CM | POA: Diagnosis not present

## 2020-09-17 ENCOUNTER — Emergency Department (HOSPITAL_BASED_OUTPATIENT_CLINIC_OR_DEPARTMENT_OTHER)
Admission: EM | Admit: 2020-09-17 | Discharge: 2020-09-18 | Disposition: A | Discharge status: Home / Self Care | Payer: Medicare Other | Attending: Emergency Medicine | Admitting: Emergency Medicine

## 2020-09-17 ENCOUNTER — Other Ambulatory Visit: Payer: Self-pay

## 2020-09-17 ENCOUNTER — Encounter (HOSPITAL_BASED_OUTPATIENT_CLINIC_OR_DEPARTMENT_OTHER): Payer: Self-pay | Admitting: *Deleted

## 2020-09-17 DIAGNOSIS — I10 Essential (primary) hypertension: Secondary | ICD-10-CM | POA: Insufficient documentation

## 2020-09-17 DIAGNOSIS — N132 Hydronephrosis with renal and ureteral calculous obstruction: Secondary | ICD-10-CM | POA: Insufficient documentation

## 2020-09-17 DIAGNOSIS — R109 Unspecified abdominal pain: Secondary | ICD-10-CM | POA: Diagnosis present

## 2020-09-17 DIAGNOSIS — Z79899 Other long term (current) drug therapy: Secondary | ICD-10-CM | POA: Diagnosis not present

## 2020-09-17 DIAGNOSIS — K5730 Diverticulosis of large intestine without perforation or abscess without bleeding: Secondary | ICD-10-CM | POA: Diagnosis not present

## 2020-09-17 DIAGNOSIS — Z87891 Personal history of nicotine dependence: Secondary | ICD-10-CM | POA: Diagnosis not present

## 2020-09-17 DIAGNOSIS — M47816 Spondylosis without myelopathy or radiculopathy, lumbar region: Secondary | ICD-10-CM | POA: Diagnosis not present

## 2020-09-17 DIAGNOSIS — N281 Cyst of kidney, acquired: Secondary | ICD-10-CM | POA: Diagnosis not present

## 2020-09-17 DIAGNOSIS — N200 Calculus of kidney: Secondary | ICD-10-CM

## 2020-09-17 NOTE — ED Triage Notes (Signed)
C/o right flank pain x 1 day , n/v/ hx stones

## 2020-09-18 ENCOUNTER — Emergency Department (HOSPITAL_BASED_OUTPATIENT_CLINIC_OR_DEPARTMENT_OTHER): Payer: Medicare Other

## 2020-09-18 ENCOUNTER — Encounter: Payer: Self-pay | Admitting: Family Medicine

## 2020-09-18 ENCOUNTER — Ambulatory Visit (INDEPENDENT_AMBULATORY_CARE_PROVIDER_SITE_OTHER): Payer: Medicare Other | Admitting: Family Medicine

## 2020-09-18 VITALS — BP 110/72 | HR 72 | Temp 97.9°F | Ht <= 58 in | Wt 158.0 lb

## 2020-09-18 DIAGNOSIS — N281 Cyst of kidney, acquired: Secondary | ICD-10-CM | POA: Diagnosis not present

## 2020-09-18 DIAGNOSIS — R739 Hyperglycemia, unspecified: Secondary | ICD-10-CM

## 2020-09-18 DIAGNOSIS — M47816 Spondylosis without myelopathy or radiculopathy, lumbar region: Secondary | ICD-10-CM | POA: Diagnosis not present

## 2020-09-18 DIAGNOSIS — N132 Hydronephrosis with renal and ureteral calculous obstruction: Secondary | ICD-10-CM | POA: Diagnosis not present

## 2020-09-18 DIAGNOSIS — N200 Calculus of kidney: Secondary | ICD-10-CM | POA: Diagnosis not present

## 2020-09-18 DIAGNOSIS — K5730 Diverticulosis of large intestine without perforation or abscess without bleeding: Secondary | ICD-10-CM | POA: Diagnosis not present

## 2020-09-18 LAB — CBC WITH DIFFERENTIAL/PLATELET
Abs Immature Granulocytes: 0.07 10*3/uL (ref 0.00–0.07)
Basophils Absolute: 0.1 10*3/uL (ref 0.0–0.1)
Basophils Relative: 0 %
Eosinophils Absolute: 0 10*3/uL (ref 0.0–0.5)
Eosinophils Relative: 0 %
HCT: 43.2 % (ref 36.0–46.0)
Hemoglobin: 14.8 g/dL (ref 12.0–15.0)
Immature Granulocytes: 0 %
Lymphocytes Relative: 9 %
Lymphs Abs: 1.5 10*3/uL (ref 0.7–4.0)
MCH: 29.5 pg (ref 26.0–34.0)
MCHC: 34.3 g/dL (ref 30.0–36.0)
MCV: 86.2 fL (ref 80.0–100.0)
Monocytes Absolute: 0.5 10*3/uL (ref 0.1–1.0)
Monocytes Relative: 3 %
Neutro Abs: 14.1 10*3/uL — ABNORMAL HIGH (ref 1.7–7.7)
Neutrophils Relative %: 88 %
Platelets: 310 10*3/uL (ref 150–400)
RBC: 5.01 MIL/uL (ref 3.87–5.11)
RDW: 13.2 % (ref 11.5–15.5)
WBC: 16.1 10*3/uL — ABNORMAL HIGH (ref 4.0–10.5)
nRBC: 0 % (ref 0.0–0.2)

## 2020-09-18 LAB — URINALYSIS, ROUTINE W REFLEX MICROSCOPIC
Bilirubin Urine: NEGATIVE
Glucose, UA: NEGATIVE mg/dL
Ketones, ur: 15 mg/dL — AB
Nitrite: NEGATIVE
Protein, ur: NEGATIVE mg/dL
Specific Gravity, Urine: 1.015 (ref 1.005–1.030)
pH: 7 (ref 5.0–8.0)

## 2020-09-18 LAB — COMPREHENSIVE METABOLIC PANEL
ALT: 11 U/L (ref 0–44)
AST: 16 U/L (ref 15–41)
Albumin: 4.3 g/dL (ref 3.5–5.0)
Alkaline Phosphatase: 91 U/L (ref 38–126)
Anion gap: 11 (ref 5–15)
BUN: 13 mg/dL (ref 8–23)
CO2: 22 mmol/L (ref 22–32)
Calcium: 8.7 mg/dL — ABNORMAL LOW (ref 8.9–10.3)
Chloride: 105 mmol/L (ref 98–111)
Creatinine, Ser: 0.68 mg/dL (ref 0.44–1.00)
GFR, Estimated: >60 mL/min (ref >60)
Glucose, Bld: 193 mg/dL — ABNORMAL HIGH (ref 70–99)
Potassium: 3.8 mmol/L (ref 3.5–5.1)
Sodium: 138 mmol/L (ref 135–145)
Total Bilirubin: 0.4 mg/dL (ref 0.3–1.2)
Total Protein: 7.6 g/dL (ref 6.5–8.1)

## 2020-09-18 LAB — URINALYSIS, MICROSCOPIC (REFLEX)

## 2020-09-18 LAB — POCT GLYCOSYLATED HEMOGLOBIN (HGB A1C): Hemoglobin A1C: 6.2 % — AB (ref 4.0–5.6)

## 2020-09-18 MED ORDER — OXYCODONE-ACETAMINOPHEN 5-325 MG PO TABS: 2.0000 | ORAL_TABLET | ORAL | 0 refills | Status: DC | PRN

## 2020-09-18 MED ORDER — ONDANSETRON HCL 4 MG/2ML IJ SOLN
4.0000 mg | Freq: Once | INTRAMUSCULAR | Status: AC
  Administered 2020-09-18: 4 mg via INTRAVENOUS
  Filled 2020-09-18: qty 2

## 2020-09-18 MED ORDER — ONDANSETRON 4 MG PO TBDP: ORAL_TABLET | ORAL | 0 refills | Status: DC

## 2020-09-18 MED ORDER — OXYCODONE-ACETAMINOPHEN 5-325 MG PO TABS
1.0000 | ORAL_TABLET | Freq: Once | ORAL | Status: AC
  Administered 2020-09-18: 1 via ORAL
  Filled 2020-09-18: qty 1

## 2020-09-18 MED ORDER — FENTANYL CITRATE (PF) 100 MCG/2ML IJ SOLN
50.0000 ug | Freq: Once | INTRAMUSCULAR | Status: AC
Start: 2020-09-18 — End: 2020-09-18
  Administered 2020-09-18: 50 ug via INTRAVENOUS
  Filled 2020-09-18: qty 2

## 2020-09-18 MED ORDER — LACTATED RINGERS IV BOLUS
1000.0000 mL | Freq: Once | INTRAVENOUS | Status: AC
  Administered 2020-09-18: 1000 mL via INTRAVENOUS

## 2020-09-18 NOTE — ED Provider Notes (Signed)
[facility] EMERGENCY DEPARTMENT Provider Note   CSN: [id number] Arrival date & time: 09/17/20  2348     History Chief Complaint  Patient presents with   Flank Pain    Patricia Hoover is a 75 y.o. female.   Flank Pain This is a recurrent problem. The current episode started 6 to 12 hours ago. The problem occurs constantly. The problem has been gradually worsening. Associated symptoms include abdominal pain. Pertinent negatives include no chest pain, no headaches and no shortness of breath. Nothing aggravates the symptoms. Nothing relieves the symptoms. She has tried nothing for the symptoms. The treatment provided no relief.      Past Medical History:  Diagnosis Date   Arrhythmia    possible hx, resolved prev   Blood transfusion without reported diagnosis 1972   had reaction; had to stop   Carpal tunnel syndrome    Cataract 2019   resolved with surgery   Duodenal ulcer    H/O exercise stress test 2012   normal   Heart murmur    as child   High cholesterol    History of kidney stones    Hx of colonic polyps    Hypertension    Osteoporosis 2010   t score - 3.9   Renal stones     Patient Active Problem List   Diagnosis Date Noted   Posterior knee pain, right 05/21/2019   Right leg pain 05/21/2019   Eustachian tube dysfunction 05/02/2019   Healthcare maintenance 01/08/2018   Paresthesia 01/08/2018   Neck pain 08/31/2017   Angioedema 06/15/2017   Peptic ulcer of duodenum    Other specified diseases of esophagus    Hematochezia    External hemorrhoids    Diverticulosis of large intestine without diverticulitis    Internal hemorrhoids    Duodenal ulceration    Stomach irritation    Abnormal stools 02/27/2017   Advance care planning 12/23/2016   Renal stone 04/20/2016   Heartburn 12/17/2015   Medicare annual wellness visit, subsequent 06/05/2013   Hyperglycemia 06/05/2013   Knee pain 08/31/2012   Osteoporosis 08/18/2012   HTN (hypertension)  08/09/2012   HLD (hyperlipidemia) 08/09/2012    Past Surgical History:  Procedure Laterality Date   ABDOMINAL HYSTERECTOMY  1972   for endometriosis   CATARACT EXTRACTION W/ INTRAOCULAR LENS IMPLANT Right 06/2017   CATARACT EXTRACTION W/ INTRAOCULAR LENS IMPLANT Left 07/2017   COLONOSCOPY Left 03/02/2017   Procedure: COLONOSCOPY;  Surgeon: Virgel Manifold, MD;  Location: [facility] ENDOSCOPY;  Service: Endoscopy;  Laterality: Left;   COLONOSCOPY WITH PROPOFOL N/A 05/04/2016   Procedure: COLONOSCOPY WITH PROPOFOL;  Surgeon: Jonathon Bellows, MD;  Location: [facility] ENDOSCOPY;  Service: Endoscopy;  Laterality: N/A;   ESOPHAGOGASTRODUODENOSCOPY Left 03/02/2017   Procedure: ESOPHAGOGASTRODUODENOSCOPY (EGD);  Surgeon: Virgel Manifold, MD;  Location: [facility] ENDOSCOPY;  Service: Endoscopy;  Laterality: Left;   ESOPHAGOGASTRODUODENOSCOPY (EGD) WITH PROPOFOL N/A 04/08/2017   Procedure: ESOPHAGOGASTRODUODENOSCOPY (EGD) WITH PROPOFOL;  Surgeon: Virgel Manifold, MD;  Location: [facility] ENDOSCOPY;  Service: Endoscopy;  Laterality: N/A;   LITHOTRIPSY     for kidney stone x5   LITHOTRIPSY  09/2017     OB History   No obstetric history on file.     Family History  Problem Relation Age of Onset   Heart disease Mother    Renal Disease Mother    Heart failure Mother    Colon cancer Neg Hx    Breast cancer Neg Hx     Social History  Tobacco Use   Smoking status: Former    Types: Cigarettes    Quit date: 01/19/1991    Years since quitting: 29.6   Smokeless tobacco: Never  Vaping Use   Vaping Use: Never used  Substance Use Topics   Alcohol use: No    Comment: rare wine   Drug use: No    Home Medications Prior to Admission medications   Medication Sig Start Date End Date Taking? Authorizing Provider  ondansetron (ZOFRAN ODT) 4 MG disintegrating tablet '4mg'$  ODT q4 hours prn nausea/vomit 09/18/20  Yes Tellis Spivak, Corene Cornea, MD  oxyCODONE-acetaminophen (PERCOCET) 5-325 MG tablet Take 2 tablets by mouth every  4 (four) hours as needed. 09/18/20  Yes Eileen Croswell, Corene Cornea, MD  amLODipine (NORVASC) 2.5 MG tablet TAKE 1 TABLET DAILY 05/12/20   Tonia Ghent, MD  Ascorbic Acid (VITAMIN C GUMMIE PO) Take by mouth.    [provider]  celecoxib (CELEBREX) 100 MG capsule Take 1 capsule (100 mg total) by mouth daily as needed. 02/22/20   Tonia Ghent, MD  cholecalciferol (VITAMIN D) 1000 units tablet Take 2 tablets (2,000 Units total) by mouth daily. 12/16/15   Tonia Ghent, MD  diazepam (VALIUM) 5 MG tablet TAKE 1 TABLET BY MOUTH 45 MIN PRIOR TO MRI REPEAT AS NEEDED. DRIVER NEEDED [id number]   [provider]  ELDERBERRY PO Take by mouth.    [provider]  hydrocortisone 1 % ointment Apply 1 application topically 2 (two) times daily. 01/07/20   Virgel Manifold, MD  NON FORMULARY Turneric and Ginger Tea    [provider]  simvastatin (ZOCOR) 10 MG tablet TAKE 1 TABLET EVERY OTHER DAY 05/12/20   Tonia Ghent, MD  tamsulosin (FLOMAX) 0.4 MG CAPS capsule Take 0.4 mg by mouth daily. 07/16/19   [provider]    Allergies    Ace inhibitors, Angiotensin receptor blockers, Apple, Aspirin, Bisphosphonates, Nsaids, and Prolia [denosumab]  Review of Systems   Review of Systems  Respiratory:  Negative for shortness of breath.   Cardiovascular:  Negative for chest pain.  Gastrointestinal:  Positive for abdominal pain.  Genitourinary:  Positive for flank pain.  Neurological:  Negative for headaches.  All other systems reviewed and are negative.  Physical Exam Updated Vital Signs BP (!) 175/96 (BP Location: Left Arm)   Pulse 70   Temp 98.7 F (37.1 C) (Oral)   Resp 18   Ht '4\' 9"'$  (1.448 m)   Wt 70.8 kg   SpO2 97%   BMI 33.76 kg/m   Physical Exam Vitals and nursing note reviewed.  Constitutional:      Appearance: She is well-developed.  HENT:     Head: Normocephalic and atraumatic.     Mouth/Throat:     Mouth: Mucous membranes are moist.     Pharynx:  Oropharynx is clear.  Eyes:     Pupils: Pupils are equal, round, and reactive to light.  Cardiovascular:     Rate and Rhythm: Normal rate and regular rhythm.  Pulmonary:     Effort: No respiratory distress.     Breath sounds: No stridor. No wheezing.  Abdominal:     General: Abdomen is flat. There is no distension.  Musculoskeletal:        General: No tenderness. Normal range of motion.     Cervical back: Normal range of motion.  Skin:    General: Skin is warm and dry.  Neurological:     General: No focal deficit present.  Mental Status: She is alert.    ED Results / Procedures / Treatments   Labs (all labs ordered are listed, but only abnormal results are displayed) Labs Reviewed  CBC WITH DIFFERENTIAL/PLATELET - Abnormal; Notable for the following components:      Result Value   WBC 16.1 (*)    Neutro Abs 14.1 (*)    All other components within normal limits  COMPREHENSIVE METABOLIC PANEL - Abnormal; Notable for the following components:   Glucose, Bld 193 (*)    Calcium 8.7 (*)    All other components within normal limits  URINALYSIS, ROUTINE W REFLEX MICROSCOPIC - Abnormal; Notable for the following components:   Hgb urine dipstick SMALL (*)    Ketones, ur 15 (*)    Leukocytes,Ua SMALL (*)    All other components within normal limits  URINALYSIS, MICROSCOPIC (REFLEX) - Abnormal; Notable for the following components:   Bacteria, UA FEW (*)    All other components within normal limits    EKG None  Radiology CT Renal Stone Study  Result Date: 09/18/2020 CLINICAL DATA:  Flank pain.  Concern for kidney stone. EXAM: CT ABDOMEN AND PELVIS WITHOUT CONTRAST TECHNIQUE: Multidetector CT imaging of the abdomen and pelvis was performed following the standard protocol without IV contrast. COMPARISON:  CT abdomen pelvis dated 07/03/2020. FINDINGS: Evaluation of this exam is limited in the absence of intravenous contrast. Lower chest: Minimal bibasilar dependent atelectasis the  visualized lung bases are otherwise clear. There is coronary vascular calcification. No intra-abdominal free air or free fluid. Hepatobiliary: Liver is unremarkable. No intrahepatic biliary ductal dilatation. The gallbladder is unremarkable. Pancreas: Unremarkable. No pancreatic ductal dilatation or surrounding inflammatory changes. Spleen: Normal in size without focal abnormality. Adrenals/Urinary Tract: The adrenal glands are unremarkable. Large right renal upper pole cyst measuring up to 12 cm. A 2 cm high attenuating lesion in the inferior pole of the right kidney is not characterized. Similar findings were seen on the CT of 12/12/2019. Somewhat staghorn calculus in the inferior pole collecting system of the right kidney measures 11 x 16 mm. There is a string of stones in the proximal right ureter measuring approximately 3 cm in length. An additional 6 mm stone is noted more distally in the proximal ureter. There is mild right hydronephrosis and mild right perinephric stranding. There is no hydronephrosis or nephrolithiasis on the left. Left renal parapelvic cysts noted. Left ureter and urinary bladder appear unremarkable. Stomach/Bowel: Severe sigmoid and diffuse colonic diverticulosis without active inflammatory changes. There is no bowel obstruction or active inflammation. The appendix is not visualized with certainty. No inflammatory changes identified in the right lower quadrant. Vascular/Lymphatic: Mild aortoiliac atherosclerotic disease. The IVC is unremarkable. No portal venous gas. There is no adenopathy. Reproductive: Hysterectomy.  No adnexal masses. Other: None Musculoskeletal: Degenerative changes of the lower lumbar spine. No acute osseous pathology IMPRESSION: 1. Right renal inferior pole staghorn calculus as well as a string of adjacent stones in the proximal right ureter with mild right hydronephrosis. 2. Severe colonic diverticulosis. No bowel obstruction. 3. Aortic Atherosclerosis  (ICD10-I70.0). Electronically Signed   By: Anner Crete M.D.   On: 09/18/2020 01:09    Procedures Procedures   Medications Ordered in ED Medications  fentaNYL (SUBLIMAZE) injection 50 mcg (50 mcg Intravenous Given 09/18/20 0142)  lactated ringers bolus 1,000 mL (1,000 mLs Intravenous New Bag/Given 09/18/20 0141)  ondansetron (ZOFRAN) injection 4 mg (4 mg Intravenous Given 09/18/20 0142)  oxyCODONE-acetaminophen (PERCOCET/ROXICET) 5-325 MG per tablet 1 tablet (1  tablet Oral Given 09/18/20 0219)    ED Course  I have reviewed the triage vital signs and the nursing notes.  Pertinent labs & imaging results that were available during my care of the patient were reviewed by me and considered in my medical decision making (see chart for details).    MDM Rules/Calculators/A&P                         Patient had lithotripsy last week. Saw urology without any issues onmonday. Now with nausea, right sided flank and abdominal pian. No fevers. No other GI symptoms.  Scan shows evidence of persistent stones in her ureter.  Some hydronephrosis but no kidney injury.  No urinary tract infection.  This time patient will likely be just treated symptomatically with outpatient urology follow-up.   Final Clinical Impression(s) / ED Diagnoses Final diagnoses:  Nephrolithiasis    Rx / DC Orders ED Discharge Orders          Ordered    oxyCODONE-acetaminophen (PERCOCET) 5-325 MG tablet  Every 4 hours PRN        09/18/20 0220    ondansetron (ZOFRAN ODT) 4 MG disintegrating tablet        09/18/20 0220             Siri Buege, Corene Cornea, MD 09/18/20 [id number]

## 2020-09-18 NOTE — Patient Instructions (Signed)
Drink plenty of fluids and use the pain medicine if needed.  Take care.  Glad to see you.

## 2020-09-18 NOTE — Progress Notes (Signed)
This visit occurred during the SARS-CoV-2 public health emergency.  Safety protocols were in place, including screening questions prior to the visit, additional usage of staff PPE, and extensive cleaning of exam room while observing appropriate contact time as indicated for disinfecting solutions.  Hyperglycemia.  A1c <6.5.  d/w pt.  No DM2 meds.    Recent lithotripsy.  Still with stones on R side per most recent imaging.  ER eval noted.  R sided back/flank/abdominal pain.  She passes some stones ~10/13/20.    Meds, vitals, and allergies reviewed.   ROS: Per HPI unless specifically indicated in ROS section   Nad Ncat Neck supple, no LA Rrr Ctab R flank ttp.  L flank not ttp.  Abd not ttp.  Ext well perfused.   No rash.

## 2020-09-19 DIAGNOSIS — N202 Calculus of kidney with calculus of ureter: Secondary | ICD-10-CM | POA: Diagnosis not present

## 2020-09-21 NOTE — Assessment & Plan Note (Signed)
Discussed with patient about options.  She is going to use oxycodone if needed, drink plenty of fluids, and follow-up with urology.

## 2020-09-21 NOTE — Assessment & Plan Note (Signed)
Not diabetic based on A1c.  Continue work on diet and exercise.  We can recheck periodically.

## 2020-09-29 DIAGNOSIS — M47814 Spondylosis without myelopathy or radiculopathy, thoracic region: Secondary | ICD-10-CM | POA: Diagnosis not present

## 2020-09-29 DIAGNOSIS — M47816 Spondylosis without myelopathy or radiculopathy, lumbar region: Secondary | ICD-10-CM | POA: Diagnosis not present

## 2020-09-29 DIAGNOSIS — N200 Calculus of kidney: Secondary | ICD-10-CM | POA: Diagnosis not present

## 2020-09-29 DIAGNOSIS — N201 Calculus of ureter: Secondary | ICD-10-CM | POA: Diagnosis not present

## 2020-09-29 DIAGNOSIS — N281 Cyst of kidney, acquired: Secondary | ICD-10-CM | POA: Diagnosis not present

## 2020-10-19 DIAGNOSIS — N200 Calculus of kidney: Secondary | ICD-10-CM | POA: Diagnosis not present

## 2020-11-10 DIAGNOSIS — Z23 Encounter for immunization: Secondary | ICD-10-CM | POA: Diagnosis not present

## 2020-11-12 DIAGNOSIS — H903 Sensorineural hearing loss, bilateral: Secondary | ICD-10-CM | POA: Diagnosis not present

## 2020-11-21 ENCOUNTER — Other Ambulatory Visit: Payer: Self-pay | Admitting: Family Medicine

## 2020-12-14 DIAGNOSIS — U071 COVID-19: Secondary | ICD-10-CM | POA: Diagnosis not present

## 2021-01-06 ENCOUNTER — Ambulatory Visit: Payer: Medicare Other

## 2021-01-06 ENCOUNTER — Other Ambulatory Visit: Payer: Self-pay | Admitting: Family Medicine

## 2021-01-06 DIAGNOSIS — Z1231 Encounter for screening mammogram for malignant neoplasm of breast: Secondary | ICD-10-CM

## 2021-01-07 ENCOUNTER — Ambulatory Visit
Admission: RE | Admit: 2021-01-07 | Discharge: 2021-01-07 | Disposition: A | Discharge status: Home / Self Care | Payer: Medicare Other | Source: Ambulatory Visit | Attending: Family Medicine | Admitting: Family Medicine

## 2021-01-07 DIAGNOSIS — Z1231 Encounter for screening mammogram for malignant neoplasm of breast: Secondary | ICD-10-CM

## 2021-01-07 NOTE — Progress Notes (Signed)
Subjective:   Patricia Hoover is a 74 y.o. female who presents for Medicare Annual (Subsequent) preventive examination.  I connected with Patricia Hoover today by telephone and verified that I am speaking with the correct person using two identifiers. Location patient: home Location provider: work Persons participating in the virtual visit: patient, Marine scientist.    I discussed the limitations, risks, security and privacy concerns of performing an evaluation and management service by telephone and the availability of in person appointments. I also discussed with the patient that there may be a patient responsible charge related to this service. The patient expressed understanding and verbally consented to this telephonic visit.    Interactive audio and video telecommunications were attempted between this provider and patient, however failed, due to patient having technical difficulties OR patient did not have access to video capability.  We continued and completed visit with audio only.  Some vital signs may be absent or patient reported.   Time Spent with patient on telephone encounter: 25 minutes  Review of Systems     Cardiac Risk Factors include: advanced age (>41men, >38 women);diabetes mellitus;hypertension     Objective:    Today's Vitals   01/08/21 1115  Weight: 155 lb (70.3 kg)  Height: 4\' 9"  (1.448 m)   Body mass index is 33.54 kg/m.  Advanced Directives 01/08/2021 09/18/2020 01/04/2020 12/12/2019 01/03/2019 03/04/2018 12/19/2017  Does Patient Have a Medical Advance Directive? Yes No Yes Yes Yes Yes Yes  Type of Paramedic of [place];Living will - [place];Living will [place];Living will [place];Living will [place];Living will [place];Living will  Does patient want to make changes to medical advance directive? Yes (MAU/Ambulatory/Procedural Areas - Information  given) - - - - No - Patient declined -  Copy of [place] in Chart? - - No - copy requested - No - copy requested No - copy requested No - copy requested  Would patient like information on creating a medical advance directive? - No - Patient declined - - - - -    Current Medications (verified) Outpatient Encounter Medications as of 01/08/2021  Medication Sig   amLODipine (NORVASC) 2.5 MG tablet TAKE 1 TABLET DAILY   Ascorbic Acid (VITAMIN C GUMMIE PO) Take by mouth.   cholecalciferol (VITAMIN D) 1000 units tablet Take 2 tablets (2,000 Units total) by mouth daily.   hydrocortisone 1 % ointment Apply 1 application topically 2 (two) times daily.   ondansetron (ZOFRAN ODT) 4 MG disintegrating tablet 4mg  ODT q4 hours prn nausea/vomit   oxyCODONE-acetaminophen (PERCOCET) 5-325 MG tablet Take 2 tablets by mouth every 4 (four) hours as needed.   simvastatin (ZOCOR) 10 MG tablet TAKE 1 TABLET EVERY OTHER DAY   No facility-administered encounter medications on file as of 01/08/2021.    Allergies (verified) Ace inhibitors, Angiotensin receptor blockers, Apple, Aspirin, Bisphosphonates, Nsaids, and Prolia [denosumab]   History: Past Medical History:  Diagnosis Date   Arrhythmia    possible hx, resolved prev   Blood transfusion without reported diagnosis 1972   had reaction; had to stop   Carpal tunnel syndrome    Cataract 2019   resolved with surgery   Duodenal ulcer    H/O exercise stress test 2012   normal   Heart murmur    as child   High cholesterol    History of kidney stones    Hx of colonic polyps    Hypertension  Osteoporosis 2010   t score - 3.9   Renal stones    Past Surgical History:  Procedure Laterality Date   ABDOMINAL HYSTERECTOMY  1972   for endometriosis   CATARACT EXTRACTION W/ INTRAOCULAR LENS IMPLANT Right 06/2017   CATARACT EXTRACTION W/ INTRAOCULAR LENS IMPLANT Left 07/2017   COLONOSCOPY Left 03/02/2017   Procedure: COLONOSCOPY;   Surgeon: Virgel Manifold, MD;  Location: [facility] ENDOSCOPY;  Service: Endoscopy;  Laterality: Left;   COLONOSCOPY WITH PROPOFOL N/A 05/04/2016   Procedure: COLONOSCOPY WITH PROPOFOL;  Surgeon: Jonathon Bellows, MD;  Location: [facility] ENDOSCOPY;  Service: Endoscopy;  Laterality: N/A;   ESOPHAGOGASTRODUODENOSCOPY Left 03/02/2017   Procedure: ESOPHAGOGASTRODUODENOSCOPY (EGD);  Surgeon: Virgel Manifold, MD;  Location[facility] ENDOSCOPY;  Service: Endoscopy;  Laterality: Left;   ESOPHAGOGASTRODUODENOSCOPY (EGD) WITH PROPOFOL N/A 04/08/2017   Procedure: ESOPHAGOGASTRODUODENOSCOPY (EGD) WITH PROPOFOL;  Surgeon: Virgel Manifold, MD;  Location: [facility] ENDOSCOPY;  Service: Endoscopy;  Laterality: N/A;   LITHOTRIPSY     for kidney stone x5   LITHOTRIPSY  09/2017   Family History  Problem Relation Age of Onset   Heart disease Mother    Renal Disease Mother    Heart failure Mother    Colon cancer Neg Hx    Breast cancer Neg Hx    Social History   Socioeconomic History   Marital status: Widowed    Spouse name: Not on file   Number of children: Not on file   Years of education: Not on file   Highest education level: Not on file  Occupational History   Occupation: Retired    Comment: Community education officer  Tobacco Use   Smoking status: Former    Types: Cigarettes    Quit date: 01/19/1991    Years since quitting: 29.9   Smokeless tobacco: Never  Vaping Use   Vaping Use: Never used  Substance and Sexual Activity   Alcohol use: No    Comment: rare wine   Drug use: No   Sexual activity: Not on file  Other Topics Concern   Not on file  Social History Narrative   Education:  1 year [employer]   Retired back to [employer] after working in [employer], [employer]   Widowed '93   2 kids   Social Determinants of Radio broadcast assistant Strain: Low Risk    Difficulty of Paying Living Expenses: Not hard at [employer] Insecurity: No Food Insecurity   Worried About Sales executive in the Last Year: Never true   Arboriculturist in the Last Year: Never true  Transportation Needs: No Transportation Needs   Lack of Transportation (Medical): No   Lack of Transportation (Non-Medical): No  Physical Activity: Insufficiently Active   Days of Exercise per Week: 2 days   Minutes of Exercise per Session: 60 min  Stress: No Stress Concern Present   Feeling of Stress : Not at all  Social Connections: Moderately Integrated   Frequency of Communication with Friends and Family: More than three times a week   Frequency of Social Gatherings with Friends and Family: More than three times a week   Attends Religious Services: More than 4 times per year   Active Member of [employer] or Organizations: Yes   Attends Archivist Meetings: More than 4 times per year   Marital Status: Widowed    Tobacco Counseling Counseling given: Not Answered   Clinical Intake:  Pre-visit preparation completed:  Yes  Pain : No/denies pain     BMI - recorded: 33.54 Nutritional Status: BMI > 30  Obese Nutritional Risks: None Diabetes: No  How often do you need to have someone help you when you read instructions, pamphlets, or other written materials from your doctor or pharmacy?: 1 - Never  Diabetic? No  Interpreter Needed?: No  Information entered by :: Orrin Brigham LPN   Activities of Daily Living In your present state of health, do you have any difficulty performing the following activities: 01/08/2021  Hearing? Y  Vision? N  Difficulty concentrating or making decisions? N  Walking or climbing stairs? Y  Dressing or bathing? N  Doing errands, shopping? N  Preparing Food and eating ? N  Using the Toilet? N  In the past six months, have you accidently leaked urine? Y  Comment wears pads  Do you have problems with loss of bowel control? N  Managing your Medications? N  Managing your Finances? N  Housekeeping or managing your Housekeeping? N  Some recent data might be  hidden    Patient Care Team: Tonia Ghent, MD as PCP - General (Family Medicine) Bryson Ha, OD as Consulting Physician (Optometry)  Indicate any recent Medical Services you may have received from other than [facility] providers in the past year (date may be approximate).     Assessment:   This is a routine wellness examination for [place].  Hearing/Vision screen Hearing Screening - Comments:: Scheduled to get hearing aids Vision Screening - Comments:: Last exam 11/2020, wears glasses, [facility]  Dietary issues and exercise activities discussed: Current Exercise Habits: Home exercise routine, Type of exercise: Other - see comments (line dancing), Time (Minutes): 60, Frequency (Times/Week): 2, Weekly Exercise (Minutes/Week): 120, Intensity: Moderate   Goals Addressed             This Visit's Progress    Patient Stated       Would like to maintain current routine       Depression Screen PHQ 2/9 Scores 01/08/2021 12/31/2019 01/03/2019 12/19/2017 12/13/2016 12/10/2015 10/25/2014  PHQ - 2 Score 0 0 0 0 0 0 0  PHQ- 9 Score - - 0 0 0 - -    Fall Risk Fall Risk  01/08/2021 01/04/2020 12/31/2019 01/03/2019 12/19/2017  Falls in the past year? 0 0 0 0 0  Comment - - - - -  Number falls in past yr: 0 0 0 0 -  Injury with Fall? 0 0 0 0 -  Risk for fall due to : No Fall Risks Medication side effect - Medication side effect -  Follow up Falls prevention discussed Falls evaluation completed;Falls prevention discussed Falls evaluation completed Falls evaluation completed;Falls prevention discussed -    FALL RISK PREVENTION PERTAINING TO THE HOME:  Any stairs in or around the home? Yes  If so, are there any without handrails? No  Home free of loose throw rugs in walkways, pet beds, electrical cords, etc? Yes  Adequate lighting in your home to reduce risk of falls? Yes   ASSISTIVE DEVICES UTILIZED TO PREVENT FALLS:  Life alert? No  Use of a cane, walker or w/c? No  Grab bars  in the bathroom? No  Shower chair or bench in shower? No  Elevated toilet seat or a handicapped toilet? No   TIMED UP AND GO:  Was the test performed? No , visit completed over the phone.    Cognitive Function: Normal cognitive status assessed by  this  Nurse Health Advisor. No abnormalities found.   MMSE - Mini Mental State Exam 01/04/2020 01/03/2019 12/19/2017 12/13/2016 12/10/2015  Orientation to time 5 5 5 5 5   Orientation to Place 5 5 5 5 5   Registration 3 3 3 3 3   Attention/ Calculation 5 5 0 0 0  Recall 3 3 3 3 3   Language- name 2 objects - - 0 0 0  Language- repeat 1 1 1 1 1   Language- follow 3 step command - - 3 3 3   Language- read & follow direction - - 0 0 0  Write a sentence - - 0 0 0  Copy design - - 0 0 0  Total score - - 20 20 20         Immunizations Immunization History  Administered Date(s) Administered   Influenza,inj,Quad PF,6+ Mos 10/25/2014, 12/10/2015, 12/13/2016   Influenza-Unspecified 10/10/2013, 12/03/2017, 10/13/2018, 12/19/2019   PFIZER(Purple Top)SARS-COV-2 Vaccination 02/09/2019, 02/27/2019, 10/30/2019, 05/06/2020   Pneumococcal Conjugate-13 10/25/2014   Pneumococcal Polysaccharide-23 06/04/2013   Tdap 07/06/2011   Zoster, Live 07/06/2011    TDAP status: Up to date  Flu Vaccine status: Up to date Patient plans to provided updated vaccine information at next visit.  Pneumococcal vaccine status: Up to date  Covid-19 vaccine status: Completed vaccines Patient plans to provided updated vaccine information at next visit.   Qualifies for Shingles Vaccine? Yes   Zostavax completed Yes   Shingrix Completed?: No.    Education has been provided regarding the importance of this vaccine. Patient has been advised to call insurance company to determine out of pocket expense if they have not yet received this vaccine. Advised may also receive vaccine at local pharmacy or Health Dept. Verbalized acceptance and understanding.  Screening Tests Health  Maintenance  Topic Date Due   Zoster Vaccines- Shingrix (1 of 2) Never done   COVID-19 Vaccine (5 - Booster for Pfizer series) 07/01/2020   INFLUENZA VACCINE  08/18/2020   TETANUS/TDAP  07/05/2021   COLONOSCOPY (Pts 45-83yrs Insurance coverage will need to be confirmed)  03/02/2022   Pneumonia Vaccine 24+ Years old  Completed   DEXA SCAN  Completed   Hepatitis C Screening  Completed   HPV VACCINES  Aged Out    Health Maintenance  Health Maintenance Due  Topic Date Due   Zoster Vaccines- Shingrix (1 of 2) Never done   COVID-19 Vaccine (5 - Booster for Pfizer series) 07/01/2020   INFLUENZA VACCINE  08/18/2020    Colorectal cancer screening: Type of screening: Colonoscopy. Completed 03/02/17. Repeat every 5 years  Mammogram status: due, patient has a scheduled appointment 01/08/21.   Bone Density status: due, last completed 06/19/13, patient will discuss with PCP when decided.  Lung Cancer Screening: (Low Dose CT Chest recommended if Age 48-80 years, 30 pack-year currently smoking OR have quit w/in 15years.) does not qualify.     Additional Screening:  Hepatitis C Screening: does qualify; Completed 10/29/14  Vision Screening: Recommended annual ophthalmology exams for early detection of glaucoma and other disorders of the eye. Is the patient up to date with their annual eye exam?  Yes  Who is the provider or what is the name of the office in which the patient attends annual eye exams? [facility] at [facility]    Dental Screening: Recommended annual dental exams for proper oral hygiene  Community Resource Referral / Chronic Care Management: CRR required this visit?  No   CCM required this visit?  No      Plan:  I have personally reviewed and noted the following in the patients chart:   Medical and social history Use of alcohol, tobacco or illicit drugs  Current medications and supplements including opioid prescriptions.  Functional ability and  status Nutritional status Physical activity Advanced directives List of other physicians Hospitalizations, surgeries, and ER visits in previous 12 months Vitals Screenings to include cognitive, depression, and falls Referrals and appointments  In addition, I have reviewed and discussed with patient certain preventive protocols, quality metrics, and best practice recommendations. A written personalized care plan for preventive services as well as general preventive health recommendations were provided to patient.   Due to this being a telephonic visit, the after visit summary with patients personalized plan was offered to patient via mail or my-chart. Patient would like to access on my-chart.     Loma Messing, LPN   [id number]   Nurse Health Advisor  Nurse Notes: none

## 2021-01-08 ENCOUNTER — Ambulatory Visit
Admission: RE | Admit: 2021-01-08 | Discharge: 2021-01-08 | Disposition: A | Discharge status: Home / Self Care | Payer: Medicare Other | Source: Ambulatory Visit | Attending: Family Medicine | Admitting: Family Medicine

## 2021-01-08 ENCOUNTER — Other Ambulatory Visit: Payer: Self-pay

## 2021-01-08 ENCOUNTER — Ambulatory Visit (INDEPENDENT_AMBULATORY_CARE_PROVIDER_SITE_OTHER): Payer: Medicare Other

## 2021-01-08 VITALS — Ht <= 58 in | Wt 155.0 lb

## 2021-01-08 DIAGNOSIS — Z1231 Encounter for screening mammogram for malignant neoplasm of breast: Secondary | ICD-10-CM | POA: Diagnosis not present

## 2021-01-08 DIAGNOSIS — Z0000 Encounter for general adult medical examination without abnormal findings: Secondary | ICD-10-CM

## 2021-01-08 NOTE — Patient Instructions (Signed)
Patricia Hoover , Thank you for taking time to complete your Medicare Wellness Visit. I appreciate your ongoing commitment to your health goals. Please review the following plan we discussed and let me know if I can assist you in the future.   Screening recommendations/referrals: Colonoscopy: up to date, completed 03/02/17, due 03/02/22 Mammogram: due, last completed 01/08/20, per our conversation , next appointment schedule 01/08/21 Bone Density: due, last completed 06/19/13, discuss with PCP when your ready to schedule an appointment. Recommended yearly ophthalmology/optometry visit for glaucoma screening and checkup Recommended yearly dental visit for hygiene and checkup  Vaccinations: Influenza vaccine: up to date, please bring updated vaccine information to your next appointment Pneumococcal vaccine: up to date Tdap vaccine: up to date, completed 07/06/11, due 07/05/21 Shingles vaccine: Discuss with your local pharmacy Covid-19: up to date, please bring updated vaccine information to your next appointment  Advanced directives: Please bring a copy of Living Will and/or Healthcare Power of Attorney for your chart.   Conditions/risks identified: see problem list  Next appointment: Follow up in one year for your annual wellness visit    Preventive Care 65 Years and Older, Female Preventive care refers to lifestyle choices and visits with your health care provider that can promote health and wellness. What does preventive care include? A yearly physical exam. This is also called an annual well check. Dental exams once or twice a year. Routine eye exams. Ask your health care provider how often you should have your eyes checked. Personal lifestyle choices, including: Daily care of your teeth and gums. Regular physical activity. Eating a healthy diet. Avoiding tobacco and drug use. Limiting alcohol use. Practicing safe sex. Taking low-dose aspirin every day. Taking vitamin and mineral  supplements as recommended by your health care provider. What happens during an annual well check? The services and screenings done by your health care provider during your annual well check will depend on your age, overall health, lifestyle risk factors, and family history of disease. Counseling  Your health care provider may ask you questions about your: Alcohol use. Tobacco use. Drug use. Emotional well-being. Home and relationship well-being. Sexual activity. Eating habits. History of falls. Memory and ability to understand (cognition). Work and work Statistician. Reproductive health. Screening  You may have the following tests or measurements: Height, weight, and BMI. Blood pressure. Lipid and cholesterol levels. These may be checked every 5 years, or more frequently if you are over 8 years old. Skin check. Lung cancer screening. You may have this screening every year starting at age 85 if you have a 30-pack-year history of smoking and currently smoke or have quit within the past 15 years. Fecal occult blood test (FOBT) of the stool. You may have this test every year starting at age 48. Flexible sigmoidoscopy or colonoscopy. You may have a sigmoidoscopy every 5 years or a colonoscopy every 10 years starting at age 79. Hepatitis C blood test. Hepatitis B blood test. Sexually transmitted disease (STD) testing. Diabetes screening. This is done by checking your blood sugar (glucose) after you have not eaten for a while (fasting). You may have this done every 1-3 years. Bone density scan. This is done to screen for osteoporosis. You may have this done starting at age 61. Mammogram. This may be done every 1-2 years. Talk to your health care provider about how often you should have regular mammograms. Talk with your health care provider about your test results, treatment options, and if necessary, the need for more tests. Vaccines  Your health care provider may recommend certain  vaccines, such as: Influenza vaccine. This is recommended every year. Tetanus, diphtheria, and acellular pertussis (Tdap, Td) vaccine. You may need a Td booster every 10 years. Zoster vaccine. You may need this after age 41. Pneumococcal 13-valent conjugate (PCV13) vaccine. One dose is recommended after age 81. Pneumococcal polysaccharide (PPSV23) vaccine. One dose is recommended after age 83. Talk to your health care provider about which screenings and vaccines you need and how often you need them. This information is not intended to replace advice given to you by your health care provider. Make sure you discuss any questions you have with your health care provider. Document Released: 01/31/2015 Document Revised: 09/24/2015 Document Reviewed: 11/05/2014 Elsevier Interactive Patient Education  2017 [place] Prevention in the Home Falls can cause injuries. They can happen to people of all ages. There are many things you can do to make your home safe and to help prevent falls. What can I do on the outside of my home? Regularly fix the edges of walkways and driveways and fix any cracks. Remove anything that might make you trip as you walk through a door, such as a raised step or threshold. Trim any bushes or trees on the path to your home. Use bright outdoor lighting. Clear any walking paths of anything that might make someone trip, such as rocks or tools. Regularly check to see if handrails are loose or broken. Make sure that both sides of any steps have handrails. Any raised decks and porches should have guardrails on the edges. Have any leaves, snow, or ice cleared regularly. Use sand or salt on walking paths during winter. Clean up any spills in your garage right away. This includes oil or grease spills. What can I do in the bathroom? Use night lights. Install grab bars by the toilet and in the tub and shower. Do not use towel bars as grab bars. Use non-skid mats or decals in  the tub or shower. If you need to sit down in the shower, use a plastic, non-slip stool. Keep the floor dry. Clean up any water that spills on the floor as soon as it happens. Remove soap buildup in the tub or shower regularly. Attach bath mats securely with double-sided non-slip rug tape. Do not have throw rugs and other things on the floor that can make you trip. What can I do in the bedroom? Use night lights. Make sure that you have a light by your bed that is easy to reach. Do not use any sheets or blankets that are too big for your bed. They should not hang down onto the floor. Have a firm chair that has side arms. You can use this for support while you get dressed. Do not have throw rugs and other things on the floor that can make you trip. What can I do in the kitchen? Clean up any spills right away. Avoid walking on wet floors. Keep items that you use a lot in easy-to-reach places. If you need to reach something above you, use a strong step stool that has a grab bar. Keep electrical cords out of the way. Do not use floor polish or wax that makes floors slippery. If you must use wax, use non-skid floor wax. Do not have throw rugs and other things on the floor that can make you trip. What can I do with my stairs? Do not leave any items on the stairs. Make sure that there are  handrails on both sides of the stairs and use them. Fix handrails that are broken or loose. Make sure that handrails are as long as the stairways. Check any carpeting to make sure that it is firmly attached to the stairs. Fix any carpet that is loose or worn. Avoid having throw rugs at the top or bottom of the stairs. If you do have throw rugs, attach them to the floor with carpet tape. Make sure that you have a light switch at the top of the stairs and the bottom of the stairs. If you do not have them, ask someone to add them for you. What else can I do to help prevent falls? Wear shoes that: Do not have high  heels. Have rubber bottoms. Are comfortable and fit you well. Are closed at the toe. Do not wear sandals. If you use a stepladder: Make sure that it is fully opened. Do not climb a closed stepladder. Make sure that both sides of the stepladder are locked into place. Ask someone to hold it for you, if possible. Clearly mark and make sure that you can see: Any grab bars or handrails. First and last steps. Where the edge of each step is. Use tools that help you move around (mobility aids) if they are needed. These include: Canes. Walkers. Scooters. Crutches. Turn on the lights when you go into a dark area. Replace any light bulbs as soon as they burn out. Set up your furniture so you have a clear path. Avoid moving your furniture around. If any of your floors are uneven, fix them. If there are any pets around you, be aware of where they are. Review your medicines with your doctor. Some medicines can make you feel dizzy. This can increase your chance of falling. Ask your doctor what other things that you can do to help prevent falls. This information is not intended to replace advice given to you by your health care provider. Make sure you discuss any questions you have with your health care provider. Document Released: 10/31/2008 Document Revised: 06/12/2015 Document Reviewed: 02/08/2014 Elsevier Interactive Patient Education  2017 [employer].

## 2021-01-09 ENCOUNTER — Ambulatory Visit: Payer: Medicare Other | Admitting: Family Medicine

## 2021-01-16 DIAGNOSIS — N200 Calculus of kidney: Secondary | ICD-10-CM | POA: Diagnosis not present

## 2021-02-27 ENCOUNTER — Other Ambulatory Visit: Payer: Self-pay

## 2021-02-27 ENCOUNTER — Ambulatory Visit (INDEPENDENT_AMBULATORY_CARE_PROVIDER_SITE_OTHER): Payer: Medicare Other | Admitting: Family Medicine

## 2021-02-27 ENCOUNTER — Encounter: Payer: Self-pay | Admitting: Family Medicine

## 2021-02-27 VITALS — BP 138/70 | HR 91 | Temp 98.9°F | Ht <= 58 in | Wt 158.0 lb

## 2021-02-27 DIAGNOSIS — M79606 Pain in leg, unspecified: Secondary | ICD-10-CM

## 2021-02-27 DIAGNOSIS — R739 Hyperglycemia, unspecified: Secondary | ICD-10-CM | POA: Diagnosis not present

## 2021-02-27 DIAGNOSIS — I10 Essential (primary) hypertension: Secondary | ICD-10-CM | POA: Diagnosis not present

## 2021-02-27 DIAGNOSIS — E785 Hyperlipidemia, unspecified: Secondary | ICD-10-CM

## 2021-02-27 DIAGNOSIS — Z7189 Other specified counseling: Secondary | ICD-10-CM

## 2021-02-27 DIAGNOSIS — N200 Calculus of kidney: Secondary | ICD-10-CM | POA: Diagnosis not present

## 2021-02-27 DIAGNOSIS — M810 Age-related osteoporosis without current pathological fracture: Secondary | ICD-10-CM | POA: Diagnosis not present

## 2021-02-27 DIAGNOSIS — L819 Disorder of pigmentation, unspecified: Secondary | ICD-10-CM

## 2021-02-27 DIAGNOSIS — Z0000 Encounter for general adult medical examination without abnormal findings: Secondary | ICD-10-CM

## 2021-02-27 LAB — CBC WITH DIFFERENTIAL/PLATELET
Basophils Absolute: 0 10*3/uL (ref 0.0–0.1)
Basophils Relative: 0.4 % (ref 0.0–3.0)
Eosinophils Absolute: 0.1 10*3/uL (ref 0.0–0.7)
Eosinophils Relative: 1.4 % (ref 0.0–5.0)
HCT: 39.4 % (ref 36.0–46.0)
Hemoglobin: 13.1 g/dL (ref 12.0–15.0)
Lymphocytes Relative: 27.6 % (ref 12.0–46.0)
Lymphs Abs: 2.5 10*3/uL (ref 0.7–4.0)
MCHC: 33.2 g/dL (ref 30.0–36.0)
MCV: 87.4 fl (ref 78.0–100.0)
Monocytes Absolute: 0.7 10*3/uL (ref 0.1–1.0)
Monocytes Relative: 7.2 % (ref 3.0–12.0)
Neutro Abs: 5.8 10*3/uL (ref 1.4–7.7)
Neutrophils Relative %: 63.4 % (ref 43.0–77.0)
Platelets: 263 10*3/uL (ref 150.0–400.0)
RBC: 4.51 Mil/uL (ref 3.87–5.11)
RDW: 13.6 % (ref 11.5–15.5)
WBC: 9.2 10*3/uL (ref 4.0–10.5)

## 2021-02-27 LAB — LIPID PANEL
Cholesterol: 161 mg/dL (ref 0–200)
HDL: 50.4 mg/dL (ref >39.00)
LDL Cholesterol: 91 mg/dL (ref 0–99)
NonHDL: 110.27
Total CHOL/HDL Ratio: 3
Triglycerides: 97 mg/dL (ref 0.0–149.0)
VLDL: 19.4 mg/dL (ref 0.0–40.0)

## 2021-02-27 LAB — COMPREHENSIVE METABOLIC PANEL
ALT: 9 U/L (ref 0–35)
AST: 15 U/L (ref 0–37)
Albumin: 3.8 g/dL (ref 3.5–5.2)
Alkaline Phosphatase: 76 U/L (ref 39–117)
BUN: 16 mg/dL (ref 6–23)
CO2: 33 mEq/L — ABNORMAL HIGH (ref 19–32)
Calcium: 9.1 mg/dL (ref 8.4–10.5)
Chloride: 103 mEq/L (ref 96–112)
Creatinine, Ser: 0.62 mg/dL (ref 0.40–1.20)
GFR: 86.53 mL/min (ref >60.00)
Glucose, Bld: 88 mg/dL (ref 70–99)
Potassium: 3.7 mEq/L (ref 3.5–5.1)
Sodium: 141 mEq/L (ref 135–145)
Total Bilirubin: 0.3 mg/dL (ref 0.2–1.2)
Total Protein: 6.3 g/dL (ref 6.0–8.3)

## 2021-02-27 LAB — HEMOGLOBIN A1C: Hgb A1c MFr Bld: 6.8 % — ABNORMAL HIGH (ref 4.6–6.5)

## 2021-02-27 LAB — VITAMIN D 25 HYDROXY (VIT D DEFICIENCY, FRACTURES): VITD: 27.68 ng/mL — ABNORMAL LOW (ref 30.00–100.00)

## 2021-02-27 MED ORDER — AMLODIPINE BESYLATE 2.5 MG PO TABS: 2.5000 mg | ORAL_TABLET | Freq: Every day | ORAL | 3 refills | Status: DC

## 2021-02-27 MED ORDER — POTASSIUM CITRATE ER 15 MEQ (1620 MG) PO TBCR: 1.0000 | EXTENDED_RELEASE_TABLET | Freq: Two times a day (BID) | ORAL | Status: DC

## 2021-02-27 MED ORDER — SIMVASTATIN 10 MG PO TABS: 10.0000 mg | ORAL_TABLET | ORAL | 3 refills | Status: DC

## 2021-02-27 MED ORDER — INDAPAMIDE 2.5 MG PO TABS: 2.5000 mg | ORAL_TABLET | Freq: Every day | ORAL | Status: DC

## 2021-02-27 NOTE — Progress Notes (Signed)
This visit occurred during the SARS-CoV-2 public health emergency.  Safety protocols were in place, including screening questions prior to the visit, additional usage of staff PPE, and extensive cleaning of exam room while observing appropriate contact time as indicated for disinfecting solutions.  Hypertension:    Using medication without problems or lightheadedness: yes Chest pain with exertion: no Edema: no Short of breath:no Labs pending.   Elevated Cholesterol: Using medications without problems: yes Muscle aches: no Diet compliance: yes Exercise: yes  H/o hyperglycemia, labs pending.  D/w pt about diet and exercise.  She has been going dancing for exercise.    She didn't have bladder sx enough to need to take myrbetriq.  She isn't on med currently.    She has urology f/u pending re: stones.  Is on potassium and indapamide.    She had B leg pain with prolonged sitting that improves with walking.  D/w pt.  No radicular pain with walking.  No claudication.  DXA deferred given med intolerance.   Flu 10/2020 PNA up to date.  Tetanus 2013 Shingles d/w pt.  Colonoscopy 2019 Mammogram 2022 Advance directive dw pt. Odetta Pink designated if patient were incapacitated.   Meds, vitals, and allergies reviewed.   PMH and SH reviewed  ROS: Per HPI unless specifically indicated in ROS section   GEN: nad, alert and oriented HEENT: ncat NECK: supple w/o LA CV: rrr. PULM: ctab, no inc wob ABD: soft, +bs EXT: no edema SKIN: no acute rash but chronic hyperpigmentation changes on the B oleranon with mild skin irritation locally.

## 2021-02-27 NOTE — Patient Instructions (Addendum)
Take care.  Glad to see you. Go to the lab on the way out.   If you have mychart we'll likely use that to update you.    Don't change your meds for now.   Try hydrocortisone cream twice a day if the skin is really irritated.  I wouldn't use it all the time.

## 2021-03-01 ENCOUNTER — Encounter: Payer: Self-pay | Admitting: Family Medicine

## 2021-03-01 ENCOUNTER — Other Ambulatory Visit: Payer: Self-pay | Admitting: Family Medicine

## 2021-03-01 DIAGNOSIS — E119 Type 2 diabetes mellitus without complications: Secondary | ICD-10-CM

## 2021-03-01 DIAGNOSIS — E559 Vitamin D deficiency, unspecified: Secondary | ICD-10-CM

## 2021-03-01 DIAGNOSIS — L819 Disorder of pigmentation, unspecified: Secondary | ICD-10-CM | POA: Insufficient documentation

## 2021-03-01 HISTORY — DX: Vitamin D deficiency, unspecified: E55.9

## 2021-03-01 MED ORDER — VITAMIN D3 50 MCG (2000 UT) PO CAPS: 4000.0000 [IU] | ORAL_CAPSULE | Freq: Every day | ORAL | Status: DC

## 2021-03-01 NOTE — Assessment & Plan Note (Signed)
Continue indapamide and amlodipine.  See notes on labs.

## 2021-03-01 NOTE — Assessment & Plan Note (Signed)
Bone density deferred given previous med intolerance.  See notes on vitamin D level.

## 2021-03-01 NOTE — Assessment & Plan Note (Signed)
Advance directive dw pt. Patricia Hoover designated if patient were incapacitated.

## 2021-03-01 NOTE — Assessment & Plan Note (Signed)
Continue work on diet and exercise.  See notes on labs. 

## 2021-03-01 NOTE — Assessment & Plan Note (Signed)
Noted at the elbows.  She can try using hydrocortisone for the irritation.  Routine topical steroid cautions discussed with patient.

## 2021-03-01 NOTE — Assessment & Plan Note (Signed)
DXA deferred given med intolerance.   Flu 10/2020 PNA up to date.  Tetanus 2013 Shingles d/w pt.  Colonoscopy 2019 Mammogram 2022 Advance directive dw pt. Patricia Hoover designated if patient were incapacitated.

## 2021-03-01 NOTE — Assessment & Plan Note (Signed)
Continue potassium and indapamide.  See notes on labs.  I will defer to urology otherwise.

## 2021-03-01 NOTE — Assessment & Plan Note (Signed)
Continue simvastatin.  See notes on labs.  She is taking simvastatin 10 mg every other day and tolerating that.

## 2021-03-01 NOTE — Assessment & Plan Note (Addendum)
Noted with prolonged sitting and improved with walking.  No claudication.  Would continue to try to be as active as possible.  No weakness on exam.

## 2021-03-19 ENCOUNTER — Other Ambulatory Visit: Payer: Self-pay

## 2021-03-19 ENCOUNTER — Ambulatory Visit (INDEPENDENT_AMBULATORY_CARE_PROVIDER_SITE_OTHER): Payer: Medicare Other | Admitting: Family Medicine

## 2021-03-19 ENCOUNTER — Encounter: Payer: Self-pay | Admitting: Family Medicine

## 2021-03-19 DIAGNOSIS — E559 Vitamin D deficiency, unspecified: Secondary | ICD-10-CM

## 2021-03-19 DIAGNOSIS — E119 Type 2 diabetes mellitus without complications: Secondary | ICD-10-CM

## 2021-03-19 NOTE — Patient Instructions (Signed)
Use the eat right diet and recheck in about 3 months.  Labs at the visit.  ?Take care.  Glad to see you. ?

## 2021-03-19 NOTE — Progress Notes (Signed)
This visit occurred during the SARS-CoV-2 public health emergency.  Safety protocols were in place, including screening questions prior to the visit, additional usage of staff PPE, and extensive cleaning of exam room while observing appropriate contact time as indicated for disinfecting solutions. ? ?Diabetes:  ?New diagnosis.  No medications. ?Hypoglycemic episodes: No symptoms ?Hyperglycemic episodes: No symptoms ?Feet problems: No ?Blood Sugars averaging: Not checked. ?Discussed with patient about diabetes pathophysiology, diet and exercise.  Diet handout given to patient, discussed.  We talked about having her make changes in her diet and then we can recheck her labs in a few months to see if she still diabetic.  We can address ongoing diabetes health maintenance items as we go along.  The main thing to focus on right now with diet and exercise.  She agrees. ? ?Vitamin D deficiency discussed with patient.  We can recheck labs later on.  She can increase vitamin D to 4000 units daily in the meantime. ? ?PMH and SH reviewed ? ?Meds, vitals, and allergies reviewed.  ? ?ROS: Per HPI unless specifically indicated in ROS section  ? ?GEN: nad, alert and oriented ?HEENT: ncat ?NECK: supple w/o LA ?CV: rrr. ?PULM: ctab, no inc wob ?ABD: soft, +bs ?EXT: no edema ?SKIN: well perfused.  ?

## 2021-03-20 ENCOUNTER — Encounter: Payer: Self-pay | Admitting: Family Medicine

## 2021-03-22 NOTE — Assessment & Plan Note (Signed)
Increase vitamin D as above and recheck labs in few months. ?

## 2021-03-22 NOTE — Assessment & Plan Note (Signed)
See above.  New diagnosis.  Continue work on diet and exercise.  Recheck labs in few months.  Pathophysiology discussed with patient.  She agrees to plan. ?

## 2021-03-27 DIAGNOSIS — R310 Gross hematuria: Secondary | ICD-10-CM | POA: Diagnosis not present

## 2021-03-27 DIAGNOSIS — N200 Calculus of kidney: Secondary | ICD-10-CM | POA: Diagnosis not present

## 2021-03-27 DIAGNOSIS — R82998 Other abnormal findings in urine: Secondary | ICD-10-CM | POA: Diagnosis not present

## 2021-03-27 DIAGNOSIS — R8290 Unspecified abnormal findings in urine: Secondary | ICD-10-CM | POA: Diagnosis not present

## 2021-04-03 ENCOUNTER — Ambulatory Visit (INDEPENDENT_AMBULATORY_CARE_PROVIDER_SITE_OTHER)
Admission: RE | Admit: 2021-04-03 | Discharge: 2021-04-03 | Disposition: A | Discharge status: Home / Self Care | Payer: Medicare Other | Source: Ambulatory Visit | Attending: Family Medicine | Admitting: Family Medicine

## 2021-04-03 ENCOUNTER — Ambulatory Visit (INDEPENDENT_AMBULATORY_CARE_PROVIDER_SITE_OTHER): Payer: Medicare Other | Admitting: Family Medicine

## 2021-04-03 ENCOUNTER — Encounter: Payer: Self-pay | Admitting: Family Medicine

## 2021-04-03 ENCOUNTER — Other Ambulatory Visit: Payer: Self-pay

## 2021-04-03 VITALS — BP 120/80 | HR 96 | Temp 98.0°F | Ht <= 58 in | Wt 152.0 lb

## 2021-04-03 DIAGNOSIS — M89319 Hypertrophy of bone, unspecified shoulder: Secondary | ICD-10-CM | POA: Diagnosis not present

## 2021-04-03 DIAGNOSIS — U071 COVID-19: Secondary | ICD-10-CM | POA: Diagnosis not present

## 2021-04-03 NOTE — Progress Notes (Signed)
This visit occurred during the SARS-CoV-2 public health emergency.  Safety protocols were in place, including screening questions prior to the visit, additional usage of staff PPE, and extensive cleaning of exam room while observing appropriate contact time as indicated for disinfecting solutions. ? ?Lump on medial L collarbone.  Not painful. No R sided pain or lesion.  No trauma.  No trigger.  No bruising.  Not red.   ? ?She is cutting back on carbs and her weight is coming down.  D/w pt.  Her clothes fit more loosely now.   ? ?Meds, vitals, and allergies reviewed.  ? ?ROS: Per HPI unless specifically indicated in ROS section  ? ?Nad ?Ncat ?Neck supple, no LA ?~4x3cm mass near L medial clavicle.  This feels like a bony mass that is not mobile with the skin.  It does not feel to be connected to her thyroid. ?No similar right-sided lesion. ?Rrr ?Ctab ?No rash. ?  No bruising. ?

## 2021-04-03 NOTE — Patient Instructions (Signed)
Keep working on [employer].  ?Thanks for your effort.  ?Go to the lab on the way out.   If you have mychart we'll likely use that to update you.   Take care.  Glad to see you. ?

## 2021-04-05 DIAGNOSIS — M89319 Hypertrophy of bone, unspecified shoulder: Secondary | ICD-10-CM | POA: Insufficient documentation

## 2021-04-05 NOTE — Assessment & Plan Note (Signed)
Likely benign but asymmetric and discussed with patient about checking plain films.  Not tender to palpation and no ominous findings on exam other than the mild asymmetry noted in the left versus right side.  See notes on x-ray. ?

## 2021-04-16 ENCOUNTER — Telehealth: Payer: Self-pay | Admitting: Family Medicine

## 2021-04-16 NOTE — Telephone Encounter (Signed)
Pt called asking for an order for urine sample. Please advise. ?

## 2021-04-16 NOTE — Telephone Encounter (Signed)
Called and discussed with patient as to why she was needing urine sample done. Patient stated she had a UTI before and wanted to be sure it was gone. Patient is no longer having sx any longer and is feeling better. Advised patient we usually only do these when someone is having sx and needs an OV. Patient states she will call the urologist back as he was the one that seen and treated her for this.  ?

## 2021-06-04 DIAGNOSIS — N200 Calculus of kidney: Secondary | ICD-10-CM | POA: Diagnosis not present

## 2021-06-11 ENCOUNTER — Encounter: Payer: Self-pay | Admitting: Family Medicine

## 2021-06-11 ENCOUNTER — Ambulatory Visit (INDEPENDENT_AMBULATORY_CARE_PROVIDER_SITE_OTHER): Payer: Medicare Other | Admitting: Family Medicine

## 2021-06-11 DIAGNOSIS — E119 Type 2 diabetes mellitus without complications: Secondary | ICD-10-CM | POA: Diagnosis not present

## 2021-06-11 DIAGNOSIS — E559 Vitamin D deficiency, unspecified: Secondary | ICD-10-CM

## 2021-06-11 LAB — HEMOGLOBIN A1C: Hgb A1c MFr Bld: 6.3 % (ref 4.6–6.5)

## 2021-06-11 LAB — VITAMIN D 25 HYDROXY (VIT D DEFICIENCY, FRACTURES): VITD: 42.57 ng/mL (ref 30.00–100.00)

## 2021-06-11 NOTE — Patient Instructions (Signed)
Thanks for your effort.  Go to the lab on the way out.   If you have mychart we'll likely use that to update you.    Take care.  Glad to see you.

## 2021-06-11 NOTE — Progress Notes (Signed)
Diabetes:  No meds.  Hypoglycemic episodes:  no Hyperglycemic episodes: no Feet problems: no Blood Sugars averaging: no eye exam within last year: done this year per patient report.   Down 5 lbs from last visit, cutting back carbs.   Labs pending.    Vit D def.  Labs pending.  On replacement.  See notes on labs.  PMH and SH reviewed  Meds, vitals, and allergies reviewed.   ROS: Per HPI unless specifically indicated in ROS section   GEN: nad, alert and oriented HEENT: ncat NECK: supple w/o LA CV: rrr. PULM: ctab, no inc wob ABD: soft, +bs EXT: no edema SKIN: Well-perfused.

## 2021-06-15 NOTE — Assessment & Plan Note (Signed)
  Vit D def.  Labs pending.  On replacement.  See notes on labs.

## 2021-06-15 NOTE — Assessment & Plan Note (Signed)
Discussed diet and exercise.  See notes on labs.

## 2021-06-21 ENCOUNTER — Ambulatory Visit (HOSPITAL_COMMUNITY)
Admission: EM | Admit: 2021-06-21 | Discharge: 2021-06-21 | Disposition: A | Discharge status: Home / Self Care | Payer: Medicare Other | Attending: Emergency Medicine | Admitting: Emergency Medicine

## 2021-06-21 ENCOUNTER — Encounter (HOSPITAL_COMMUNITY): Payer: Self-pay | Admitting: Emergency Medicine

## 2021-06-21 DIAGNOSIS — S61210A Laceration without foreign body of right index finger without damage to nail, initial encounter: Secondary | ICD-10-CM

## 2021-06-21 MED ORDER — BACITRACIN ZINC 500 UNIT/GM EX OINT: 1.0000 "application " | TOPICAL_OINTMENT | Freq: Two times a day (BID) | CUTANEOUS | 0 refills | Status: DC

## 2021-06-21 MED ORDER — LIDOCAINE HCL (PF) 1 % IJ SOLN
INTRAMUSCULAR | Status: AC
  Filled 2021-06-21: qty 30

## 2021-06-21 NOTE — ED Triage Notes (Signed)
Patient states that there was a knife in the sink today and she cut her right index finger.  Patient denies pain.  Patient believes she is UTD on Tdap.

## 2021-06-21 NOTE — Discharge Instructions (Addendum)
Apply antibiotic ointment twice daily and change dressing as needed.  Please return in 7-10 days to have the stitches removed.

## 2021-06-21 NOTE — ED Provider Notes (Signed)
[facility]    CSN: [id number] Arrival date & time: 06/21/21  1650     History   Chief Complaint Chief Complaint  Patient presents with   Laceration    HPI Patricia Hoover is a 76 y.o. female.  Patient cut finger on a knife that was in the sink today.  Cut to the tip of her right index finger.  No nail damage.  She does not have any pain.  She had moderate bleeding.  She does not take any blood thinners or aspirin. She is up-to-date on her tetanus.  No allergies.  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/2021   Hyperpigmentation 03/01/2021   Diabetes mellitus without complication ([place]) 25/42/7062   Vitamin D deficiency 03/01/2021   Leg pain 05/21/2019   Right leg pain 05/21/2019   Eustachian tube dysfunction 05/02/2019   Healthcare maintenance 01/08/2018   Paresthesia 01/08/2018   Neck pain 08/31/2017   Angioedema 06/15/2017   Peptic ulcer of duodenum    Other specified diseases of esophagus    Hematochezia    External hemorrhoids    Diverticulosis of large intestine without diverticulitis    Internal hemorrhoids    Duodenal ulceration    Advance care planning 12/23/2016   Renal stone 04/20/2016   Heartburn 12/17/2015   Medicare annual wellness visit, subsequent 06/05/2013   Hyperglycemia 06/05/2013   Knee pain 08/31/2012   Osteoporosis 08/18/2012   HTN (hypertension) 08/09/2012   HLD (hyperlipidemia) 08/09/2012    Past Surgical History:  Procedure  Laterality Date   ABDOMINAL HYSTERECTOMY  1972   for endometriosis   CATARACT EXTRACTION W/ INTRAOCULAR LENS IMPLANT Right 06/2017   CATARACT EXTRACTION W/ INTRAOCULAR LENS IMPLANT Left 07/2017   COLONOSCOPY Left 03/02/2017   Procedure: COLONOSCOPY;  Surgeon: Virgel Manifold, MD;  Location: [facility] ENDOSCOPY;  Service: Endoscopy;  Laterality: Left;   COLONOSCOPY WITH PROPOFOL N/A 05/04/2016   Procedure: COLONOSCOPY WITH PROPOFOL;  Surgeon: Jonathon Bellows, MD;  Location: [facility] ENDOSCOPY;  Service: Endoscopy;  Laterality: N/A;   ESOPHAGOGASTRODUODENOSCOPY Left 03/02/2017   Procedure: ESOPHAGOGASTRODUODENOSCOPY (EGD);  Surgeon: Virgel Manifold, MD;  Location: [facility]OSCOPY;  Service: Endoscopy;  Laterality: Left;   ESOPHAGOGASTRODUODENOSCOPY (EGD) WITH PROPOFOL N/A 04/08/2017   Procedure: ESOPHAGOGASTRODUODENOSCOPY (EGD) WITH PROPOFOL;  Surgeon: Virgel Manifold, MD;  Location: [facility] ENDOSCOPY;  Service: Endoscopy;  Laterality: N/A;   LITHOTRIPSY     for kidney stone x5   LITHOTRIPSY  09/2017    OB History   No obstetric history on file.      Home Medications    Prior to Admission medications   Medication Sig Start Date End Date Taking? Authorizing Provider  amLODipine (NORVASC) 2.5 MG tablet Take 1 tablet (2.5 mg total) by mouth daily. 02/27/21  Yes Tonia Ghent, MD  Ascorbic Acid (VITAMIN  C GUMMIE PO) Take by mouth.   Yes [provider]  bacitracin ointment Apply 1 application. topically 2 (two) times daily. 06/21/21  Yes Zay Yeargan, Wells Guiles, PA-C  Cholecalciferol (VITAMIN D3) 50 MCG (2000 UT) capsule Take 2 capsules (4,000 Units total) by mouth daily. 03/01/21  Yes Tonia Ghent, MD  hydrocortisone 1 % ointment Apply 1 application topically 2 (two) times daily. 01/07/20  Yes [name] B, MD  indapamide (LOZOL) 2.5 MG tablet Take 1 tablet (2.5 mg total) by mouth daily. 02/27/21  Yes Tonia Ghent, MD  Potassium Citrate 15 MEQ (1620 MG) TBCR Take 1 tablet by  mouth in the morning and at bedtime. 02/27/21  Yes Tonia Ghent, MD  simvastatin (ZOCOR) 10 MG tablet Take 1 tablet (10 mg total) by mouth every other day. 02/27/21  Yes Tonia Ghent, MD    Family History Family History  Problem Relation Age of Onset   Heart disease Mother    Renal Disease Mother    Heart failure Mother    Colon cancer Neg Hx    Breast cancer Neg Hx     Social History Social History   Tobacco Use   Smoking status: Former    Types: Cigarettes    Quit date: 01/19/1991    Years since quitting: 30.4   Smokeless tobacco: Never  Vaping Use   Vaping Use: Never used  Substance Use Topics   Alcohol use: No    Comment: rare wine   Drug use: No     Allergies   Ace inhibitors, Angiotensin receptor blockers, Aspirin, Bisphosphonates, Nsaids, and Prolia [denosumab]   Review of Systems Review of Systems Per HPI  Physical Exam Triage Vital Signs ED Triage Vitals  Enc Vitals Group     BP 06/21/21 1726 (!) 159/77     Pulse Rate 06/21/21 1726 60     Resp 06/21/21 1726 18     Temp 06/21/21 1726 99.2 F (37.3 C)     Temp Source 06/21/21 1726 Oral     SpO2 06/21/21 1726 97 %     Weight 06/21/21 1729 147 lb (66.7 kg)     Height 06/21/21 1729 '4\' 9"'$  (1.448 m)     Head Circumference --      Peak Flow --      Pain Score 06/21/21 1729 0     Pain Loc --      Pain Edu? --      Excl. in [place]? --    No data found.  Updated Vital Signs BP (!) 159/77 (BP Location: Left Arm)   Pulse 60   Temp 99.2 F (37.3 C) (Oral)   Resp 18   Ht '4\' 9"'$  (1.448 m)   Wt 147 lb (66.7 kg)   SpO2 97%   BMI 31.81 kg/m    Physical Exam Vitals and nursing note reviewed.  Constitutional:      Appearance: Normal appearance.     Comments: Very pleasant and polite  Cardiovascular:     Rate and Rhythm: Normal rate and regular rhythm.     Heart sounds: Normal heart sounds.  Pulmonary:     Effort: Pulmonary effort is normal.     Breath sounds: Normal breath sounds and air entry.   Musculoskeletal:     Comments: Full ROM, sensation intact, some pain with palpation of fingertip  Skin:    Comments: 3 cm laceration to the tip of the medial aspect of right index finger, no nail damage, bleeding  is controlled  Neurological:     Mental Status: She is alert.     UC Treatments / Results  Labs (all labs ordered are listed, but only abnormal results are displayed) Labs Reviewed - No data to display  EKG  Radiology No results found.  Procedures Laceration Repair  Date/Time: 06/21/2021 6:51 PM Performed by: Les Pou, PA-C Authorized by: Les Pou, PA-C   Consent:    Consent obtained:  Verbal   Consent given by:  Patient Laceration details:    Location:  Finger   Finger location:  R index finger   Length (cm):  3 Treatment:    Area cleansed with:  Chlorhexidine and saline   Irrigation solution:  Sterile saline   Irrigation method:  Syringe Skin repair:    Repair method:  Sutures   Suture size:  5-0 and 6-0   Suture material:  Prolene   Suture technique:  Simple interrupted   Number of sutures:  3 Approximation:    Approximation:  Close Repair type:    Repair type:  Simple Post-procedure details:    Dressing:  Antibiotic ointment and non-adherent dressing   Procedure completion:  Tolerated   Medications Ordered in UC Medications - No data to display  Initial Impression / Assessment and Plan / UC Course  I have reviewed the triage vital signs and the nursing notes.  Pertinent labs & imaging results that were available during my care of the patient were reviewed by me and considered in my medical decision making (see chart for details).  Laceration closed with 3 simple interrupted sutures.  Patient tolerated procedure well.  I applied antibiotic ointment to the area and covered with nonstick gauze.  I recommend patient use antibiotic ointment twice daily and change dressing as needed.  She can return in about 10 days to have the stitches  removed.  She is up-to-date on her tetanus at this time.  We discussed signs of infection to look for and other return precautions.  Patient agrees with plan and is discharged in stable condition.  Final Clinical Impressions(s) / UC Diagnoses   Final diagnoses:  Laceration of right index finger without foreign body without damage to nail, initial encounter     Discharge Instructions      Apply antibiotic ointment twice daily and change dressing as needed.  Please return in 7-10 days to have the stitches removed.     ED Prescriptions     Medication Sig Dispense Auth. Provider   bacitracin ointment Apply 1 application. topically 2 (two) times daily. 14 g Chaniyah Jahr, Wells Guiles, PA-C      PDMP not reviewed this encounter.   Mirka Barbone, Vernice Jefferson 06/21/21 1853

## 2021-06-28 ENCOUNTER — Ambulatory Visit (HOSPITAL_COMMUNITY)
Admission: EM | Admit: 2021-06-28 | Discharge: 2021-06-28 | Disposition: A | Discharge status: Home / Self Care | Payer: Medicare Other

## 2021-06-28 NOTE — ED Triage Notes (Signed)
   Nurse Visit   Date of Encounter: 06/28/2021 ID: Patricia Hoover, DOB 04/25/1945, MRN [id number]  PCP:  Tonia Ghent, MD     Visit Details   VS:  There were no vitals taken for this visit. , BMI There is no height or weight on file to calculate BMI.  Wt Readings from Last 3 Encounters:  06/21/21 66.7 kg  06/11/21 66.7 kg  04/03/21 68.9 kg     Reason for visit: suture removal Performed today: Vitals Changes (medications, testing, etc.) : site without redness or drainage. 3 sutures removed without problems. Length of Visit: 15 minutes    Medications Adjustments/Labs and Tests Ordered: No orders of the defined types were placed in this encounter.  No orders of the defined types were placed in this encounter.    Signed, Truddie Hidden, RN  06/28/2021 2:50 PM

## 2021-07-03 ENCOUNTER — Telehealth: Payer: Self-pay

## 2021-07-03 NOTE — Telephone Encounter (Signed)
I spoke with pt and pt notified as instructed and pt voiced understanding; pt said she would monitor BP over weekend and will call Dr Damita Dunnings first of next wk with  update. Pt said earlier today pt was lifting chairs in the heat. Pt said she went home cooled off and rested and pts H/A and nausea resolved.pt said she plans on resting this weekend and staying cool, and drinking fluids and will monitor BP. UC & ED precautions given and pt voiced understanding. Sending note to Dr Damita Dunnings.

## 2021-07-03 NOTE — Telephone Encounter (Signed)
Patient called in stating that Patricia Hoover asked for her to call in with a blood pressure reading. States it is 154/86, she has had a headache all morning long, but did deny lightlessness and blurry vision. Wanting to know if this is a good reading or if it is high.

## 2021-07-03 NOTE — Telephone Encounter (Addendum)
Please triage patient about HA.  That BP is slightly up, would rec rechecking BP at home at rest a few times.  The issue is making sure she doesn't need eval for HA today.  Thanks.

## 2021-07-05 NOTE — Telephone Encounter (Signed)
Noted. Thanks.

## 2021-07-08 DIAGNOSIS — N200 Calculus of kidney: Secondary | ICD-10-CM | POA: Diagnosis not present

## 2021-08-31 ENCOUNTER — Ambulatory Visit: Payer: Medicare Other | Admitting: Family Medicine

## 2021-09-01 DIAGNOSIS — Z20822 Contact with and (suspected) exposure to covid-19: Secondary | ICD-10-CM | POA: Diagnosis not present

## 2021-09-01 DIAGNOSIS — J069 Acute upper respiratory infection, unspecified: Secondary | ICD-10-CM | POA: Diagnosis not present

## 2021-09-09 ENCOUNTER — Ambulatory Visit (INDEPENDENT_AMBULATORY_CARE_PROVIDER_SITE_OTHER): Payer: Medicare Other | Admitting: Family Medicine

## 2021-09-09 VITALS — BP 132/74 | HR 76 | Temp 98.0°F | Wt 148.0 lb

## 2021-09-09 DIAGNOSIS — U071 COVID-19: Secondary | ICD-10-CM | POA: Diagnosis not present

## 2021-09-09 MED ORDER — MOLNUPIRAVIR EUA 200MG CAPSULE: 4.0000 | ORAL_CAPSULE | Freq: Two times a day (BID) | ORAL | 0 refills | Status: AC

## 2021-09-09 NOTE — Progress Notes (Signed)
Subjective:     Patricia Hoover is a 76 y.o. female presenting for Nasal Congestion, Generalized Body Aches, and Cough     Cough Associated symptoms include myalgias and rhinorrhea. Pertinent negatives include no chills, fever or headaches.  Sinusitis This is a new problem. The current episode started 1 to 4 weeks ago (08/27/2021). Associated symptoms include congestion and coughing. Pertinent negatives include no chills, headaches or sinus pressure.     Negative covid on 8/12 Covid negative 8/19  Exposure to covid on 8/19  Taking otc for cough w/ improvement   Review of Systems  Constitutional:  Positive for appetite change. Negative for chills and fever.  HENT:  Positive for congestion and rhinorrhea. Negative for sinus pressure.   Respiratory:  Positive for cough.   Gastrointestinal:  Positive for vomiting. Negative for constipation, diarrhea and nausea.  Musculoskeletal:  Positive for arthralgias and myalgias.  Neurological:  Negative for headaches.     Social History   Tobacco Use  Smoking Status Former   Types: Cigarettes   Quit date: 01/19/1991   Years since quitting: 30.6  Smokeless Tobacco Never        Objective:    BP Readings from Last 3 Encounters:  09/09/21 132/74  06/28/21 (!) 160/69  06/21/21 (!) 159/77   Wt Readings from Last 3 Encounters:  09/09/21 148 lb (67.1 kg)  06/21/21 147 lb (66.7 kg)  06/11/21 147 lb (66.7 kg)    BP 132/74   Pulse 76   Temp 98 F (36.7 C) (Temporal)   Wt 148 lb (67.1 kg)   SpO2 98%   BMI 32.03 kg/m    Physical Exam Constitutional:      General: She is not in acute distress.    Appearance: She is well-developed. She is not diaphoretic.  HENT:     Head: Normocephalic and atraumatic.     Right Ear: External ear normal.     Left Ear: External ear normal.     Nose: Mucosal edema present.     Right Sinus: No maxillary sinus tenderness or frontal sinus tenderness.     Left Sinus: No maxillary sinus tenderness  or frontal sinus tenderness.  Eyes:     General: No scleral icterus.    Conjunctiva/sclera: Conjunctivae normal.  Cardiovascular:     Rate and Rhythm: Normal rate and regular rhythm.     Heart sounds: Normal heart sounds. No murmur heard. Pulmonary:     Effort: Pulmonary effort is normal. No respiratory distress.     Breath sounds: Normal breath sounds.  Musculoskeletal:     Cervical back: Neck supple.  Lymphadenopathy:     Cervical: No cervical adenopathy.  Skin:    General: Skin is warm and dry.     Capillary Refill: Capillary refill takes less than 2 seconds.  Neurological:     Mental Status: She is alert.     Covid positive    Assessment & Plan:   Problem List Items Addressed This Visit   None Visit Diagnoses     COVID-19 virus infection    -  Primary   Relevant Medications   molnupiravir EUA (LAGEVRIO) 200 mg CAPS capsule      Patient is at increased risk for developing severe covid due to HTN and age. They are eligible for anti-viral medication.   Discussed Molnupiravir and they would like to start. Due to risk for fewer side effects.   Medication sent to pharmacy  Reviewed ER and return precautions  Reviewed  isolation guidelines.    Return if symptoms worsen or fail to improve.  Lesleigh Noe, MD

## 2021-09-09 NOTE — Patient Instructions (Addendum)
Tylenol and ibuprofen for aches  OK if lightheaded to hold amlodipine (Norvasc) 2.5 mg  Otherwise, can continue all medications   Call or ER - chest pain, worsening breathing, new or worsening fatigue  Assuming symptoms are improving  Isolate through 09/10/2021 Masks are ok from 8/25-8/29  OK to remove mask starting 09/16/2021

## 2021-09-14 ENCOUNTER — Ambulatory Visit: Payer: Medicare Other | Admitting: Family Medicine

## 2021-09-14 ENCOUNTER — Ambulatory Visit: Payer: Medicare Other | Admitting: Nurse Practitioner

## 2021-09-17 ENCOUNTER — Ambulatory Visit (INDEPENDENT_AMBULATORY_CARE_PROVIDER_SITE_OTHER): Payer: Medicare Other | Admitting: Family Medicine

## 2021-09-17 ENCOUNTER — Encounter: Payer: Self-pay | Admitting: Family Medicine

## 2021-09-17 DIAGNOSIS — L989 Disorder of the skin and subcutaneous tissue, unspecified: Secondary | ICD-10-CM

## 2021-09-17 NOTE — Patient Instructions (Signed)
Try not to irritate the area.  Use warm compresses and try not to put the hair under tension.   Take care.  Glad to see you. Update me as needed.

## 2021-09-17 NOTE — Progress Notes (Signed)
She had antecedent sx but tested positive for covid on 09/09/21.  Took molnupiravir.  Done with course.  No ADE on med.  She feels better in the meantime. No fevers.  Not SOB.  Only minimal cough now.    She wanted a scalp lesion evaluated.  See exam.  Meds, vitals, and allergies reviewed.   ROS: Per HPI unless specifically indicated in ROS section   Nad Wearing an N95 mask. Ncat except for small ccalp lesion.  Posterior midline. Small amount of irritation/cystic lesion noted but no spreading erythema. No LA Rrr Ctab

## 2021-09-21 DIAGNOSIS — L989 Disorder of the skin and subcutaneous tissue, unspecified: Secondary | ICD-10-CM | POA: Insufficient documentation

## 2021-09-21 NOTE — Assessment & Plan Note (Addendum)
It looks like she has minimal irritation of the cystic lesion on the posterior scalp but no spreading erythema and no signs of infection that would need incision and drainage.  It looks benign and I think it makes sense to leave it alone for now.  She can use warm compresses and I asked her not to put the hair under tension locally.  Update me as needed.  As a separate issue she was treated appropriately for COVID her lungs are clear and she clearly feels better in the meantime.  She can update [place] as needed.

## 2021-11-29 DIAGNOSIS — Z23 Encounter for immunization: Secondary | ICD-10-CM | POA: Diagnosis not present

## 2021-12-28 ENCOUNTER — Ambulatory Visit (INDEPENDENT_AMBULATORY_CARE_PROVIDER_SITE_OTHER): Payer: Medicare Other | Admitting: Family Medicine

## 2021-12-28 ENCOUNTER — Encounter: Payer: Self-pay | Admitting: Family Medicine

## 2021-12-28 ENCOUNTER — Telehealth: Payer: Self-pay | Admitting: Family Medicine

## 2021-12-28 VITALS — BP 124/74 | HR 87 | Temp 97.6°F | Ht <= 58 in | Wt 149.0 lb

## 2021-12-28 DIAGNOSIS — R739 Hyperglycemia, unspecified: Secondary | ICD-10-CM | POA: Diagnosis not present

## 2021-12-28 DIAGNOSIS — E119 Type 2 diabetes mellitus without complications: Secondary | ICD-10-CM

## 2021-12-28 LAB — POCT GLYCOSYLATED HEMOGLOBIN (HGB A1C): Hemoglobin A1C: 6.2 % — AB (ref 4.0–5.6)

## 2021-12-28 NOTE — Progress Notes (Unsigned)
Scalp lesion is less irritated.  Not noted no exam today- not palpable.   Sugar follow up.  H/o DM2.  Last A1c 6.3.  no meds.  Still on statin, amlodipine and indapamide.  She is active and working on her diet.  She is line dancing.    Meds, vitals, and allergies reviewed.   ROS: Per HPI unless specifically indicated in ROS section    GEN: nad, alert and oriented HEENT: ncat NECK: supple w/o LA CV: rrr.  PULM: ctab, no inc wob ABD: soft, +bs EXT: no edema SKIN: well perfused.

## 2021-12-28 NOTE — Telephone Encounter (Signed)
LVM for pt to rtn my call to schedule AWV with [facility] call back # [phone] 

## 2021-12-28 NOTE — Patient Instructions (Addendum)
You are not diabetic based on your A1c.   Keep working on diet and exercise and recheck labs prior to a physical next year.   Take care.  Glad to see you. Thanks for your effort.   I would get covid vaccine done when possible. Check with your insurance to see if they will cover the shingles and tetanus shots. RSV vaccine after that.

## 2021-12-29 DIAGNOSIS — Z23 Encounter for immunization: Secondary | ICD-10-CM | POA: Diagnosis not present

## 2021-12-30 NOTE — Assessment & Plan Note (Signed)
A1c discussed with patient. Not diabetic based on your A1c.   Keep working on diet and exercise and recheck labs prior to a physical next year.    I would get covid vaccine done when possible. I asked her to check with her insurance to see if they will cover the shingles and tetanus shots. RSV vaccine after that.

## 2022-02-21 ENCOUNTER — Other Ambulatory Visit: Payer: Self-pay | Admitting: Family Medicine

## 2022-02-21 DIAGNOSIS — M810 Age-related osteoporosis without current pathological fracture: Secondary | ICD-10-CM

## 2022-02-21 DIAGNOSIS — I10 Essential (primary) hypertension: Secondary | ICD-10-CM

## 2022-02-23 ENCOUNTER — Telehealth: Payer: Self-pay | Admitting: Family Medicine

## 2022-02-23 ENCOUNTER — Other Ambulatory Visit (INDEPENDENT_AMBULATORY_CARE_PROVIDER_SITE_OTHER): Payer: Medicare Other

## 2022-02-23 DIAGNOSIS — M810 Age-related osteoporosis without current pathological fracture: Secondary | ICD-10-CM

## 2022-02-23 DIAGNOSIS — I10 Essential (primary) hypertension: Secondary | ICD-10-CM

## 2022-02-23 LAB — COMPREHENSIVE METABOLIC PANEL
ALT: 8 U/L (ref 0–35)
AST: 11 U/L (ref 0–37)
Albumin: 4.1 g/dL (ref 3.5–5.2)
Alkaline Phosphatase: 67 U/L (ref 39–117)
BUN: 19 mg/dL (ref 6–23)
CO2: 27 mEq/L (ref 19–32)
Calcium: 8.8 mg/dL (ref 8.4–10.5)
Chloride: 104 mEq/L (ref 96–112)
Creatinine, Ser: 0.55 mg/dL (ref 0.40–1.20)
GFR: 88.45 mL/min (ref >60.00)
Glucose, Bld: 101 mg/dL — ABNORMAL HIGH (ref 70–99)
Potassium: 3.3 mEq/L — ABNORMAL LOW (ref 3.5–5.1)
Sodium: 141 mEq/L (ref 135–145)
Total Bilirubin: 0.3 mg/dL (ref 0.2–1.2)
Total Protein: 6.8 g/dL (ref 6.0–8.3)

## 2022-02-23 LAB — LIPID PANEL
Cholesterol: 193 mg/dL (ref 0–200)
HDL: 52.8 mg/dL (ref >39.00)
LDL Cholesterol: 123 mg/dL — ABNORMAL HIGH (ref 0–99)
NonHDL: 140.09
Total CHOL/HDL Ratio: 4
Triglycerides: 83 mg/dL (ref 0.0–149.0)
VLDL: 16.6 mg/dL (ref 0.0–40.0)

## 2022-02-23 LAB — CBC WITH DIFFERENTIAL/PLATELET
Basophils Absolute: 0 10*3/uL (ref 0.0–0.1)
Basophils Relative: 0.5 % (ref 0.0–3.0)
Eosinophils Absolute: 0.1 10*3/uL (ref 0.0–0.7)
Eosinophils Relative: 1.7 % (ref 0.0–5.0)
HCT: 39.6 % (ref 36.0–46.0)
Hemoglobin: 13.5 g/dL (ref 12.0–15.0)
Lymphocytes Relative: 30.8 % (ref 12.0–46.0)
Lymphs Abs: 2.4 10*3/uL (ref 0.7–4.0)
MCHC: 34.2 g/dL (ref 30.0–36.0)
MCV: 87 fl (ref 78.0–100.0)
Monocytes Absolute: 0.5 10*3/uL (ref 0.1–1.0)
Monocytes Relative: 5.9 % (ref 3.0–12.0)
Neutro Abs: 4.7 10*3/uL (ref 1.4–7.7)
Neutrophils Relative %: 61.1 % (ref 43.0–77.0)
Platelets: 259 10*3/uL (ref 150.0–400.0)
RBC: 4.55 Mil/uL (ref 3.87–5.11)
RDW: 13.8 % (ref 11.5–15.5)
WBC: 7.7 10*3/uL (ref 4.0–10.5)

## 2022-02-23 LAB — VITAMIN D 25 HYDROXY (VIT D DEFICIENCY, FRACTURES): VITD: 53.24 ng/mL (ref 30.00–100.00)

## 2022-02-23 NOTE — Telephone Encounter (Signed)
Patient is requesting a call from [facility] regarding her labs. She had lab drawn today 02/23/22 and called stating she have a question. Did not tell me what kind of question she had.

## 2022-03-02 ENCOUNTER — Ambulatory Visit (INDEPENDENT_AMBULATORY_CARE_PROVIDER_SITE_OTHER): Payer: Medicare Other | Admitting: Family Medicine

## 2022-03-02 ENCOUNTER — Encounter: Payer: Self-pay | Admitting: Family Medicine

## 2022-03-02 VITALS — BP 120/62 | HR 90 | Temp 97.7°F | Ht <= 58 in | Wt 151.0 lb

## 2022-03-02 DIAGNOSIS — I10 Essential (primary) hypertension: Secondary | ICD-10-CM

## 2022-03-02 DIAGNOSIS — N200 Calculus of kidney: Secondary | ICD-10-CM | POA: Diagnosis not present

## 2022-03-02 DIAGNOSIS — Z0000 Encounter for general adult medical examination without abnormal findings: Secondary | ICD-10-CM

## 2022-03-02 DIAGNOSIS — E785 Hyperlipidemia, unspecified: Secondary | ICD-10-CM

## 2022-03-02 DIAGNOSIS — Z7189 Other specified counseling: Secondary | ICD-10-CM

## 2022-03-02 MED ORDER — AMLODIPINE BESYLATE 2.5 MG PO TABS: 2.5000 mg | ORAL_TABLET | Freq: Every day | ORAL | 3 refills | Status: DC

## 2022-03-02 MED ORDER — SIMVASTATIN 10 MG PO TABS: 10.0000 mg | ORAL_TABLET | ORAL | 3 refills | Status: DC

## 2022-03-02 NOTE — Progress Notes (Unsigned)
I have personally reviewed the Medicare Annual Wellness questionnaire and have noted 1. The patient's medical and social history 2. Their use of alcohol, tobacco or illicit drugs 3. Their current medications and supplements 4. The patient's functional ability including ADL's, fall risks, home safety risks and hearing or visual             impairment. 5. Diet and physical activities 6. Evidence for depression or mood disorders  The patients weight, height, BMI have been recorded in the chart and visual acuity is per eye clinic.  I have made referrals, counseling and provided education to the patient based review of the above and I have provided the pt with a written personalized care plan for preventive services.  Provider list updated- see scanned forms.  Routine anticipatory guidance given to patient.  See health maintenance. The possibility exists that previously documented standard health maintenance information may have been brought forward from a previous encounter into this note.  If needed, that same information has been updated to reflect the current situation based on today's encounter.    Flu Shingles PNA Tetanus Colon  Breast cancer screening Advance directive Cognitive function addressed- see scanned forms- and if abnormal then additional documentation follows.   In addition to [facility] Wellness, follow up visit for the below conditions:  Hypertension:    Using medication without problems or lightheadedness: yes Chest pain with exertion:no Edema:no Short of breath:no  Elevated Cholesterol: Using medications without problems: yes Muscle aches: no Diet compliance: yes Exercise: yes Labs d/w pt.  She has tolerated simvastatin and amlodipine as is.    H/o renal stones.  D/w pt about urology follow up.  Needs urology est care closer to home.  Still on indapamide and potassium at baseline.    PMH and SH reviewed  Meds, vitals, and allergies reviewed.   ROS: Per HPI.   Unless specifically indicated otherwise in HPI, the patient denies:  General: fever. Eyes: acute vision changes ENT: sore throat Cardiovascular: chest pain Respiratory: SOB GI: vomiting GU: dysuria Musculoskeletal: acute back pain Derm: acute rash Neuro: acute motor dysfunction Psych: worsening mood Endocrine: polydipsia Heme: bleeding Allergy: hayfever  GEN: nad, alert and oriented HEENT: mucous membranes moist NECK: supple w/o LA CV: rrr. PULM: ctab, no inc wob ABD: soft, +bs EXT: no edema SKIN: no acute rash

## 2022-03-02 NOTE — Patient Instructions (Addendum)
You should get a call about seeing urology.  Take care.  Glad to see you. Let me know if you don't get a call about seeing GI.    You can call for a mammogram at the [facility] [place] [phone]

## 2022-03-03 NOTE — Assessment & Plan Note (Signed)
Continue simvastatin.  She is tolerated amlodipine and simvastatin as is.

## 2022-03-03 NOTE — Assessment & Plan Note (Signed)
Flu previously done Shingles discussed with patient, Zostavax previously done. PNA previously done Tetanus 2013, discussed with patient. COVID-vaccine previously done.   RSV vaccine discussed with patient. Colon 2019, discussed with patient about follow-up. Breast cancer screening 2022, discussed with patient. Bone density test deferred given med intolerance, discussed with patient. Advance directive-Tracy Keels designated first and then Rozelle Logan designated if needed, if patient were incapacitated. Cognitive function addressed- see scanned forms- and if abnormal then additional documentation follows.

## 2022-03-03 NOTE — Assessment & Plan Note (Signed)
Advance directive-Patricia Hoover designated first and then Patricia Hoover designated if needed, if patient were incapacitated.

## 2022-03-03 NOTE — Assessment & Plan Note (Signed)
Refer to urology.  Discussed rationale for indapamide use with potassium.

## 2022-03-03 NOTE — Assessment & Plan Note (Signed)
Continue amlodipine and indapamide/potassium.

## 2022-03-08 DIAGNOSIS — N201 Calculus of ureter: Secondary | ICD-10-CM | POA: Diagnosis not present

## 2022-03-08 DIAGNOSIS — N200 Calculus of kidney: Secondary | ICD-10-CM | POA: Diagnosis not present

## 2022-03-08 DIAGNOSIS — R8290 Unspecified abnormal findings in urine: Secondary | ICD-10-CM | POA: Diagnosis not present

## 2022-03-16 ENCOUNTER — Ambulatory Visit: Payer: Medicare Other

## 2022-03-19 ENCOUNTER — Ambulatory Visit: Payer: Medicare Other

## 2022-04-20 ENCOUNTER — Encounter: Payer: Self-pay | Admitting: Family Medicine

## 2022-04-20 ENCOUNTER — Ambulatory Visit (INDEPENDENT_AMBULATORY_CARE_PROVIDER_SITE_OTHER): Payer: Medicare Other | Admitting: Family Medicine

## 2022-04-20 VITALS — BP 122/62 | HR 87 | Temp 98.0°F | Ht <= 58 in | Wt 153.0 lb

## 2022-04-20 DIAGNOSIS — R2689 Other abnormalities of gait and mobility: Secondary | ICD-10-CM | POA: Diagnosis not present

## 2022-04-20 DIAGNOSIS — R300 Dysuria: Secondary | ICD-10-CM

## 2022-04-20 DIAGNOSIS — M2569 Stiffness of other specified joint, not elsewhere classified: Secondary | ICD-10-CM

## 2022-04-20 LAB — POC URINALSYSI DIPSTICK (AUTOMATED)
Bilirubin, UA: NEGATIVE
Glucose, UA: NEGATIVE
Ketones, UA: NEGATIVE
Nitrite, UA: NEGATIVE
Protein, UA: POSITIVE — AB
Spec Grav, UA: 1.02 (ref 1.010–1.025)
Urobilinogen, UA: 0.2 E.U./dL
pH, UA: 6 (ref 5.0–8.0)

## 2022-04-20 NOTE — Progress Notes (Unsigned)
Prev UTI noted.  She had prev abd u/a.  She wanted to make sure she didn't have UTI currently.  No fevers.  No chills.  No abd pain.  Urinary sx: has frequency at baseline.  No burning with urination. She is going to leave a urine sample when possible.    B leg stiffness. No pain but stiff after prolonged sitting.  Then gait/leg stiffness improves after initial walking.  NSAID intolerant.  Discussed PT eval and tx.  No weakness.    Meds, vitals, and allergies reviewed.   ROS: Per HPI unless specifically indicated in ROS section   Nad Ncat Neck supple, no LA Rrr Ctab Abd soft, not ttp Ext w/o edema.  S/S grossly wnl BLE Back not ttp B or midline.  Gait is initially slightly slow after sitting but then normalizes upon standing and walking some.  Prev imaging d/w pt.  At L4-5 there is a broad-based disc bulge. Severe bilateral facet arthropathy with ligamentum flavum infolding. Bilateral lateral recess narrowing. Mild bilateral foraminal stenosis. Mild grade 1 anterolisthesis of L4 on L5 secondary to facet disease.

## 2022-04-20 NOTE — Patient Instructions (Addendum)
02/2020: At L4-5 there is a broad-based disc bulge. Severe bilateral facet arthropathy with ligamentum flavum infolding. Bilateral lateral recess narrowing. Mild bilateral foraminal stenosis. Mild grade 1 anterolisthesis of L4 on L5 secondary to facet disease.  Let me know if you don't get a call about PT.  Urinalysis when possible.

## 2022-04-21 DIAGNOSIS — R300 Dysuria: Secondary | ICD-10-CM | POA: Insufficient documentation

## 2022-04-21 DIAGNOSIS — R2689 Other abnormalities of gait and mobility: Secondary | ICD-10-CM | POA: Insufficient documentation

## 2022-04-21 NOTE — Assessment & Plan Note (Signed)
Refer to PT

## 2022-04-21 NOTE — Assessment & Plan Note (Signed)
Benign abdominal exam.  She can leave a urine specimen when possible.

## 2022-04-27 DIAGNOSIS — R3129 Other microscopic hematuria: Secondary | ICD-10-CM | POA: Diagnosis not present

## 2022-04-27 DIAGNOSIS — R319 Hematuria, unspecified: Secondary | ICD-10-CM | POA: Diagnosis not present

## 2022-04-27 DIAGNOSIS — R8290 Unspecified abnormal findings in urine: Secondary | ICD-10-CM | POA: Diagnosis not present

## 2022-05-03 DIAGNOSIS — R8290 Unspecified abnormal findings in urine: Secondary | ICD-10-CM | POA: Diagnosis not present

## 2022-05-03 DIAGNOSIS — R3129 Other microscopic hematuria: Secondary | ICD-10-CM | POA: Diagnosis not present

## 2022-06-01 DIAGNOSIS — Z87440 Personal history of urinary (tract) infections: Secondary | ICD-10-CM | POA: Diagnosis not present

## 2022-06-01 DIAGNOSIS — N200 Calculus of kidney: Secondary | ICD-10-CM | POA: Diagnosis not present

## 2022-06-01 DIAGNOSIS — N281 Cyst of kidney, acquired: Secondary | ICD-10-CM | POA: Diagnosis not present

## 2022-06-01 DIAGNOSIS — K760 Fatty (change of) liver, not elsewhere classified: Secondary | ICD-10-CM | POA: Diagnosis not present

## 2022-06-01 DIAGNOSIS — R3129 Other microscopic hematuria: Secondary | ICD-10-CM | POA: Diagnosis not present

## 2022-06-01 DIAGNOSIS — R8290 Unspecified abnormal findings in urine: Secondary | ICD-10-CM | POA: Diagnosis not present

## 2022-06-14 DIAGNOSIS — R8290 Unspecified abnormal findings in urine: Secondary | ICD-10-CM | POA: Diagnosis not present

## 2022-06-14 DIAGNOSIS — Z87440 Personal history of urinary (tract) infections: Secondary | ICD-10-CM | POA: Diagnosis not present

## 2022-06-14 DIAGNOSIS — N200 Calculus of kidney: Secondary | ICD-10-CM | POA: Diagnosis not present

## 2022-07-27 ENCOUNTER — Ambulatory Visit (HOSPITAL_BASED_OUTPATIENT_CLINIC_OR_DEPARTMENT_OTHER): Payer: Medicare Other | Attending: Family Medicine | Admitting: Physical Therapy

## 2022-07-27 DIAGNOSIS — R2689 Other abnormalities of gait and mobility: Secondary | ICD-10-CM | POA: Insufficient documentation

## 2022-07-27 NOTE — Therapy (Signed)
OUTPATIENT PHYSICAL THERAPY LOWER EXTREMITY EVALUATION   Patient Name: Patricia Hoover MRN: [id number] DOB:1945-07-24, 77 y.o., female Today's Date: 07/27/2022  END OF SESSION:  PT End of Session - 07/27/22 1441     Visit Number 1    Number of Visits 6    Date for PT Re-Evaluation 09/07/22    PT Start Time 1320    PT Stop Time 1405    PT Time Calculation (min) 45 min    Activity Tolerance Patient tolerated treatment well    Behavior During Therapy [facility] for tasks assessed/performed             Past Medical History:  Diagnosis Date   Arrhythmia    possible hx, resolved prev   Blood transfusion without reported diagnosis 1972   had reaction; had to stop   Carpal tunnel syndrome    Cataract 2019   resolved with surgery   Duodenal ulcer    H/O exercise stress test 2012   normal   Heart murmur    as child   High cholesterol    History of kidney stones    Hx of colonic polyps    Hypertension    Osteoporosis 2010   t score - 3.9   Renal stones    Past Surgical History:  Procedure Laterality Date   ABDOMINAL HYSTERECTOMY  1972   for endometriosis   CATARACT EXTRACTION W/ INTRAOCULAR LENS IMPLANT Right 06/2017   CATARACT EXTRACTION W/ INTRAOCULAR LENS IMPLANT Left 07/2017   COLONOSCOPY Left 03/02/2017   Procedure: COLONOSCOPY;  Surgeon: Pasty Spillers, MD;  Location: [facility] ENDOSCOPY;  Service: Endoscopy;  Laterality: Left;   COLONOSCOPY WITH PROPOFOL N/A 05/04/2016   Procedure: COLONOSCOPY WITH PROPOFOL;  Surgeon: Wyline Mood, MD;  Location: [facility] ENDOSCOPY;  Service: Endoscopy;  Laterality: N/A;   ESOPHAGOGASTRODUODENOSCOPY Left 03/02/2017   Procedure: ESOPHAGOGASTRODUODENOSCOPY (EGD);  Surgeon: Pasty Spillers, MD;  Location: [facility] ENDOSCOPY;  Service: Endoscopy;  Laterality: Left;   ESOPHAGOGASTRODUODENOSCOPY (EGD) WITH PROPOFOL N/A 04/08/2017   Procedure: ESOPHAGOGASTRODUODENOSCOPY (EGD) WITH PROPOFOL;  Surgeon: Pasty Spillers, MD;  Location: [facility] ENDOSCOPY;   Service: Endoscopy;  Laterality: N/A;   LITHOTRIPSY     for kidney stone x5   LITHOTRIPSY  09/2017   Patient Active Problem List   Diagnosis Date Noted   Dysuria 04/21/2022   Stiff-legged gait 04/21/2022   Scalp lesion 09/21/2021   Clavicle enlargement 04/05/2021   Hyperpigmentation 03/01/2021   Vitamin D deficiency 03/01/2021   Leg pain 05/21/2019   Right leg pain 05/21/2019   Healthcare maintenance 01/08/2018   Paresthesia 01/08/2018   Neck pain 08/31/2017   Angioedema 06/15/2017   Peptic ulcer of duodenum    Other specified diseases of esophagus    Hematochezia    External hemorrhoids    Diverticulosis of large intestine without diverticulitis    Internal hemorrhoids    Duodenal ulceration    Advance care planning 12/23/2016   Renal stone 04/20/2016   Heartburn 12/17/2015   Medicare annual wellness visit, subsequent 06/05/2013   Hyperglycemia 06/05/2013   Knee pain 08/31/2012   Osteoporosis 08/18/2012   HTN (hypertension) 08/09/2012   HLD (hyperlipidemia) 08/09/2012    PCP: Dr Crawford Givens   REFERRING PROVIDER: Dr Crawford Givens   REFERRING DIAG:  Diagnosis  R26.89 (ICD-10-CM) - Stiff-legged gait    THERAPY DIAG:  Other abnormalities of gait and mobility  Rationale for Evaluation and Treatment: Rehabilitation  ONSET DATE:   SUBJECTIVE:   SUBJECTIVE STATEMENT:   PERTINENT HISTORY:  Osteoporosis 2010 t score - 3.9  Carpal tunnel syndrome   PAIN:  Are you having pain? PAIN:  Are you having pain? Yes: NPRS scale: just a little sore to palpation/10 Pain location: middle of the lower back  Pain description: aching  Aggravating factors: sitting for a while  Relieving factors: rest      PRECAUTIONS: None  WEIGHT BEARING RESTRICTIONS: No  FALLS:  Has patient fallen in last 6 months? No  LIVING ENVIRONMENT: Has steps.   OCCUPATION:  Retired   Hobbies:   Health visitor   PLOF: Independent  PATIENT GOALS:  To be able to walk  better    NEXT MD VISIT:  Nothing scheduled   OBJECTIVE:   DIAGNOSTIC FINDINGS:  MRI: 1. At L4-5 there is a broad-based disc bulge. Severe bilateral facet arthropathy with ligamentum flavum infolding. Bilateral lateral recess narrowing. Mild bilateral foraminal stenosis. Mild grade 1 anterolisthesis of L4 on L5 secondary to facet disease. PATIENT SURVEYS:  FOTO    COGNITION: Overall cognitive status: Within functional limits for tasks assessed     SENSATION: Denies paresthesias   EDEMA:   MUSCLE LENGTH:  POSTURE: No Significant postural limitations  PALPATION: Tender to palpation in lower lumbar paraspinals   LOWER EXTREMITY ROM:  Active ROM Right eval Left eval  Hip flexion    Hip extension    Hip abduction    Hip adduction    Hip internal rotation    Hip external rotation    Knee flexion    Knee extension    Ankle dorsiflexion    Ankle plantarflexion    Ankle inversion    Ankle eversion     (Blank rows = not tested)  LUMBAR ROM:   Active  A/PROM  eval  Flexion   Extension   Right lateral flexion   Left lateral flexion   Right rotation   Left rotation    (Blank rows = not tested)   LOWER EXTREMITY MMT:  MMT Right eval Left eval  Hip flexion 20.9 12.9  Hip extension    Hip abduction 26.9 29.8  Hip adduction    Hip internal rotation    Hip external rotation    Knee flexion    Knee extension 22.8 18.3  Ankle dorsiflexion    Ankle plantarflexion    Ankle inversion    Ankle eversion     (Blank rows = not tested)    GAIT: Significant lateral shift to the right with ambulation.  Decreased bilateral knee bend with ambulation.  TODAY'S TREATMENT:                                                                                                                              DATE:  Exercises - Supine Piriformis Stretch with Foot on Ground  - 1 x daily - 7 x weekly - 3 sets - 3 reps - 20 sec  hold - Seated Bilateral Shoulder Flexion Towel  Slide at Table Top  - 1 x daily - 7 x weekly - 3 sets - 3 reps - 20 sec  hold - Seated Hamstring Stretch  - 1 x daily - 7 x weekly - 3 sets - 3 reps - 10=20 hold - Supine Bridge with Resistance Band  - 1 x daily - 7 x weekly - 3 sets - 10 reps - Supine March  - 1 x daily - 7 x weekly - 3 sets - 10 reps     PATIENT EDUCATION:  Education details: HEP, symptom management,  Person educated: Patient Education method: Explanation, Demonstration, Tactile cues, Verbal cues, and Handouts Education comprehension: verbalized understanding, returned demonstration, verbal cues required, tactile cues required, and needs further education  HOME EXERCISE PROGRAM: Access Code: [email or web address] URL: [email or web address] Date: 07/27/2022 Prepared by: Lorayne Bender  Exercises - Supine Piriformis Stretch with Foot on Ground  - 1 x daily - 7 x weekly - 3 sets - 3 reps - 20 sec  hold - Seated Bilateral Shoulder Flexion Towel Slide at Table Top  - 1 x daily - 7 x weekly - 3 sets - 3 reps - 20 sec  hold - Seated Hamstring Stretch  - 1 x daily - 7 x weekly - 3 sets - 3 reps - 10=20 hold - Supine Bridge with Resistance Band  - 1 x daily - 7 x weekly - 3 sets - 10 reps - Supine March  - 1 x daily - 7 x weekly - 3 sets - 10 reps  ASSESSMENT:  CLINICAL IMPRESSION: Patient is a 77 year old female who presents to physical therapy with increased stiffness when she initially stands from a sitting position.  She feels tightness in her lower back and posterior legs.  She has no significant back pain but she does have tenderness to palpation in lumbar paraspinals.  She presents with weakness on the left side compared to right in her hip flexor and knee extensors.  She would benefit from skilled physical therapy to improve her ability to transfer and walk. OBJECTIVE IMPAIRMENTS: Abnormal gait, decreased activity tolerance, difficulty walking, decreased ROM, decreased strength, and pain.   ACTIVITY LIMITATIONS:  sitting, standing, squatting, stairs, and locomotion level  PARTICIPATION LIMITATIONS: meal prep, cleaning, laundry, shopping, community activity, and yard work  PERSONAL FACTORS: 1-2 comorbidities: osteopetrosis  are also affecting patient's functional outcome.   REHAB POTENTIAL: Good  CLINICAL DECISION MAKING: Evolving/moderate complexity increased difficulty transferring from sit to stand and then ambulating  EVALUATION COMPLEXITY: Moderate   GOALS: Goals reviewed with patient? Yes  SHORT TERM GOALS: Target date: 08/17/2022   Patient will increase bilateral lower extremity strength by 5 pounds Baseline: Goal status: INITIAL  2.  Patient will report a 50% reduction in stiffness with transfer from sit to stand Baseline:  Goal status: INITIAL  3.  Patient will be independent with HEP Baseline:  Goal status: INITIAL  LONG TERM GOALS: Target date: 09/07/2022    The patient will go up and down 6 steps with reciprocal pattern  Baseline:  Goal status: INITIAL  2.  Patient will stand and walk without stiffness  Baseline:  Goal status: INITIAL  3.  Patient will be independent with complete HEP Baseline:  Goal status: INITIAL   PLAN:  PT FREQUENCY: 1-2x/week  PT DURATION: 6 weeks  PLANNED INTERVENTIONS: Therapeutic exercises, Therapeutic activity, Neuromuscular re-education, Balance training, Gait training, Patient/Family education, Self Care, Joint mobilization, Stair training, DME instructions, Aquatic Therapy, Dry Needling, Cryotherapy, Moist heat, and Manual  therapy  PLAN FOR NEXT SESSION: Patient has a gym membership but reports she just goes for line dancing.  Patient given stretches previous visit.  Next visit focus on expanding supine exercises.  Add sit to stand transfers.  Review tolerance to stretching and HEP.  Continue with core stabilization exercises as tolerated.  Consider standing series of core stabilization as it may be easier for her to do at home  rather than having to lie down.   Dessie Coma, PT 07/27/2022, 2:45 PM

## 2022-07-28 NOTE — Progress Notes (Signed)
Physician Documentation  Your signature is required to indicate approval of the treatment plan as stated above. By signing this report, you are approving the plan of care. Please sign and either send electronically or print and fax the signed copy to the number below. If you approve with modifications, please indicate those in the space provided. Physician Signature: Crawford Givens Date:_07/10/24 Time:__10:35 AM

## 2022-08-05 ENCOUNTER — Other Ambulatory Visit: Payer: Self-pay | Admitting: Family Medicine

## 2022-08-05 DIAGNOSIS — Z1231 Encounter for screening mammogram for malignant neoplasm of breast: Secondary | ICD-10-CM

## 2022-08-06 ENCOUNTER — Ambulatory Visit
Admission: RE | Admit: 2022-08-06 | Discharge: 2022-08-06 | Disposition: A | Discharge status: Home / Self Care | Payer: Medicare Other | Source: Ambulatory Visit | Attending: Family Medicine | Admitting: Family Medicine

## 2022-08-06 DIAGNOSIS — Z1231 Encounter for screening mammogram for malignant neoplasm of breast: Secondary | ICD-10-CM | POA: Diagnosis not present

## 2022-08-10 ENCOUNTER — Other Ambulatory Visit: Payer: Self-pay

## 2022-08-12 ENCOUNTER — Encounter (HOSPITAL_BASED_OUTPATIENT_CLINIC_OR_DEPARTMENT_OTHER): Payer: Self-pay

## 2022-08-12 ENCOUNTER — Ambulatory Visit (HOSPITAL_BASED_OUTPATIENT_CLINIC_OR_DEPARTMENT_OTHER): Payer: Medicare Other

## 2022-08-12 DIAGNOSIS — R2689 Other abnormalities of gait and mobility: Secondary | ICD-10-CM | POA: Diagnosis not present

## 2022-08-12 NOTE — Therapy (Signed)
OUTPATIENT PHYSICAL THERAPY LOWER EXTREMITY EVALUATION   Patient Name: Patricia Hoover MRN: [id number] DOB:[birth year], 77 y.o., female Today's Date: 08/12/2022  END OF SESSION:  PT End of Session - 08/12/22 1331     Visit Number 2    Number of Visits 6    Date for PT Re-Evaluation 09/07/22    PT Start Time 1330    PT Stop Time 1415    PT Time Calculation (min) 45 min    Activity Tolerance Patient tolerated treatment well    Behavior During Therapy [facility] for tasks assessed/performed              Past Medical History:  Diagnosis Date   Arrhythmia    possible hx, resolved prev   Blood transfusion without reported diagnosis 1972   had reaction; had to stop   Carpal tunnel syndrome    Cataract 2019   resolved with surgery   Duodenal ulcer    H/O exercise stress test 2012   normal   Heart murmur    as child   High cholesterol    History of kidney stones    Hx of colonic polyps    Hypertension    Osteoporosis 2010   t score - 3.9   Renal stones    Past Surgical History:  Procedure Laterality Date   ABDOMINAL HYSTERECTOMY  1972   for endometriosis   CATARACT EXTRACTION W/ INTRAOCULAR LENS IMPLANT Right 06/2017   CATARACT EXTRACTION W/ INTRAOCULAR LENS IMPLANT Left 07/2017   COLONOSCOPY Left 03/02/2017   Procedure: COLONOSCOPY;  Surgeon: Pasty Spillers, MD;  Location: [facility] ENDOSCOPY;  Service: Endoscopy;  Laterality: Left;   COLONOSCOPY WITH PROPOFOL N/A 05/04/2016   Procedure: COLONOSCOPY WITH PROPOFOL;  Surgeon: Wyline Mood, MD;  Location: [facility] ENDOSCOPY;  Service: Endoscopy;  Laterality: N/A;   ESOPHAGOGASTRODUODENOSCOPY Left 03/02/2017   Procedure: ESOPHAGOGASTRODUODENOSCOPY (EGD);  Surgeon: Pasty Spillers, MD;  Location: [facility] ENDOSCOPY;  Service: Endoscopy;  Laterality: Left;   ESOPHAGOGASTRODUODENOSCOPY (EGD) WITH PROPOFOL N/A 04/08/2017   Procedure: ESOPHAGOGASTRODUODENOSCOPY (EGD) WITH PROPOFOL;  Surgeon: Pasty Spillers, MD;  Location: [facility] ENDOSCOPY;   Service: Endoscopy;  Laterality: N/A;   LITHOTRIPSY     for kidney stone x5   LITHOTRIPSY  09/2017   Patient Active Problem List   Diagnosis Date Noted   Dysuria 04/21/2022   Stiff-legged gait 04/21/2022   Scalp lesion 09/21/2021   Clavicle enlargement 04/05/2021   Hyperpigmentation 03/01/2021   Vitamin D deficiency 03/01/2021   Leg pain 05/21/2019   Right leg pain 05/21/2019   Healthcare maintenance 01/08/2018   Paresthesia 01/08/2018   Neck pain 08/31/2017   Angioedema 06/15/2017   Peptic ulcer of duodenum    Other specified diseases of esophagus    Hematochezia    External hemorrhoids    Diverticulosis of large intestine without diverticulitis    Internal hemorrhoids    Duodenal ulceration    Advance care planning 12/23/2016   Renal stone 04/20/2016   Heartburn 12/17/2015   Medicare annual wellness visit, subsequent 06/05/2013   Hyperglycemia 06/05/2013   Knee pain 08/31/2012   Osteoporosis 08/18/2012   HTN (hypertension) 08/09/2012   HLD (hyperlipidemia) 08/09/2012    PCP: Dr Crawford Givens   REFERRING PROVIDER: Dr Crawford Givens   REFERRING DIAG:  Diagnosis  R26.89 (ICD-10-CM) - Stiff-legged gait    THERAPY DIAG:  Other abnormalities of gait and mobility  Rationale for Evaluation and Treatment: Rehabilitation  ONSET DATE:   SUBJECTIVE:   SUBJECTIVE STATEMENT:   PERTINENT  HISTORY: Osteoporosis 2010 t score - 3.9  Carpal tunnel syndrome   PAIN:  Are you having pain? PAIN:  Are you having pain? Yes: NPRS scale: just a little sore to palpation/10 Pain location: middle of the lower back  Pain description: aching  Aggravating factors: sitting for a while  Relieving factors: rest      PRECAUTIONS: None  WEIGHT BEARING RESTRICTIONS: No  FALLS:  Has patient fallen in last 6 months? No  LIVING ENVIRONMENT: Has steps.   OCCUPATION:  Retired   Hobbies:   Health visitor   PLOF: Independent  PATIENT GOALS:  To be able to walk  better    NEXT MD VISIT:  Nothing scheduled   OBJECTIVE:   DIAGNOSTIC FINDINGS:  MRI: 1. At L4-5 there is a broad-based disc bulge. Severe bilateral facet arthropathy with ligamentum flavum infolding. Bilateral lateral recess narrowing. Mild bilateral foraminal stenosis. Mild grade 1 anterolisthesis of L4 on L5 secondary to facet disease. PATIENT SURVEYS:  FOTO    COGNITION: Overall cognitive status: Within functional limits for tasks assessed     SENSATION: Denies paresthesias   EDEMA:   MUSCLE LENGTH:  POSTURE: No Significant postural limitations  PALPATION: Tender to palpation in lower lumbar paraspinals   LOWER EXTREMITY ROM:  Active ROM Right eval Left eval  Hip flexion    Hip extension    Hip abduction    Hip adduction    Hip internal rotation    Hip external rotation    Knee flexion    Knee extension    Ankle dorsiflexion    Ankle plantarflexion    Ankle inversion    Ankle eversion     (Blank rows = not tested)  LUMBAR ROM:   Active  A/PROM  eval  Flexion   Extension   Right lateral flexion   Left lateral flexion   Right rotation   Left rotation    (Blank rows = not tested)   LOWER EXTREMITY MMT:  MMT Right eval Left eval  Hip flexion 20.9 12.9  Hip extension    Hip abduction 26.9 29.8  Hip adduction    Hip internal rotation    Hip external rotation    Knee flexion    Knee extension 22.8 18.3  Ankle dorsiflexion    Ankle plantarflexion    Ankle inversion    Ankle eversion     (Blank rows = not tested)    GAIT: Significant lateral shift to the right with ambulation.  Decreased bilateral knee bend with ambulation.  TODAY'S TREATMENT:                                                                                                                              DATE:   7/25:  Nu-step L4 (UE/LE)  Seated HSS- 30sec x2 ea (heel on 6" step) Sit to stands 2x5 with TrA cues Side stepping at rail x3 laps Marching 2# cuff  weights  2x10 Modified tandem stance- 20sec x2  Gait training in hall  HR 3x10 Standing hip abd/ext with 2# 2x10ea Partial squats x10  Eval:  Exercises - Supine Piriformis Stretch with Foot on Ground  - 1 x daily - 7 x weekly - 3 sets - 3 reps - 20 sec  hold - Seated Bilateral Shoulder Flexion Towel Slide at Table Top  - 1 x daily - 7 x weekly - 3 sets - 3 reps - 20 sec  hold - Seated Hamstring Stretch  - 1 x daily - 7 x weekly - 3 sets - 3 reps - 10=20 hold - Supine Bridge with Resistance Band  - 1 x daily - 7 x weekly - 3 sets - 10 reps - Supine March  - 1 x daily - 7 x weekly - 3 sets - 10 reps     PATIENT EDUCATION:  Education details: HEP, symptom management,  Person educated: Patient Education method: Explanation, Demonstration, Tactile cues, Verbal cues, and Handouts Education comprehension: verbalized understanding, returned demonstration, verbal cues required, tactile cues required, and needs further education  HOME EXERCISE PROGRAM: Access Code: [email or web address] URL: [email or web address] Date: 07/27/2022 Prepared by: Lorayne Bender  Exercises - Supine Piriformis Stretch with Foot on Ground  - 1 x daily - 7 x weekly - 3 sets - 3 reps - 20 sec  hold - Seated Bilateral Shoulder Flexion Towel Slide at Table Top  - 1 x daily - 7 x weekly - 3 sets - 3 reps - 20 sec  hold - Seated Hamstring Stretch  - 1 x daily - 7 x weekly - 3 sets - 3 reps - 10=20 hold - Supine Bridge with Resistance Band  - 1 x daily - 7 x weekly - 3 sets - 10 reps - Supine March  - 1 x daily - 7 x weekly - 3 sets - 10 reps  ASSESSMENT:  CLINICAL IMPRESSION: Reviewed HEP with pt to answer questions regarding frequency of exercises. Trialed nu-step for good tolerance. Pt reports she has access to a recumbent bike at home and at the gym, so instructed her to try this for 5-10 minutes at a time.  With ambulation, she demonstrates unsteadiness and veers laterally at times. Unable to maintain tandem  stance without UE support. Demonstrates genu recurvatum bilaterally in standing. Will monitor this. Will continue to progress as tolerated. Will update HEP next visit as appropriate.    OBJECTIVE IMPAIRMENTS: Abnormal gait, decreased activity tolerance, difficulty walking, decreased ROM, decreased strength, and pain.   ACTIVITY LIMITATIONS: sitting, standing, squatting, stairs, and locomotion level  PARTICIPATION LIMITATIONS: meal prep, cleaning, laundry, shopping, community activity, and yard work  PERSONAL FACTORS: 1-2 comorbidities: osteopetrosis  are also affecting patient's functional outcome.   REHAB POTENTIAL: Good  CLINICAL DECISION MAKING: Evolving/moderate complexity increased difficulty transferring from sit to stand and then ambulating  EVALUATION COMPLEXITY: Moderate   GOALS: Goals reviewed with patient? Yes  SHORT TERM GOALS: Target date: 08/17/2022   Patient will increase bilateral lower extremity strength by 5 pounds Baseline: Goal status: INITIAL  2.  Patient will report a 50% reduction in stiffness with transfer from sit to stand Baseline:  Goal status: INITIAL  3.  Patient will be independent with HEP Baseline:  Goal status: INITIAL  LONG TERM GOALS: Target date: 09/07/2022    The patient will go up and down 6 steps with reciprocal pattern  Baseline:  Goal status: INITIAL  2.  Patient will stand and walk without stiffness  Baseline:  Goal status: INITIAL  3.  Patient will be independent with complete HEP Baseline:  Goal status: INITIAL   PLAN:  PT FREQUENCY: 1-2x/week  PT DURATION: 6 weeks  PLANNED INTERVENTIONS: Therapeutic exercises, Therapeutic activity, Neuromuscular re-education, Balance training, Gait training, Patient/Family education, Self Care, Joint mobilization, Stair training, DME instructions, Aquatic Therapy, Dry Needling, Cryotherapy, Moist heat, and Manual therapy  PLAN FOR NEXT SESSION: Patient has a gym membership but  reports she just goes for line dancing.  Patient given stretches previous visit.  Next visit focus on expanding supine exercises.  Add sit to stand transfers.  Review tolerance to stretching and HEP.  Continue with core stabilization exercises as tolerated.  Consider standing series of core stabilization as it may be easier for her to do at home rather than having to lie down.   Donnel Saxon Dontrey Snellgrove, PTA 08/12/2022, 2:29 PM

## 2022-08-19 ENCOUNTER — Encounter (HOSPITAL_BASED_OUTPATIENT_CLINIC_OR_DEPARTMENT_OTHER): Payer: Medicare Other | Admitting: Physical Therapy

## 2022-08-26 ENCOUNTER — Encounter (HOSPITAL_BASED_OUTPATIENT_CLINIC_OR_DEPARTMENT_OTHER): Payer: Medicare Other | Admitting: Physical Therapy

## 2022-09-02 ENCOUNTER — Ambulatory Visit (HOSPITAL_BASED_OUTPATIENT_CLINIC_OR_DEPARTMENT_OTHER): Payer: Medicare Other | Attending: Family Medicine

## 2022-09-02 ENCOUNTER — Encounter (HOSPITAL_BASED_OUTPATIENT_CLINIC_OR_DEPARTMENT_OTHER): Payer: Self-pay

## 2022-09-02 DIAGNOSIS — R2689 Other abnormalities of gait and mobility: Secondary | ICD-10-CM | POA: Insufficient documentation

## 2022-09-02 NOTE — Therapy (Signed)
OUTPATIENT PHYSICAL THERAPY LOWER EXTREMITY TREATMENT   Patient Name: Patricia Hoover MRN: [id number] DOB:02/27/1945, 77 y.o., female Today's Date: 09/02/2022  END OF SESSION:  PT End of Session - 09/02/22 1436     Visit Number 3    Number of Visits 6    Date for PT Re-Evaluation 09/07/22    PT Start Time 1434    PT Stop Time 1515    PT Time Calculation (min) 41 min    Activity Tolerance Patient tolerated treatment well    Behavior During Therapy [facility] for tasks assessed/performed               Past Medical History:  Diagnosis Date   Arrhythmia    possible hx, resolved prev   Blood transfusion without reported diagnosis 1972   had reaction; had to stop   Carpal tunnel syndrome    Cataract 2019   resolved with surgery   Duodenal ulcer    H/O exercise stress test 2012   normal   Heart murmur    as child   High cholesterol    History of kidney stones    Hx of colonic polyps    Hypertension    Osteoporosis 2010   t score - 3.9   Renal stones    Past Surgical History:  Procedure Laterality Date   ABDOMINAL HYSTERECTOMY  1972   for endometriosis   CATARACT EXTRACTION W/ INTRAOCULAR LENS IMPLANT Right 06/2017   CATARACT EXTRACTION W/ INTRAOCULAR LENS IMPLANT Left 07/2017   COLONOSCOPY Left 03/02/2017   Procedure: COLONOSCOPY;  Surgeon: Pasty Spillers, MD;  Location: [facility] ENDOSCOPY;  Service: Endoscopy;  Laterality: Left;   COLONOSCOPY WITH PROPOFOL N/A 05/04/2016   Procedure: COLONOSCOPY WITH PROPOFOL;  Surgeon: Wyline Mood, MD;  Location: [facility] ENDOSCOPY;  Service: Endoscopy;  Laterality: N/A;   ESOPHAGOGASTRODUODENOSCOPY Left 03/02/2017   Procedure: ESOPHAGOGASTRODUODENOSCOPY (EGD);  Surgeon: Pasty Spillers, MD;  Location: [facility] ENDOSCOPY;  Service: Endoscopy;  Laterality: Left;   ESOPHAGOGASTRODUODENOSCOPY (EGD) WITH PROPOFOL N/A 04/08/2017   Procedure: ESOPHAGOGASTRODUODENOSCOPY (EGD) WITH PROPOFOL;  Surgeon: Pasty Spillers, MD;  Location: [facility]  ENDOSCOPY;  Service: Endoscopy;  Laterality: N/A;   LITHOTRIPSY     for kidney stone x5   LITHOTRIPSY  09/2017   Patient Active Problem List   Diagnosis Date Noted   Dysuria 04/21/2022   Stiff-legged gait 04/21/2022   Scalp lesion 09/21/2021   Clavicle enlargement 04/05/2021   Hyperpigmentation 03/01/2021   Vitamin D deficiency 03/01/2021   Leg pain 05/21/2019   Right leg pain 05/21/2019   Healthcare maintenance 01/08/2018   Paresthesia 01/08/2018   Neck pain 08/31/2017   Angioedema 06/15/2017   Peptic ulcer of duodenum    Other specified diseases of esophagus    Hematochezia    External hemorrhoids    Diverticulosis of large intestine without diverticulitis    Internal hemorrhoids    Duodenal ulceration    Advance care planning 12/23/2016   Renal stone 04/20/2016   Heartburn 12/17/2015   Medicare annual wellness visit, subsequent 06/05/2013   Hyperglycemia 06/05/2013   Knee pain 08/31/2012   Osteoporosis 08/18/2012   HTN (hypertension) 08/09/2012   HLD (hyperlipidemia) 08/09/2012    PCP: Dr Crawford Givens   REFERRING PROVIDER: Dr Crawford Givens   REFERRING DIAG:  Diagnosis  R26.89 (ICD-10-CM) - Stiff-legged gait    THERAPY DIAG:  Other abnormalities of gait and mobility  Rationale for Evaluation and Treatment: Rehabilitation  ONSET DATE:   SUBJECTIVE:   SUBJECTIVE STATEMENT: Pt reports  she can get up out of chairs better. No pain, only stiffness in posterior knees/calves  PERTINENT HISTORY: Osteoporosis 2010 t score - 3.9  Carpal tunnel syndrome   PAIN:  Are you having pain? PAIN:  Are you having pain? No   PRECAUTIONS: None  WEIGHT BEARING RESTRICTIONS: No  FALLS:  Has patient fallen in last 6 months? No  LIVING ENVIRONMENT: Has steps.   OCCUPATION:  Retired   Hobbies:   Health visitor   PLOF: Independent  PATIENT GOALS:  To be able to walk better    NEXT MD VISIT:  Nothing scheduled   OBJECTIVE:   DIAGNOSTIC  FINDINGS:  MRI: 1. At L4-5 there is a broad-based disc bulge. Severe bilateral facet arthropathy with ligamentum flavum infolding. Bilateral lateral recess narrowing. Mild bilateral foraminal stenosis. Mild grade 1 anterolisthesis of L4 on L5 secondary to facet disease. PATIENT SURVEYS:  FOTO    COGNITION: Overall cognitive status: Within functional limits for tasks assessed     SENSATION: Denies paresthesias   EDEMA:   MUSCLE LENGTH:  POSTURE: No Significant postural limitations  PALPATION: Tender to palpation in lower lumbar paraspinals   LOWER EXTREMITY ROM:  Active ROM Right eval Left eval  Hip flexion    Hip extension    Hip abduction    Hip adduction    Hip internal rotation    Hip external rotation    Knee flexion    Knee extension    Ankle dorsiflexion    Ankle plantarflexion    Ankle inversion    Ankle eversion     (Blank rows = not tested)  LUMBAR ROM:   Active  A/PROM  eval  Flexion   Extension   Right lateral flexion   Left lateral flexion   Right rotation   Left rotation    (Blank rows = not tested)   LOWER EXTREMITY MMT:  MMT Right eval Left eval  Hip flexion 20.9 12.9  Hip extension    Hip abduction 26.9 29.8  Hip adduction    Hip internal rotation    Hip external rotation    Knee flexion    Knee extension 22.8 18.3  Ankle dorsiflexion    Ankle plantarflexion    Ankle inversion    Ankle eversion     (Blank rows = not tested)    GAIT: Significant lateral shift to the right with ambulation.  Decreased bilateral knee bend with ambulation.  TODAY'S TREATMENT:                                                                                                                              DATE:    8/15  Nu-step L4 (UE/LE)  Seated HSS- 30sec x3 ea (heel on 6" step) Sit to stands 2x10 Side stepping at rail with RTB  x2 laps Modified tandem stance- 30sec x2   Marching in hall -1/2 hall x2 (SBA) HR 3x10 Step ups fwd 6"  2x10L Lateral step ups 4" 2x10 L    7/25:  Nu-step L4 (UE/LE)  Seated HSS- 30sec x2 ea (heel on 6" step) Sit to stands 2x5 with TrA cues Side stepping at rail x3 laps Marching 2# cuff weights 2x10 Modified tandem stance- 20sec x2  Gait training in hall  HR 3x10 Standing hip abd/ext with 2# 2x10ea Partial squats x10  Eval:  Exercises - Supine Piriformis Stretch with Foot on Ground  - 1 x daily - 7 x weekly - 3 sets - 3 reps - 20 sec  hold - Seated Bilateral Shoulder Flexion Towel Slide at Table Top  - 1 x daily - 7 x weekly - 3 sets - 3 reps - 20 sec  hold - Seated Hamstring Stretch  - 1 x daily - 7 x weekly - 3 sets - 3 reps - 10=20 hold - Supine Bridge with Resistance Band  - 1 x daily - 7 x weekly - 3 sets - 10 reps - Supine March  - 1 x daily - 7 x weekly - 3 sets - 10 reps     PATIENT EDUCATION:  Education details: HEP, symptom management,  Person educated: Patient Education method: Explanation, Demonstration, Tactile cues, Verbal cues, and Handouts Education comprehension: verbalized understanding, returned demonstration, verbal cues required, tactile cues required, and needs further education  HOME EXERCISE PROGRAM: Access Code: [email or web address] URL: [email or web address] Date: 07/27/2022 Prepared by: Lorayne Bender  Exercises - Supine Piriformis Stretch with Foot on Ground  - 1 x daily - 7 x weekly - 3 sets - 3 reps - 20 sec  hold - Seated Bilateral Shoulder Flexion Towel Slide at Table Top  - 1 x daily - 7 x weekly - 3 sets - 3 reps - 20 sec  hold - Seated Hamstring Stretch  - 1 x daily - 7 x weekly - 3 sets - 3 reps - 10=20 hold - Supine Bridge with Resistance Band  - 1 x daily - 7 x weekly - 3 sets - 10 reps - Supine March  - 1 x daily - 7 x weekly - 3 sets - 10 reps  ASSESSMENT:  CLINICAL IMPRESSION: Pt able to complete increased repetitions with sit to stands today and reported improved ability for this with decreased stiffness by end of  second set. She reports having difficulty with stair climbing, so worked on step ups today. She demonstrates greater difficulty leading with L LE and requires cues for knee flexion when descending back to floor. She has tendency to lock out L knee and avoid flexing knee with this. This did improve following cues. Updated HEP to include resisted side stepping and fwd step ups at her stairs.     OBJECTIVE IMPAIRMENTS: Abnormal gait, decreased activity tolerance, difficulty walking, decreased ROM, decreased strength, and pain.   ACTIVITY LIMITATIONS: sitting, standing, squatting, stairs, and locomotion level  PARTICIPATION LIMITATIONS: meal prep, cleaning, laundry, shopping, community activity, and yard work  PERSONAL FACTORS: 1-2 comorbidities: osteopetrosis  are also affecting patient's functional outcome.   REHAB POTENTIAL: Good  CLINICAL DECISION MAKING: Evolving/moderate complexity increased difficulty transferring from sit to stand and then ambulating  EVALUATION COMPLEXITY: Moderate   GOALS: Goals reviewed with patient? Yes  SHORT TERM GOALS: Target date: 08/17/2022   Patient will increase bilateral lower extremity strength by 5 pounds Baseline: Goal status: INITIAL  2.  Patient will report a 50% reduction in stiffness with transfer from sit to stand Baseline:  Goal status: IN PROGRESS 8/15  3.  Patient will be independent with HEP Baseline:  Goal status: IN PROGRESS 8/15 (was out of town and did not do exercises)  LONG TERM GOALS: Target date: 09/07/2022    The patient will go up and down 6 steps with reciprocal pattern  Baseline:  Goal status: INITIAL  2.  Patient will stand and walk without stiffness  Baseline:  Goal status: INITIAL  3.  Patient will be independent with complete HEP Baseline:  Goal status: INITIAL   PLAN:  PT FREQUENCY: 1-2x/week  PT DURATION: 6 weeks  PLANNED INTERVENTIONS: Therapeutic exercises, Therapeutic activity, Neuromuscular  re-education, Balance training, Gait training, Patient/Family education, Self Care, Joint mobilization, Stair training, DME instructions, Aquatic Therapy, Dry Needling, Cryotherapy, Moist heat, and Manual therapy  PLAN FOR NEXT SESSION: Patient has a gym membership but reports she just goes for line dancing.  Patient given stretches previous visit.  Next visit focus on expanding supine exercises.  Add sit to stand transfers.  Review tolerance to stretching and HEP.  Continue with core stabilization exercises as tolerated.  Consider standing series of core stabilization as it may be easier for her to do at home rather than having to lie down.   Donnel Saxon , PTA 09/02/2022, 3:41 PM

## 2022-09-07 ENCOUNTER — Other Ambulatory Visit: Payer: Self-pay

## 2022-09-07 ENCOUNTER — Encounter (HOSPITAL_BASED_OUTPATIENT_CLINIC_OR_DEPARTMENT_OTHER): Payer: Self-pay | Admitting: Physical Therapy

## 2022-09-07 ENCOUNTER — Ambulatory Visit (HOSPITAL_BASED_OUTPATIENT_CLINIC_OR_DEPARTMENT_OTHER): Payer: Medicare Other | Admitting: Physical Therapy

## 2022-09-07 DIAGNOSIS — R2689 Other abnormalities of gait and mobility: Secondary | ICD-10-CM

## 2022-09-07 NOTE — Therapy (Signed)
OUTPATIENT PHYSICAL THERAPY LOWER EXTREMITY TREATMENT/    Patient Name: Patricia Hoover MRN: [id number] DOB:24-Nov-1945, 77 y.o., female Today's Date: 09/07/2022  END OF SESSION:  PT End of Session - 09/07/22 0806     Visit Number 4    Number of Visits 10    Date for PT Re-Evaluation 10/19/22    PT Start Time 0800    PT Stop Time 0843    PT Time Calculation (min) 43 min    Activity Tolerance Patient tolerated treatment well    Behavior During Therapy [facility] for tasks assessed/performed                Past Medical History:  Diagnosis Date   Arrhythmia    possible hx, resolved prev   Blood transfusion without reported diagnosis 1972   had reaction; had to stop   Carpal tunnel syndrome    Cataract 2019   resolved with surgery   Duodenal ulcer    H/O exercise stress test 2012   normal   Heart murmur    as child   High cholesterol    History of kidney stones    Hx of colonic polyps    Hypertension    Osteoporosis 2010   t score - 3.9   Renal stones    Past Surgical History:  Procedure Laterality Date   ABDOMINAL HYSTERECTOMY  1972   for endometriosis   CATARACT EXTRACTION W/ INTRAOCULAR LENS IMPLANT Right 06/2017   CATARACT EXTRACTION W/ INTRAOCULAR LENS IMPLANT Left 07/2017   COLONOSCOPY Left 03/02/2017   Procedure: COLONOSCOPY;  Surgeon: Pasty Spillers, MD;  Location: [facility] ENDOSCOPY;  Service: Endoscopy;  Laterality: Left;   COLONOSCOPY WITH PROPOFOL N/A 05/04/2016   Procedure: COLONOSCOPY WITH PROPOFOL;  Surgeon: Wyline Mood, MD;  Location: [facility] ENDOSCOPY;  Service: Endoscopy;  Laterality: N/A;   ESOPHAGOGASTRODUODENOSCOPY Left 03/02/2017   Procedure: ESOPHAGOGASTRODUODENOSCOPY (EGD);  Surgeon: Pasty Spillers, MD;  Location: [facility] ENDOSCOPY;  Service: Endoscopy;  Laterality: Left;   ESOPHAGOGASTRODUODENOSCOPY (EGD) WITH PROPOFOL N/A 04/08/2017   Procedure: ESOPHAGOGASTRODUODENOSCOPY (EGD) WITH PROPOFOL;  Surgeon: Pasty Spillers, MD;  Location: [facility]  ENDOSCOPY;  Service: Endoscopy;  Laterality: N/A;   LITHOTRIPSY     for kidney stone x5   LITHOTRIPSY  09/2017   Patient Active Problem List   Diagnosis Date Noted   Dysuria 04/21/2022   Stiff-legged gait 04/21/2022   Scalp lesion 09/21/2021   Clavicle enlargement 04/05/2021   Hyperpigmentation 03/01/2021   Vitamin D deficiency 03/01/2021   Leg pain 05/21/2019   Right leg pain 05/21/2019   Healthcare maintenance 01/08/2018   Paresthesia 01/08/2018   Neck pain 08/31/2017   Angioedema 06/15/2017   Peptic ulcer of duodenum    Other specified diseases of esophagus    Hematochezia    External hemorrhoids    Diverticulosis of large intestine without diverticulitis    Internal hemorrhoids    Duodenal ulceration    Advance care planning 12/23/2016   Renal stone 04/20/2016   Heartburn 12/17/2015   Medicare annual wellness visit, subsequent 06/05/2013   Hyperglycemia 06/05/2013   Knee pain 08/31/2012   Osteoporosis 08/18/2012   HTN (hypertension) 08/09/2012   HLD (hyperlipidemia) 08/09/2012    PCP: Dr Crawford Givens   REFERRING PROVIDER: Dr Crawford Givens   REFERRING DIAG:  Diagnosis  R26.89 (ICD-10-CM) - Stiff-legged gait    THERAPY DIAG:  Other abnormalities of gait and mobility  Rationale for Evaluation and Treatment: Rehabilitation  ONSET DATE:   SUBJECTIVE:   SUBJECTIVE STATEMENT:  The patient reports a progressive improvement in soreness.   PERTINENT HISTORY: Osteoporosis 2010 t score - 3.9  Carpal tunnel syndrome   PAIN:  Are you having pain? PAIN:  Are you having pain? No   PRECAUTIONS: None  WEIGHT BEARING RESTRICTIONS: No  FALLS:  Has patient fallen in last 6 months? No  LIVING ENVIRONMENT: Has steps.   OCCUPATION:  Retired   Hobbies:   Health visitor   PLOF: Independent  PATIENT GOALS:  To be able to walk better    NEXT MD VISIT:  Nothing scheduled   OBJECTIVE:   DIAGNOSTIC FINDINGS:  MRI: 1. At L4-5 there is a  broad-based disc bulge. Severe bilateral facet arthropathy with ligamentum flavum infolding. Bilateral lateral recess narrowing. Mild bilateral foraminal stenosis. Mild grade 1 anterolisthesis of L4 on L5 secondary to facet disease. PATIENT SURVEYS:  FOTO    COGNITION: Overall cognitive status: Within functional limits for tasks assessed     SENSATION: Denies paresthesias   EDEMA:   MUSCLE LENGTH:  POSTURE: No Significant postural limitations  PALPATION: Tender to palpation in lower lumbar paraspinals   LOWER EXTREMITY ROM:  Active ROM Right eval Left eval  Hip flexion    Hip extension    Hip abduction    Hip adduction    Hip internal rotation    Hip external rotation    Knee flexion    Knee extension    Ankle dorsiflexion    Ankle plantarflexion    Ankle inversion    Ankle eversion     (Blank rows = not tested)  LUMBAR ROM:   Active  A/PROM  eval  Flexion   Extension   Right lateral flexion   Left lateral flexion   Right rotation   Left rotation    (Blank rows = not tested)   LOWER EXTREMITY MMT:  MMT Right eval Left eval Right  8/20 Left  8/20  Hip flexion 20.9 12.9 17.2 17.0  Hip extension      Hip abduction 26.9 29.8 24.5 28.1  Hip adduction      Hip internal rotation      Hip external rotation      Knee flexion      Knee extension 22.8 18.3 29.8 22.9  Ankle dorsiflexion      Ankle plantarflexion      Ankle inversion      Ankle eversion       (Blank rows = not tested)    GAIT: Significant lateral shift to the right with ambulation.  Decreased bilateral knee bend with ambulation.  TODAY'S TREATMENT:                                                                                                                              DATE:  8/20 Nu-step L4 (UE/LE)  LTR x15  Gluteal stretch 2x20 sec hold bilateral   Bridge 2 x 10  Sit to stand 2 x  10  Leg press 3 x 10 30 pounds reviewed proper set up and use of the machine  Hip  adduction machine 3 x 1025 pounds reviewed proper set up of the machine     8/15  Nu-step L4 (UE/LE)  Seated HSS- 30sec x3 ea (heel on 6" step) Sit to stands 2x10 Side stepping at rail with RTB  x2 laps Modified tandem stance- 30sec x2   Marching in hall -1/2 hall x2 (SBA) HR 3x10 Step ups fwd 6" 2x10L Lateral step ups 4" 2x10 L    7/25:  Nu-step L4 (UE/LE)  Seated HSS- 30sec x2 ea (heel on 6" step) Sit to stands 2x5 with TrA cues Side stepping at rail x3 laps Marching 2# cuff weights 2x10 Modified tandem stance- 20sec x2  Gait training in hall  HR 3x10 Standing hip abd/ext with 2# 2x10ea Partial squats x10  Eval:  Exercises - Supine Piriformis Stretch with Foot on Ground  - 1 x daily - 7 x weekly - 3 sets - 3 reps - 20 sec  hold - Seated Bilateral Shoulder Flexion Towel Slide at Table Top  - 1 x daily - 7 x weekly - 3 sets - 3 reps - 20 sec  hold - Seated Hamstring Stretch  - 1 x daily - 7 x weekly - 3 sets - 3 reps - 10=20 hold - Supine Bridge with Resistance Band  - 1 x daily - 7 x weekly - 3 sets - 10 reps - Supine March  - 1 x daily - 7 x weekly - 3 sets - 10 reps     PATIENT EDUCATION:  Education details: HEP, symptom management,  Person educated: Patient Education method: Explanation, Demonstration, Tactile cues, Verbal cues, and Handouts Education comprehension: verbalized understanding, returned demonstration, verbal cues required, tactile cues required, and needs further education  HOME EXERCISE PROGRAM: Access Code: [email or web address] URL: [email or web address] Date: 07/27/2022 Prepared by: Lorayne Bender  Exercises - Supine Piriformis Stretch with Foot on Ground  - 1 x daily - 7 x weekly - 3 sets - 3 reps - 20 sec  hold - Seated Bilateral Shoulder Flexion Towel Slide at Table Top  - 1 x daily - 7 x weekly - 3 sets - 3 reps - 20 sec  hold - Seated Hamstring Stretch  - 1 x daily - 7 x weekly - 3 sets - 3 reps - 10=20 hold - Supine  Bridge with Resistance Band  - 1 x daily - 7 x weekly - 3 sets - 10 reps - Supine March  - 1 x daily - 7 x weekly - 3 sets - 10 reps  ASSESSMENT:  CLINICAL IMPRESSION: Therapy performed progress note on the patient today.  She is on her third treatment visit.  She is very had significant improvement in left leg strength.  She is also made significant improvement on Foto.  She feels like she is having left stiffness when she sits for periods of time.  She has had very little stiffness in the morning.  We reviewed LTR for when she gets out of bed in the morning.  We gave her an updated HEP.  We also trialed gym activity today.  She tolerated well.  She goes to [employer].  She has not done much work on machines.  She generally works on [employer].  Continue to progress as tolerated.  She would benefit from further skilled therapy 1W6.   OBJECTIVE IMPAIRMENTS: Abnormal gait, decreased activity tolerance,  difficulty walking, decreased ROM, decreased strength, and pain.   ACTIVITY LIMITATIONS: sitting, standing, squatting, stairs, and locomotion level  PARTICIPATION LIMITATIONS: meal prep, cleaning, laundry, shopping, community activity, and yard work  PERSONAL FACTORS: 1-2 comorbidities: osteopetrosis  are also affecting patient's functional outcome.   REHAB POTENTIAL: Good  CLINICAL DECISION MAKING: Evolving/moderate complexity increased difficulty transferring from sit to stand and then ambulating  EVALUATION COMPLEXITY: Moderate   GOALS: Goals reviewed with patient? Yes  SHORT TERM GOALS: Target date: 08/17/2022   Patient will increase bilateral lower extremity strength by 5 pounds Baseline: Goal status: INITIAL  2.  Patient will report a 50% reduction in stiffness with transfer from sit to stand Baseline:  Goal status: IN PROGRESS 8/15  3.  Patient will be independent with HEP Baseline:  Goal status: IN PROGRESS 8/15 (was out of town and did not do exercises)  LONG TERM  GOALS: Target date: 09/07/2022    The patient will go up and down 6 steps with reciprocal pattern  Baseline:  Goal status: INITIAL  2.  Patient will stand and walk without stiffness  Baseline:  Goal status: INITIAL  3.  Patient will be independent with complete HEP Baseline:  Goal status: INITIAL   PLAN:  PT FREQUENCY: 1-2x/week  PT DURATION: 6 weeks  PLANNED INTERVENTIONS: Therapeutic exercises, Therapeutic activity, Neuromuscular re-education, Balance training, Gait training, Patient/Family education, Self Care, Joint mobilization, Stair training, DME instructions, Aquatic Therapy, Dry Needling, Cryotherapy, Moist heat, and Manual therapy  PLAN FOR NEXT SESSION: Patient has a gym membership but reports she just goes for line dancing.  Patient given stretches previous visit.  Next visit focus on expanding supine exercises.  Add sit to stand transfers.  Review tolerance to stretching and HEP.  Continue with core stabilization exercises as tolerated.  Consider standing series of core stabilization as it may be easier for her to do at home rather than having to lie down.   Dessie Coma, PT 09/07/2022, 11:52 AM

## 2022-09-08 ENCOUNTER — Encounter (HOSPITAL_BASED_OUTPATIENT_CLINIC_OR_DEPARTMENT_OTHER): Payer: Self-pay

## 2022-09-08 ENCOUNTER — Ambulatory Visit (HOSPITAL_BASED_OUTPATIENT_CLINIC_OR_DEPARTMENT_OTHER): Payer: Medicare Other

## 2022-09-08 DIAGNOSIS — R2689 Other abnormalities of gait and mobility: Secondary | ICD-10-CM

## 2022-09-08 NOTE — Progress Notes (Signed)
  Physician Documentation  Your signature is required to indicate approval of the treatment plan as stated above. By signing this report, you are approving the plan of care. Please sign and either send electronically or print and fax the signed copy to the number below. If you approve with modifications, please indicate those in the space provided.________________ Physician Signature: Crawford Givens ________________ Date:__08/21/24 _____ Time:___9:50 AM

## 2022-09-08 NOTE — Therapy (Signed)
OUTPATIENT PHYSICAL THERAPY LOWER EXTREMITY TREATMENT/    Patient Name: Patricia Hoover MRN: [id number] DOB:[birth year], 77 y.o., female Today's Date: 09/08/2022  END OF SESSION:  PT End of Session - 09/08/22 0833     Visit Number 5    Number of Visits 10    Date for PT Re-Evaluation 10/19/22    PT Start Time 0841    PT Stop Time 0925    PT Time Calculation (min) 44 min    Activity Tolerance Patient tolerated treatment well    Behavior During Therapy [facility] for tasks assessed/performed                 Past Medical History:  Diagnosis Date   Arrhythmia    possible hx, resolved prev   Blood transfusion without reported diagnosis 1972   had reaction; had to stop   Carpal tunnel syndrome    Cataract 2019   resolved with surgery   Duodenal ulcer    H/O exercise stress test 2012   normal   Heart murmur    as child   High cholesterol    History of kidney stones    Hx of colonic polyps    Hypertension    Osteoporosis 2010   t score - 3.9   Renal stones    Past Surgical History:  Procedure Laterality Date   ABDOMINAL HYSTERECTOMY  1972   for endometriosis   CATARACT EXTRACTION W/ INTRAOCULAR LENS IMPLANT Right 06/2017   CATARACT EXTRACTION W/ INTRAOCULAR LENS IMPLANT Left 07/2017   COLONOSCOPY Left 03/02/2017   Procedure: COLONOSCOPY;  Surgeon: Pasty Spillers, MD;  Location: [facility] ENDOSCOPY;  Service: Endoscopy;  Laterality: Left;   COLONOSCOPY WITH PROPOFOL N/A 05/04/2016   Procedure: COLONOSCOPY WITH PROPOFOL;  Surgeon: Wyline Mood, MD;  Location: [facility] ENDOSCOPY;  Service: Endoscopy;  Laterality: N/A;   ESOPHAGOGASTRODUODENOSCOPY Left 03/02/2017   Procedure: ESOPHAGOGASTRODUODENOSCOPY (EGD);  Surgeon: Pasty Spillers, MD;  [facility] ENDOSCOPY;  Service: Endoscopy;  Laterality: Left;   ESOPHAGOGASTRODUODENOSCOPY (EGD) WITH PROPOFOL N/A 04/08/2017   Procedure: ESOPHAGOGASTRODUODENOSCOPY (EGD) WITH PROPOFOL;  Surgeon: Pasty Spillers, MD;  Location: [facility]  ENDOSCOPY;  Service: Endoscopy;  Laterality: N/A;   LITHOTRIPSY     for kidney stone x5   LITHOTRIPSY  09/2017   Patient Active Problem List   Diagnosis Date Noted   Dysuria 04/21/2022   Stiff-legged gait 04/21/2022   Scalp lesion 09/21/2021   Clavicle enlargement 04/05/2021   Hyperpigmentation 03/01/2021   Vitamin D deficiency 03/01/2021   Leg pain 05/21/2019   Right leg pain 05/21/2019   Healthcare maintenance 01/08/2018   Paresthesia 01/08/2018   Neck pain 08/31/2017   Angioedema 06/15/2017   Peptic ulcer of duodenum    Other specified diseases of esophagus    Hematochezia    External hemorrhoids    Diverticulosis of large intestine without diverticulitis    Internal hemorrhoids    Duodenal ulceration    Advance care planning 12/23/2016   Renal stone 04/20/2016   Heartburn 12/17/2015   Medicare annual wellness visit, subsequent 06/05/2013   Hyperglycemia 06/05/2013   Knee pain 08/31/2012   Osteoporosis 08/18/2012   HTN (hypertension) 08/09/2012   HLD (hyperlipidemia) 08/09/2012    PCP: Dr Crawford Givens   REFERRING PROVIDER: Dr Crawford Givens   REFERRING DIAG:  Diagnosis  R26.89 (ICD-10-CM) - Stiff-legged gait    THERAPY DIAG:  Other abnormalities of gait and mobility  Rationale for Evaluation and Treatment: Rehabilitation  ONSET DATE:   SUBJECTIVE:   SUBJECTIVE  STATEMENT: Some fatigue after last visit, but no soreness.  PERTINENT HISTORY: Osteoporosis 2010 t score - 3.9  Carpal tunnel syndrome   PAIN:  Are you having pain? PAIN:  Are you having pain? No   PRECAUTIONS: None  WEIGHT BEARING RESTRICTIONS: No  FALLS:  Has patient fallen in last 6 months? No  LIVING ENVIRONMENT: Has steps.   OCCUPATION:  Retired   Hobbies:   Health visitor   PLOF: Independent  PATIENT GOALS:  To be able to walk better    NEXT MD VISIT:  Nothing scheduled   OBJECTIVE:   DIAGNOSTIC FINDINGS:  MRI: 1. At L4-5 there is a broad-based disc  bulge. Severe bilateral facet arthropathy with ligamentum flavum infolding. Bilateral lateral recess narrowing. Mild bilateral foraminal stenosis. Mild grade 1 anterolisthesis of L4 on L5 secondary to facet disease. PATIENT SURVEYS:  FOTO    COGNITION: Overall cognitive status: Within functional limits for tasks assessed     SENSATION: Denies paresthesias   EDEMA:   MUSCLE LENGTH:  POSTURE: No Significant postural limitations  PALPATION: Tender to palpation in lower lumbar paraspinals   LOWER EXTREMITY ROM:  Active ROM Right eval Left eval  Hip flexion    Hip extension    Hip abduction    Hip adduction    Hip internal rotation    Hip external rotation    Knee flexion    Knee extension    Ankle dorsiflexion    Ankle plantarflexion    Ankle inversion    Ankle eversion     (Blank rows = not tested)  LUMBAR ROM:   Active  A/PROM  eval  Flexion   Extension   Right lateral flexion   Left lateral flexion   Right rotation   Left rotation    (Blank rows = not tested)   LOWER EXTREMITY MMT:  MMT Right eval Left eval Right  8/20 Left  8/20  Hip flexion 20.9 12.9 17.2 17.0  Hip extension      Hip abduction 26.9 29.8 24.5 28.1  Hip adduction      Hip internal rotation      Hip external rotation      Knee flexion      Knee extension 22.8 18.3 29.8 22.9  Ankle dorsiflexion      Ankle plantarflexion      Ankle inversion      Ankle eversion       (Blank rows = not tested)    GAIT: Significant lateral shift to the right with ambulation.  Decreased bilateral knee bend with ambulation.  TODAY'S TREATMENT:                                                                                                                              DATE:   8/21 Nu-step L4 (UE/LE)  LTR x15  Gluteal stretch 2x20 sec hold bilateral   Bridge 2 x 10  Sit to stands 2x10  Side stepping at counter with RTB at ankles x3laps  (LF) Leg press 3 x 10 10 pounds  Hip  adduction machine 3 x 10 45 pounds reviewed proper set up of the machine Hip abduction machine- 35lbs 3x10  HR 3x10 Step ups fwd 6" 2x10bil Lateral step ups 4" 2x10 L      PATIENT EDUCATION:  Education details: HEP, symptom management,  Person educated: Patient Education method: Explanation, Demonstration, Tactile cues, Verbal cues, and Handouts Education comprehension: verbalized understanding, returned demonstration, verbal cues required, tactile cues required, and needs further education  HOME EXERCISE PROGRAM: Access Code: [email or web address] URL: [email or web address] Date: 07/27/2022 Prepared by: Lorayne Bender  Exercises - Supine Piriformis Stretch with Foot on Ground  - 1 x daily - 7 x weekly - 3 sets - 3 reps - 20 sec  hold - Seated Bilateral Shoulder Flexion Towel Slide at Table Top  - 1 x daily - 7 x weekly - 3 sets - 3 reps - 20 sec  hold - Seated Hamstring Stretch  - 1 x daily - 7 x weekly - 3 sets - 3 reps - 10=20 hold - Supine Bridge with Resistance Band  - 1 x daily - 7 x weekly - 3 sets - 10 reps - Supine March  - 1 x daily - 7 x weekly - 3 sets - 10 reps  ASSESSMENT:  CLINICAL IMPRESSION: Cues required to prevent knee hyperextension with leg press exercise. Mild L knee pain with leg press today. Good tolerance for step ups and resisted side stepping. Pt has line dancing later today. Will continue to progress strengthening as tolerated in order to meet goals.    OBJECTIVE IMPAIRMENTS: Abnormal gait, decreased activity tolerance, difficulty walking, decreased ROM, decreased strength, and pain.   ACTIVITY LIMITATIONS: sitting, standing, squatting, stairs, and locomotion level  PARTICIPATION LIMITATIONS: meal prep, cleaning, laundry, shopping, community activity, and yard work  PERSONAL FACTORS: 1-2 comorbidities: osteopetrosis  are also affecting patient's functional outcome.   REHAB POTENTIAL: Good  CLINICAL DECISION MAKING: Evolving/moderate complexity  increased difficulty transferring from sit to stand and then ambulating  EVALUATION COMPLEXITY: Moderate   GOALS: Goals reviewed with patient? Yes  SHORT TERM GOALS: Target date: 08/17/2022   Patient will increase bilateral lower extremity strength by 5 pounds Baseline: Goal status: INITIAL  2.  Patient will report a 50% reduction in stiffness with transfer from sit to stand Baseline:  Goal status: IN PROGRESS 8/15  3.  Patient will be independent with HEP Baseline:  Goal status: IN PROGRESS 8/15 (was out of town and did not do exercises)  LONG TERM GOALS: Target date: 09/07/2022    The patient will go up and down 6 steps with reciprocal pattern  Baseline:  Goal status: INITIAL  2.  Patient will stand and walk without stiffness  Baseline:  Goal status: INITIAL  3.  Patient will be independent with complete HEP Baseline:  Goal status: INITIAL   PLAN:  PT FREQUENCY: 1-2x/week  PT DURATION: 6 weeks  PLANNED INTERVENTIONS: Therapeutic exercises, Therapeutic activity, Neuromuscular re-education, Balance training, Gait training, Patient/Family education, Self Care, Joint mobilization, Stair training, DME instructions, Aquatic Therapy, Dry Needling, Cryotherapy, Moist heat, and Manual therapy  PLAN FOR NEXT SESSION: Patient has a gym membership but reports she just goes for line dancing.  Patient given stretches previous visit.  Next visit focus on expanding supine exercises.  Add sit to stand transfers.  Review tolerance to stretching and HEP.  Continue with core stabilization exercises as  tolerated.  Consider standing series of core stabilization as it may be easier for her to do at home rather than having to lie down.   Donnel Saxon Shraga Custard, PTA 09/08/2022, 10:07 AM

## 2022-09-14 ENCOUNTER — Ambulatory Visit (HOSPITAL_BASED_OUTPATIENT_CLINIC_OR_DEPARTMENT_OTHER): Payer: Medicare Other | Admitting: Physical Therapy

## 2022-09-14 ENCOUNTER — Encounter (HOSPITAL_BASED_OUTPATIENT_CLINIC_OR_DEPARTMENT_OTHER): Payer: Self-pay | Admitting: Physical Therapy

## 2022-09-14 DIAGNOSIS — R2689 Other abnormalities of gait and mobility: Secondary | ICD-10-CM

## 2022-09-14 NOTE — Therapy (Unsigned)
OUTPATIENT PHYSICAL THERAPY LOWER EXTREMITY TREATMENT/    Patient Name: Patricia Hoover MRN: [id number] DOB:1945-06-18, 77 y.o., female Today's Date: 09/14/2022  END OF SESSION:  PT End of Session - 09/14/22 1304     Visit Number 6    Number of Visits 10    Date for PT Re-Evaluation 10/19/22    PT Start Time 1300    PT Stop Time 1343    PT Time Calculation (min) 43 min    Activity Tolerance Patient tolerated treatment well    Behavior During Therapy [facility] for tasks assessed/performed                 Past Medical History:  Diagnosis Date   Arrhythmia    possible hx, resolved prev   Blood transfusion without reported diagnosis 1972   had reaction; had to stop   Carpal tunnel syndrome    Cataract 2019   resolved with surgery   Duodenal ulcer    H/O exercise stress test 2012   normal   Heart murmur    as child   High cholesterol    History of kidney stones    Hx of colonic polyps    Hypertension    Osteoporosis 2010   t score - 3.9   Renal stones    Past Surgical History:  Procedure Laterality Date   ABDOMINAL HYSTERECTOMY  1972   for endometriosis   CATARACT EXTRACTION W/ INTRAOCULAR LENS IMPLANT Right 06/2017   CATARACT EXTRACTION W/ INTRAOCULAR LENS IMPLANT Left 07/2017   COLONOSCOPY Left 03/02/2017   Procedure: COLONOSCOPY;  Surgeon: Pasty Spillers, MD;  Location: [facility] ENDOSCOPY;  Service: Endoscopy;  Laterality: Left;   COLONOSCOPY WITH PROPOFOL N/A 05/04/2016   Procedure: COLONOSCOPY WITH PROPOFOL;  Surgeon: Wyline Mood, MD;  Location: [facility] ENDOSCOPY;  Service: Endoscopy;  Laterality: N/A;   ESOPHAGOGASTRODUODENOSCOPY Left 03/02/2017   Procedure: ESOPHAGOGASTRODUODENOSCOPY (EGD);  Surgeon: Pasty Spillers, MD;  Location: [facility] ENDOSCOPY;  Service: Endoscopy;  Laterality: Left;   ESOPHAGOGASTRODUODENOSCOPY (EGD) WITH PROPOFOL N/A 04/08/2017   Procedure: ESOPHAGOGASTRODUODENOSCOPY (EGD) WITH PROPOFOL;  Surgeon: Pasty Spillers, MD;  Location: [facility]  ENDOSCOPY;  Service: Endoscopy;  Laterality: N/A;   LITHOTRIPSY     for kidney stone x5   LITHOTRIPSY  09/2017   Patient Active Problem List   Diagnosis Date Noted   Dysuria 04/21/2022   Stiff-legged gait 04/21/2022   Scalp lesion 09/21/2021   Clavicle enlargement 04/05/2021   Hyperpigmentation 03/01/2021   Vitamin D deficiency 03/01/2021   Leg pain 05/21/2019   Right leg pain 05/21/2019   Healthcare maintenance 01/08/2018   Paresthesia 01/08/2018   Neck pain 08/31/2017   Angioedema 06/15/2017   Peptic ulcer of duodenum    Other specified diseases of esophagus    Hematochezia    External hemorrhoids    Diverticulosis of large intestine without diverticulitis    Internal hemorrhoids    Duodenal ulceration    Advance care planning 12/23/2016   Renal stone 04/20/2016   Heartburn 12/17/2015   Medicare annual wellness visit, subsequent 06/05/2013   Hyperglycemia 06/05/2013   Knee pain 08/31/2012   Osteoporosis 08/18/2012   HTN (hypertension) 08/09/2012   HLD (hyperlipidemia) 08/09/2012    PCP: Dr Crawford Givens   REFERRING PROVIDER: Dr Crawford Givens   REFERRING DIAG:  Diagnosis  R26.89 (ICD-10-CM) - Stiff-legged gait    THERAPY DIAG:  Other abnormalities of gait and mobility  Rationale for Evaluation and Treatment: Rehabilitation  ONSET DATE:   SUBJECTIVE:   SUBJECTIVE  STATEMENT: The patient reports  PERTINENT HISTORY: Osteoporosis 2010 t score - 3.9  Carpal tunnel syndrome   PAIN:  Are you having pain? PAIN:  Are you having pain? No   PRECAUTIONS: None  WEIGHT BEARING RESTRICTIONS: No  FALLS:  Has patient fallen in last 6 months? No  LIVING ENVIRONMENT: Has steps.   OCCUPATION:  Retired   Hobbies:   Health visitor   PLOF: Independent  PATIENT GOALS:  To be able to walk better    NEXT MD VISIT:  Nothing scheduled   OBJECTIVE:   DIAGNOSTIC FINDINGS:  MRI: 1. At L4-5 there is a broad-based disc bulge. Severe bilateral  facet arthropathy with ligamentum flavum infolding. Bilateral lateral recess narrowing. Mild bilateral foraminal stenosis. Mild grade 1 anterolisthesis of L4 on L5 secondary to facet disease. PATIENT SURVEYS:  FOTO    COGNITION: Overall cognitive status: Within functional limits for tasks assessed     SENSATION: Denies paresthesias   EDEMA:   MUSCLE LENGTH:  POSTURE: No Significant postural limitations  PALPATION: Tender to palpation in lower lumbar paraspinals   LOWER EXTREMITY ROM:  Active ROM Right eval Left eval  Hip flexion    Hip extension    Hip abduction    Hip adduction    Hip internal rotation    Hip external rotation    Knee flexion    Knee extension    Ankle dorsiflexion    Ankle plantarflexion    Ankle inversion    Ankle eversion     (Blank rows = not tested)  LUMBAR ROM:   Active  A/PROM  eval  Flexion   Extension   Right lateral flexion   Left lateral flexion   Right rotation   Left rotation    (Blank rows = not tested)   LOWER EXTREMITY MMT:  MMT Right eval Left eval Right  8/20 Left  8/20  Hip flexion 20.9 12.9 17.2 17.0  Hip extension      Hip abduction 26.9 29.8 24.5 28.1  Hip adduction      Hip internal rotation      Hip external rotation      Knee flexion      Knee extension 22.8 18.3 29.8 22.9  Ankle dorsiflexion      Ankle plantarflexion      Ankle inversion      Ankle eversion       (Blank rows = not tested)    GAIT: Significant lateral shift to the right with ambulation.  Decreased bilateral knee bend with ambulation.  TODAY'S TREATMENT:                                                                                                                              DATE:  8/27  8/21 Nu-step L4 (UE/LE)  LTR x15  Gluteal stretch 2x20 sec hold bilateral   Bridge 2 x 10  Sit to stands 2x10 Side stepping at counter  with RTB at ankles x3laps  (LF) Leg press 3 x 10 10 pounds  Hip adduction machine 3 x 10  45 pounds reviewed proper set up of the machine Hip abduction machine- 35lbs 3x10  HR 3x10 Step ups fwd 6" 2x10bil Lateral step ups 4" 2x10 L      PATIENT EDUCATION:  Education details: HEP, symptom management,  Person educated: Patient Education method: Explanation, Demonstration, Tactile cues, Verbal cues, and Handouts Education comprehension: verbalized understanding, returned demonstration, verbal cues required, tactile cues required, and needs further education  HOME EXERCISE PROGRAM: Access Code: [email or web address] URL: [email or web address] Date: 07/27/2022 Prepared by: Lorayne Bender  Exercises - Supine Piriformis Stretch with Foot on Ground  - 1 x daily - 7 x weekly - 3 sets - 3 reps - 20 sec  hold - Seated Bilateral Shoulder Flexion Towel Slide at Table Top  - 1 x daily - 7 x weekly - 3 sets - 3 reps - 20 sec  hold - Seated Hamstring Stretch  - 1 x daily - 7 x weekly - 3 sets - 3 reps - 10=20 hold - Supine Bridge with Resistance Band  - 1 x daily - 7 x weekly - 3 sets - 10 reps - Supine March  - 1 x daily - 7 x weekly - 3 sets - 10 reps  ASSESSMENT:  CLINICAL IMPRESSION: Cues required to prevent knee hyperextension with leg press exercise. Mild L knee pain with leg press today. Good tolerance for step ups and resisted side stepping. Pt has line dancing later today. Will continue to progress strengthening as tolerated in order to meet goals.    OBJECTIVE IMPAIRMENTS: Abnormal gait, decreased activity tolerance, difficulty walking, decreased ROM, decreased strength, and pain.   ACTIVITY LIMITATIONS: sitting, standing, squatting, stairs, and locomotion level  PARTICIPATION LIMITATIONS: meal prep, cleaning, laundry, shopping, community activity, and yard work  PERSONAL FACTORS: 1-2 comorbidities: osteopetrosis  are also affecting patient's functional outcome.   REHAB POTENTIAL: Good  CLINICAL DECISION MAKING: Evolving/moderate complexity increased difficulty  transferring from sit to stand and then ambulating  EVALUATION COMPLEXITY: Moderate   GOALS: Goals reviewed with patient? Yes  SHORT TERM GOALS: Target date: 08/17/2022   Patient will increase bilateral lower extremity strength by 5 pounds Baseline: Goal status: INITIAL  2.  Patient will report a 50% reduction in stiffness with transfer from sit to stand Baseline:  Goal status: IN PROGRESS 8/15  3.  Patient will be independent with HEP Baseline:  Goal status: IN PROGRESS 8/15 (was out of town and did not do exercises)  LONG TERM GOALS: Target date: 09/07/2022    The patient will go up and down 6 steps with reciprocal pattern  Baseline:  Goal status: INITIAL  2.  Patient will stand and walk without stiffness  Baseline:  Goal status: INITIAL  3.  Patient will be independent with complete HEP Baseline:  Goal status: INITIAL   PLAN:  PT FREQUENCY: 1-2x/week  PT DURATION: 6 weeks  PLANNED INTERVENTIONS: Therapeutic exercises, Therapeutic activity, Neuromuscular re-education, Balance training, Gait training, Patient/Family education, Self Care, Joint mobilization, Stair training, DME instructions, Aquatic Therapy, Dry Needling, Cryotherapy, Moist heat, and Manual therapy  PLAN FOR NEXT SESSION: Patient has a gym membership but reports she just goes for line dancing.  Patient given stretches previous visit.  Next visit focus on expanding supine exercises.  Add sit to stand transfers.  Review tolerance to stretching and HEP.  Continue with core stabilization exercises as tolerated.  Consider standing  series of core stabilization as it may be easier for her to do at home rather than having to lie down.   Dessie Coma, PT 09/14/2022, 1:05 PM

## 2022-09-15 ENCOUNTER — Encounter (HOSPITAL_BASED_OUTPATIENT_CLINIC_OR_DEPARTMENT_OTHER): Payer: Self-pay | Admitting: Physical Therapy

## 2022-09-23 ENCOUNTER — Encounter: Payer: Self-pay | Admitting: Family Medicine

## 2022-09-23 ENCOUNTER — Telehealth: Payer: Self-pay

## 2022-09-23 ENCOUNTER — Ambulatory Visit (INDEPENDENT_AMBULATORY_CARE_PROVIDER_SITE_OTHER): Payer: Medicare Other | Admitting: Family Medicine

## 2022-09-23 VITALS — BP 112/78 | HR 86 | Temp 97.7°F | Ht <= 58 in | Wt 151.0 lb

## 2022-09-23 DIAGNOSIS — M79606 Pain in leg, unspecified: Secondary | ICD-10-CM | POA: Diagnosis not present

## 2022-09-23 DIAGNOSIS — Z23 Encounter for immunization: Secondary | ICD-10-CM

## 2022-09-23 NOTE — Progress Notes (Signed)
L knee pain.  Posterior midline knee pain.  Pain with prolonged sitting.  Using methol rub.  More pain after last PT appointment.  No trauma.  No bruising, no swelling.  No pain in bed in the AM but pain after getting out of bed.  Pain then gets better the more she walks.  Meds, vitals, and allergies reviewed.   ROS: Per HPI unless specifically indicated in ROS section   Nad Ncat Left knee without bruising swelling or redness.  Medial and lateral joint line not tender.  Patella not tender.  Able to bear weight and range of motion intact.  ACL feels stable.  MCL and LCL feel stable.  She has some tenderness at the proximal midline portion of the calf without any mass.  No swelling.

## 2022-09-23 NOTE — Patient Instructions (Signed)
Likely calf strain.  Gently stretch your calf with a belt prior to getting up.  Don't go barefoot.   It should get better.

## 2022-09-23 NOTE — Telephone Encounter (Signed)
Pt came to office and told front registration personnel that she had appt with Dr Para March today 09/23/22 at 3 pm. Irving Burton at front desk advised pt she did not see appt scheduled at [facility] for today for pt.and could schedule pt appt mon 09/24/22. Pt said she called on 09/21/22 and spoke with someone and appt was scheduled for pt today at 3 PM with Dr Para March. Someone from upfront spoke with Vernona Rieger office admin and she went to speak with pt as I did when Mia mentioned to pt I could triage her. There were no available appts at [facility] this afternoon and pt said this was not the first time this had happened with her appt. I spoke with Dr Para March who said he would add pt on and pt said she did not mind waiting. Pt said the "other day " pt was at rehab PT and after working on a machine pt said lt knee popped and then pt had pain in lt knee and pt has been walking with cane.(Pt did not bring cane with her today to [facility].) pt said that she wanted to be seen to get something to take pressure off knee; ? Brace or whatever Dr Para March thought appropriate. Pt was added to Dr Para March schedule and sending note to Dr Para March and Para March pool.

## 2022-09-24 NOTE — Telephone Encounter (Signed)
See OV note.  

## 2022-09-26 NOTE — Assessment & Plan Note (Signed)
Likely calf strain.  Gently stretch calf with a belt prior to getting up.  Discussed options for stretching. Don't go barefoot.   It should get better.  Update [place] as needed.  She agrees.

## 2022-09-28 ENCOUNTER — Encounter: Payer: Self-pay | Admitting: Family Medicine

## 2022-09-28 NOTE — Telephone Encounter (Signed)
Called patient about another message reviewed letter with her and answered all questions.

## 2022-09-28 NOTE — Telephone Encounter (Signed)
Subject information regarding leg brace was a title. The other words were picked up from the TV. Had it on speaker

## 2022-09-28 NOTE — Telephone Encounter (Signed)
Added to original message.  No further action needed at this time.

## 2022-09-28 NOTE — Telephone Encounter (Signed)
Called patient to get more information. States that there is not change in her symptoms. States that she is still not able to walk without cain. She wanted to know if there was any type of brace  she could try that might help with the pain and give her ability to put some pressure on leg.

## 2022-09-29 ENCOUNTER — Encounter (HOSPITAL_BASED_OUTPATIENT_CLINIC_OR_DEPARTMENT_OTHER): Payer: Self-pay | Admitting: Physical Therapy

## 2022-09-29 ENCOUNTER — Ambulatory Visit (HOSPITAL_BASED_OUTPATIENT_CLINIC_OR_DEPARTMENT_OTHER): Payer: Medicare Other | Attending: Family Medicine | Admitting: Physical Therapy

## 2022-09-29 DIAGNOSIS — R2689 Other abnormalities of gait and mobility: Secondary | ICD-10-CM | POA: Diagnosis not present

## 2022-09-29 NOTE — Therapy (Signed)
OUTPATIENT PHYSICAL THERAPY LOWER EXTREMITY TREATMENT/    Patient Name: Patricia Hoover MRN: [id number] DOB:1945/10/18, 77 y.o., female Today's Date: 09/29/2022  END OF SESSION:        Past Medical History:  Diagnosis Date   Arrhythmia    possible hx, resolved prev   Blood transfusion without reported diagnosis 1972   had reaction; had to stop   Carpal tunnel syndrome    Cataract 2019   resolved with surgery   Duodenal ulcer    H/O exercise stress test 2012   normal   Heart murmur    as child   High cholesterol    History of kidney stones    Hx of colonic polyps    Hypertension    Osteoporosis 2010   t score - 3.9   Renal stones    Past Surgical History:  Procedure Laterality Date   ABDOMINAL HYSTERECTOMY  1972   for endometriosis   CATARACT EXTRACTION W/ INTRAOCULAR LENS IMPLANT Right 06/2017   CATARACT EXTRACTION W/ INTRAOCULAR LENS IMPLANT Left 07/2017   COLONOSCOPY Left 03/02/2017   Procedure: COLONOSCOPY;  Surgeon: Pasty Spillers, MD;  Location: [facility] ENDOSCOPY;  Service: Endoscopy;  Laterality: Left;   COLONOSCOPY WITH PROPOFOL N/A 05/04/2016   Procedure: COLONOSCOPY WITH PROPOFOL;  Surgeon: Wyline Mood, MD;  Location: [facility] ENDOSCOPY;  Service: Endoscopy;  Laterality: N/A;   ESOPHAGOGASTRODUODENOSCOPY Left 03/02/2017   Procedure: ESOPHAGOGASTRODUODENOSCOPY (EGD);  Surgeon: Pasty Spillers, MD;  Location: [facility] ENDOSCOPY;  Service: Endoscopy;  Laterality: Left;   ESOPHAGOGASTRODUODENOSCOPY (EGD) WITH PROPOFOL N/A 04/08/2017   Procedure: ESOPHAGOGASTRODUODENOSCOPY (EGD) WITH PROPOFOL;  Surgeon: Pasty Spillers, MD;  Location: [facility] ENDOSCOPY;  Service: Endoscopy;  Laterality: N/A;   LITHOTRIPSY     for kidney stone x5   LITHOTRIPSY  09/2017   Patient Active Problem List   Diagnosis Date Noted   Dysuria 04/21/2022   Stiff-legged gait 04/21/2022   Scalp lesion 09/21/2021   Clavicle enlargement 04/05/2021   Hyperpigmentation 03/01/2021   Vitamin D  deficiency 03/01/2021   Leg pain 05/21/2019   Right leg pain 05/21/2019   Healthcare maintenance 01/08/2018   Paresthesia 01/08/2018   Neck pain 08/31/2017   Angioedema 06/15/2017   Peptic ulcer of duodenum    Other specified diseases of esophagus    Hematochezia    External hemorrhoids    Diverticulosis of large intestine without diverticulitis    Internal hemorrhoids    Duodenal ulceration    Advance care planning 12/23/2016   Renal stone 04/20/2016   Heartburn 12/17/2015   Medicare annual wellness visit, subsequent 06/05/2013   Hyperglycemia 06/05/2013   Knee pain 08/31/2012   Osteoporosis 08/18/2012   HTN (hypertension) 08/09/2012   HLD (hyperlipidemia) 08/09/2012    PCP: Dr Crawford Givens   REFERRING PROVIDER: Dr Crawford Givens   REFERRING DIAG:  Diagnosis  R26.89 (ICD-10-CM) - Stiff-legged gait    THERAPY DIAG:  No diagnosis found.  Rationale for Evaluation and Treatment: Rehabilitation  ONSET DATE:   SUBJECTIVE:   SUBJECTIVE STATEMENT: The patient comes in today reporting significant knee pain following her last visit.  She feels like it might have been the hip abduction or abduction machine.  She has had difficulty weightbearing.  She is already using a cane again. PERTINENT HISTORY: Osteoporosis 2010 t score - 3.9  Carpal tunnel syndrome   PAIN:  Are you having pain? PAIN:  Are you having pain? No   PRECAUTIONS: None  WEIGHT BEARING RESTRICTIONS: No  FALLS:  Has patient fallen  in last 6 months? No  LIVING ENVIRONMENT: Has steps.   OCCUPATION:  Retired   Hobbies:   Health visitor   PLOF: Independent  PATIENT GOALS:  To be able to walk better    NEXT MD VISIT:  Nothing scheduled   OBJECTIVE:   DIAGNOSTIC FINDINGS:  MRI: 1. At L4-5 there is a broad-based disc bulge. Severe bilateral facet arthropathy with ligamentum flavum infolding. Bilateral lateral recess narrowing. Mild bilateral foraminal stenosis. Mild grade  1 anterolisthesis of L4 on L5 secondary to facet disease. PATIENT SURVEYS:  FOTO    COGNITION: Overall cognitive status: Within functional limits for tasks assessed     SENSATION: Denies paresthesias   EDEMA:   MUSCLE LENGTH:  POSTURE: No Significant postural limitations  PALPATION: Tender to palpation in lower lumbar paraspinals   LOWER EXTREMITY ROM:  Active ROM Right eval Left eval  Hip flexion    Hip extension    Hip abduction    Hip adduction    Hip internal rotation    Hip external rotation    Knee flexion    Knee extension    Ankle dorsiflexion    Ankle plantarflexion    Ankle inversion    Ankle eversion     (Blank rows = not tested)  LUMBAR ROM:   Active  A/PROM  eval  Flexion   Extension   Right lateral flexion   Left lateral flexion   Right rotation   Left rotation    (Blank rows = not tested)   LOWER EXTREMITY MMT:  MMT Right eval Left eval Right  8/20 Left  8/20  Hip flexion 20.9 12.9 17.2 17.0  Hip extension      Hip abduction 26.9 29.8 24.5 28.1  Hip adduction      Hip internal rotation      Hip external rotation      Knee flexion      Knee extension 22.8 18.3 29.8 22.9  Ankle dorsiflexion      Ankle plantarflexion      Ankle inversion      Ankle eversion       (Blank rows = not tested)    GAIT: Significant lateral shift to the right with ambulation.  Decreased bilateral knee bend with ambulation.  TODAY'S TREATMENT:                                                                                                                              DATE:  8/28 Manual: Trigger point release to lateral knee and IT band.  Grade 2 and 3 AP and PA mobilizations with distraction.  Vasopneumatic device: To the left knee, 32 degrees 15 minutes medially impression Quad set 2 x 10  Straight leg raise 2 x 10 8/21 Nu-step L4 (UE/LE)  LTR x15  Gluteal stretch 2x20 sec hold bilateral   Bridge 2 x 10  Sit to stands 2x10 Side  stepping at counter with  RTB at ankles x3laps  (LF) Leg press 3 x 10 10 pounds  Hip adduction machine 3 x 10 45 pounds reviewed proper set up of the machine Hip abduction machine- 35lbs 3x10  HR 3x10 Step ups fwd 6" 2x10bil Lateral step ups 4" 2x10 L      PATIENT EDUCATION:  Education details: HEP, symptom management,  Person educated: Patient Education method: Explanation, Demonstration, Tactile cues, Verbal cues, and Handouts Education comprehension: verbalized understanding, returned demonstration, verbal cues required, tactile cues required, and needs further education  HOME EXERCISE PROGRAM: Access Code: [email or web address] URL: [email or web address] Date: 07/27/2022 Prepared by: Lorayne Bender  Exercises - Supine Piriformis Stretch with Foot on Ground  - 1 x daily - 7 x weekly - 3 sets - 3 reps - 20 sec  hold - Seated Bilateral Shoulder Flexion Towel Slide at Table Top  - 1 x daily - 7 x weekly - 3 sets - 3 reps - 20 sec  hold - Seated Hamstring Stretch  - 1 x daily - 7 x weekly - 3 sets - 3 reps - 10=20 hold - Supine Bridge with Resistance Band  - 1 x daily - 7 x weekly - 3 sets - 10 reps - Supine March  - 1 x daily - 7 x weekly - 3 sets - 10 reps  ASSESSMENT:  CLINICAL IMPRESSION:  The patient tolerated more activity today with her knee. She continues to walk with a stiff knee. She has considerable TTP in her posterior knee. She has been th the MD. She reports he didn't say much about it. She continues to walk with a stiff leg. We worked on standing weight shifts to unlock the knee. Therapy updated her HEP and gave her exercises to work on at home. The MD feels like it may be a calf strain. We will continue to monitor.   The patient continues to have significant   OBJECTIVE IMPAIRMENTS: Abnormal gait, decreased activity tolerance, difficulty walking, decreased ROM, decreased strength, and pain.   ACTIVITY LIMITATIONS: sitting, standing, squatting, stairs, and  locomotion level  PARTICIPATION LIMITATIONS: meal prep, cleaning, laundry, shopping, community activity, and yard work  PERSONAL FACTORS: 1-2 comorbidities: osteopetrosis  are also affecting patient's functional outcome.   REHAB POTENTIAL: Good  CLINICAL DECISION MAKING: Evolving/moderate complexity increased difficulty transferring from sit to stand and then ambulating  EVALUATION COMPLEXITY: Moderate   GOALS: Goals reviewed with patient? Yes  SHORT TERM GOALS: Target date: 08/17/2022   Patient will increase bilateral lower extremity strength by 5 pounds Baseline: Goal status: INITIAL  2.  Patient will report a 50% reduction in stiffness with transfer from sit to stand Baseline:  Goal status: IN PROGRESS 8/15  3.  Patient will be independent with HEP Baseline:  Goal status: IN PROGRESS 8/15 (was out of town and did not do exercises)  LONG TERM GOALS: Target date: 09/07/2022    The patient will go up and down 6 steps with reciprocal pattern  Baseline:  Goal status: INITIAL  2.  Patient will stand and walk without stiffness  Baseline:  Goal status: INITIAL  3.  Patient will be independent with complete HEP Baseline:  Goal status: INITIAL   PLAN:  PT FREQUENCY: 1-2x/week  PT DURATION: 6 weeks  PLANNED INTERVENTIONS: Therapeutic exercises, Therapeutic activity, Neuromuscular re-education, Balance training, Gait training, Patient/Family education, Self Care, Joint mobilization, Stair training, DME instructions, Aquatic Therapy, Dry Needling, Cryotherapy, Moist heat, and Manual therapy  PLAN FOR NEXT SESSION: Patient has a gym membership  but reports she just goes for line dancing.  Patient given stretches previous visit.  Next visit focus on expanding supine exercises.  Add sit to stand transfers.  Review tolerance to stretching and HEP.  Continue with core stabilization exercises as tolerated.  Consider standing series of core stabilization as it may be easier for her  to do at home rather than having to lie down.   Dessie Coma, PT 09/29/2022, 2:31 PM

## 2022-10-05 ENCOUNTER — Encounter (HOSPITAL_BASED_OUTPATIENT_CLINIC_OR_DEPARTMENT_OTHER): Payer: Self-pay

## 2022-10-05 ENCOUNTER — Ambulatory Visit (HOSPITAL_BASED_OUTPATIENT_CLINIC_OR_DEPARTMENT_OTHER): Payer: Medicare Other

## 2022-10-05 DIAGNOSIS — R2689 Other abnormalities of gait and mobility: Secondary | ICD-10-CM | POA: Diagnosis not present

## 2022-10-05 NOTE — Therapy (Signed)
OUTPATIENT PHYSICAL THERAPY LOWER EXTREMITY TREATMENT   Patient Name: Patricia Hoover MRN: [id number] DOB:31-Oct-1945, 77 y.o., female Today's Date: 10/05/2022  END OF SESSION:  PT End of Session - 10/05/22 1427     Visit Number 8    Number of Visits 10    Date for PT Re-Evaluation 10/19/22    PT Start Time 1430    PT Stop Time 1515    PT Time Calculation (min) 45 min    Activity Tolerance Patient tolerated treatment well    Behavior During Therapy [facility] for tasks assessed/performed                  Past Medical History:  Diagnosis Date   Arrhythmia    possible hx, resolved prev   Blood transfusion without reported diagnosis 1972   had reaction; had to stop   Carpal tunnel syndrome    Cataract 2019   resolved with surgery   Duodenal ulcer    H/O exercise stress test 2012   normal   Heart murmur    as child   High cholesterol    History of kidney stones    Hx of colonic polyps    Hypertension    Osteoporosis 2010   t score - 3.9   Renal stones    Past Surgical History:  Procedure Laterality Date   ABDOMINAL HYSTERECTOMY  1972   for endometriosis   CATARACT EXTRACTION W/ INTRAOCULAR LENS IMPLANT Right 06/2017   CATARACT EXTRACTION W/ INTRAOCULAR LENS IMPLANT Left 07/2017   COLONOSCOPY Left 03/02/2017   Procedure: COLONOSCOPY;  Surgeon: Pasty Spillers, MD;  Location: [facility] ENDOSCOPY;  Service: Endoscopy;  Laterality: Left;   COLONOSCOPY WITH PROPOFOL N/A 05/04/2016   Procedure: COLONOSCOPY WITH PROPOFOL;  Surgeon: Wyline Mood, MD;  Location: [facility] ENDOSCOPY;  Service: Endoscopy;  Laterality: N/A;   ESOPHAGOGASTRODUODENOSCOPY Left 03/02/2017   Procedure: ESOPHAGOGASTRODUODENOSCOPY (EGD);  Surgeon: Pasty Spillers, MD;  Location: [facility] ENDOSCOPY;  Service: Endoscopy;  Laterality: Left;   ESOPHAGOGASTRODUODENOSCOPY (EGD) WITH PROPOFOL N/A 04/08/2017   Procedure: ESOPHAGOGASTRODUODENOSCOPY (EGD) WITH PROPOFOL;  Surgeon: Pasty Spillers, MD;  Location: [facility]  ENDOSCOPY;  Service: Endoscopy;  Laterality: N/A;   LITHOTRIPSY     for kidney stone x5   LITHOTRIPSY  09/2017   Patient Active Problem List   Diagnosis Date Noted   Dysuria 04/21/2022   Stiff-legged gait 04/21/2022   Scalp lesion 09/21/2021   Clavicle enlargement 04/05/2021   Hyperpigmentation 03/01/2021   Vitamin D deficiency 03/01/2021   Leg pain 05/21/2019   Right leg pain 05/21/2019   Healthcare maintenance 01/08/2018   Paresthesia 01/08/2018   Neck pain 08/31/2017   Angioedema 06/15/2017   Peptic ulcer of duodenum    Other specified diseases of esophagus    Hematochezia    External hemorrhoids    Diverticulosis of large intestine without diverticulitis    Internal hemorrhoids    Duodenal ulceration    Advance care planning 12/23/2016   Renal stone 04/20/2016   Heartburn 12/17/2015   Medicare annual wellness visit, subsequent 06/05/2013   Hyperglycemia 06/05/2013   Knee pain 08/31/2012   Osteoporosis 08/18/2012   HTN (hypertension) 08/09/2012   HLD (hyperlipidemia) 08/09/2012    PCP: Dr Crawford Givens   REFERRING PROVIDER: Dr Crawford Givens   REFERRING DIAG:  Diagnosis  R26.89 (ICD-10-CM) - Stiff-legged gait    THERAPY DIAG:  Other abnormalities of gait and mobility  Rationale for Evaluation and Treatment: Rehabilitation  ONSET DATE:   SUBJECTIVE:   SUBJECTIVE  STATEMENT: Pt reports no pain at rest. "My knee is feeling a lot better."  PERTINENT HISTORY: Osteoporosis 2010 t score - 3.9  Carpal tunnel syndrome   PAIN:  Are you having pain? PAIN:  Are you having pain? No   PRECAUTIONS: None  WEIGHT BEARING RESTRICTIONS: No  FALLS:  Has patient fallen in last 6 months? No  LIVING ENVIRONMENT: Has steps.   OCCUPATION:  Retired   Hobbies:   Health visitor   PLOF: Independent  PATIENT GOALS:  To be able to walk better    NEXT MD VISIT:  Nothing scheduled   OBJECTIVE:   DIAGNOSTIC FINDINGS:  MRI: 1. At L4-5 there is a  broad-based disc bulge. Severe bilateral facet arthropathy with ligamentum flavum infolding. Bilateral lateral recess narrowing. Mild bilateral foraminal stenosis. Mild grade 1 anterolisthesis of L4 on L5 secondary to facet disease. PATIENT SURVEYS:  FOTO    COGNITION: Overall cognitive status: Within functional limits for tasks assessed     SENSATION: Denies paresthesias   EDEMA:   MUSCLE LENGTH:  POSTURE: No Significant postural limitations  PALPATION: Tender to palpation in lower lumbar paraspinals   LOWER EXTREMITY ROM:  Active ROM Right eval Left eval  Hip flexion    Hip extension    Hip abduction    Hip adduction    Hip internal rotation    Hip external rotation    Knee flexion    Knee extension    Ankle dorsiflexion    Ankle plantarflexion    Ankle inversion    Ankle eversion     (Blank rows = not tested)  LUMBAR ROM:   Active  A/PROM  eval  Flexion   Extension   Right lateral flexion   Left lateral flexion   Right rotation   Left rotation    (Blank rows = not tested)   LOWER EXTREMITY MMT:  MMT Right eval Left eval Right  8/20 Left  8/20  Hip flexion 20.9 12.9 17.2 17.0  Hip extension      Hip abduction 26.9 29.8 24.5 28.1  Hip adduction      Hip internal rotation      Hip external rotation      Knee flexion      Knee extension 22.8 18.3 29.8 22.9  Ankle dorsiflexion      Ankle plantarflexion      Ankle inversion      Ankle eversion       (Blank rows = not tested)    GAIT: Significant lateral shift to the right with ambulation.  Decreased bilateral knee bend with ambulation.  TODAY'S TREATMENT:                                                                                                                              DATE:    9/17 Seated HSS 30 seconds x2ea Standing calf stretch 30 seconds x2 ea Nu-step L3 LTR x15  Gluteal  stretch 2x20 sec hold bilateral   Bridge 2 x 10 SLR 2x10ea Side stepping with RTB around  ankles  Step ups 6" x10 leading with R LE, unable to complete step up without pain on L LE with either 6 or 4" step.   Partial lunges x10R x3 attempts with L, but had pain  8/28 Manual: Trigger point release to lateral knee and IT band.  Grade 2 and 3 AP and PA mobilizations with distraction.  Vasopneumatic device: To the left knee, 32 degrees 15 minutes medially impression Quad set 2 x 10  Straight leg raise 2 x 10 8/21 Nu-step L4 (UE/LE)  LTR x15  Gluteal stretch 2x20 sec hold bilateral   Bridge 2 x 10  Sit to stands 2x10 Side stepping at counter with RTB at ankles x3laps  (LF) Leg press 3 x 10 10 pounds  Hip adduction machine 3 x 10 45 pounds reviewed proper set up of the machine Hip abduction machine- 35lbs 3x10  HR 3x10 Step ups fwd 6" 2x10bil Lateral step ups 4" 2x10 L      PATIENT EDUCATION:  Education details: HEP, symptom management,  Person educated: Patient Education method: Explanation, Demonstration, Tactile cues, Verbal cues, and Handouts Education comprehension: verbalized understanding, returned demonstration, verbal cues required, tactile cues required, and needs further education  HOME EXERCISE PROGRAM: Access Code: [email or web address] URL: [email or web address] Date: 07/27/2022 Prepared by: Lorayne Bender  Exercises - Supine Piriformis Stretch with Foot on Ground  - 1 x daily - 7 x weekly - 3 sets - 3 reps - 20 sec  hold - Seated Bilateral Shoulder Flexion Towel Slide at Table Top  - 1 x daily - 7 x weekly - 3 sets - 3 reps - 20 sec  hold - Seated Hamstring Stretch  - 1 x daily - 7 x weekly - 3 sets - 3 reps - 10=20 hold - Supine Bridge with Resistance Band  - 1 x daily - 7 x weekly - 3 sets - 10 reps - Supine March  - 1 x daily - 7 x weekly - 3 sets - 10 reps  ASSESSMENT:  CLINICAL IMPRESSION:  Pt with improved participation in therex program today due to reduction in L knee pain. Easing back into LE Strengthening since her recent  flare up. Pt continues to demonstrate knee hyperextension with standing exercises and with step ups. She was unable to complete fwd step up or partial lunge leading with L LE due to c/o L knee pain. Instructed her to use ice as needed if soreness occurs following today's visit. Will montior response to exercise and progress as tolerated.    OBJECTIVE IMPAIRMENTS: Abnormal gait, decreased activity tolerance, difficulty walking, decreased ROM, decreased strength, and pain.   ACTIVITY LIMITATIONS: sitting, standing, squatting, stairs, and locomotion level  PARTICIPATION LIMITATIONS: meal prep, cleaning, laundry, shopping, community activity, and yard work  PERSONAL FACTORS: 1-2 comorbidities: osteopetrosis  are also affecting patient's functional outcome.   REHAB POTENTIAL: Good  CLINICAL DECISION MAKING: Evolving/moderate complexity increased difficulty transferring from sit to stand and then ambulating  EVALUATION COMPLEXITY: Moderate   GOALS: Goals reviewed with patient? Yes  SHORT TERM GOALS: Target date: 08/17/2022   Patient will increase bilateral lower extremity strength by 5 pounds Baseline: Goal status: INITIAL  2.  Patient will report a 50% reduction in stiffness with transfer from sit to stand Baseline:  Goal status: IN PROGRESS 8/15  3.  Patient will be independent with HEP Baseline:  Goal status: IN  PROGRESS 8/15 (was out of town and did not do exercises)  LONG TERM GOALS: Target date: 09/07/2022    The patient will go up and down 6 steps with reciprocal pattern  Baseline:  Goal status: INITIAL  2.  Patient will stand and walk without stiffness  Baseline:  Goal status: INITIAL  3.  Patient will be independent with complete HEP Baseline:  Goal status: INITIAL   PLAN:  PT FREQUENCY: 1-2x/week  PT DURATION: 6 weeks  PLANNED INTERVENTIONS: Therapeutic exercises, Therapeutic activity, Neuromuscular re-education, Balance training, Gait training,  Patient/Family education, Self Care, Joint mobilization, Stair training, DME instructions, Aquatic Therapy, Dry Needling, Cryotherapy, Moist heat, and Manual therapy  PLAN FOR NEXT SESSION: Patient has a gym membership but reports she just goes for line dancing.  Patient given stretches previous visit.  Next visit focus on expanding supine exercises.  Add sit to stand transfers.  Review tolerance to stretching and HEP.  Continue with core stabilization exercises as tolerated.  Consider standing series of core stabilization as it may be easier for her to do at home rather than having to lie down.   Donnel Saxon Braylie Badami, PTA 10/05/2022, 4:59 PM

## 2022-10-12 ENCOUNTER — Ambulatory Visit (HOSPITAL_BASED_OUTPATIENT_CLINIC_OR_DEPARTMENT_OTHER): Payer: Medicare Other

## 2022-10-12 ENCOUNTER — Encounter (HOSPITAL_BASED_OUTPATIENT_CLINIC_OR_DEPARTMENT_OTHER): Payer: Self-pay

## 2022-10-12 DIAGNOSIS — R2689 Other abnormalities of gait and mobility: Secondary | ICD-10-CM

## 2022-10-12 NOTE — Therapy (Signed)
OUTPATIENT PHYSICAL THERAPY LOWER EXTREMITY TREATMENT   Patient Name: Patricia Hoover MRN: [id number] DOB:07-18-1945, 77 y.o., female Today's Date: 10/12/2022  END OF SESSION:  PT End of Session - 10/12/22 1434     Visit Number 9    Number of Visits 10    Date for PT Re-Evaluation 10/19/22    PT Start Time 1430    PT Stop Time 1515    PT Time Calculation (min) 45 min    Activity Tolerance Patient tolerated treatment well    Behavior During Therapy [facility] for tasks assessed/performed                   Past Medical History:  Diagnosis Date   Arrhythmia    possible hx, resolved prev   Blood transfusion without reported diagnosis 1972   had reaction; had to stop   Carpal tunnel syndrome    Cataract 2019   resolved with surgery   Duodenal ulcer    H/O exercise stress test 2012   normal   Heart murmur    as child   High cholesterol    History of kidney stones    Hx of colonic polyps    Hypertension    Osteoporosis 2010   t score - 3.9   Renal stones    Past Surgical History:  Procedure Laterality Date   ABDOMINAL HYSTERECTOMY  1972   for endometriosis   CATARACT EXTRACTION W/ INTRAOCULAR LENS IMPLANT Right 06/2017   CATARACT EXTRACTION W/ INTRAOCULAR LENS IMPLANT Left 07/2017   COLONOSCOPY Left 03/02/2017   Procedure: COLONOSCOPY;  Surgeon: Pasty Spillers, MD;  Location: [facility] ENDOSCOPY;  Service: Endoscopy;  Laterality: Left;   COLONOSCOPY WITH PROPOFOL N/A 05/04/2016   Procedure: COLONOSCOPY WITH PROPOFOL;  Surgeon: Wyline Mood, MD;  Location: [facility] ENDOSCOPY;  Service: Endoscopy;  Laterality: N/A;   ESOPHAGOGASTRODUODENOSCOPY Left 03/02/2017   Procedure: ESOPHAGOGASTRODUODENOSCOPY (EGD);  Surgeon: Pasty Spillers, MD;  [facility] ENDOSCOPY;  Service: Endoscopy;  Laterality: Left;   ESOPHAGOGASTRODUODENOSCOPY (EGD) WITH PROPOFOL N/A 04/08/2017   Procedure: ESOPHAGOGASTRODUODENOSCOPY (EGD) WITH PROPOFOL;  Surgeon: Pasty Spillers, MD;  Location: [facility]  ENDOSCOPY;  Service: Endoscopy;  Laterality: N/A;   LITHOTRIPSY     for kidney stone x5   LITHOTRIPSY  09/2017   Patient Active Problem List   Diagnosis Date Noted   Dysuria 04/21/2022   Stiff-legged gait 04/21/2022   Scalp lesion 09/21/2021   Clavicle enlargement 04/05/2021   Hyperpigmentation 03/01/2021   Vitamin D deficiency 03/01/2021   Leg pain 05/21/2019   Right leg pain 05/21/2019   Healthcare maintenance 01/08/2018   Paresthesia 01/08/2018   Neck pain 08/31/2017   Angioedema 06/15/2017   Peptic ulcer of duodenum    Other specified diseases of esophagus    Hematochezia    External hemorrhoids    Diverticulosis of large intestine without diverticulitis    Internal hemorrhoids    Duodenal ulceration    Advance care planning 12/23/2016   Renal stone 04/20/2016   Heartburn 12/17/2015   Medicare annual wellness visit, subsequent 06/05/2013   Hyperglycemia 06/05/2013   Knee pain 08/31/2012   Osteoporosis 08/18/2012   HTN (hypertension) 08/09/2012   HLD (hyperlipidemia) 08/09/2012    PCP: Dr Crawford Givens   REFERRING PROVIDER: Dr Crawford Givens   REFERRING DIAG:  Diagnosis  R26.89 (ICD-10-CM) - Stiff-legged gait    THERAPY DIAG:  Other abnormalities of gait and mobility  Rationale for Evaluation and Treatment: Rehabilitation  ONSET DATE:   SUBJECTIVE:  SUBJECTIVE STATEMENT: Pt reports she is doing good today. Has some knee pain in the morning and difficulty weightbearing initially, but this dissipates as she  moves around the house.   PERTINENT HISTORY: Osteoporosis 2010 t score - 3.9  Carpal tunnel syndrome   PAIN:  Are you having pain? PAIN:  Are you having pain? No   PRECAUTIONS: None  WEIGHT BEARING RESTRICTIONS: No  FALLS:  Has patient fallen in last 6 months? No  LIVING ENVIRONMENT: Has steps.   OCCUPATION:  Retired   Hobbies:   Health visitor   PLOF: Independent  PATIENT GOALS:  To be able to walk better    NEXT  MD VISIT:  Nothing scheduled   OBJECTIVE:   DIAGNOSTIC FINDINGS:  MRI: 1. At L4-5 there is a broad-based disc bulge. Severe bilateral facet arthropathy with ligamentum flavum infolding. Bilateral lateral recess narrowing. Mild bilateral foraminal stenosis. Mild grade 1 anterolisthesis of L4 on L5 secondary to facet disease. PATIENT SURVEYS:  FOTO    COGNITION: Overall cognitive status: Within functional limits for tasks assessed     SENSATION: Denies paresthesias   EDEMA:   MUSCLE LENGTH:  POSTURE: No Significant postural limitations  PALPATION: Tender to palpation in lower lumbar paraspinals   LOWER EXTREMITY ROM:  Active ROM Right eval Left eval  Hip flexion    Hip extension    Hip abduction    Hip adduction    Hip internal rotation    Hip external rotation    Knee flexion    Knee extension    Ankle dorsiflexion    Ankle plantarflexion    Ankle inversion    Ankle eversion     (Blank rows = not tested)  LUMBAR ROM:   Active  A/PROM  eval  Flexion   Extension   Right lateral flexion   Left lateral flexion   Right rotation   Left rotation    (Blank rows = not tested)   LOWER EXTREMITY MMT:  MMT Right eval Left eval Right  8/20 Left  8/20  Hip flexion 20.9 12.9 17.2 17.0  Hip extension      Hip abduction 26.9 29.8 24.5 28.1  Hip adduction      Hip internal rotation      Hip external rotation      Knee flexion      Knee extension 22.8 18.3 29.8 22.9  Ankle dorsiflexion      Ankle plantarflexion      Ankle inversion      Ankle eversion       (Blank rows = not tested)    GAIT: Significant lateral shift to the right with ambulation.  Decreased bilateral knee bend with ambulation.  TODAY'S TREATMENT:                                                                                                                              DATE:   9/24 Seated HSS 30  seconds x2ea Standing calf stretch 30 seconds x2 ea Nu-step L3 LTR x15   Gluteal stretch 2x30 sec hold bilateral  Bridge 2 x 15 SLR 2x15ea LAQ 2x10 L LE STM posterior knee x33min Partial squats x10   9/17 Seated HSS 30 seconds x2ea Standing calf stretch 30 seconds x2 ea Nu-step L3 LTR x15  Gluteal stretch 2x20 sec hold bilateral   Bridge 2 x 10 SLR 2x10ea Side stepping with RTB around ankles  Step ups 6" x10 leading with R LE, unable to complete step up without pain on L LE with either 6 or 4" step.   Partial lunges x10R x3 attempts with L, but had pain  8/28 Manual: Trigger point release to lateral knee and IT band.  Grade 2 and 3 AP and PA mobilizations with distraction.  Vasopneumatic device: To the left knee, 32 degrees 15 minutes medially impression Quad set 2 x 10  Straight leg raise 2 x 10 8/21 Nu-step L4 (UE/LE)  LTR x15  Gluteal stretch 2x20 sec hold bilateral   Bridge 2 x 10  Sit to stands 2x10 Side stepping at counter with RTB at ankles x3laps  (LF) Leg press 3 x 10 10 pounds  Hip adduction machine 3 x 10 45 pounds reviewed proper set up of the machine Hip abduction machine- 35lbs 3x10  HR 3x10 Step ups fwd 6" 2x10bil Lateral step ups 4" 2x10 L      PATIENT EDUCATION:  Education details: HEP, symptom management,  Person educated: Patient Education method: Explanation, Demonstration, Tactile cues, Verbal cues, and Handouts Education comprehension: verbalized understanding, returned demonstration, verbal cues required, tactile cues required, and needs further education  HOME EXERCISE PROGRAM: Access Code: [email or web address] URL: [email or web address] Date: 07/27/2022 Prepared by: Lorayne Bender  Exercises - Supine Piriformis Stretch with Foot on Ground  - 1 x daily - 7 x weekly - 3 sets - 3 reps - 20 sec  hold - Seated Bilateral Shoulder Flexion Towel Slide at Table Top  - 1 x daily - 7 x weekly - 3 sets - 3 reps - 20 sec  hold - Seated Hamstring Stretch  - 1 x daily - 7 x weekly - 3 sets - 3 reps -  10=20 hold - Supine Bridge with Resistance Band  - 1 x daily - 7 x weekly - 3 sets - 10 reps - Supine March  - 1 x daily - 7 x weekly - 3 sets - 10 reps  ASSESSMENT:  CLINICAL IMPRESSION:  Pt reported some posterior knee pain with LAQ, which resolved following STM to central posterior knee. She was able to complete partial squats without pain following this. Some difficulty with supine SLR on L LE compared to R LE. PN next visit.   OBJECTIVE IMPAIRMENTS: Abnormal gait, decreased activity tolerance, difficulty walking, decreased ROM, decreased strength, and pain.   ACTIVITY LIMITATIONS: sitting, standing, squatting, stairs, and locomotion level  PARTICIPATION LIMITATIONS: meal prep, cleaning, laundry, shopping, community activity, and yard work  PERSONAL FACTORS: 1-2 comorbidities: osteopetrosis  are also affecting patient's functional outcome.   REHAB POTENTIAL: Good  CLINICAL DECISION MAKING: Evolving/moderate complexity increased difficulty transferring from sit to stand and then ambulating  EVALUATION COMPLEXITY: Moderate   GOALS: Goals reviewed with patient? Yes  SHORT TERM GOALS: Target date: 08/17/2022   Patient will increase bilateral lower extremity strength by 5 pounds Baseline: Goal status: INITIAL  2.  Patient will report a 50% reduction in stiffness with transfer from sit to stand  Baseline:  Goal status: IN PROGRESS 8/15  3.  Patient will be independent with HEP Baseline:  Goal status: IN PROGRESS 8/15 (was out of town and did not do exercises)  LONG TERM GOALS: Target date: 09/07/2022    The patient will go up and down 6 steps with reciprocal pattern  Baseline:  Goal status: INITIAL  2.  Patient will stand and walk without stiffness  Baseline:  Goal status: INITIAL  3.  Patient will be independent with complete HEP Baseline:  Goal status: INITIAL   PLAN:  PT FREQUENCY: 1-2x/week  PT DURATION: 6 weeks  PLANNED INTERVENTIONS: Therapeutic  exercises, Therapeutic activity, Neuromuscular re-education, Balance training, Gait training, Patient/Family education, Self Care, Joint mobilization, Stair training, DME instructions, Aquatic Therapy, Dry Needling, Cryotherapy, Moist heat, and Manual therapy  PLAN FOR NEXT SESSION: Patient has a gym membership but reports she just goes for line dancing.  Patient given stretches previous visit.  Next visit focus on expanding supine exercises.  Add sit to stand transfers.  Review tolerance to stretching and HEP.  Continue with core stabilization exercises as tolerated.  Consider standing series of core stabilization as it may be easier for her to do at home rather than having to lie down.   Donnel Saxon Alean Kromer, PTA 10/12/2022, 4:00 PM

## 2022-10-20 ENCOUNTER — Ambulatory Visit (HOSPITAL_BASED_OUTPATIENT_CLINIC_OR_DEPARTMENT_OTHER): Payer: Medicare Other | Attending: Family Medicine | Admitting: Physical Therapy

## 2022-10-20 ENCOUNTER — Encounter (HOSPITAL_BASED_OUTPATIENT_CLINIC_OR_DEPARTMENT_OTHER): Payer: Self-pay | Admitting: Physical Therapy

## 2022-10-20 DIAGNOSIS — R2689 Other abnormalities of gait and mobility: Secondary | ICD-10-CM | POA: Insufficient documentation

## 2022-10-20 NOTE — Progress Notes (Signed)
Physician Signature: Joaquim Nam, MD  Date:_10/02/24_ Time:_1:49 PM

## 2022-10-20 NOTE — Therapy (Signed)
OUTPATIENT PHYSICAL THERAPY LOWER EXTREMITY TREATMENT/ Progress note  Progress Note Reporting Period 07/27/2022 to 10/20/2022  See note below for Objective Data and Assessment of Progress/Goals.       Patient Name: Courney Rosenkrantz MRN: [id number] DOB:[birth year], 77 y.o., female Today's Date: 10/20/2022  END OF SESSION:  PT End of Session - 10/20/22 0941     Visit Number 10    Number of Visits 20    Date for PT Re-Evaluation 11/24/22    PT Start Time 0930    PT Stop Time 1012    PT Time Calculation (min) 42 min    Activity Tolerance Patient tolerated treatment well    Behavior During Therapy [facility] for tasks assessed/performed                   Past Medical History:  Diagnosis Date   Arrhythmia    possible hx, resolved prev   Blood transfusion without reported diagnosis 1972   had reaction; had to stop   Carpal tunnel syndrome    Cataract 2019   resolved with surgery   Duodenal ulcer    H/O exercise stress test 2012   normal   Heart murmur    as child   High cholesterol    History of kidney stones    Hx of colonic polyps    Hypertension    Osteoporosis 2010   t score - 3.9   Renal stones    Past Surgical History:  Procedure Laterality Date   ABDOMINAL HYSTERECTOMY  1972   for endometriosis   CATARACT EXTRACTION W/ INTRAOCULAR LENS IMPLANT Right 06/2017   CATARACT EXTRACTION W/ INTRAOCULAR LENS IMPLANT Left 07/2017   COLONOSCOPY Left 03/02/2017   Procedure: COLONOSCOPY;  Surgeon: Pasty Spillers, MD;  Location: [facility] ENDOSCOPY;  Service: Endoscopy;  Laterality: Left;   COLONOSCOPY WITH PROPOFOL N/A 05/04/2016   Procedure: COLONOSCOPY WITH PROPOFOL;  Surgeon: Wyline Mood, MD;  Location: [facility] ENDOSCOPY;  Service: Endoscopy;  Laterality: N/A;   ESOPHAGOGASTRODUODENOSCOPY Left 03/02/2017   Procedure: ESOPHAGOGASTRODUODENOSCOPY (EGD);  Surgeon: Pasty Spillers, MD;  Location[facility] ENDOSCOPY;  Service: Endoscopy;  Laterality: Left;    ESOPHAGOGASTRODUODENOSCOPY (EGD) WITH PROPOFOL N/A 04/08/2017   Procedure: ESOPHAGOGASTRODUODENOSCOPY (EGD) WITH PROPOFOL;  Surgeon: Pasty Spillers, MD;  Location: [facility] ENDOSCOPY;  Service: Endoscopy;  Laterality: N/A;   LITHOTRIPSY     for kidney stone x5   LITHOTRIPSY  09/2017   Patient Active Problem List   Diagnosis Date Noted   Dysuria 04/21/2022   Stiff-legged gait 04/21/2022   Scalp lesion 09/21/2021   Clavicle enlargement 04/05/2021   Hyperpigmentation 03/01/2021   Vitamin D deficiency 03/01/2021   Leg pain 05/21/2019   Right leg pain 05/21/2019   Healthcare maintenance 01/08/2018   Paresthesia 01/08/2018   Neck pain 08/31/2017   Angioedema 06/15/2017   Peptic ulcer of duodenum    Other specified disease of esophagus    Hematochezia    External hemorrhoids    Diverticulosis of large intestine without diverticulitis    Internal hemorrhoids    Duodenal ulceration    Advance care planning 12/23/2016   Renal stone 04/20/2016   Heartburn 12/17/2015   Medicare annual wellness visit, subsequent 06/05/2013   Hyperglycemia 06/05/2013   Knee pain 08/31/2012   Osteoporosis 08/18/2012   HTN (hypertension) 08/09/2012   HLD (hyperlipidemia) 08/09/2012    PCP: Dr Crawford Givens   REFERRING PROVIDER: Dr Crawford Givens   REFERRING DIAG:  Diagnosis  R26.89 (ICD-10-CM) - Stiff-legged gait  THERAPY DIAG:  Other abnormalities of gait and mobility  Rationale for Evaluation and Treatment: Rehabilitation  ONSET DATE:   SUBJECTIVE:   SUBJECTIVE STATEMENT: The patient continues to have pain and stiffness in the morning. Overall she is doing OK. She feels like as the day goes on it loosens up.  PERTINENT HISTORY: Osteoporosis 2010 t score - 3.9  Carpal tunnel syndrome   PAIN:  Are you having pain? PAIN:  Are you having pain? No   PRECAUTIONS: None  WEIGHT BEARING RESTRICTIONS: No  FALLS:  Has patient fallen in last 6 months? No  LIVING ENVIRONMENT: Has  steps.   OCCUPATION:  Retired   Hobbies:   Health visitor   PLOF: Independent  PATIENT GOALS:  To be able to walk better    NEXT MD VISIT:  Nothing scheduled   OBJECTIVE:   DIAGNOSTIC FINDINGS:  MRI: 1. At L4-5 there is a broad-based disc bulge. Severe bilateral facet arthropathy with ligamentum flavum infolding. Bilateral lateral recess narrowing. Mild bilateral foraminal stenosis. Mild grade 1 anterolisthesis of L4 on L5 secondary to facet disease. PATIENT SURVEYS:  FOTO    COGNITION: Overall cognitive status: Within functional limits for tasks assessed     SENSATION: Denies paresthesias   EDEMA:   MUSCLE LENGTH:  POSTURE: No Significant postural limitations  PALPATION: Tender to palpation in lower lumbar paraspinals   LOWER EXTREMITY ROM:  Active ROM Right eval Left eval  Hip flexion    Hip extension    Hip abduction    Hip adduction    Hip internal rotation    Hip external rotation    Knee flexion    Knee extension    Ankle dorsiflexion    Ankle plantarflexion    Ankle inversion    Ankle eversion     (Blank rows = not tested)  LUMBAR ROM:   Active  A/PROM  eval  Flexion   Extension   Right lateral flexion   Left lateral flexion   Right rotation   Left rotation    (Blank rows = not tested)   LOWER EXTREMITY MMT:  MMT Right eval Left eval Right  8/20 Left  8/20 Right  Left   Hip flexion 20.9 12.9 17.2 17.0 26.9 21.9  Hip extension        Hip abduction 26.9 29.8 24.5 28.1 29.1 29.5  Hip adduction        Hip internal rotation        Hip external rotation        Knee flexion        Knee extension 22.8 18.3 29.8 22.9 27.8 17.5  Ankle dorsiflexion        Ankle plantarflexion        Ankle inversion        Ankle eversion         (Blank rows = not tested)    GAIT: Significant lateral shift to the right with ambulation.  Decreased bilateral knee bend with ambulation.  TODAY'S TREATMENT:  DATE:  10/2   Nu-step 5 min   Strength testing and review of goals   SAQ 3x10 SLR 3x10  Birdge 3x10  LAQ 2x10   Standing slow march with right LE x5 min   Manual: trigger point release to posterior knee. Patella mobilization    9/24 Seated HSS 30 seconds x2ea Standing calf stretch 30 seconds x2 ea Nu-step L3 LTR x15  Gluteal stretch 2x30 sec hold bilateral  Bridge 2 x 15 SLR 2x15ea LAQ 2x10 L LE STM posterior knee x96min Partial squats x10   9/17 Seated HSS 30 seconds x2ea Standing calf stretch 30 seconds x2 ea Nu-step L3 LTR x15  Gluteal stretch 2x20 sec hold bilateral   Bridge 2 x 10 SLR 2x10ea Side stepping with RTB around ankles  Step ups 6" x10 leading with R LE, unable to complete step up without pain on L LE with either 6 or 4" step.   Partial lunges x10R x3 attempts with L, but had pain  8/28 Manual: Trigger point release to lateral knee and IT band.  Grade 2 and 3 AP and PA mobilizations with distraction.  Vasopneumatic device: To the left knee, 32 degrees 15 minutes medially impression Quad set 2 x 10  Straight leg raise 2 x 10 8/21 Nu-step L4 (UE/LE)  LTR x15  Gluteal stretch 2x20 sec hold bilateral   Bridge 2 x 10  Sit to stands 2x10 Side stepping at counter with RTB at ankles x3laps  (LF) Leg press 3 x 10 10 pounds  Hip adduction machine 3 x 10 45 pounds reviewed proper set up of the machine Hip abduction machine- 35lbs 3x10  HR 3x10 Step ups fwd 6" 2x10bil Lateral step ups 4" 2x10 L      PATIENT EDUCATION:  Education details: HEP, symptom management,  Person educated: Patient Education method: Explanation, Demonstration, Tactile cues, Verbal cues, and Handouts Education comprehension: verbalized understanding, returned demonstration, verbal cues required, tactile cues required, and needs further education  HOME  EXERCISE PROGRAM: Access Code: [email or web address] URL: [email or web address] Date: 07/27/2022 Prepared by: Lorayne Bender  Exercises - Supine Piriformis Stretch with Foot on Ground  - 1 x daily - 7 x weekly - 3 sets - 3 reps - 20 sec  hold - Seated Bilateral Shoulder Flexion Towel Slide at Table Top  - 1 x daily - 7 x weekly - 3 sets - 3 reps - 20 sec  hold - Seated Hamstring Stretch  - 1 x daily - 7 x weekly - 3 sets - 3 reps - 10=20 hold - Supine Bridge with Resistance Band  - 1 x daily - 7 x weekly - 3 sets - 10 reps - Supine March  - 1 x daily - 7 x weekly - 3 sets - 10 reps  ASSESSMENT:  CLINICAL IMPRESSION:  The patient is making progress with her knee. Her upper leg and back have resolved. She continues to have stiffness in the morning. She has returned to line dancing but not full. She is only using the cane in the morning. Her strength numbers have improved. She would benefit from further skilled therapy 2W5 to improve ability to ambulate without a cane and to return to her exercise class. See below for goal specific progress.    OBJECTIVE IMPAIRMENTS: Abnormal gait, decreased activity tolerance, difficulty walking, decreased ROM, decreased strength, and pain.   ACTIVITY LIMITATIONS: sitting, standing, squatting, stairs, and locomotion level  PARTICIPATION LIMITATIONS: meal prep, cleaning, laundry, shopping, community activity,  and yard work  PERSONAL FACTORS: 1-2 comorbidities: osteopetrosis  are also affecting patient's functional outcome.   REHAB POTENTIAL: Good  CLINICAL DECISION MAKING: Evolving/moderate complexity increased difficulty transferring from sit to stand and then ambulating  EVALUATION COMPLEXITY: Moderate   GOALS: Goals reviewed with patient? Yes  SHORT TERM GOALS: Target date: 08/17/2022   Patient will increase bilateral lower extremity strength by 5 pounds Baseline: Goal status: INITIAL  2.  Patient will report a 50% reduction in stiffness  with transfer from sit to stand Baseline:  Goal status: IN PROGRESS 8/15  3.  Patient will be independent with HEP Baseline:  Goal status: IN PROGRESS 8/15 (was out of town and did not do exercises)  LONG TERM GOALS: Target date: 09/07/2022    The patient will go up and down 6 steps with reciprocal pattern  Baseline:  Goal status: INITIAL  2.  Patient will stand and walk without stiffness  Baseline:  Goal status: INITIAL  3.  Patient will be independent with complete HEP Baseline:  Goal status: INITIAL   PLAN:  PT FREQUENCY: 1-2x/week  PT DURATION: 6 weeks  PLANNED INTERVENTIONS: Therapeutic exercises, Therapeutic activity, Neuromuscular re-education, Balance training, Gait training, Patient/Family education, Self Care, Joint mobilization, Stair training, DME instructions, Aquatic Therapy, Dry Needling, Cryotherapy, Moist heat, and Manual therapy  PLAN FOR NEXT SESSION: Patient has a gym membership but reports she just goes for line dancing.  Patient given stretches previous visit.  Next visit focus on expanding supine exercises.  Add sit to stand transfers.  Review tolerance to stretching and HEP.  Continue with core stabilization exercises as tolerated.  Consider standing series of core stabilization as it may be easier for her to do at home rather than having to lie down.   Dessie Coma, PT 10/20/2022, 11:26 AM

## 2022-10-22 ENCOUNTER — Encounter (HOSPITAL_BASED_OUTPATIENT_CLINIC_OR_DEPARTMENT_OTHER): Payer: Self-pay

## 2022-10-22 ENCOUNTER — Ambulatory Visit (HOSPITAL_BASED_OUTPATIENT_CLINIC_OR_DEPARTMENT_OTHER): Payer: Medicare Other

## 2022-10-22 DIAGNOSIS — R2689 Other abnormalities of gait and mobility: Secondary | ICD-10-CM | POA: Diagnosis not present

## 2022-10-22 NOTE — Therapy (Signed)
OUTPATIENT PHYSICAL THERAPY LOWER EXTREMITY TREATMENT/ Progress note  Progress Note Reporting Period 07/27/2022 to 10/20/2022  See note below for Objective Data and Assessment of Progress/Goals.       Patient Name: Patricia Hoover MRN: [id number] DOB:Jul 07, 1945, 77 y.o., female Today's Date: 10/22/2022  END OF SESSION:  PT End of Session - 10/22/22 1654     Visit Number 11    Number of Visits 20    Date for PT Re-Evaluation 11/24/22    PT Start Time 1603    PT Stop Time 1647    PT Time Calculation (min) 44 min    Activity Tolerance Patient tolerated treatment well    Behavior During Therapy [facility] for tasks assessed/performed                    Past Medical History:  Diagnosis Date   Arrhythmia    possible hx, resolved prev   Blood transfusion without reported diagnosis 1972   had reaction; had to stop   Carpal tunnel syndrome    Cataract 2019   resolved with surgery   Duodenal ulcer    H/O exercise stress test 2012   normal   Heart murmur    as child   High cholesterol    History of kidney stones    Hx of colonic polyps    Hypertension    Osteoporosis 2010   t score - 3.9   Renal stones    Past Surgical History:  Procedure Laterality Date   ABDOMINAL HYSTERECTOMY  1972   for endometriosis   CATARACT EXTRACTION W/ INTRAOCULAR LENS IMPLANT Right 06/2017   CATARACT EXTRACTION W/ INTRAOCULAR LENS IMPLANT Left 07/2017   COLONOSCOPY Left 03/02/2017   Procedure: COLONOSCOPY;  Surgeon: Pasty Spillers, MD;  Location: [facility] ENDOSCOPY;  Service: Endoscopy;  Laterality: Left;   COLONOSCOPY WITH PROPOFOL N/A 05/04/2016   Procedure: COLONOSCOPY WITH PROPOFOL;  Surgeon: Wyline Mood, MD;  Location: [facility] ENDOSCOPY;  Service: Endoscopy;  Laterality: N/A;   ESOPHAGOGASTRODUODENOSCOPY Left 03/02/2017   Procedure: ESOPHAGOGASTRODUODENOSCOPY (EGD);  Surgeon: Pasty Spillers, MD;  Location: [facility] ENDOSCOPY;  Service: Endoscopy;  Laterality: Left;    ESOPHAGOGASTRODUODENOSCOPY (EGD) WITH PROPOFOL N/A 04/08/2017   Procedure: ESOPHAGOGASTRODUODENOSCOPY (EGD) WITH PROPOFOL;  Surgeon: Pasty Spillers, MD;  Location: [facility] ENDOSCOPY;  Service: Endoscopy;  Laterality: N/A;   LITHOTRIPSY     for kidney stone x5   LITHOTRIPSY  09/2017   Patient Active Problem List   Diagnosis Date Noted   Dysuria 04/21/2022   Stiff-legged gait 04/21/2022   Scalp lesion 09/21/2021   Clavicle enlargement 04/05/2021   Hyperpigmentation 03/01/2021   Vitamin D deficiency 03/01/2021   Leg pain 05/21/2019   Right leg pain 05/21/2019   Healthcare maintenance 01/08/2018   Paresthesia 01/08/2018   Neck pain 08/31/2017   Angioedema 06/15/2017   Peptic ulcer of duodenum    Other specified disease of esophagus    Hematochezia    External hemorrhoids    Diverticulosis of large intestine without diverticulitis    Internal hemorrhoids    Duodenal ulceration    Advance care planning 12/23/2016   Renal stone 04/20/2016   Heartburn 12/17/2015   Medicare annual wellness visit, subsequent 06/05/2013   Hyperglycemia 06/05/2013   Knee pain 08/31/2012   Osteoporosis 08/18/2012   HTN (hypertension) 08/09/2012   HLD (hyperlipidemia) 08/09/2012    PCP: Dr Crawford Givens   REFERRING PROVIDER: Dr Crawford Givens   REFERRING DIAG:  Diagnosis  R26.89 (ICD-10-CM) - Stiff-legged gait  THERAPY DIAG:  Other abnormalities of gait and mobility  Rationale for Evaluation and Treatment: Rehabilitation  ONSET DATE:   SUBJECTIVE:   SUBJECTIVE STATEMENT: The patient reports pain is improving and she is able to walk better. Does have stiffness in knee after sitting in waiting area.     PERTINENT HISTORY: Osteoporosis 2010 t score - 3.9  Carpal tunnel syndrome   PAIN:  Are you having pain? PAIN:  Are you having pain? No   PRECAUTIONS: None  WEIGHT BEARING RESTRICTIONS: No  FALLS:  Has patient fallen in last 6 months? No  LIVING ENVIRONMENT: Has steps.    OCCUPATION:  Retired   Hobbies:   Health visitor   PLOF: Independent  PATIENT GOALS:  To be able to walk better    NEXT MD VISIT:  Nothing scheduled   OBJECTIVE:   DIAGNOSTIC FINDINGS:  MRI: 1. At L4-5 there is a broad-based disc bulge. Severe bilateral facet arthropathy with ligamentum flavum infolding. Bilateral lateral recess narrowing. Mild bilateral foraminal stenosis. Mild grade 1 anterolisthesis of L4 on L5 secondary to facet disease. PATIENT SURVEYS:  FOTO    COGNITION: Overall cognitive status: Within functional limits for tasks assessed     SENSATION: Denies paresthesias   EDEMA:   MUSCLE LENGTH:  POSTURE: No Significant postural limitations  PALPATION: Tender to palpation in lower lumbar paraspinals   LOWER EXTREMITY ROM:  Active ROM Right eval Left eval  Hip flexion    Hip extension    Hip abduction    Hip adduction    Hip internal rotation    Hip external rotation    Knee flexion    Knee extension    Ankle dorsiflexion    Ankle plantarflexion    Ankle inversion    Ankle eversion     (Blank rows = not tested)  LUMBAR ROM:   Active  A/PROM  eval  Flexion   Extension   Right lateral flexion   Left lateral flexion   Right rotation   Left rotation    (Blank rows = not tested)   LOWER EXTREMITY MMT:  MMT Right eval Left eval Right  8/20 Left  8/20 Right  Left   Hip flexion 20.9 12.9 17.2 17.0 26.9 21.9  Hip extension        Hip abduction 26.9 29.8 24.5 28.1 29.1 29.5  Hip adduction        Hip internal rotation        Hip external rotation        Knee flexion        Knee extension 22.8 18.3 29.8 22.9 27.8 17.5  Ankle dorsiflexion        Ankle plantarflexion        Ankle inversion        Ankle eversion         (Blank rows = not tested)    GAIT: Significant lateral shift to the right with ambulation.  Decreased bilateral knee bend with ambulation.  TODAY'S TREATMENT:  DATE:   10/4   Nu-step 5 min L5  LAQ 2x10 2.5lbs bil HSC RTB 2x10L Sit to stands 2x10 Step ups- bottom stair x10ea (cues for knee flexion) Side stepping with squat at rail with RTB at ankles x1 lap Walking marches 1/2 hall x2 Standing marches 2x10 [place] on floor no UE support Airex ant/post wgt shifts x10  10/2   Nu-step 5 min   Strength testing and review of goals   SAQ 3x10 SLR 3x10  Birdge 3x10  LAQ 2x10   Standing slow march with right LE x5 min   Manual: trigger point release to posterior knee. Patella mobilization    9/24 Seated HSS 30 seconds x2ea Standing calf stretch 30 seconds x2 ea Nu-step L3 LTR x15  Gluteal stretch 2x30 sec hold bilateral  Bridge 2 x 15 SLR 2x15ea LAQ 2x10 L LE STM posterior knee x71min Partial squats x10   9/17 Seated HSS 30 seconds x2ea Standing calf stretch 30 seconds x2 ea Nu-step L3 LTR x15  Gluteal stretch 2x20 sec hold bilateral   Bridge 2 x 10 SLR 2x10ea Side stepping with RTB around ankles  Step ups 6" x10 leading with R LE, unable to complete step up without pain on L LE with either 6 or 4" step.   Partial lunges x10R x3 attempts with L, but had pain  8/28 Manual: Trigger point release to lateral knee and IT band.  Grade 2 and 3 AP and PA mobilizations with distraction.  Vasopneumatic device: To the left knee, 32 degrees 15 minutes medially impression Quad set 2 x 10  Straight leg raise 2 x 10 8/21 Nu-step L4 (UE/LE)  LTR x15  Gluteal stretch 2x20 sec hold bilateral   Bridge 2 x 10  Sit to stands 2x10 Side stepping at counter with RTB at ankles x3laps  (LF) Leg press 3 x 10 10 pounds  Hip adduction machine 3 x 10 45 pounds reviewed proper set up of the machine Hip abduction machine- 35lbs 3x10  HR 3x10 Step ups fwd 6" 2x10bil Lateral step ups 4" 2x10 L      PATIENT EDUCATION:   Education details: HEP, symptom management,  Person educated: Patient Education method: Explanation, Demonstration, Tactile cues, Verbal cues, and Handouts Education comprehension: verbalized understanding, returned demonstration, verbal cues required, tactile cues required, and needs further education  HOME EXERCISE PROGRAM: Access Code: [email or web address] URL: [email or web address] Date: 07/27/2022 Prepared by: Lorayne Bender  Exercises - Supine Piriformis Stretch with Foot on Ground  - 1 x daily - 7 x weekly - 3 sets - 3 reps - 20 sec  hold - Seated Bilateral Shoulder Flexion Towel Slide at Table Top  - 1 x daily - 7 x weekly - 3 sets - 3 reps - 20 sec  hold - Seated Hamstring Stretch  - 1 x daily - 7 x weekly - 3 sets - 3 reps - 10=20 hold - Supine Bridge with Resistance Band  - 1 x daily - 7 x weekly - 3 sets - 10 reps - Supine March  - 1 x daily - 7 x weekly - 3 sets - 10 reps  ASSESSMENT:  CLINICAL IMPRESSION:  Worked on gait training without AD today. Pt demonstrates some knee hyperextension with ambulation, though improved knee flexion. Does have mild unsteadiness due to balance deficits. She was able to progress with strengthening today, adding sit to stands and step ups at 6" stair.  No tenderness to posterior knee upon assessment. Will  continue to progress functional strength as tolerated in order to meet goals.   OBJECTIVE IMPAIRMENTS: Abnormal gait, decreased activity tolerance, difficulty walking, decreased ROM, decreased strength, and pain.   ACTIVITY LIMITATIONS: sitting, standing, squatting, stairs, and locomotion level  PARTICIPATION LIMITATIONS: meal prep, cleaning, laundry, shopping, community activity, and yard work  PERSONAL FACTORS: 1-2 comorbidities: osteopetrosis  are also affecting patient's functional outcome.   REHAB POTENTIAL: Good  CLINICAL DECISION MAKING: Evolving/moderate complexity increased difficulty transferring from sit to stand and then  ambulating  EVALUATION COMPLEXITY: Moderate   GOALS: Goals reviewed with patient? Yes  SHORT TERM GOALS: Target date: 08/17/2022   Patient will increase bilateral lower extremity strength by 5 pounds Baseline: Goal status: INITIAL  2.  Patient will report a 50% reduction in stiffness with transfer from sit to stand Baseline:  Goal status: IN PROGRESS 8/15  3.  Patient will be independent with HEP Baseline:  Goal status: IN PROGRESS 8/15 (was out of town and did not do exercises)  LONG TERM GOALS: Target date: 09/07/2022    The patient will go up and down 6 steps with reciprocal pattern  Baseline:  Goal status: INITIAL  2.  Patient will stand and walk without stiffness  Baseline:  Goal status: INITIAL  3.  Patient will be independent with complete HEP Baseline:  Goal status: INITIAL   PLAN:  PT FREQUENCY: 1-2x/week  PT DURATION: 6 weeks  PLANNED INTERVENTIONS: Therapeutic exercises, Therapeutic activity, Neuromuscular re-education, Balance training, Gait training, Patient/Family education, Self Care, Joint mobilization, Stair training, DME instructions, Aquatic Therapy, Dry Needling, Cryotherapy, Moist heat, and Manual therapy  PLAN FOR NEXT SESSION: Patient has a gym membership but reports she just goes for line dancing.  Patient given stretches previous visit.  Next visit focus on expanding supine exercises.  Add sit to stand transfers.  Review tolerance to stretching and HEP.  Continue with core stabilization exercises as tolerated.  Consider standing series of core stabilization as it may be easier for her to do at home rather than having to lie down.   Donnel Saxon Surabhi Gadea, PTA 10/22/2022, 4:55 PM

## 2022-10-25 DIAGNOSIS — Z23 Encounter for immunization: Secondary | ICD-10-CM | POA: Diagnosis not present

## 2022-10-27 ENCOUNTER — Ambulatory Visit (HOSPITAL_BASED_OUTPATIENT_CLINIC_OR_DEPARTMENT_OTHER): Payer: Medicare Other

## 2022-10-27 ENCOUNTER — Encounter (HOSPITAL_BASED_OUTPATIENT_CLINIC_OR_DEPARTMENT_OTHER): Payer: Self-pay

## 2022-10-27 DIAGNOSIS — R2689 Other abnormalities of gait and mobility: Secondary | ICD-10-CM | POA: Diagnosis not present

## 2022-10-27 NOTE — Therapy (Signed)
OUTPATIENT PHYSICAL THERAPY LOWER EXTREMITY TREATMENT        Patient Name: Patricia Hoover MRN: [id number] DOB:[birth year], 77 y.o., female Today's Date: 10/27/2022  END OF SESSION:  PT End of Session - 10/27/22 1444     Visit Number 12    Number of Visits 20    Date for PT Re-Evaluation 11/24/22    PT Start Time 1418    PT Stop Time 1503    PT Time Calculation (min) 45 min    Activity Tolerance Patient tolerated treatment well    Behavior During Therapy [facility] for tasks assessed/performed                     Past Medical History:  Diagnosis Date   Arrhythmia    possible hx, resolved prev   Blood transfusion without reported diagnosis 1972   had reaction; had to stop   Carpal tunnel syndrome    Cataract 2019   resolved with surgery   Duodenal ulcer    H/O exercise stress test 2012   normal   Heart murmur    as child   High cholesterol    History of kidney stones    Hx of colonic polyps    Hypertension    Osteoporosis 2010   t score - 3.9   Renal stones    Past Surgical History:  Procedure Laterality Date   ABDOMINAL HYSTERECTOMY  1972   for endometriosis   CATARACT EXTRACTION W/ INTRAOCULAR LENS IMPLANT Right 06/2017   CATARACT EXTRACTION W/ INTRAOCULAR LENS IMPLANT Left 07/2017   COLONOSCOPY Left 03/02/2017   Procedure: COLONOSCOPY;  Surgeon: Pasty Spillers, MD;  Location: [facility] ENDOSCOPY;  Service: Endoscopy;  Laterality: Left;   COLONOSCOPY WITH PROPOFOL N/A 05/04/2016   Procedure: COLONOSCOPY WITH PROPOFOL;  Surgeon: Wyline Mood, MD;  Location: [facility] ENDOSCOPY;  Service: Endoscopy;  Laterality: N/A;   ESOPHAGOGASTRODUODENOSCOPY Left 03/02/2017   Procedure: ESOPHAGOGASTRODUODENOSCOPY (EGD);  Surgeon: Pasty Spillers, MD;  Location: [facility] ENDOSCOPY;  Service: Endoscopy;  Laterality: Left;   ESOPHAGOGASTRODUODENOSCOPY (EGD) WITH PROPOFOL N/A 04/08/2017   Procedure: ESOPHAGOGASTRODUODENOSCOPY (EGD) WITH PROPOFOL;  Surgeon: Pasty Spillers, MD;   Location: [facility] ENDOSCOPY;  Service: Endoscopy;  Laterality: N/A;   LITHOTRIPSY     for kidney stone x5   LITHOTRIPSY  09/2017   Patient Active Problem List   Diagnosis Date Noted   Dysuria 04/21/2022   Stiff-legged gait 04/21/2022   Scalp lesion 09/21/2021   Clavicle enlargement 04/05/2021   Hyperpigmentation 03/01/2021   Vitamin D deficiency 03/01/2021   Leg pain 05/21/2019   Right leg pain 05/21/2019   Healthcare maintenance 01/08/2018   Paresthesia 01/08/2018   Neck pain 08/31/2017   Angioedema 06/15/2017   Peptic ulcer of duodenum    Other specified disease of esophagus    Hematochezia    External hemorrhoids    Diverticulosis of large intestine without diverticulitis    Internal hemorrhoids    Duodenal ulceration    Advance care planning 12/23/2016   Renal stone 04/20/2016   Heartburn 12/17/2015   Medicare annual wellness visit, subsequent 06/05/2013   Hyperglycemia 06/05/2013   Knee pain 08/31/2012   Osteoporosis 08/18/2012   HTN (hypertension) 08/09/2012   HLD (hyperlipidemia) 08/09/2012    PCP: Dr Crawford Givens   REFERRING PROVIDER: Dr Crawford Givens   REFERRING DIAG:  Diagnosis  R26.89 (ICD-10-CM) - Stiff-legged gait    THERAPY DIAG:  Other abnormalities of gait and mobility  Rationale for Evaluation and Treatment: Rehabilitation  ONSET DATE:   SUBJECTIVE:   SUBJECTIVE STATEMENT: Pt reports improved stiffness. No reoccurrence of posterior knee pain. Ambulates into clinic without SPC, but brings with her in purse.    PERTINENT HISTORY: Osteoporosis 2010 t score - 3.9  Carpal tunnel syndrome   PAIN:  Are you having pain? PAIN:  Are you having pain? No   PRECAUTIONS: None  WEIGHT BEARING RESTRICTIONS: No  FALLS:  Has patient fallen in last 6 months? No  LIVING ENVIRONMENT: Has steps.   OCCUPATION:  Retired   Hobbies:   Health visitor   PLOF: Independent  PATIENT GOALS:  To be able to walk better    NEXT MD VISIT:   Nothing scheduled   OBJECTIVE:   DIAGNOSTIC FINDINGS:  MRI: 1. At L4-5 there is a broad-based disc bulge. Severe bilateral facet arthropathy with ligamentum flavum infolding. Bilateral lateral recess narrowing. Mild bilateral foraminal stenosis. Mild grade 1 anterolisthesis of L4 on L5 secondary to facet disease. PATIENT SURVEYS:  FOTO    COGNITION: Overall cognitive status: Within functional limits for tasks assessed     SENSATION: Denies paresthesias   EDEMA:   MUSCLE LENGTH:  POSTURE: No Significant postural limitations  PALPATION: Tender to palpation in lower lumbar paraspinals   LOWER EXTREMITY ROM:  Active ROM Right eval Left eval  Hip flexion    Hip extension    Hip abduction    Hip adduction    Hip internal rotation    Hip external rotation    Knee flexion    Knee extension    Ankle dorsiflexion    Ankle plantarflexion    Ankle inversion    Ankle eversion     (Blank rows = not tested)  LUMBAR ROM:   Active  A/PROM  eval  Flexion   Extension   Right lateral flexion   Left lateral flexion   Right rotation   Left rotation    (Blank rows = not tested)   LOWER EXTREMITY MMT:  MMT Right eval Left eval Right  8/20 Left  8/20 Right  Left   Hip flexion 20.9 12.9 17.2 17.0 26.9 21.9  Hip extension        Hip abduction 26.9 29.8 24.5 28.1 29.1 29.5  Hip adduction        Hip internal rotation        Hip external rotation        Knee flexion        Knee extension 22.8 18.3 29.8 22.9 27.8 17.5  Ankle dorsiflexion        Ankle plantarflexion        Ankle inversion        Ankle eversion         (Blank rows = not tested)    GAIT: Significant lateral shift to the right with ambulation.  Decreased bilateral knee bend with ambulation.  TODAY'S TREATMENT:  DATE:   10/9  Nu-step 5 min L4  LAQ 2x10 2.5lbs bil HSC  RTB 2x10L Sit to stands 2x10 from chair Step ups- bottom stair 2x10ea (cues for knee flexion with eccentric control) Side stepping with squat at rail with RTB at ankles x3 laps Walking marches 1/2 hall x2 Standing marches 2x10 [place] on floor no UE support Airex ant/post wgt shifts x10   10/4   Nu-step 5 min L5  LAQ 2x10 2.5lbs bil HSC RTB 2x10L Sit to stands 2x10 Step ups- bottom stair x10ea (cues for knee flexion) Side stepping with squat at rail with RTB at ankles x1 lap Walking marches 1/2 hall x2 Standing marches 2x10 [place] on floor no UE support Airex ant/post wgt shifts x10  10/2   Nu-step 5 min   Strength testing and review of goals   SAQ 3x10 SLR 3x10  Birdge 3x10  LAQ 2x10   Standing slow march with right LE x5 min   Manual: trigger point release to posterior knee. Patella mobilization    9/24 Seated HSS 30 seconds x2ea Standing calf stretch 30 seconds x2 ea Nu-step L3 LTR x15  Gluteal stretch 2x30 sec hold bilateral  Bridge 2 x 15 SLR 2x15ea LAQ 2x10 L LE STM posterior knee x80min Partial squats x10   9/17 Seated HSS 30 seconds x2ea Standing calf stretch 30 seconds x2 ea Nu-step L3 LTR x15  Gluteal stretch 2x20 sec hold bilateral   Bridge 2 x 10 SLR 2x10ea Side stepping with RTB around ankles  Step ups 6" x10 leading with R LE, unable to complete step up without pain on L LE with either 6 or 4" step.   Partial lunges x10R x3 attempts with L, but had pain  8/28 Manual: Trigger point release to lateral knee and IT band.  Grade 2 and 3 AP and PA mobilizations with distraction.  Vasopneumatic device: To the left knee, 32 degrees 15 minutes medially impression Quad set 2 x 10  Straight leg raise 2 x 10 8/21 Nu-step L4 (UE/LE)  LTR x15  Gluteal stretch 2x20 sec hold bilateral   Bridge 2 x 10  Sit to stands 2x10 Side stepping at counter with RTB at ankles x3laps  (LF) Leg press 3 x 10 10 pounds  Hip adduction  machine 3 x 10 45 pounds reviewed proper set up of the machine Hip abduction machine- 35lbs 3x10  HR 3x10 Step ups fwd 6" 2x10bil Lateral step ups 4" 2x10 L      PATIENT EDUCATION:  Education details: HEP, symptom management,  Person educated: Patient Education method: Explanation, Demonstration, Tactile cues, Verbal cues, and Handouts Education comprehension: verbalized understanding, returned demonstration, verbal cues required, tactile cues required, and needs further education  HOME EXERCISE PROGRAM: Access Code: [email or web address] URL: [email or web address] Date: 07/27/2022 Prepared by: Lorayne Bender  Exercises - Supine Piriformis Stretch with Foot on Ground  - 1 x daily - 7 x weekly - 3 sets - 3 reps - 20 sec  hold - Seated Bilateral Shoulder Flexion Towel Slide at Table Top  - 1 x daily - 7 x weekly - 3 sets - 3 reps - 20 sec  hold - Seated Hamstring Stretch  - 1 x daily - 7 x weekly - 3 sets - 3 reps - 10=20 hold - Supine Bridge with Resistance Band  - 1 x daily - 7 x weekly - 3 sets - 10 reps - Supine March  - 1 x daily - 7 x  weekly - 3 sets - 10 reps  ASSESSMENT:  CLINICAL IMPRESSION:  Excellent tolerance for closed chain strengthening, able to increase reps with step ups at 6" stair in clinic. Initiated eccentric heel taps from floor level, working towards stair goal. Some difficulty with this as well as L  knee discomfort. Will continue to progress strengthening as tolerated.   OBJECTIVE IMPAIRMENTS: Abnormal gait, decreased activity tolerance, difficulty walking, decreased ROM, decreased strength, and pain.   ACTIVITY LIMITATIONS: sitting, standing, squatting, stairs, and locomotion level  PARTICIPATION LIMITATIONS: meal prep, cleaning, laundry, shopping, community activity, and yard work  PERSONAL FACTORS: 1-2 comorbidities: osteopetrosis  are also affecting patient's functional outcome.   REHAB POTENTIAL: Good  CLINICAL DECISION MAKING:  Evolving/moderate complexity increased difficulty transferring from sit to stand and then ambulating  EVALUATION COMPLEXITY: Moderate   GOALS: Goals reviewed with patient? Yes  SHORT TERM GOALS: Target date: 08/17/2022   Patient will increase bilateral lower extremity strength by 5 pounds Baseline: Goal status: INITIAL  2.  Patient will report a 50% reduction in stiffness with transfer from sit to stand Baseline:  Goal status: IN PROGRESS 8/15  3.  Patient will be independent with HEP Baseline:  Goal status: MET 10/9  LONG TERM GOALS: Target date: 09/07/2022    The patient will go up and down 6 steps with reciprocal pattern  Baseline:  Goal status: IN PROGRESS 10/9  2.  Patient will stand and walk without stiffness  Baseline:  Goal status: IN PROGRESS 10/9  3.  Patient will be independent with complete HEP Baseline:  Goal status: INITIAL   PLAN:  PT FREQUENCY: 1-2x/week  PT DURATION: 6 weeks  PLANNED INTERVENTIONS: Therapeutic exercises, Therapeutic activity, Neuromuscular re-education, Balance training, Gait training, Patient/Family education, Self Care, Joint mobilization, Stair training, DME instructions, Aquatic Therapy, Dry Needling, Cryotherapy, Moist heat, and Manual therapy  PLAN FOR NEXT SESSION: Patient has a gym membership but reports she just goes for line dancing.  Patient given stretches previous visit.  Next visit focus on expanding supine exercises.  Add sit to stand transfers.  Review tolerance to stretching and HEP.  Continue with core stabilization exercises as tolerated.  Consider standing series of core stabilization as it may be easier for her to do at home rather than having to lie down.   Donnel Saxon Delio Slates, PTA 10/27/2022, 3:07 PM

## 2022-10-29 ENCOUNTER — Ambulatory Visit (HOSPITAL_BASED_OUTPATIENT_CLINIC_OR_DEPARTMENT_OTHER): Payer: Medicare Other

## 2022-10-29 ENCOUNTER — Encounter (HOSPITAL_BASED_OUTPATIENT_CLINIC_OR_DEPARTMENT_OTHER): Payer: Self-pay

## 2022-10-29 DIAGNOSIS — R2689 Other abnormalities of gait and mobility: Secondary | ICD-10-CM | POA: Diagnosis not present

## 2022-10-29 NOTE — Therapy (Signed)
OUTPATIENT PHYSICAL THERAPY LOWER EXTREMITY TREATMENT        Patient Name: Patricia Hoover MRN: [id number] DOB:10-Mar-1945, 77 y.o., female Today's Date: 10/29/2022  END OF SESSION:  PT End of Session - 10/29/22 1552     Visit Number 13    Number of Visits 20    Date for PT Re-Evaluation 11/24/22    PT Start Time 1603    PT Stop Time 1645    PT Time Calculation (min) 42 min    Activity Tolerance Patient tolerated treatment well    Behavior During Therapy [facility] for tasks assessed/performed                      Past Medical History:  Diagnosis Date   Arrhythmia    possible hx, resolved prev   Blood transfusion without reported diagnosis 1972   had reaction; had to stop   Carpal tunnel syndrome    Cataract 2019   resolved with surgery   Duodenal ulcer    H/O exercise stress test 2012   normal   Heart murmur    as child   High cholesterol    History of kidney stones    Hx of colonic polyps    Hypertension    Osteoporosis 2010   t score - 3.9   Renal stones    Past Surgical History:  Procedure Laterality Date   ABDOMINAL HYSTERECTOMY  1972   for endometriosis   CATARACT EXTRACTION W/ INTRAOCULAR LENS IMPLANT Right 06/2017   CATARACT EXTRACTION W/ INTRAOCULAR LENS IMPLANT Left 07/2017   COLONOSCOPY Left 03/02/2017   Procedure: COLONOSCOPY;  Surgeon: Pasty Spillers, MD;  Location: [facility] ENDOSCOPY;  Service: Endoscopy;  Laterality: Left;   COLONOSCOPY WITH PROPOFOL N/A 05/04/2016   Procedure: COLONOSCOPY WITH PROPOFOL;  Surgeon: Wyline Mood, MD;  Location: [facility] ENDOSCOPY;  Service: Endoscopy;  Laterality: N/A;   ESOPHAGOGASTRODUODENOSCOPY Left 03/02/2017   Procedure: ESOPHAGOGASTRODUODENOSCOPY (EGD);  Surgeon: Pasty Spillers, MD;  Location: [facility] ENDOSCOPY;  Service: Endoscopy;  Laterality: Left;   ESOPHAGOGASTRODUODENOSCOPY (EGD) WITH PROPOFOL N/A 04/08/2017   Procedure: ESOPHAGOGASTRODUODENOSCOPY (EGD) WITH PROPOFOL;  Surgeon: Pasty Spillers, MD;   Location: [facility] ENDOSCOPY;  Service: Endoscopy;  Laterality: N/A;   LITHOTRIPSY     for kidney stone x5   LITHOTRIPSY  09/2017   Patient Active Problem List   Diagnosis Date Noted   Dysuria 04/21/2022   Stiff-legged gait 04/21/2022   Scalp lesion 09/21/2021   Clavicle enlargement 04/05/2021   Hyperpigmentation 03/01/2021   Vitamin D deficiency 03/01/2021   Leg pain 05/21/2019   Right leg pain 05/21/2019   Healthcare maintenance 01/08/2018   Paresthesia 01/08/2018   Neck pain 08/31/2017   Angioedema 06/15/2017   Peptic ulcer of duodenum    Other specified disease of esophagus    Hematochezia    External hemorrhoids    Diverticulosis of large intestine without diverticulitis    Internal hemorrhoids    Duodenal ulceration    Advance care planning 12/23/2016   Renal stone 04/20/2016   Heartburn 12/17/2015   Medicare annual wellness visit, subsequent 06/05/2013   Hyperglycemia 06/05/2013   Knee pain 08/31/2012   Osteoporosis 08/18/2012   HTN (hypertension) 08/09/2012   HLD (hyperlipidemia) 08/09/2012    PCP: Dr Crawford Givens   REFERRING PROVIDER: Dr Crawford Givens   REFERRING DIAG:  Diagnosis  R26.89 (ICD-10-CM) - Stiff-legged gait    THERAPY DIAG:  Other abnormalities of gait and mobility  Rationale for Evaluation and Treatment: Rehabilitation  ONSET DATE:   SUBJECTIVE:   SUBJECTIVE STATEMENT: Some pain in knee with walking. Felt worse when waking up this morning. Had to use cane today and brings with her to PT.     PERTINENT HISTORY: Osteoporosis 2010 t score - 3.9  Carpal tunnel syndrome   PAIN:  Are you having pain? PAIN:  Are you having pain? No   PRECAUTIONS: None  WEIGHT BEARING RESTRICTIONS: No  FALLS:  Has patient fallen in last 6 months? No  LIVING ENVIRONMENT: Has steps.   OCCUPATION:  Retired   Hobbies:   Health visitor   PLOF: Independent  PATIENT GOALS:  To be able to walk better    NEXT MD VISIT:  Nothing  scheduled   OBJECTIVE:   DIAGNOSTIC FINDINGS:  MRI: 1. At L4-5 there is a broad-based disc bulge. Severe bilateral facet arthropathy with ligamentum flavum infolding. Bilateral lateral recess narrowing. Mild bilateral foraminal stenosis. Mild grade 1 anterolisthesis of L4 on L5 secondary to facet disease. PATIENT SURVEYS:  FOTO    COGNITION: Overall cognitive status: Within functional limits for tasks assessed     SENSATION: Denies paresthesias   EDEMA:   MUSCLE LENGTH:  POSTURE: No Significant postural limitations  PALPATION: Tender to palpation in lower lumbar paraspinals   LOWER EXTREMITY ROM:  Active ROM Right eval Left eval  Hip flexion    Hip extension    Hip abduction    Hip adduction    Hip internal rotation    Hip external rotation    Knee flexion    Knee extension    Ankle dorsiflexion    Ankle plantarflexion    Ankle inversion    Ankle eversion     (Blank rows = not tested)  LUMBAR ROM:   Active  A/PROM  eval  Flexion   Extension   Right lateral flexion   Left lateral flexion   Right rotation   Left rotation    (Blank rows = not tested)   LOWER EXTREMITY MMT:  MMT Right eval Left eval Right  8/20 Left  8/20 Right  Left   Hip flexion 20.9 12.9 17.2 17.0 26.9 21.9  Hip extension        Hip abduction 26.9 29.8 24.5 28.1 29.1 29.5  Hip adduction        Hip internal rotation        Hip external rotation        Knee flexion        Knee extension 22.8 18.3 29.8 22.9 27.8 17.5  Ankle dorsiflexion        Ankle plantarflexion        Ankle inversion        Ankle eversion         (Blank rows = not tested)    GAIT: Significant lateral shift to the right with ambulation.  Decreased bilateral knee bend with ambulation.  TODAY'S TREATMENT:  DATE:   10/11  Nu-step 6 min L4  LAQ 2x10 2.5lbs bil HSC RTB  2x10bil Sit to stands 2x10 from chair HR/TR 2x15 Step ups- bottom stair 2x10ea (cues for knee flexion with eccentric control) Side stepping with squat at rail with RTB at ankles x2 laps Walking marches 1/2 hall x2 [place] on floor no UE support    10/9  Nu-step 5 min L4  LAQ 2x10 2.5lbs bil HSC RTB 2x10L Sit to stands 2x10 from chair Step ups- bottom stair 2x10ea (cues for knee flexion with eccentric control) Side stepping with squat at rail with RTB at ankles x3 laps Walking marches 1/2 hall x2 Standing marches 2x10 [place] on floor no UE support Airex ant/post wgt shifts x10   10/4   Nu-step 5 min L5  LAQ 2x10 2.5lbs bil HSC RTB 2x10L Sit to stands 2x10 Step ups- bottom stair x10ea (cues for knee flexion) Side stepping with squat at rail with RTB at ankles x1 lap Walking marches 1/2 hall x2 Standing marches 2x10 [place] on floor no UE support Airex ant/post wgt shifts x10  10/2   Nu-step 5 min   Strength testing and review of goals   SAQ 3x10 SLR 3x10  Birdge 3x10  LAQ 2x10   Standing slow march with right LE x5 min   Manual: trigger point release to posterior knee. Patella mobilization    9/24 Seated HSS 30 seconds x2ea Standing calf stretch 30 seconds x2 ea Nu-step L3 LTR x15  Gluteal stretch 2x30 sec hold bilateral  Bridge 2 x 15 SLR 2x15ea LAQ 2x10 L LE STM posterior knee x47min Partial squats x10   9/17 Seated HSS 30 seconds x2ea Standing calf stretch 30 seconds x2 ea Nu-step L3 LTR x15  Gluteal stretch 2x20 sec hold bilateral   Bridge 2 x 10 SLR 2x10ea Side stepping with RTB around ankles  Step ups 6" x10 leading with R LE, unable to complete step up without pain on L LE with either 6 or 4" step.   Partial lunges x10R x3 attempts with L, but had pain       PATIENT EDUCATION:  Education details: HEP, symptom management,  Person educated: Patient Education method: Explanation, Demonstration, Tactile cues, Verbal  cues, and Handouts Education comprehension: verbalized understanding, returned demonstration, verbal cues required, tactile cues required, and needs further education  HOME EXERCISE PROGRAM: Access Code: [email or web address] URL: [email or web address] Date: 07/27/2022 Prepared by: Lorayne Bender  Exercises - Supine Piriformis Stretch with Foot on Ground  - 1 x daily - 7 x weekly - 3 sets - 3 reps - 20 sec  hold - Seated Bilateral Shoulder Flexion Towel Slide at Table Top  - 1 x daily - 7 x weekly - 3 sets - 3 reps - 20 sec  hold - Seated Hamstring Stretch  - 1 x daily - 7 x weekly - 3 sets - 3 reps - 10=20 hold - Supine Bridge with Resistance Band  - 1 x daily - 7 x weekly - 3 sets - 10 reps - Supine March  - 1 x daily - 7 x weekly - 3 sets - 10 reps  ASSESSMENT:  CLINICAL IMPRESSION:  Pt with increased L knee pain today, unable to complete L step ups as she did last session. Pt also demonstrates increased deviations with gait pattern due to lack of knee flexion. Held strenuous/painful activities today and focused on open chain LE strengthening without significant complaint. She was able to perform sit to  stands without pain. Pt brought knee compression sleeve with her today to see if this would be beneficial. She may try this for support with walking/dancing.  Will monitor pain level and progress as tolerated.   OBJECTIVE IMPAIRMENTS: Abnormal gait, decreased activity tolerance, difficulty walking, decreased ROM, decreased strength, and pain.   ACTIVITY LIMITATIONS: sitting, standing, squatting, stairs, and locomotion level  PARTICIPATION LIMITATIONS: meal prep, cleaning, laundry, shopping, community activity, and yard work  PERSONAL FACTORS: 1-2 comorbidities: osteopetrosis  are also affecting patient's functional outcome.   REHAB POTENTIAL: Good  CLINICAL DECISION MAKING: Evolving/moderate complexity increased difficulty transferring from sit to stand and then  ambulating  EVALUATION COMPLEXITY: Moderate   GOALS: Goals reviewed with patient? Yes  SHORT TERM GOALS: Target date: 08/17/2022   Patient will increase bilateral lower extremity strength by 5 pounds Baseline: Goal status: INITIAL  2.  Patient will report a 50% reduction in stiffness with transfer from sit to stand Baseline:  Goal status: IN PROGRESS 8/15  3.  Patient will be independent with HEP Baseline:  Goal status: MET 10/9  LONG TERM GOALS: Target date: 09/07/2022    The patient will go up and down 6 steps with reciprocal pattern  Baseline:  Goal status: IN PROGRESS 10/9  2.  Patient will stand and walk without stiffness  Baseline:  Goal status: IN PROGRESS 10/9  3.  Patient will be independent with complete HEP Baseline:  Goal status: INITIAL   PLAN:  PT FREQUENCY: 1-2x/week  PT DURATION: 6 weeks  PLANNED INTERVENTIONS: Therapeutic exercises, Therapeutic activity, Neuromuscular re-education, Balance training, Gait training, Patient/Family education, Self Care, Joint mobilization, Stair training, DME instructions, Aquatic Therapy, Dry Needling, Cryotherapy, Moist heat, and Manual therapy  PLAN FOR NEXT SESSION: Patient has a gym membership but reports she just goes for line dancing.  Patient given stretches previous visit.  Next visit focus on expanding supine exercises.  Add sit to stand transfers.  Review tolerance to stretching and HEP.  Continue with core stabilization exercises as tolerated.  Consider standing series of core stabilization as it may be easier for her to do at home rather than having to lie down.   Donnel Saxon Kymberly Blomberg, PTA 10/29/2022, 4:50 PM

## 2022-11-02 ENCOUNTER — Encounter (HOSPITAL_BASED_OUTPATIENT_CLINIC_OR_DEPARTMENT_OTHER): Payer: Self-pay

## 2022-11-02 ENCOUNTER — Ambulatory Visit (HOSPITAL_BASED_OUTPATIENT_CLINIC_OR_DEPARTMENT_OTHER): Payer: Medicare Other

## 2022-11-02 DIAGNOSIS — R2689 Other abnormalities of gait and mobility: Secondary | ICD-10-CM | POA: Diagnosis not present

## 2022-11-02 NOTE — Therapy (Signed)
OUTPATIENT PHYSICAL THERAPY LOWER EXTREMITY TREATMENT        Patient Name: Patricia Hoover MRN: [id number] DOB:December 01, 1945, 77 y.o., female Today's Date: 11/02/2022  END OF SESSION:  PT End of Session - 11/02/22 1706     Visit Number 14    Number of Visits 20    Date for PT Re-Evaluation 11/24/22    PT Start Time 1603    PT Stop Time 1647    PT Time Calculation (min) 44 min    Activity Tolerance Patient tolerated treatment well    Behavior During Therapy [facility] for tasks assessed/performed                       Past Medical History:  Diagnosis Date   Arrhythmia    possible hx, resolved prev   Blood transfusion without reported diagnosis 1972   had reaction; had to stop   Carpal tunnel syndrome    Cataract 2019   resolved with surgery   Duodenal ulcer    H/O exercise stress test 2012   normal   Heart murmur    as child   High cholesterol    History of kidney stones    Hx of colonic polyps    Hypertension    Osteoporosis 2010   t score - 3.9   Renal stones    Past Surgical History:  Procedure Laterality Date   ABDOMINAL HYSTERECTOMY  1972   for endometriosis   CATARACT EXTRACTION W/ INTRAOCULAR LENS IMPLANT Right 06/2017   CATARACT EXTRACTION W/ INTRAOCULAR LENS IMPLANT Left 07/2017   COLONOSCOPY Left 03/02/2017   Procedure: COLONOSCOPY;  Surgeon: Pasty Spillers, MD;  Location: [facility] ENDOSCOPY;  Service: Endoscopy;  Laterality: Left;   COLONOSCOPY WITH PROPOFOL N/A 05/04/2016   Procedure: COLONOSCOPY WITH PROPOFOL;  Surgeon: Wyline Mood, MD;  Location: [facility] ENDOSCOPY;  Service: Endoscopy;  Laterality: N/A;   ESOPHAGOGASTRODUODENOSCOPY Left 03/02/2017   Procedure: ESOPHAGOGASTRODUODENOSCOPY (EGD);  Surgeon: Pasty Spillers, MD;  Location: [facility] ENDOSCOPY;  Service: Endoscopy;  Laterality: Left;   ESOPHAGOGASTRODUODENOSCOPY (EGD) WITH PROPOFOL N/A 04/08/2017   Procedure: ESOPHAGOGASTRODUODENOSCOPY (EGD) WITH PROPOFOL;  Surgeon: Pasty Spillers,  MD;  Location: [facility] ENDOSCOPY;  Service: Endoscopy;  Laterality: N/A;   LITHOTRIPSY     for kidney stone x5   LITHOTRIPSY  09/2017   Patient Active Problem List   Diagnosis Date Noted   Dysuria 04/21/2022   Stiff-legged gait 04/21/2022   Scalp lesion 09/21/2021   Clavicle enlargement 04/05/2021   Hyperpigmentation 03/01/2021   Vitamin D deficiency 03/01/2021   Leg pain 05/21/2019   Right leg pain 05/21/2019   Healthcare maintenance 01/08/2018   Paresthesia 01/08/2018   Neck pain 08/31/2017   Angioedema 06/15/2017   Peptic ulcer of duodenum    Other specified disease of esophagus    Hematochezia    External hemorrhoids    Diverticulosis of large intestine without diverticulitis    Internal hemorrhoids    Duodenal ulceration    Advance care planning 12/23/2016   Renal stone 04/20/2016   Heartburn 12/17/2015   Medicare annual wellness visit, subsequent 06/05/2013   Hyperglycemia 06/05/2013   Knee pain 08/31/2012   Osteoporosis 08/18/2012   HTN (hypertension) 08/09/2012   HLD (hyperlipidemia) 08/09/2012    PCP: Dr Crawford Givens   REFERRING PROVIDER: Dr Crawford Givens   REFERRING DIAG:  Diagnosis  R26.89 (ICD-10-CM) - Stiff-legged gait    THERAPY DIAG:  Other abnormalities of gait and mobility  Rationale for Evaluation and Treatment:  Rehabilitation  ONSET DATE:   SUBJECTIVE:   SUBJECTIVE STATEMENT: Pt reports her pain comes and goes. Doing good today. "I think it's all in my back. When I do my exercises before I get out of bed, I can walk without the cane." Pt arrives carrying cane, but states she did not use it.     PERTINENT HISTORY: Osteoporosis 2010 t score - 3.9  Carpal tunnel syndrome   PAIN:  Are you having pain? PAIN:  Are you having pain? No   PRECAUTIONS: None  WEIGHT BEARING RESTRICTIONS: No  FALLS:  Has patient fallen in last 6 months? No  LIVING ENVIRONMENT: Has steps.   OCCUPATION:  Retired   Hobbies:   Health visitor    PLOF: Independent  PATIENT GOALS:  To be able to walk better    NEXT MD VISIT:  Nothing scheduled   OBJECTIVE:   DIAGNOSTIC FINDINGS:  MRI: 1. At L4-5 there is a broad-based disc bulge. Severe bilateral facet arthropathy with ligamentum flavum infolding. Bilateral lateral recess narrowing. Mild bilateral foraminal stenosis. Mild grade 1 anterolisthesis of L4 on L5 secondary to facet disease. PATIENT SURVEYS:  FOTO    COGNITION: Overall cognitive status: Within functional limits for tasks assessed     SENSATION: Denies paresthesias   EDEMA:   MUSCLE LENGTH:  POSTURE: No Significant postural limitations  PALPATION: Tender to palpation in lower lumbar paraspinals   LOWER EXTREMITY ROM:  Active ROM Right eval Left eval  Hip flexion    Hip extension    Hip abduction    Hip adduction    Hip internal rotation    Hip external rotation    Knee flexion    Knee extension    Ankle dorsiflexion    Ankle plantarflexion    Ankle inversion    Ankle eversion     (Blank rows = not tested)  LUMBAR ROM:   Active  A/PROM  eval  Flexion   Extension   Right lateral flexion   Left lateral flexion   Right rotation   Left rotation    (Blank rows = not tested)   LOWER EXTREMITY MMT:  MMT Right eval Left eval Right  8/20 Left  8/20 Right  Left   Hip flexion 20.9 12.9 17.2 17.0 26.9 21.9  Hip extension        Hip abduction 26.9 29.8 24.5 28.1 29.1 29.5  Hip adduction        Hip internal rotation        Hip external rotation        Knee flexion        Knee extension 22.8 18.3 29.8 22.9 27.8 17.5  Ankle dorsiflexion        Ankle plantarflexion        Ankle inversion        Ankle eversion         (Blank rows = not tested)    GAIT: Significant lateral shift to the right with ambulation.  Decreased bilateral knee bend with ambulation.  TODAY'S TREATMENT:  DATE:   10/15  Nu-step 6 min L4  LAQ 2x10 3lbs bil HSC RTB 2x10bil Sit to stands 3x10 from chair HR/TR 2x15 Standing abd/ext RTB at ankles 3x10ea Step ups- bottom stair 2x10ea (cues for knee flexion with eccentric control) Side stepping with squat at rail with RTB at ankles x2 laps Walking marches 1/2 hall x2 [place] on floor no UE support 2x10  10/11  Nu-step 6 min L4  LAQ 2x10 2.5lbs bil HSC RTB 2x10bil Sit to stands 2x10 from chair HR/TR 2x15 Step ups- bottom stair 2x10ea (cues for knee flexion with eccentric control) Side stepping with squat at rail with RTB at ankles x2 laps Walking marches 1/2 hall x2 [place] on floor no UE support    10/9  Nu-step 5 min L4  LAQ 2x10 2.5lbs bil HSC RTB 2x10L Sit to stands 2x10 from chair Step ups- bottom stair 2x10ea (cues for knee flexion with eccentric control) Side stepping with squat at rail with RTB at ankles x3 laps Walking marches 1/2 hall x2 Standing marches 2x10 [place] on floor no UE support Airex ant/post wgt shifts x10   10/4   Nu-step 5 min L5  LAQ 2x10 2.5lbs bil HSC RTB 2x10L Sit to stands 2x10 Step ups- bottom stair x10ea (cues for knee flexion) Side stepping with squat at rail with RTB at ankles x1 lap Walking marches 1/2 hall x2 Standing marches 2x10 [place] on floor no UE support Airex ant/post wgt shifts x10  10/2   Nu-step 5 min   Strength testing and review of goals   SAQ 3x10 SLR 3x10  Birdge 3x10  LAQ 2x10   Standing slow march with right LE x5 min   Manual: trigger point release to posterior knee. Patella mobilization    PATIENT EDUCATION:  Education details: HEP, symptom management,  Person educated: Patient Education method: Explanation, Demonstration, Tactile cues, Verbal cues, and Handouts Education comprehension: verbalized understanding, returned demonstration, verbal cues required, tactile cues required, and needs further education  HOME  EXERCISE PROGRAM: Access Code: [email or web address] URL: [email or web address] Date: 07/27/2022 Prepared by: Lorayne Bender  Exercises - Supine Piriformis Stretch with Foot on Ground  - 1 x daily - 7 x weekly - 3 sets - 3 reps - 20 sec  hold - Seated Bilateral Shoulder Flexion Towel Slide at Table Top  - 1 x daily - 7 x weekly - 3 sets - 3 reps - 20 sec  hold - Seated Hamstring Stretch  - 1 x daily - 7 x weekly - 3 sets - 3 reps - 10=20 hold - Supine Bridge with Resistance Band  - 1 x daily - 7 x weekly - 3 sets - 10 reps - Supine March  - 1 x daily - 7 x weekly - 3 sets - 10 reps  ASSESSMENT:  CLINICAL IMPRESSION:  Pt able to resume prior level of exercise tolerance today, completing step ups at 6" stair in clinic without c/o pain. Decreased cuing required for L knee flexion with this activity. Improved endurance with sit to stands from standard chair. She does require frequent cues with standing hip abd/ext for posture and hip orientation.   OBJECTIVE IMPAIRMENTS: Abnormal gait, decreased activity tolerance, difficulty walking, decreased ROM, decreased strength, and pain.   ACTIVITY LIMITATIONS: sitting, standing, squatting, stairs, and locomotion level  PARTICIPATION LIMITATIONS: meal prep, cleaning, laundry, shopping, community activity, and yard work  PERSONAL FACTORS: 1-2 comorbidities: osteopetrosis  are also affecting patient's functional outcome.   REHAB POTENTIAL: Good  CLINICAL DECISION  MAKING: Evolving/moderate complexity increased difficulty transferring from sit to stand and then ambulating  EVALUATION COMPLEXITY: Moderate   GOALS: Goals reviewed with patient? Yes  SHORT TERM GOALS: Target date: 08/17/2022   Patient will increase bilateral lower extremity strength by 5 pounds Baseline: Goal status: INITIAL  2.  Patient will report a 50% reduction in stiffness with transfer from sit to stand Baseline:  Goal status: IN PROGRESS 8/15  3.  Patient will be  independent with HEP Baseline:  Goal status: MET 10/9  LONG TERM GOALS: Target date: 09/07/2022    The patient will go up and down 6 steps with reciprocal pattern  Baseline:  Goal status: IN PROGRESS 10/9  2.  Patient will stand and walk without stiffness  Baseline:  Goal status: IN PROGRESS 10/9  3.  Patient will be independent with complete HEP Baseline:  Goal status: INITIAL   PLAN:  PT FREQUENCY: 1-2x/week  PT DURATION: 6 weeks  PLANNED INTERVENTIONS: Therapeutic exercises, Therapeutic activity, Neuromuscular re-education, Balance training, Gait training, Patient/Family education, Self Care, Joint mobilization, Stair training, DME instructions, Aquatic Therapy, Dry Needling, Cryotherapy, Moist heat, and Manual therapy  PLAN FOR NEXT SESSION: Patient has a gym membership but reports she just goes for line dancing.  Patient given stretches previous visit.  Next visit focus on expanding supine exercises.  Add sit to stand transfers.  Review tolerance to stretching and HEP.  Continue with core stabilization exercises as tolerated.  Consider standing series of core stabilization as it may be easier for her to do at home rather than having to lie down.   Donnel Saxon Augustine Leverette, PTA 11/02/2022, 5:08 PM

## 2022-11-05 ENCOUNTER — Ambulatory Visit (HOSPITAL_BASED_OUTPATIENT_CLINIC_OR_DEPARTMENT_OTHER): Payer: Medicare Other

## 2022-11-05 ENCOUNTER — Other Ambulatory Visit (HOSPITAL_BASED_OUTPATIENT_CLINIC_OR_DEPARTMENT_OTHER): Payer: Self-pay

## 2022-11-05 ENCOUNTER — Encounter (HOSPITAL_BASED_OUTPATIENT_CLINIC_OR_DEPARTMENT_OTHER): Payer: Self-pay

## 2022-11-05 DIAGNOSIS — R2689 Other abnormalities of gait and mobility: Secondary | ICD-10-CM

## 2022-11-05 MED ORDER — COMIRNATY 30 MCG/0.3ML IM SUSY
0.3000 mL | PREFILLED_SYRINGE | Freq: Once | INTRAMUSCULAR | 0 refills | Status: AC
  Filled 2022-11-05: qty 0.3, 1d supply, fill #0

## 2022-11-05 NOTE — Therapy (Signed)
OUTPATIENT PHYSICAL THERAPY LOWER EXTREMITY TREATMENT        Patient Name: Patricia Hoover MRN: [id number] DOB:1945-01-31, 77 y.o., female Today's Date: 11/05/2022  END OF SESSION:  PT End of Session - 11/05/22 1320     Visit Number 15    Number of Visits 20    Date for PT Re-Evaluation 11/24/22    PT Start Time 1303    PT Stop Time 1344    PT Time Calculation (min) 41 min    Activity Tolerance Patient tolerated treatment well    Behavior During Therapy [facility] for tasks assessed/performed                        Past Medical History:  Diagnosis Date   Arrhythmia    possible hx, resolved prev   Blood transfusion without reported diagnosis 1972   had reaction; had to stop   Carpal tunnel syndrome    Cataract 2019   resolved with surgery   Duodenal ulcer    H/O exercise stress test 2012   normal   Heart murmur    as child   High cholesterol    History of kidney stones    Hx of colonic polyps    Hypertension    Osteoporosis 2010   t score - 3.9   Renal stones    Past Surgical History:  Procedure Laterality Date   ABDOMINAL HYSTERECTOMY  1972   for endometriosis   CATARACT EXTRACTION W/ INTRAOCULAR LENS IMPLANT Right 06/2017   CATARACT EXTRACTION W/ INTRAOCULAR LENS IMPLANT Left 07/2017   COLONOSCOPY Left 03/02/2017   Procedure: COLONOSCOPY;  Surgeon: Pasty Spillers, MD;  Location: [facility] ENDOSCOPY;  Service: Endoscopy;  Laterality: Left;   COLONOSCOPY WITH PROPOFOL N/A 05/04/2016   Procedure: COLONOSCOPY WITH PROPOFOL;  Surgeon: Wyline Mood, MD;  Location: [facility] ENDOSCOPY;  Service: Endoscopy;  Laterality: N/A;   ESOPHAGOGASTRODUODENOSCOPY Left 03/02/2017   Procedure: ESOPHAGOGASTRODUODENOSCOPY (EGD);  Surgeon: Pasty Spillers, MD;  Location: [facility] ENDOSCOPY;  Service: Endoscopy;  Laterality: Left;   ESOPHAGOGASTRODUODENOSCOPY (EGD) WITH PROPOFOL N/A 04/08/2017   Procedure: ESOPHAGOGASTRODUODENOSCOPY (EGD) WITH PROPOFOL;  Surgeon: Pasty Spillers,  MD;  Location: [facility] ENDOSCOPY;  Service: Endoscopy;  Laterality: N/A;   LITHOTRIPSY     for kidney stone x5   LITHOTRIPSY  09/2017   Patient Active Problem List   Diagnosis Date Noted   Dysuria 04/21/2022   Stiff-legged gait 04/21/2022   Scalp lesion 09/21/2021   Clavicle enlargement 04/05/2021   Hyperpigmentation 03/01/2021   Vitamin D deficiency 03/01/2021   Leg pain 05/21/2019   Right leg pain 05/21/2019   Healthcare maintenance 01/08/2018   Paresthesia 01/08/2018   Neck pain 08/31/2017   Angioedema 06/15/2017   Peptic ulcer of duodenum    Other specified disease of esophagus    Hematochezia    External hemorrhoids    Diverticulosis of large intestine without diverticulitis    Internal hemorrhoids    Duodenal ulceration    Advance care planning 12/23/2016   Renal stone 04/20/2016   Heartburn 12/17/2015   Medicare annual wellness visit, subsequent 06/05/2013   Hyperglycemia 06/05/2013   Knee pain 08/31/2012   Osteoporosis 08/18/2012   HTN (hypertension) 08/09/2012   HLD (hyperlipidemia) 08/09/2012    PCP: Dr Crawford Givens   REFERRING PROVIDER: Dr Crawford Givens   REFERRING DIAG:  Diagnosis  R26.89 (ICD-10-CM) - Stiff-legged gait    THERAPY DIAG:  Other abnormalities of gait and mobility  Rationale for Evaluation and  Treatment: Rehabilitation  ONSET DATE:   SUBJECTIVE:   SUBJECTIVE STATEMENT: Pt reports her L knee pain comes and goes. Feels good today.    PERTINENT HISTORY: Osteoporosis 2010 t score - 3.9  Carpal tunnel syndrome   PAIN:  Are you having pain? PAIN:  Are you having pain? No   PRECAUTIONS: None  WEIGHT BEARING RESTRICTIONS: No  FALLS:  Has patient fallen in last 6 months? No  LIVING ENVIRONMENT: Has steps.   OCCUPATION:  Retired   Hobbies:   Health visitor   PLOF: Independent  PATIENT GOALS:  To be able to walk better    NEXT MD VISIT:  Nothing scheduled   OBJECTIVE:   DIAGNOSTIC FINDINGS:  MRI: 1.  At L4-5 there is a broad-based disc bulge. Severe bilateral facet arthropathy with ligamentum flavum infolding. Bilateral lateral recess narrowing. Mild bilateral foraminal stenosis. Mild grade 1 anterolisthesis of L4 on L5 secondary to facet disease. PATIENT SURVEYS:  FOTO    COGNITION: Overall cognitive status: Within functional limits for tasks assessed     SENSATION: Denies paresthesias   EDEMA:   MUSCLE LENGTH:  POSTURE: No Significant postural limitations  PALPATION: Tender to palpation in lower lumbar paraspinals   LOWER EXTREMITY ROM:  Active ROM Right eval Left eval  Hip flexion    Hip extension    Hip abduction    Hip adduction    Hip internal rotation    Hip external rotation    Knee flexion    Knee extension    Ankle dorsiflexion    Ankle plantarflexion    Ankle inversion    Ankle eversion     (Blank rows = not tested)  LUMBAR ROM:   Active  A/PROM  eval  Flexion   Extension   Right lateral flexion   Left lateral flexion   Right rotation   Left rotation    (Blank rows = not tested)   LOWER EXTREMITY MMT:  MMT Right eval Left eval Right  8/20 Left  8/20 Right  Left   Hip flexion 20.9 12.9 17.2 17.0 26.9 21.9  Hip extension        Hip abduction 26.9 29.8 24.5 28.1 29.1 29.5  Hip adduction        Hip internal rotation        Hip external rotation        Knee flexion        Knee extension 22.8 18.3 29.8 22.9 27.8 17.5  Ankle dorsiflexion        Ankle plantarflexion        Ankle inversion        Ankle eversion         (Blank rows = not tested)    GAIT: Significant lateral shift to the right with ambulation.  Decreased bilateral knee bend with ambulation.  TODAY'S TREATMENT:  DATE:   10/18  Nu-step 5 min L4  LAQ 2x10 3lbs bil HSC RTB 2x10bil Sit to stands 3x10 from chair HR/TR 2x15 Standing abd/ext  RTB at ankles 3x10ea Step ups- bottom stair 2x10ea (cues for knee flexion with eccentric control) Side stepping with squat at rail with RTB at ankles x2 laps   10/15  Nu-step 6 min L4  LAQ 2x10 3lbs bil HSC RTB 2x10bil Sit to stands 3x10 from chair HR/TR 2x15 Standing abd/ext RTB at ankles 3x10ea Step ups- bottom stair 2x10ea (cues for knee flexion with eccentric control) Side stepping with squat at rail with RTB at ankles x2 laps Walking marches 1/2 hall x2 [place] on floor no UE support 2x10  10/11  Nu-step 6 min L4  LAQ 2x10 2.5lbs bil HSC RTB 2x10bil Sit to stands 2x10 from chair HR/TR 2x15 Step ups- bottom stair 2x10ea (cues for knee flexion with eccentric control) Side stepping with squat at rail with RTB at ankles x2 laps Walking marches 1/2 hall x2 [place] on floor no UE support    10/9  Nu-step 5 min L4  LAQ 2x10 2.5lbs bil HSC RTB 2x10L Sit to stands 2x10 from chair Step ups- bottom stair 2x10ea (cues for knee flexion with eccentric control) Side stepping with squat at rail with RTB at ankles x3 laps Walking marches 1/2 hall x2 Standing marches 2x10 [place] on floor no UE support Airex ant/post wgt shifts x10   10/4   Nu-step 5 min L5  LAQ 2x10 2.5lbs bil HSC RTB 2x10L Sit to stands 2x10 Step ups- bottom stair x10ea (cues for knee flexion) Side stepping with squat at rail with RTB at ankles x1 lap Walking marches 1/2 hall x2 Standing marches 2x10 [place] on floor no UE support Airex ant/post wgt shifts x10  10/2   Nu-step 5 min   Strength testing and review of goals   SAQ 3x10 SLR 3x10  Birdge 3x10  LAQ 2x10   Standing slow march with right LE x5 min   Manual: trigger point release to posterior knee. Patella mobilization    PATIENT EDUCATION:  Education details: HEP, symptom management,  Person educated: Patient Education method: Explanation, Demonstration, Tactile cues, Verbal cues, and Handouts Education comprehension:  verbalized understanding, returned demonstration, verbal cues required, tactile cues required, and needs further education  HOME EXERCISE PROGRAM: Access Code: [email or web address] URL: [email or web address] Date: 07/27/2022 Prepared by: Lorayne Bender  Exercises - Supine Piriformis Stretch with Foot on Ground  - 1 x daily - 7 x weekly - 3 sets - 3 reps - 20 sec  hold - Seated Bilateral Shoulder Flexion Towel Slide at Table Top  - 1 x daily - 7 x weekly - 3 sets - 3 reps - 20 sec  hold - Seated Hamstring Stretch  - 1 x daily - 7 x weekly - 3 sets - 3 reps - 10=20 hold - Supine Bridge with Resistance Band  - 1 x daily - 7 x weekly - 3 sets - 10 reps - Supine March  - 1 x daily - 7 x weekly - 3 sets - 10 reps  ASSESSMENT:  CLINICAL IMPRESSION:  Pt remains challenged with step up/down on L LE, though able to complete 2x10 on stairs in clinic. Will progress resistance with strengthening exercises next visit as tolerated.   OBJECTIVE IMPAIRMENTS: Abnormal gait, decreased activity tolerance, difficulty walking, decreased ROM, decreased strength, and pain.   ACTIVITY LIMITATIONS: sitting, standing, squatting, stairs, and locomotion level  PARTICIPATION LIMITATIONS: meal prep,  cleaning, laundry, shopping, community activity, and yard work  PERSONAL FACTORS: 1-2 comorbidities: osteopetrosis  are also affecting patient's functional outcome.   REHAB POTENTIAL: Good  CLINICAL DECISION MAKING: Evolving/moderate complexity increased difficulty transferring from sit to stand and then ambulating  EVALUATION COMPLEXITY: Moderate   GOALS: Goals reviewed with patient? Yes  SHORT TERM GOALS: Target date: 08/17/2022   Patient will increase bilateral lower extremity strength by 5 pounds Baseline: Goal status: INITIAL  2.  Patient will report a 50% reduction in stiffness with transfer from sit to stand Baseline:  Goal status: IN PROGRESS 8/15  3.  Patient will be independent with  HEP Baseline:  Goal status: MET 10/9  LONG TERM GOALS: Target date: 09/07/2022    The patient will go up and down 6 steps with reciprocal pattern  Baseline:  Goal status: IN PROGRESS 10/9  2.  Patient will stand and walk without stiffness  Baseline:  Goal status: IN PROGRESS 10/9  3.  Patient will be independent with complete HEP Baseline:  Goal status: INITIAL   PLAN:  PT FREQUENCY: 1-2x/week  PT DURATION: 6 weeks  PLANNED INTERVENTIONS: Therapeutic exercises, Therapeutic activity, Neuromuscular re-education, Balance training, Gait training, Patient/Family education, Self Care, Joint mobilization, Stair training, DME instructions, Aquatic Therapy, Dry Needling, Cryotherapy, Moist heat, and Manual therapy  PLAN FOR NEXT SESSION: Patient has a gym membership but reports she just goes for line dancing.  Patient given stretches previous visit.  Next visit focus on expanding supine exercises.  Add sit to stand transfers.  Review tolerance to stretching and HEP.  Continue with core stabilization exercises as tolerated.  Consider standing series of core stabilization as it may be easier for her to do at home rather than having to lie down.   Donnel Saxon Chau Savell, PTA 11/05/2022, 2:24 PM

## 2022-11-10 ENCOUNTER — Ambulatory Visit (HOSPITAL_BASED_OUTPATIENT_CLINIC_OR_DEPARTMENT_OTHER): Payer: Medicare Other | Admitting: Physical Therapy

## 2022-11-10 DIAGNOSIS — R2689 Other abnormalities of gait and mobility: Secondary | ICD-10-CM | POA: Diagnosis not present

## 2022-11-10 NOTE — Therapy (Unsigned)
OUTPATIENT PHYSICAL THERAPY LOWER EXTREMITY TREATMENT        Patient Name: Patricia Hoover MRN: [id number] DOB:12/31/1945, 77 y.o., female Today's Date: 11/10/2022  END OF SESSION:  PT End of Session - 11/10/22 1354     Visit Number 16    Number of Visits 20    Date for PT Re-Evaluation 11/24/22    PT Start Time 1345    PT Stop Time 1428    PT Time Calculation (min) 43 min    Activity Tolerance Patient tolerated treatment well    Behavior During Therapy [facility] for tasks assessed/performed                        Past Medical History:  Diagnosis Date   Arrhythmia    possible hx, resolved prev   Blood transfusion without reported diagnosis 1972   had reaction; had to stop   Carpal tunnel syndrome    Cataract 2019   resolved with surgery   Duodenal ulcer    H/O exercise stress test 2012   normal   Heart murmur    as child   High cholesterol    History of kidney stones    Hx of colonic polyps    Hypertension    Osteoporosis 2010   t score - 3.9   Renal stones    Past Surgical History:  Procedure Laterality Date   ABDOMINAL HYSTERECTOMY  1972   for endometriosis   CATARACT EXTRACTION W/ INTRAOCULAR LENS IMPLANT Right 06/2017   CATARACT EXTRACTION W/ INTRAOCULAR LENS IMPLANT Left 07/2017   COLONOSCOPY Left 03/02/2017   Procedure: COLONOSCOPY;  Surgeon: Pasty Spillers, MD;  Location: [facility] ENDOSCOPY;  Service: Endoscopy;  Laterality: Left;   COLONOSCOPY WITH PROPOFOL N/A 05/04/2016   Procedure: COLONOSCOPY WITH PROPOFOL;  Surgeon: Wyline Mood, MD;  Location: [facility] ENDOSCOPY;  Service: Endoscopy;  Laterality: N/A;   ESOPHAGOGASTRODUODENOSCOPY Left 03/02/2017   Procedure: ESOPHAGOGASTRODUODENOSCOPY (EGD);  Surgeon: Pasty Spillers, MD;  Location: [facility] ENDOSCOPY;  Service: Endoscopy;  Laterality: Left;   ESOPHAGOGASTRODUODENOSCOPY (EGD) WITH PROPOFOL N/A 04/08/2017   Procedure: ESOPHAGOGASTRODUODENOSCOPY (EGD) WITH PROPOFOL;  Surgeon: Pasty Spillers,  MD;  Location: [facility] ENDOSCOPY;  Service: Endoscopy;  Laterality: N/A;   LITHOTRIPSY     for kidney stone x5   LITHOTRIPSY  09/2017   Patient Active Problem List   Diagnosis Date Noted   Dysuria 04/21/2022   Stiff-legged gait 04/21/2022   Scalp lesion 09/21/2021   Clavicle enlargement 04/05/2021   Hyperpigmentation 03/01/2021   Vitamin D deficiency 03/01/2021   Leg pain 05/21/2019   Right leg pain 05/21/2019   Healthcare maintenance 01/08/2018   Paresthesia 01/08/2018   Neck pain 08/31/2017   Angioedema 06/15/2017   Peptic ulcer of duodenum    Other specified disease of esophagus    Hematochezia    External hemorrhoids    Diverticulosis of large intestine without diverticulitis    Internal hemorrhoids    Duodenal ulceration    Advance care planning 12/23/2016   Renal stone 04/20/2016   Heartburn 12/17/2015   Medicare annual wellness visit, subsequent 06/05/2013   Hyperglycemia 06/05/2013   Knee pain 08/31/2012   Osteoporosis 08/18/2012   HTN (hypertension) 08/09/2012   HLD (hyperlipidemia) 08/09/2012   Progress Note Reporting Period 8/20 to 10/23  See note below for Objective Data and Assessment of Progress/Goals.     PCP: Dr Crawford Givens   REFERRING PROVIDER: Dr Crawford Givens   REFERRING DIAG:  Diagnosis  R26.89 (  ICD-10-CM) - Stiff-legged gait    THERAPY DIAG:  Other abnormalities of gait and mobility  Rationale for Evaluation and Treatment: Rehabilitation  ONSET DATE:   SUBJECTIVE:   SUBJECTIVE STATEMENT: Pt reports her L knee pain comes and goes. Feels good today.    PERTINENT HISTORY: Osteoporosis 2010 t score - 3.9  Carpal tunnel syndrome   PAIN:  Are you having pain? PAIN:  Are you having pain? No   PRECAUTIONS: None  WEIGHT BEARING RESTRICTIONS: No  FALLS:  Has patient fallen in last 6 months? No  LIVING ENVIRONMENT: Has steps.   OCCUPATION:  Retired   Hobbies:   Health visitor   PLOF: Independent  PATIENT  GOALS:  To be able to walk better    NEXT MD VISIT:  Nothing scheduled   OBJECTIVE:   DIAGNOSTIC FINDINGS:  MRI: 1. At L4-5 there is a broad-based disc bulge. Severe bilateral facet arthropathy with ligamentum flavum infolding. Bilateral lateral recess narrowing. Mild bilateral foraminal stenosis. Mild grade 1 anterolisthesis of L4 on L5 secondary to facet disease. PATIENT SURVEYS:  FOTO    COGNITION: Overall cognitive status: Within functional limits for tasks assessed     SENSATION: Denies paresthesias   EDEMA:   MUSCLE LENGTH:  POSTURE: No Significant postural limitations  PALPATION: Tender to palpation in lower lumbar paraspinals   LOWER EXTREMITY ROM:  Active ROM Right eval Left eval  Hip flexion    Hip extension    Hip abduction    Hip adduction    Hip internal rotation    Hip external rotation    Knee flexion    Knee extension    Ankle dorsiflexion    Ankle plantarflexion    Ankle inversion    Ankle eversion     (Blank rows = not tested)  LUMBAR ROM:   Active  A/PROM  eval  Flexion   Extension   Right lateral flexion   Left lateral flexion   Right rotation   Left rotation    (Blank rows = not tested)   LOWER EXTREMITY MMT:  MMT Right eval Left eval Right  8/20 Left  8/20 Right  Left  Right  10/23 Left  10/23  Hip flexion 20.9 12.9 17.2 17.0 26.9 21.9 28.5 25.2  Hip extension          Hip abduction 26.9 29.8 24.5 28.1 29.1 29.5 31.4 30.8  Hip adduction          Hip internal rotation          Hip external rotation          Knee flexion          Knee extension 22.8 18.3 29.8 22.9 27.8 17.5 26.8 30.7  Ankle dorsiflexion          Ankle plantarflexion          Ankle inversion          Ankle eversion           (Blank rows = not tested)    GAIT: Significant lateral shift to the right with ambulation.  Decreased bilateral knee bend with ambulation.  TODAY'S TREATMENT:  DATE:   10/23 Nu-step 4 min L4  LAQ 2x10 3lbs bil HSC RTB 2x10bil  Sit to stands 3x10 from chair  HR/TR 2x15 Standing abd/ext RTB at ankles 2x10ea Step ups- bottom stair 2x10ea (cues for knee flexion with eccentric control) Side stepping with squat at rail with RTB at ankles x2 laps  Measured muscle strength   10/18  Nu-step 5 min L4  LAQ 2x10 3lbs bil HSC RTB 2x10bil Sit to stands 3x10 from chair HR/TR 2x15 Standing abd/ext RTB at ankles 3x10ea Step ups- bottom stair 2x10ea (cues for knee flexion with eccentric control) Side stepping with squat at rail with RTB at ankles x2 laps   10/15  Nu-step 6 min L4  LAQ 2x10 3lbs bil HSC RTB 2x10bil Sit to stands 3x10 from chair HR/TR 2x15 Standing abd/ext RTB at ankles 3x10ea Step ups- bottom stair 2x10ea (cues for knee flexion with eccentric control) Side stepping with squat at rail with RTB at ankles x2 laps Walking marches 1/2 hall x2 [place] on floor no UE support 2x10  10/11  Nu-step 6 min L4  LAQ 2x10 2.5lbs bil HSC RTB 2x10bil Sit to stands 2x10 from chair HR/TR 2x15 Step ups- bottom stair 2x10ea (cues for knee flexion with eccentric control) Side stepping with squat at rail with RTB at ankles x2 laps Walking marches 1/2 hall x2 [place] on floor no UE support    10/9  Nu-step 5 min L4  LAQ 2x10 2.5lbs bil HSC RTB 2x10L Sit to stands 2x10 from chair Step ups- bottom stair 2x10ea (cues for knee flexion with eccentric control) Side stepping with squat at rail with RTB at ankles x3 laps Walking marches 1/2 hall x2 Standing marches 2x10 [place] on floor no UE support Airex ant/post wgt shifts x10   10/4   Nu-step 5 min L5  LAQ 2x10 2.5lbs bil HSC RTB 2x10L Sit to stands 2x10 Step ups- bottom stair x10ea (cues for knee flexion) Side stepping with squat at rail with RTB at ankles x1 lap Walking marches 1/2 hall x2 Standing  marches 2x10 [place] on floor no UE support Airex ant/post wgt shifts x10  10/2   Nu-step 5 min   Strength testing and review of goals   SAQ 3x10 SLR 3x10  Birdge 3x10  LAQ 2x10   Standing slow march with right LE x5 min   Manual: trigger point release to posterior knee. Patella mobilization    PATIENT EDUCATION:  Education details: HEP, symptom management,  Person educated: Patient Education method: Explanation, Demonstration, Tactile cues, Verbal cues, and Handouts Education comprehension: verbalized understanding, returned demonstration, verbal cues required, tactile cues required, and needs further education  HOME EXERCISE PROGRAM: Access Code: [email or web address] URL: [email or web address] Date: 07/27/2022 Prepared by: Lorayne Bender  Exercises - Supine Piriformis Stretch with Foot on Ground  - 1 x daily - 7 x weekly - 3 sets - 3 reps - 20 sec  hold - Seated Bilateral Shoulder Flexion Towel Slide at Table Top  - 1 x daily - 7 x weekly - 3 sets - 3 reps - 20 sec  hold - Seated Hamstring Stretch  - 1 x daily - 7 x weekly - 3 sets - 3 reps - 10=20 hold - Supine Bridge with Resistance Band  - 1 x daily - 7 x weekly - 3 sets - 10 reps - Supine March  - 1 x daily - 7 x weekly - 3 sets - 10 reps  ASSESSMENT:  CLINICAL IMPRESSION:  The patient is  making progress. She has had very little pain over the past few days. She has been walking for the most part without a cane.  She has pain at times. Her strength numbers have improved significantly.  OBJECTIVE IMPAIRMENTS: Abnormal gait, decreased activity tolerance, difficulty walking, decreased ROM, decreased strength, and pain.   ACTIVITY LIMITATIONS: sitting, standing, squatting, stairs, and locomotion level  PARTICIPATION LIMITATIONS: meal prep, cleaning, laundry, shopping, community activity, and yard work  PERSONAL FACTORS: 1-2 comorbidities: osteopetrosis  are also affecting patient's functional outcome.   REHAB  POTENTIAL: Good  CLINICAL DECISION MAKING: Evolving/moderate complexity increased difficulty transferring from sit to stand and then ambulating  EVALUATION COMPLEXITY: Moderate   GOALS: Goals reviewed with patient? Yes  SHORT TERM GOALS: Target date: 08/17/2022   Patient will increase bilateral lower extremity strength by 5 pounds Baseline: Goal status: INITIAL  2.  Patient will report a 50% reduction in stiffness with transfer from sit to stand Baseline:  Goal status: IN PROGRESS 8/15  3.  Patient will be independent with HEP Baseline:  Goal status: MET 10/9  LONG TERM GOALS: Target date: 09/07/2022    The patient will go up and down 6 steps with reciprocal pattern  Baseline:  Goal status: IN PROGRESS 10/9  2.  Patient will stand and walk without stiffness  Baseline:  Goal status: IN PROGRESS 10/9  3.  Patient will be independent with complete HEP Baseline:  Goal status: INITIAL   PLAN:  PT FREQUENCY: 1-2x/week  PT DURATION: 6 weeks  PLANNED INTERVENTIONS: Therapeutic exercises, Therapeutic activity, Neuromuscular re-education, Balance training, Gait training, Patient/Family education, Self Care, Joint mobilization, Stair training, DME instructions, Aquatic Therapy, Dry Needling, Cryotherapy, Moist heat, and Manual therapy  PLAN FOR NEXT SESSION: Patient has a gym membership but reports she just goes for line dancing.  Patient given stretches previous visit.  Next visit focus on expanding supine exercises.  Add sit to stand transfers.  Review tolerance to stretching and HEP.  Continue with core stabilization exercises as tolerated.  Consider standing series of core stabilization as it may be easier for her to do at home rather than having to lie down.   Dessie Coma, PT 11/10/2022, 1:56 PM

## 2022-11-11 ENCOUNTER — Encounter (HOSPITAL_BASED_OUTPATIENT_CLINIC_OR_DEPARTMENT_OTHER): Payer: Self-pay | Admitting: Physical Therapy

## 2022-11-12 ENCOUNTER — Encounter (HOSPITAL_BASED_OUTPATIENT_CLINIC_OR_DEPARTMENT_OTHER): Payer: Self-pay

## 2022-11-12 ENCOUNTER — Ambulatory Visit (HOSPITAL_BASED_OUTPATIENT_CLINIC_OR_DEPARTMENT_OTHER): Payer: Medicare Other

## 2022-11-12 DIAGNOSIS — R2689 Other abnormalities of gait and mobility: Secondary | ICD-10-CM

## 2022-11-12 NOTE — Therapy (Signed)
OUTPATIENT PHYSICAL THERAPY LOWER EXTREMITY TREATMENT        Patient Name: Patricia Hoover MRN: [id number] DOB:[birth year], 77 y.o., female Today's Date: 11/12/2022  END OF SESSION:  PT End of Session - 11/12/22 1307     Visit Number 17    Number of Visits 20    Date for PT Re-Evaluation 11/24/22    PT Start Time 1303    PT Stop Time 1341    PT Time Calculation (min) 38 min    Activity Tolerance Patient tolerated treatment well    Behavior During Therapy [facility] for tasks assessed/performed                         Past Medical History:  Diagnosis Date   Arrhythmia    possible hx, resolved prev   Blood transfusion without reported diagnosis 1972   had reaction; had to stop   Carpal tunnel syndrome    Cataract 2019   resolved with surgery   Duodenal ulcer    H/O exercise stress test 2012   normal   Heart murmur    as child   High cholesterol    History of kidney stones    Hx of colonic polyps    Hypertension    Osteoporosis 2010   t score - 3.9   Renal stones    Past Surgical History:  Procedure Laterality Date   ABDOMINAL HYSTERECTOMY  1972   for endometriosis   CATARACT EXTRACTION W/ INTRAOCULAR LENS IMPLANT Right 06/2017   CATARACT EXTRACTION W/ INTRAOCULAR LENS IMPLANT Left 07/2017   COLONOSCOPY Left 03/02/2017   Procedure: COLONOSCOPY;  Surgeon: Pasty Spillers, MD;  Location: [facility] ENDOSCOPY;  Service: Endoscopy;  Laterality: Left;   COLONOSCOPY WITH PROPOFOL N/A 05/04/2016   Procedure: COLONOSCOPY WITH PROPOFOL;  Surgeon: Wyline Mood, MD;  Location: [facility] ENDOSCOPY;  Service: Endoscopy;  Laterality: N/A;   ESOPHAGOGASTRODUODENOSCOPY Left 03/02/2017   Procedure: ESOPHAGOGASTRODUODENOSCOPY (EGD);  Surgeon: Pasty Spillers, MD;  Location: [facility] ENDOSCOPY;  Service: Endoscopy;  Laterality: Left;   ESOPHAGOGASTRODUODENOSCOPY (EGD) WITH PROPOFOL N/A 04/08/2017   Procedure: ESOPHAGOGASTRODUODENOSCOPY (EGD) WITH PROPOFOL;  Surgeon: Pasty Spillers, MD;  Location: [facility] ENDOSCOPY;  Service: Endoscopy;  Laterality: N/A;   LITHOTRIPSY     for kidney stone x5   LITHOTRIPSY  09/2017   Patient Active Problem List   Diagnosis Date Noted   Dysuria 04/21/2022   Stiff-legged gait 04/21/2022   Scalp lesion 09/21/2021   Clavicle enlargement 04/05/2021   Hyperpigmentation 03/01/2021   Vitamin D deficiency 03/01/2021   Leg pain 05/21/2019   Right leg pain 05/21/2019   Healthcare maintenance 01/08/2018   Paresthesia 01/08/2018   Neck pain 08/31/2017   Angioedema 06/15/2017   Peptic ulcer of duodenum    Other specified disease of esophagus    Hematochezia    External hemorrhoids    Diverticulosis of large intestine without diverticulitis    Internal hemorrhoids    Duodenal ulceration    Advance care planning 12/23/2016   Renal stone 04/20/2016   Heartburn 12/17/2015   Medicare annual wellness visit, subsequent 06/05/2013   Hyperglycemia 06/05/2013   Knee pain 08/31/2012   Osteoporosis 08/18/2012   HTN (hypertension) 08/09/2012   HLD (hyperlipidemia) 08/09/2012   Progress Note Reporting Period 8/20 to 10/23  See note below for Objective Data and Assessment of Progress/Goals.     PCP: Dr Crawford Givens   REFERRING PROVIDER: Dr Crawford Givens   REFERRING DIAG:  Diagnosis  R26.89 (ICD-10-CM) - Stiff-legged gait    THERAPY DIAG:  Other abnormalities of gait and mobility  Rationale for Evaluation and Treatment: Rehabilitation  ONSET DATE:   SUBJECTIVE:   SUBJECTIVE STATEMENT: Pt reports her L knee pain comes and goes. Feels good today.    PERTINENT HISTORY: Osteoporosis 2010 t score - 3.9  Carpal tunnel syndrome   PAIN:  Are you having pain? PAIN:  Are you having pain? No   PRECAUTIONS: None  WEIGHT BEARING RESTRICTIONS: No  FALLS:  Has patient fallen in last 6 months? No  LIVING ENVIRONMENT: Has steps.   OCCUPATION:  Retired   Hobbies:   Health visitor   PLOF: Independent  PATIENT  GOALS:  To be able to walk better    NEXT MD VISIT:  Nothing scheduled   OBJECTIVE:   DIAGNOSTIC FINDINGS:  MRI: 1. At L4-5 there is a broad-based disc bulge. Severe bilateral facet arthropathy with ligamentum flavum infolding. Bilateral lateral recess narrowing. Mild bilateral foraminal stenosis. Mild grade 1 anterolisthesis of L4 on L5 secondary to facet disease. PATIENT SURVEYS:  FOTO    COGNITION: Overall cognitive status: Within functional limits for tasks assessed     SENSATION: Denies paresthesias   EDEMA:   MUSCLE LENGTH:  POSTURE: No Significant postural limitations  PALPATION: Tender to palpation in lower lumbar paraspinals   LOWER EXTREMITY ROM:  Active ROM Right eval Left eval  Hip flexion    Hip extension    Hip abduction    Hip adduction    Hip internal rotation    Hip external rotation    Knee flexion    Knee extension    Ankle dorsiflexion    Ankle plantarflexion    Ankle inversion    Ankle eversion     (Blank rows = not tested)  LUMBAR ROM:   Active  A/PROM  eval  Flexion   Extension   Right lateral flexion   Left lateral flexion   Right rotation   Left rotation    (Blank rows = not tested)   LOWER EXTREMITY MMT:  MMT Right eval Left eval Right  8/20 Left  8/20 Right  Left  Right  10/23 Left  10/23  Hip flexion 20.9 12.9 17.2 17.0 26.9 21.9 28.5 25.2  Hip extension          Hip abduction 26.9 29.8 24.5 28.1 29.1 29.5 31.4 30.8  Hip adduction          Hip internal rotation          Hip external rotation          Knee flexion          Knee extension 22.8 18.3 29.8 22.9 27.8 17.5 26.8 30.7  Ankle dorsiflexion          Ankle plantarflexion          Ankle inversion          Ankle eversion           (Blank rows = not tested)    GAIT: Significant lateral shift to the right with ambulation.  Decreased bilateral knee bend with ambulation.  TODAY'S TREATMENT:  DATE:   10/25  Nu-step 5 min L4 Sit to stands 3x10 from chair HR/TR 2x15 Standing abd/ext RTB at ankles 3x10ea Step ups- bottom stair 2x10ea (cues for knee flexion with eccentric control)   10/23 Nu-step 4 min L4  LAQ 2x10 3lbs bil HSC RTB 2x10bil  Sit to stands 3x10 from chair  HR/TR 2x15 Standing abd/ext RTB at ankles 2x10ea Step ups- bottom stair 2x10ea (cues for knee flexion with eccentric control) Side stepping with squat at rail with RTB at ankles x2 laps  Measured muscle strength   10/18  Nu-step 5 min L4  LAQ 2x10 3lbs bil HSC RTB 2x10bil Sit to stands 3x10 from chair HR/TR 2x15 Standing abd/ext RTB at ankles 3x10ea Step ups- bottom stair 2x10ea (cues for knee flexion with eccentric control) Side stepping with squat at rail with RTB at ankles x2 laps   10/15  Nu-step 6 min L4  LAQ 2x10 3lbs bil HSC RTB 2x10bil Sit to stands 3x10 from chair HR/TR 2x15 Standing abd/ext RTB at ankles 3x10ea Step ups- bottom stair 2x10ea (cues for knee flexion with eccentric control) Side stepping with squat at rail with RTB at ankles x2 laps Walking marches 1/2 hall x2 [place] on floor no UE support 2x10  10/11  Nu-step 6 min L4  LAQ 2x10 2.5lbs bil HSC RTB 2x10bil Sit to stands 2x10 from chair HR/TR 2x15 Step ups- bottom stair 2x10ea (cues for knee flexion with eccentric control) Side stepping with squat at rail with RTB at ankles x2 laps Walking marches 1/2 hall x2 [place] on floor no UE support    10/9  Nu-step 5 min L4  LAQ 2x10 2.5lbs bil HSC RTB 2x10L Sit to stands 2x10 from chair Step ups- bottom stair 2x10ea (cues for knee flexion with eccentric control) Side stepping with squat at rail with RTB at ankles x3 laps Walking marches 1/2 hall x2 Standing marches 2x10 [place] on floor no UE support Airex ant/post wgt shifts x10   10/4   Nu-step 5 min L5  LAQ 2x10 2.5lbs  bil HSC RTB 2x10L Sit to stands 2x10 Step ups- bottom stair x10ea (cues for knee flexion) Side stepping with squat at rail with RTB at ankles x1 lap Walking marches 1/2 hall x2 Standing marches 2x10 [place] on floor no UE support Airex ant/post wgt shifts x10  10/2   Nu-step 5 min   Strength testing and review of goals   SAQ 3x10 SLR 3x10  Birdge 3x10  LAQ 2x10   Standing slow march with right LE x5 min   Manual: trigger point release to posterior knee. Patella mobilization    PATIENT EDUCATION:  Education details: HEP, symptom management,  Person educated: Patient Education method: Explanation, Demonstration, Tactile cues, Verbal cues, and Handouts Education comprehension: verbalized understanding, returned demonstration, verbal cues required, tactile cues required, and needs further education  HOME EXERCISE PROGRAM: Access Code: [email or web address] URL: [email or web address] Date: 07/27/2022 Prepared by: Lorayne Bender  Exercises - Supine Piriformis Stretch with Foot on Ground  - 1 x daily - 7 x weekly - 3 sets - 3 reps - 20 sec  hold - Seated Bilateral Shoulder Flexion Towel Slide at Table Top  - 1 x daily - 7 x weekly - 3 sets - 3 reps - 20 sec  hold - Seated Hamstring Stretch  - 1 x daily - 7 x weekly - 3 sets - 3 reps - 10=20 hold - Supine Bridge with Resistance Band  - 1 x daily - 7 x  weekly - 3 sets - 10 reps - Supine March  - 1 x daily - 7 x weekly - 3 sets - 10 reps  ASSESSMENT:  CLINICAL IMPRESSION:  Pt requested to end session early to be somewhere. Able to complete step ups at bottom stair without c/o significant pain. She continues to ambulate with poor knee flexion in swing phase, but can correct this with cuing. Continues to require cuing for postural corrections with standing exercises. Pt to attend 2 additional PT sessions and then DC to HEP.  OBJECTIVE IMPAIRMENTS: Abnormal gait, decreased activity tolerance, difficulty walking, decreased ROM,  decreased strength, and pain.   ACTIVITY LIMITATIONS: sitting, standing, squatting, stairs, and locomotion level  PARTICIPATION LIMITATIONS: meal prep, cleaning, laundry, shopping, community activity, and yard work  PERSONAL FACTORS: 1-2 comorbidities: osteopetrosis  are also affecting patient's functional outcome.   REHAB POTENTIAL: Good  CLINICAL DECISION MAKING: Evolving/moderate complexity increased difficulty transferring from sit to stand and then ambulating  EVALUATION COMPLEXITY: Moderate   GOALS: Goals reviewed with patient? Yes  SHORT TERM GOALS: Target date: 08/17/2022   Patient will increase bilateral lower extremity strength by 5 pounds Baseline: Goal status: INITIAL  2.  Patient will report a 50% reduction in stiffness with transfer from sit to stand Baseline:  Goal status: IN PROGRESS 8/15  3.  Patient will be independent with HEP Baseline:  Goal status: MET 10/9  LONG TERM GOALS: Target date: 09/07/2022    The patient will go up and down 6 steps with reciprocal pattern  Baseline:  Goal status: IN PROGRESS 10/9  2.  Patient will stand and walk without stiffness  Baseline:  Goal status: IN PROGRESS 10/9  3.  Patient will be independent with complete HEP Baseline:  Goal status: Finalizing HEP within the next 2 weeks 10/24   PLAN:  PT FREQUENCY: 1-2x/week  PT DURATION: 6 weeks  PLANNED INTERVENTIONS: Therapeutic exercises, Therapeutic activity, Neuromuscular re-education, Balance training, Gait training, Patient/Family education, Self Care, Joint mobilization, Stair training, DME instructions, Aquatic Therapy, Dry Needling, Cryotherapy, Moist heat, and Manual therapy  PLAN FOR NEXT SESSION: Patient has a gym membership but reports she just goes for line dancing.  Patient given stretches previous visit.  Next visit focus on expanding supine exercises.  Add sit to stand transfers.  Review tolerance to stretching and HEP.  Continue with core  stabilization exercises as tolerated.  Consider standing series of core stabilization as it may be easier for her to do at home rather than having to lie down.   Donnel Saxon Anieya Helman, PTA 11/12/2022, 1:45 PM

## 2022-11-17 ENCOUNTER — Ambulatory Visit (HOSPITAL_BASED_OUTPATIENT_CLINIC_OR_DEPARTMENT_OTHER): Payer: Medicare Other | Admitting: Physical Therapy

## 2022-11-17 DIAGNOSIS — R2689 Other abnormalities of gait and mobility: Secondary | ICD-10-CM | POA: Diagnosis not present

## 2022-11-17 NOTE — Therapy (Signed)
OUTPATIENT PHYSICAL THERAPY LOWER EXTREMITY TREATMENT        Patient Name: Patricia Hoover MRN: [id number] DOB:[birth year], 77 y.o., female Today's Date: 11/17/2022  END OF SESSION:                Past Medical History:  Diagnosis Date   Arrhythmia    possible hx, resolved prev   Blood transfusion without reported diagnosis 1972   had reaction; had to stop   Carpal tunnel syndrome    Cataract 2019   resolved with surgery   Duodenal ulcer    H/O exercise stress test 2012   normal   Heart murmur    as child   High cholesterol    History of kidney stones    Hx of colonic polyps    Hypertension    Osteoporosis 2010   t score - 3.9   Renal stones    Past Surgical History:  Procedure Laterality Date   ABDOMINAL HYSTERECTOMY  1972   for endometriosis   CATARACT EXTRACTION W/ INTRAOCULAR LENS IMPLANT Right 06/2017   CATARACT EXTRACTION W/ INTRAOCULAR LENS IMPLANT Left 07/2017   COLONOSCOPY Left 03/02/2017   Procedure: COLONOSCOPY;  Surgeon: Pasty Spillers, MD;  Location: [facility] ENDOSCOPY;  Service: Endoscopy;  Laterality: Left;   COLONOSCOPY WITH PROPOFOL N/A 05/04/2016   Procedure: COLONOSCOPY WITH PROPOFOL;  Surgeon: Wyline Mood, MD;  Location: [facility] ENDOSCOPY;  Service: Endoscopy;  Laterality: N/A;   ESOPHAGOGASTRODUODENOSCOPY Left 03/02/2017   Procedure: ESOPHAGOGASTRODUODENOSCOPY (EGD);  Surgeon: Pasty Spillers, MD;  Location: [facility] ENDOSCOPY;  Service: Endoscopy;  Laterality: Left;   ESOPHAGOGASTRODUODENOSCOPY (EGD) WITH PROPOFOL N/A 04/08/2017   Procedure: ESOPHAGOGASTRODUODENOSCOPY (EGD) WITH PROPOFOL;  Surgeon: Pasty Spillers, MD;  Location: [facility] ENDOSCOPY;  Service: Endoscopy;  Laterality: N/A;   LITHOTRIPSY     for kidney stone x5   LITHOTRIPSY  09/2017   Patient Active Problem List   Diagnosis Date Noted   Dysuria 04/21/2022   Stiff-legged gait 04/21/2022   Scalp lesion 09/21/2021   Clavicle enlargement 04/05/2021   Hyperpigmentation  03/01/2021   Vitamin D deficiency 03/01/2021   Leg pain 05/21/2019   Right leg pain 05/21/2019   Healthcare maintenance 01/08/2018   Paresthesia 01/08/2018   Neck pain 08/31/2017   Angioedema 06/15/2017   Peptic ulcer of duodenum    Other specified disease of esophagus    Hematochezia    External hemorrhoids    Diverticulosis of large intestine without diverticulitis    Internal hemorrhoids    Duodenal ulceration    Advance care planning 12/23/2016   Renal stone 04/20/2016   Heartburn 12/17/2015   Medicare annual wellness visit, subsequent 06/05/2013   Hyperglycemia 06/05/2013   Knee pain 08/31/2012   Osteoporosis 08/18/2012   HTN (hypertension) 08/09/2012   HLD (hyperlipidemia) 08/09/2012   Progress Note Reporting Period 8/20 to 10/23  See note below for Objective Data and Assessment of Progress/Goals.     PCP: Dr Crawford Givens   REFERRING PROVIDER: Dr Crawford Givens   REFERRING DIAG:  Diagnosis  R26.89 (ICD-10-CM) - Stiff-legged gait    THERAPY DIAG:  No diagnosis found.  Rationale for Evaluation and Treatment: Rehabilitation  ONSET DATE:   SUBJECTIVE:   SUBJECTIVE STATEMENT: The patient is using the cane first thing in the mooring. She otherwise is onlycaryring it in case she needs it. She Is having very little pain.    PERTINENT HISTORY: Osteoporosis 2010 t score - 3.9  Carpal tunnel syndrome   PAIN:  Are you having pain?  PAIN:  Are you having pain? No   PRECAUTIONS: None  WEIGHT BEARING RESTRICTIONS: No  FALLS:  Has patient fallen in last 6 months? No  LIVING ENVIRONMENT: Has steps.   OCCUPATION:  Retired   Hobbies:   Health visitor   PLOF: Independent  PATIENT GOALS:  To be able to walk better    NEXT MD VISIT:  Nothing scheduled   OBJECTIVE:   DIAGNOSTIC FINDINGS:  MRI: 1. At L4-5 there is a broad-based disc bulge. Severe bilateral facet arthropathy with ligamentum flavum infolding. Bilateral lateral recess  narrowing. Mild bilateral foraminal stenosis. Mild grade 1 anterolisthesis of L4 on L5 secondary to facet disease. PATIENT SURVEYS:  FOTO    COGNITION: Overall cognitive status: Within functional limits for tasks assessed     SENSATION: Denies paresthesias   EDEMA:   MUSCLE LENGTH:  POSTURE: No Significant postural limitations  PALPATION: Tender to palpation in lower lumbar paraspinals   LOWER EXTREMITY ROM:  Active ROM Right eval Left eval  Hip flexion    Hip extension    Hip abduction    Hip adduction    Hip internal rotation    Hip external rotation    Knee flexion    Knee extension    Ankle dorsiflexion    Ankle plantarflexion    Ankle inversion    Ankle eversion     (Blank rows = not tested)  LUMBAR ROM:   Active  A/PROM  eval  Flexion   Extension   Right lateral flexion   Left lateral flexion   Right rotation   Left rotation    (Blank rows = not tested)   LOWER EXTREMITY MMT:  MMT Right eval Left eval Right  8/20 Left  8/20 Right  Left  Right  10/23 Left  10/23  Hip flexion 20.9 12.9 17.2 17.0 26.9 21.9 28.5 25.2  Hip extension          Hip abduction 26.9 29.8 24.5 28.1 29.1 29.5 31.4 30.8  Hip adduction          Hip internal rotation          Hip external rotation          Knee flexion          Knee extension 22.8 18.3 29.8 22.9 27.8 17.5 26.8 30.7  Ankle dorsiflexion          Ankle plantarflexion          Ankle inversion          Ankle eversion           (Blank rows = not tested)    GAIT: Significant lateral shift to the right with ambulation.  Decreased bilateral knee bend with ambulation.  TODAY'S TREATMENT:                                                                                                                              DATE:  10/30  Nu-step 5 min L4   Quad sets 3 sec hold x15  SAQ 3x10 left 1.5 lbs  SLR 3x10   Standing heel /toe x20  Standing heel/tow on air-ex x20   Air-ex side step/ fowd step x20  each   Step up 6 inch 2x10  Lateral step up 2x10 4 inch         10/25  Nu-step 5 min L4 Sit to stands 3x10 from chair HR/TR 2x15 Standing abd/ext RTB at ankles 3x10ea Step ups- bottom stair 2x10ea (cues for knee flexion with eccentric control)   10/23 Nu-step 4 min L4  LAQ 2x10 3lbs bil HSC RTB 2x10bil  Sit to stands 3x10 from chair  HR/TR 2x15 Standing abd/ext RTB at ankles 2x10ea Step ups- bottom stair 2x10ea (cues for knee flexion with eccentric control) Side stepping with squat at rail with RTB at ankles x2 laps  Measured muscle strength   10/18  Nu-step 5 min L4  LAQ 2x10 3lbs bil HSC RTB 2x10bil Sit to stands 3x10 from chair HR/TR 2x15 Standing abd/ext RTB at ankles 3x10ea Step ups- bottom stair 2x10ea (cues for knee flexion with eccentric control) Side stepping with squat at rail with RTB at ankles x2 laps   10/15  Nu-step 6 min L4  LAQ 2x10 3lbs bil HSC RTB 2x10bil Sit to stands 3x10 from chair HR/TR 2x15 Standing abd/ext RTB at ankles 3x10ea Step ups- bottom stair 2x10ea (cues for knee flexion with eccentric control) Side stepping with squat at rail with RTB at ankles x2 laps Walking marches 1/2 hall x2 [place] on floor no UE support 2x10  10/11  Nu-step 6 min L4  LAQ 2x10 2.5lbs bil HSC RTB 2x10bil Sit to stands 2x10 from chair HR/TR 2x15 Step ups- bottom stair 2x10ea (cues for knee flexion with eccentric control) Side stepping with squat at rail with RTB at ankles x2 laps Walking marches 1/2 hall x2 [place] on floor no UE support    10/9  Nu-step 5 min L4  LAQ 2x10 2.5lbs bil HSC RTB 2x10L Sit to stands 2x10 from chair Step ups- bottom stair 2x10ea (cues for knee flexion with eccentric control) Side stepping with squat at rail with RTB at ankles x3 laps Walking marches 1/2 hall x2 Standing marches 2x10 [place] on floor no UE support Airex ant/post wgt shifts x10   10/4   Nu-step 5 min L5  LAQ 2x10 2.5lbs  bil HSC RTB 2x10L Sit to stands 2x10 Step ups- bottom stair x10ea (cues for knee flexion) Side stepping with squat at rail with RTB at ankles x1 lap Walking marches 1/2 hall x2 Standing marches 2x10 [place] on floor no UE support Airex ant/post wgt shifts x10  10/2   Nu-step 5 min   Strength testing and review of goals   SAQ 3x10 SLR 3x10  Birdge 3x10  LAQ 2x10   Standing slow march with right LE x5 min   Manual: trigger point release to posterior knee. Patella mobilization    PATIENT EDUCATION:  Education details: HEP, symptom management,  Person educated: Patient Education method: Explanation, Demonstration, Tactile cues, Verbal cues, and Handouts Education comprehension: verbalized understanding, returned demonstration, verbal cues required, tactile cues required, and needs further education  HOME EXERCISE PROGRAM: Access Code: [email or web address] URL: [email or web address] Date: 07/27/2022 Prepared by: Lorayne Bender  Exercises - Supine Piriformis Stretch with Foot on Ground  - 1 x daily - 7 x weekly - 3 sets - 3 reps - 20 sec  hold - Seated  Bilateral Shoulder Flexion Towel Slide at Table Top  - 1 x daily - 7 x weekly - 3 sets - 3 reps - 20 sec  hold - Seated Hamstring Stretch  - 1 x daily - 7 x weekly - 3 sets - 3 reps - 10=20 hold - Supine Bridge with Resistance Band  - 1 x daily - 7 x weekly - 3 sets - 10 reps - Supine March  - 1 x daily - 7 x weekly - 3 sets - 10 reps  ASSESSMENT:  CLINICAL IMPRESSION: The patient tolerated treatment well. She had no significant pain with her exercises. She did seem to have more stiffness in her knees following standing exercises. She is reaching max potential for therapy. We will likley D/C to HEP over the next few visits.    OBJECTIVE IMPAIRMENTS: Abnormal gait, decreased activity tolerance, difficulty walking, decreased ROM, decreased strength, and pain.   ACTIVITY LIMITATIONS: sitting, standing, squatting, stairs, and  locomotion level  PARTICIPATION LIMITATIONS: meal prep, cleaning, laundry, shopping, community activity, and yard work  PERSONAL FACTORS: 1-2 comorbidities: osteopetrosis  are also affecting patient's functional outcome.   REHAB POTENTIAL: Good  CLINICAL DECISION MAKING: Evolving/moderate complexity increased difficulty transferring from sit to stand and then ambulating  EVALUATION COMPLEXITY: Moderate   GOALS: Goals reviewed with patient? Yes  SHORT TERM GOALS: Target date: 08/17/2022   Patient will increase bilateral lower extremity strength by 5 pounds Baseline: Goal status: INITIAL  2.  Patient will report a 50% reduction in stiffness with transfer from sit to stand Baseline:  Goal status: IN PROGRESS 8/15  3.  Patient will be independent with HEP Baseline:  Goal status: MET 10/9  LONG TERM GOALS: Target date: 09/07/2022    The patient will go up and down 6 steps with reciprocal pattern  Baseline:  Goal status: IN PROGRESS 10/9  2.  Patient will stand and walk without stiffness  Baseline:  Goal status: IN PROGRESS 10/9  3.  Patient will be independent with complete HEP Baseline:  Goal status: Finalizing HEP within the next 2 weeks 10/24   PLAN:  PT FREQUENCY: 1-2x/week  PT DURATION: 6 weeks  PLANNED INTERVENTIONS: Therapeutic exercises, Therapeutic activity, Neuromuscular re-education, Balance training, Gait training, Patient/Family education, Self Care, Joint mobilization, Stair training, DME instructions, Aquatic Therapy, Dry Needling, Cryotherapy, Moist heat, and Manual therapy  PLAN FOR NEXT SESSION: Patient has a gym membership but reports she just goes for line dancing.  Patient given stretches previous visit.  Next visit focus on expanding supine exercises.  Add sit to stand transfers.  Review tolerance to stretching and HEP.  Continue with core stabilization exercises as tolerated.  Consider standing series of core stabilization as it may be easier for  her to do at home rather than having to lie down.   Dessie Coma, PT 11/17/2022, 1:48 PM

## 2022-11-19 ENCOUNTER — Encounter (HOSPITAL_BASED_OUTPATIENT_CLINIC_OR_DEPARTMENT_OTHER): Payer: Self-pay | Admitting: Physical Therapy

## 2022-11-19 ENCOUNTER — Ambulatory Visit (HOSPITAL_BASED_OUTPATIENT_CLINIC_OR_DEPARTMENT_OTHER): Payer: Medicare Other | Attending: Family Medicine | Admitting: Physical Therapy

## 2022-11-19 ENCOUNTER — Telehealth: Payer: Self-pay | Admitting: Family Medicine

## 2022-11-19 DIAGNOSIS — R2689 Other abnormalities of gait and mobility: Secondary | ICD-10-CM | POA: Diagnosis not present

## 2022-11-19 DIAGNOSIS — M79606 Pain in leg, unspecified: Secondary | ICD-10-CM

## 2022-11-19 NOTE — Telephone Encounter (Signed)
Pt would like for a call back concerning her back of left knee pt would also like a referral   to a specialist pt can be reached at [phone]

## 2022-11-19 NOTE — Telephone Encounter (Signed)
Will route to PCP since patient has been seen for issue prior.

## 2022-11-19 NOTE — Therapy (Signed)
OUTPATIENT PHYSICAL THERAPY LOWER EXTREMITY TREATMENT        Patient Name: Patricia Hoover MRN: [id number] DOB:December 09, 1945, 77 y.o., female Today's Date: 11/19/2022  END OF SESSION:  PT End of Session - 11/19/22 1402     Visit Number 19    Number of Visits 20    Date for PT Re-Evaluation 11/24/22    PT Start Time 1345    PT Stop Time 1427    PT Time Calculation (min) 42 min    Activity Tolerance Patient tolerated treatment well    Behavior During Therapy [facility] for tasks assessed/performed                          Past Medical History:  Diagnosis Date   Arrhythmia    possible hx, resolved prev   Blood transfusion without reported diagnosis 1972   had reaction; had to stop   Carpal tunnel syndrome    Cataract 2019   resolved with surgery   Duodenal ulcer    H/O exercise stress test 2012   normal   Heart murmur    as child   High cholesterol    History of kidney stones    Hx of colonic polyps    Hypertension    Osteoporosis 2010   t score - 3.9   Renal stones    Past Surgical History:  Procedure Laterality Date   ABDOMINAL HYSTERECTOMY  1972   for endometriosis   CATARACT EXTRACTION W/ INTRAOCULAR LENS IMPLANT Right 06/2017   CATARACT EXTRACTION W/ INTRAOCULAR LENS IMPLANT Left 07/2017   COLONOSCOPY Left 03/02/2017   Procedure: COLONOSCOPY;  Surgeon: Pasty Spillers, MD;  Location: [facility] ENDOSCOPY;  Service: Endoscopy;  Laterality: Left;   COLONOSCOPY WITH PROPOFOL N/A 05/04/2016   Procedure: COLONOSCOPY WITH PROPOFOL;  Surgeon: Wyline Mood, MD;  Location: [facility] ENDOSCOPY;  Service: Endoscopy;  Laterality: N/A;   ESOPHAGOGASTRODUODENOSCOPY Left 03/02/2017   Procedure: ESOPHAGOGASTRODUODENOSCOPY (EGD);  Surgeon: Pasty Spillers, MD;  Location: [facility] ENDOSCOPY;  Service: Endoscopy;  Laterality: Left;   ESOPHAGOGASTRODUODENOSCOPY (EGD) WITH PROPOFOL N/A 04/08/2017   Procedure: ESOPHAGOGASTRODUODENOSCOPY (EGD) WITH PROPOFOL;  Surgeon: Pasty Spillers, MD;  Location: [facility] ENDOSCOPY;  Service: Endoscopy;  Laterality: N/A;   LITHOTRIPSY     for kidney stone x5   LITHOTRIPSY  09/2017   Patient Active Problem List   Diagnosis Date Noted   Dysuria 04/21/2022   Stiff-legged gait 04/21/2022   Scalp lesion 09/21/2021   Clavicle enlargement 04/05/2021   Hyperpigmentation 03/01/2021   Vitamin D deficiency 03/01/2021   Leg pain 05/21/2019   Right leg pain 05/21/2019   Healthcare maintenance 01/08/2018   Paresthesia 01/08/2018   Neck pain 08/31/2017   Angioedema 06/15/2017   Peptic ulcer of duodenum    Other specified disease of esophagus    Hematochezia    External hemorrhoids    Diverticulosis of large intestine without diverticulitis    Internal hemorrhoids    Duodenal ulceration    Advance care planning 12/23/2016   Renal stone 04/20/2016   Heartburn 12/17/2015   Medicare annual wellness visit, subsequent 06/05/2013   Hyperglycemia 06/05/2013   Knee pain 08/31/2012   Osteoporosis 08/18/2012   HTN (hypertension) 08/09/2012   HLD (hyperlipidemia) 08/09/2012   Progress Note Reporting Period 8/20 to 10/23  See note below for Objective Data and Assessment of Progress/Goals.     PCP: Dr Crawford Givens   REFERRING PROVIDER: Dr Crawford Givens   REFERRING DIAG:  Diagnosis  R26.89 (ICD-10-CM) - Stiff-legged gait    THERAPY DIAG:  Other abnormalities of gait and mobility  Rationale for Evaluation and Treatment: Rehabilitation  ONSET DATE:   SUBJECTIVE:   SUBJECTIVE STATEMENT: The patient is using the cane first thing in the mooring. She otherwise is onlycaryring it in case she needs it. She Is having very little pain.    PERTINENT HISTORY: Osteoporosis 2010 t score - 3.9  Carpal tunnel syndrome   PAIN:  Are you having pain? PAIN:  Are you having pain? No   PRECAUTIONS: None  WEIGHT BEARING RESTRICTIONS: No  FALLS:  Has patient fallen in last 6 months? No  LIVING ENVIRONMENT: Has steps.   OCCUPATION:   Retired   Hobbies:   Health visitor   PLOF: Independent  PATIENT GOALS:  To be able to walk better    NEXT MD VISIT:  Nothing scheduled   OBJECTIVE:   DIAGNOSTIC FINDINGS:  MRI: 1. At L4-5 there is a broad-based disc bulge. Severe bilateral facet arthropathy with ligamentum flavum infolding. Bilateral lateral recess narrowing. Mild bilateral foraminal stenosis. Mild grade 1 anterolisthesis of L4 on L5 secondary to facet disease. PATIENT SURVEYS:  FOTO    COGNITION: Overall cognitive status: Within functional limits for tasks assessed     SENSATION: Denies paresthesias   EDEMA:   MUSCLE LENGTH:  POSTURE: No Significant postural limitations  PALPATION: Tender to palpation in lower lumbar paraspinals   LOWER EXTREMITY ROM:  Active ROM Right eval Left eval  Hip flexion    Hip extension    Hip abduction    Hip adduction    Hip internal rotation    Hip external rotation    Knee flexion    Knee extension    Ankle dorsiflexion    Ankle plantarflexion    Ankle inversion    Ankle eversion     (Blank rows = not tested)  LUMBAR ROM:   Active  A/PROM  eval  Flexion   Extension   Right lateral flexion   Left lateral flexion   Right rotation   Left rotation    (Blank rows = not tested)   LOWER EXTREMITY MMT:  MMT Right eval Left eval Right  8/20 Left  8/20 Right  Left  Right  10/23 Left  10/23  Hip flexion 20.9 12.9 17.2 17.0 26.9 21.9 28.5 25.2  Hip extension          Hip abduction 26.9 29.8 24.5 28.1 29.1 29.5 31.4 30.8  Hip adduction          Hip internal rotation          Hip external rotation          Knee flexion          Knee extension 22.8 18.3 29.8 22.9 27.8 17.5 26.8 30.7  Ankle dorsiflexion          Ankle plantarflexion          Ankle inversion          Ankle eversion           (Blank rows = not tested)    GAIT: Significant lateral shift to the right with ambulation.  Decreased bilateral knee bend with  ambulation.  TODAY'S TREATMENT:  DATE:  11/1 Nu-step 5 min L4   Quad sets 3 sec hold x15  SAQ 3x10 left 1.5 lbs  SLR 3x10   Standing heel /toe x20  Standing heel/tow on air-ex x20  Standing march x15  Lateral steps 2x15    Step up 6 inch 2x10  Lateral step up 2x10 4 inch   10/30  Nu-step 5 min L4   Quad sets 3 sec hold x15  SAQ 3x10 left 1.5 lbs  SLR 3x10   Standing heel /toe x20  Standing heel/tow on air-ex x20   Air-ex side step/ fowd step x20 each   Step up 6 inch 2x10  Lateral step up 2x10 4 inch         10/25  Nu-step 5 min L4 Sit to stands 3x10 from chair HR/TR 2x15 Standing abd/ext RTB at ankles 3x10ea Step ups- bottom stair 2x10ea (cues for knee flexion with eccentric control)   10/23 Nu-step 4 min L4  LAQ 2x10 3lbs bil HSC RTB 2x10bil  Sit to stands 3x10 from chair  HR/TR 2x15 Standing abd/ext RTB at ankles 2x10ea Step ups- bottom stair 2x10ea (cues for knee flexion with eccentric control) Side stepping with squat at rail with RTB at ankles x2 laps  Measured muscle strength   10/18  Nu-step 5 min L4  LAQ 2x10 3lbs bil HSC RTB 2x10bil Sit to stands 3x10 from chair HR/TR 2x15 Standing abd/ext RTB at ankles 3x10ea Step ups- bottom stair 2x10ea (cues for knee flexion with eccentric control) Side stepping with squat at rail with RTB at ankles x2 laps   10/15  Nu-step 6 min L4  LAQ 2x10 3lbs bil HSC RTB 2x10bil Sit to stands 3x10 from chair HR/TR 2x15 Standing abd/ext RTB at ankles 3x10ea Step ups- bottom stair 2x10ea (cues for knee flexion with eccentric control) Side stepping with squat at rail with RTB at ankles x2 laps Walking marches 1/2 hall x2 [place] on floor no UE support 2x10  10/11  Nu-step 6 min L4  LAQ 2x10 2.5lbs bil HSC RTB 2x10bil Sit to stands 2x10 from chair HR/TR 2x15 Step  ups- bottom stair 2x10ea (cues for knee flexion with eccentric control) Side stepping with squat at rail with RTB at ankles x2 laps Walking marches 1/2 hall x2 [place] on floor no UE support    10/9  Nu-step 5 min L4  LAQ 2x10 2.5lbs bil HSC RTB 2x10L Sit to stands 2x10 from chair Step ups- bottom stair 2x10ea (cues for knee flexion with eccentric control) Side stepping with squat at rail with RTB at ankles x3 laps Walking marches 1/2 hall x2 Standing marches 2x10 [place] on floor no UE support Airex ant/post wgt shifts x10    PATIENT EDUCATION:  Education details: HEP, symptom management,  Person educated: Patient Education method: Explanation, Demonstration, Tactile cues, Verbal cues, and Handouts Education comprehension: verbalized understanding, returned demonstration, verbal cues required, tactile cues required, and needs further education  HOME EXERCISE PROGRAM: Access Code: [email or web address] URL: [email or web address] Date: 07/27/2022 Prepared by: Lorayne Bender  Exercises - Supine Piriformis Stretch with Foot on Ground  - 1 x daily - 7 x weekly - 3 sets - 3 reps - 20 sec  hold - Seated Bilateral Shoulder Flexion Towel Slide at Table Top  - 1 x daily - 7 x weekly - 3 sets - 3 reps - 20 sec  hold - Seated Hamstring Stretch  - 1 x daily - 7 x weekly - 3 sets - 3 reps - 10=20  hold - Supine Bridge with Resistance Band  - 1 x daily - 7 x weekly - 3 sets - 10 reps - Supine March  - 1 x daily - 7 x weekly - 3 sets - 10 reps  ASSESSMENT:  CLINICAL IMPRESSION: The patient has reached max benefit fro physical therapy. She has progressed well depsite a new onset of knee pain during her POC. We have reviewed exercises that work both her knee and low back. She will see an orthopedic soon about her knee . We reviewed her complete HEP.  See below for goal specific progress.  OBJECTIVE IMPAIRMENTS: Abnormal gait, decreased activity tolerance, difficulty walking, decreased  ROM, decreased strength, and pain.   ACTIVITY LIMITATIONS: sitting, standing, squatting, stairs, and locomotion level  PARTICIPATION LIMITATIONS: meal prep, cleaning, laundry, shopping, community activity, and yard work  PERSONAL FACTORS: 1-2 comorbidities: osteopetrosis  are also affecting patient's functional outcome.   REHAB POTENTIAL: Good  CLINICAL DECISION MAKING: Evolving/moderate complexity increased difficulty transferring from sit to stand and then ambulating  EVALUATION COMPLEXITY: Moderate   GOALS: Goals reviewed with patient? Yes  SHORT TERM GOALS: Target date: 08/17/2022   Patient will increase bilateral lower extremity strength by 5 pounds Baseline: Goal status: achieved 11/1  2.  Patient will report a 50% reduction in stiffness with transfer from sit to stand Baseline:  Goal status: achieved. No stiffness if she does her exercises   3.  Patient will be independent with HEP Baseline:  Goal status: MET 10/9  LONG TERM GOALS: Target date: 09/07/2022    The patient will go up and down 6 steps with reciprocal pattern  Baseline:  Goal status: IN PROGRESS 10/9  2.  Patient will stand and walk without stiffness  Baseline:  Goal status: IN PROGRESS 10/9  3.  Patient will be independent with complete HEP Baseline:  Goal status: finalized 11/1   PLAN:  PT FREQUENCY: 1-2x/week  PT DURATION: 6 weeks  PLANNED INTERVENTIONS: Therapeutic exercises, Therapeutic activity, Neuromuscular re-education, Balance training, Gait training, Patient/Family education, Self Care, Joint mobilization, Stair training, DME instructions, Aquatic Therapy, Dry Needling, Cryotherapy, Moist heat, and Manual therapy  PLAN FOR NEXT SESSION: Patient has a gym membership but reports she just goes for line dancing.  Patient given stretches previous visit.  Next visit focus on expanding supine exercises.  Add sit to stand transfers.  Review tolerance to stretching and HEP.  Continue with  core stabilization exercises as tolerated.  Consider standing series of core stabilization as it may be easier for her to do at home rather than having to lie down.   Dessie Coma, PT 11/19/2022, 2:04 PM

## 2022-11-24 NOTE — Addendum Note (Signed)
Addended by: Joaquim Nam on: 11/24/2022 01:38 PM   Modules accepted: Orders

## 2022-11-24 NOTE — Telephone Encounter (Signed)
I put in the referral for ortho.  Please update patient.  Thanks.

## 2022-11-24 NOTE — Telephone Encounter (Signed)
Left message to return call to our office.  

## 2022-11-25 ENCOUNTER — Encounter: Payer: Self-pay | Admitting: Family Medicine

## 2022-11-25 NOTE — Telephone Encounter (Signed)
error 

## 2022-11-25 NOTE — Telephone Encounter (Signed)
Not able to leave message

## 2022-11-25 NOTE — Telephone Encounter (Signed)
Pt called in returning a call and would like a call back

## 2022-11-26 ENCOUNTER — Ambulatory Visit (INDEPENDENT_AMBULATORY_CARE_PROVIDER_SITE_OTHER): Payer: Medicare Other | Admitting: Orthopaedic Surgery

## 2022-11-26 ENCOUNTER — Encounter: Payer: Self-pay | Admitting: Orthopaedic Surgery

## 2022-11-26 ENCOUNTER — Other Ambulatory Visit (INDEPENDENT_AMBULATORY_CARE_PROVIDER_SITE_OTHER): Payer: Self-pay

## 2022-11-26 VITALS — BP 152/73 | HR 76 | Ht <= 58 in | Wt 150.0 lb

## 2022-11-26 DIAGNOSIS — M25562 Pain in left knee: Secondary | ICD-10-CM

## 2022-11-26 DIAGNOSIS — M1712 Unilateral primary osteoarthritis, left knee: Secondary | ICD-10-CM | POA: Diagnosis not present

## 2022-11-26 DIAGNOSIS — M25561 Pain in right knee: Secondary | ICD-10-CM

## 2022-11-26 MED ORDER — BUPIVACAINE HCL 0.5 % IJ SOLN
3.0000 mL | INTRAMUSCULAR | Status: AC | PRN
Start: 2022-11-26 — End: 2022-11-26
  Administered 2022-11-26: 3 mL via INTRA_ARTICULAR

## 2022-11-26 MED ORDER — METHYLPREDNISOLONE ACETATE 40 MG/ML IJ SUSP
40.0000 mg | INTRAMUSCULAR | Status: AC | PRN
Start: 2022-11-26 — End: 2022-11-26
  Administered 2022-11-26: 40 mg via INTRA_ARTICULAR

## 2022-11-26 MED ORDER — LIDOCAINE HCL 1 % IJ SOLN
0.5000 mL | INTRAMUSCULAR | Status: AC | PRN
Start: 2022-11-26 — End: 2022-11-26
  Administered 2022-11-26: .5 mL

## 2022-11-26 NOTE — Progress Notes (Signed)
Office Visit Note   Patient: Patricia Hoover           Date of Birth: Jun 16, 1945           MRN: [id number] Visit Date: 11/26/2022              Requested by: Joaquim Nam, MD [address] [place],  [place] [address] PCP: Joaquim Nam, MD   Assessment & Plan: Visit Diagnoses:  1. Acute pain of right knee   2. Unilateral primary osteoarthritis, left knee     Plan: Left knee injection performed with improvement in her pain.  We reviewed the x-rays discussed treatment option choices.  She can follow-up with me in 2 months.  Follow-Up Instructions: No follow-ups on file.   Orders:  Orders Placed This Encounter  Procedures   XR KNEE 3 VIEW LEFT   No orders of the defined types were placed in this encounter.     Procedures: Large Joint Inj: L knee on 11/26/2022 12:31 PM Indications: joint swelling and pain Details: 22 G 1.5 in needle, anterolateral approach  Arthrogram: No  Medications: 0.5 mL lidocaine 1 %; 3 mL bupivacaine 0.5 %; 40 mg methylPREDNISolone acetate 40 MG/ML Outcome: tolerated well, no immediate complications Procedure, treatment alternatives, risks and benefits explained, specific risks discussed. Consent was given by the patient. Immediately prior to procedure a time out was called to verify the correct patient, procedure, equipment, support staff and site/side marked as required. Patient was prepped and draped in the usual sterile fashion.       Clinical Data: No additional findings.   Subjective: Chief Complaint  Patient presents with   Left Knee - Pain    HPI 77 year old female seen with medial and posterior left knee pain started over 8 weeks ago she is having to use cane to walk.  States she was using machine her large crack in her knee with increased pain and difficulty walking since then.  Past history of single epidural in her back many years ago.  States she has to use her arms to get from sitting to standing she has to use a cane  with ambulation has stiffness in the morning gets better late morning and then by the end of the day increased pain with swelling in her knee.  Review of Systems all systems are updated and noncontributory to HPI.   Objective: Vital Signs: BP (!) 152/73   Pulse 76   Ht 4\' 9"  (1.448 m)   Wt 150 lb (68 kg)   BMI 32.46 kg/m   Physical Exam Constitutional:      Appearance: She is well-developed.  HENT:     Head: Normocephalic.     Right Ear: External ear normal.     Left Ear: External ear normal. There is no impacted cerumen.  Eyes:     Pupils: Pupils are equal, round, and reactive to light.  Neck:     Thyroid: No thyromegaly.     Trachea: No tracheal deviation.  Cardiovascular:     Rate and Rhythm: Normal rate.  Pulmonary:     Effort: Pulmonary effort is normal.  Abdominal:     Palpations: Abdomen is soft.  Musculoskeletal:     Cervical back: No rigidity.  Skin:    General: Skin is warm and dry.  Neurological:     Mental Status: She is alert and oriented to person, place, and time.  Psychiatric:        Behavior: Behavior normal.  Ortho Exam Patient has mild varus left knee open medial joint line with valgus stress.  Medial joint line tenderness and posterior medial tenderness.  Crepitus with knee range of motion.  She does reach full extension she is amatory with a mild left knee limp.  Negative logroll hips right and left distal pulses are 2+ no sciatic notch tenderness.  Specialty Comments:  No specialty comments available.  Imaging: XR KNEE 3 VIEW LEFT  Result Date: 11/26/2022 Standing AP both knees lateral left knee sunrise patella bilateral radiographs are reviewed.  Patient has bilateral medial joint line narrowing bone-on-bone worse on the left than right with subchondral sclerosis marginal osteophytes.  Patellofemoral degenerative changes is noted. Impression: Left knee osteoarthritis with more severe medial compartment changes mild varus.    PMFS  History: Patient Active Problem List   Diagnosis Date Noted   Unilateral primary osteoarthritis, left knee 11/26/2022   Dysuria 04/21/2022   Stiff-legged gait 04/21/2022   Scalp lesion 09/21/2021   Clavicle enlargement 04/05/2021   Hyperpigmentation 03/01/2021   Vitamin D deficiency 03/01/2021   Leg pain 05/21/2019   Right leg pain 05/21/2019   Healthcare maintenance 01/08/2018   Paresthesia 01/08/2018   Neck pain 08/31/2017   Angioedema 06/15/2017   Peptic ulcer of duodenum    Other specified disease of esophagus    Hematochezia    External hemorrhoids    Diverticulosis of large intestine without diverticulitis    Internal hemorrhoids    Duodenal ulceration    Advance care planning 12/23/2016   Renal stone 04/20/2016   Heartburn 12/17/2015   Medicare annual wellness visit, subsequent 06/05/2013   Hyperglycemia 06/05/2013   Knee pain 08/31/2012   Osteoporosis 08/18/2012   HTN (hypertension) 08/09/2012   HLD (hyperlipidemia) 08/09/2012   Past Medical History:  Diagnosis Date   Arrhythmia    possible hx, resolved prev   Blood transfusion without reported diagnosis 1972   had reaction; had to stop   Carpal tunnel syndrome    Cataract 2019   resolved with surgery   Duodenal ulcer    H/O exercise stress test 2012   normal   Heart murmur    as child   High cholesterol    History of kidney stones    Hx of colonic polyps    Hypertension    Osteoporosis 2010   t score - 3.9   Renal stones     Family History  Problem Relation Age of Onset   Heart disease Mother    Renal Disease Mother    Heart failure Mother    Colon cancer Neg Hx    Breast cancer Neg Hx     Past Surgical History:  Procedure Laterality Date   ABDOMINAL HYSTERECTOMY  1972   for endometriosis   CATARACT EXTRACTION W/ INTRAOCULAR LENS IMPLANT Right 06/2017   CATARACT EXTRACTION W/ INTRAOCULAR LENS IMPLANT Left 07/2017   COLONOSCOPY Left 03/02/2017   Procedure: COLONOSCOPY;  Surgeon: Pasty Spillers, MD;  Location: [facility] ENDOSCOPY;  Service: Endoscopy;  Laterality: Left;   COLONOSCOPY WITH PROPOFOL N/A 05/04/2016   Procedure: COLONOSCOPY WITH PROPOFOL;  Surgeon: Wyline Mood, MD;  Location: [facility] ENDOSCOPY;  Service: Endoscopy;  Laterality: N/A;   ESOPHAGOGASTRODUODENOSCOPY Left 03/02/2017   Procedure: ESOPHAGOGASTRODUODENOSCOPY (EGD);  Surgeon: Pasty Spillers, MD;  Location: [facility] ENDOSCOPY;  Service: Endoscopy;  Laterality: Left;   ESOPHAGOGASTRODUODENOSCOPY (EGD) WITH PROPOFOL N/A 04/08/2017   Procedure: ESOPHAGOGASTRODUODENOSCOPY (EGD) WITH PROPOFOL;  Surgeon: Pasty Spillers, MD;  Location: [facility] ENDOSCOPY;  Service: Endoscopy;  Laterality: N/A;   LITHOTRIPSY     for kidney stone x5   LITHOTRIPSY  09/2017   Social History   Occupational History   Occupation: Retired    Comment: Product manager  Tobacco Use   Smoking status: Former    Current packs/day: 0.00    Types: Cigarettes    Quit date: 01/19/1991    Years since quitting: 31.8   Smokeless tobacco: Never  Vaping Use   Vaping status: Never Used  Substance and Sexual Activity   Alcohol use: No    Comment: rare wine   Drug use: No   Sexual activity: Not on file

## 2022-11-29 NOTE — Telephone Encounter (Signed)
Called patient she has been seen by orthopedics already. Will call if any further questions.

## 2022-12-02 DIAGNOSIS — N200 Calculus of kidney: Secondary | ICD-10-CM | POA: Diagnosis not present

## 2023-01-07 ENCOUNTER — Encounter: Payer: Self-pay | Admitting: Family Medicine

## 2023-01-07 ENCOUNTER — Ambulatory Visit (INDEPENDENT_AMBULATORY_CARE_PROVIDER_SITE_OTHER): Payer: Medicare Other | Admitting: Family Medicine

## 2023-01-07 VITALS — BP 120/70 | HR 79 | Temp 99.6°F | Ht <= 58 in | Wt 148.1 lb

## 2023-01-07 DIAGNOSIS — R051 Acute cough: Secondary | ICD-10-CM | POA: Diagnosis not present

## 2023-01-07 LAB — POC INFLUENZA A&B (BINAX/QUICKVUE)
Influenza A, POC: NEGATIVE
Influenza B, POC: NEGATIVE

## 2023-01-07 LAB — POC COVID19 BINAXNOW: SARS Coronavirus 2 Ag: NEGATIVE

## 2023-01-07 MED ORDER — BENZONATATE 200 MG PO CAPS: 200.0000 mg | ORAL_CAPSULE | Freq: Two times a day (BID) | ORAL | 0 refills | Status: DC | PRN

## 2023-01-07 NOTE — Progress Notes (Signed)
Patient ID: Zoie Simington, female    DOB: 05-Nov-1945, 77 y.o.   MRN: [id number]  This visit was conducted in person.  BP 120/70 (BP Location: Right Arm, Patient Position: Sitting, Cuff Size: Large)   Pulse 79   Temp 99.6 F (37.6 C) (Temporal)   Ht 4\' 9"  (1.448 m)   Wt 148 lb 2 oz (67.2 kg)   SpO2 95%   BMI 32.05 kg/m    CC:  Chief Complaint  Patient presents with   Cough    With chest congestion-Started yesterday    Generalized Body Aches         Subjective:   HPI: Caylen Vicens is a 77 y.o. female presenting on 01/07/2023 for Cough (With chest congestion-Started yesterday/) and Generalized Body Aches (/)   Date of onset:  24 hours Initial symptoms included  cough, dry,   No ear pain, no sinus congestion, no face pain Symptoms progressed to increasing coughing, body ache, headache  No SOB, no ST.   Sick contacts:  daughter sick with flu COVID testing:   none     She has tried to treat with  coricidin.     No history of chronic lung disease such as asthma or COPD.  Former minimal remote smoker.      Relevant past medical, surgical, family and social history reviewed and updated as indicated. Interim medical history since our last visit reviewed. Allergies and medications reviewed and updated. Outpatient Medications Prior to Visit  Medication Sig Dispense Refill   amLODipine (NORVASC) 2.5 MG tablet Take 1 tablet (2.5 mg total) by mouth daily. 90 tablet 3   Ascorbic Acid (VITAMIN C GUMMIE PO) Take 2 each by mouth daily.     Cholecalciferol (VITAMIN D3) 50 MCG (2000 UT) capsule Take 2 capsules (4,000 Units total) by mouth daily.     indapamide (LOZOL) 2.5 MG tablet Take 1 tablet (2.5 mg total) by mouth daily.     Potassium Citrate 15 MEQ (1620 MG) TBCR Take 1 tablet by mouth in the morning and at bedtime.     simvastatin (ZOCOR) 10 MG tablet Take 1 tablet (10 mg total) by mouth every other day. 45 tablet 3   No facility-administered medications prior to visit.      Per HPI unless specifically indicated in ROS section below Review of Systems  Constitutional:  Positive for fatigue. Negative for fever.  HENT:  Negative for congestion.   Eyes:  Negative for pain.  Respiratory:  Positive for cough. Negative for shortness of breath.   Cardiovascular:  Negative for chest pain, palpitations and leg swelling.  Gastrointestinal:  Negative for abdominal pain.  Genitourinary:  Negative for dysuria and vaginal bleeding.  Musculoskeletal:  Negative for back pain.  Neurological:  Negative for syncope, light-headedness and headaches.  Psychiatric/Behavioral:  Negative for dysphoric mood.    Objective:  BP 120/70 (BP Location: Right Arm, Patient Position: Sitting, Cuff Size: Large)   Pulse 79   Temp 99.6 F (37.6 C) (Temporal)   Ht 4\' 9"  (1.448 m)   Wt 148 lb 2 oz (67.2 kg)   SpO2 95%   BMI 32.05 kg/m   Wt Readings from Last 3 Encounters:  01/07/23 148 lb 2 oz (67.2 kg)  11/26/22 150 lb (68 kg)  09/23/22 151 lb (68.5 kg)      Physical Exam Constitutional:      General: She is not in acute distress.    Appearance: She is well-developed. She is not ill-appearing  or toxic-appearing.  HENT:     Head: Normocephalic.     Right Ear: Hearing, tympanic membrane, ear canal and external ear normal. Tympanic membrane is not erythematous, retracted or bulging.     Left Ear: Hearing, tympanic membrane, ear canal and external ear normal. Tympanic membrane is not erythematous, retracted or bulging.     Nose: Mucosal edema and rhinorrhea present.     Right Sinus: No maxillary sinus tenderness or frontal sinus tenderness.     Left Sinus: No maxillary sinus tenderness or frontal sinus tenderness.     Mouth/Throat:     Pharynx: Uvula midline.  Eyes:     General: Lids are normal. Lids are everted, no foreign bodies appreciated.     Conjunctiva/sclera: Conjunctivae normal.     Pupils: Pupils are equal, round, and reactive to light.  Neck:     Thyroid: No thyroid  mass or thyromegaly.     Vascular: No carotid bruit.     Trachea: Trachea normal.  Cardiovascular:     Rate and Rhythm: Normal rate and regular rhythm.     Pulses: Normal pulses.     Heart sounds: Normal heart sounds, S1 normal and S2 normal. No murmur heard.    No friction rub. No gallop.  Pulmonary:     Effort: Pulmonary effort is normal. No tachypnea or respiratory distress.     Breath sounds: Normal breath sounds. No decreased breath sounds, wheezing, rhonchi or rales.  Musculoskeletal:     Cervical back: Normal range of motion and neck supple.  Skin:    General: Skin is warm and dry.     Findings: No rash.  Neurological:     Mental Status: She is alert.  Psychiatric:        Mood and Affect: Mood is not anxious or depressed.        Speech: Speech normal.        Behavior: Behavior normal. Behavior is cooperative.        Judgment: Judgment normal.       Results for orders placed or performed in visit on 01/07/23  POC COVID-19   Collection Time: 01/07/23 11:08 AM  Result Value Ref Range   SARS Coronavirus 2 Ag Negative Negative  POC Influenza A&B (Binax test)   Collection Time: 01/07/23 11:08 AM  Result Value Ref Range   Influenza A, POC Negative Negative   Influenza B, POC Negative Negative    Assessment and Plan  Acute cough Assessment & Plan: Acute, most likely viral upper respiratory tract infection. COVID and flu test negative in office today.  Recommended retesting day 4-5 with illness given we are fairly early on at this point. Continue Coricidin for symptoms, add benzonatate 200 mg p.o. twice daily as needed cough. Recommend rest, fluids and time. Return and ER precautions provided.  Recommend ER visit if severe shortness of breath.  Orders: -     POC COVID-19 BinaxNow -     POC Influenza A&B(BINAX/QUICKVUE)  Other orders -     Benzonatate; Take 1 capsule (200 mg total) by mouth 2 (two) times daily as needed for cough.  Dispense: 20 capsule; Refill:  0    No follow-ups on file.   Kerby Nora, MD

## 2023-01-07 NOTE — Assessment & Plan Note (Signed)
Acute, most likely viral upper respiratory tract infection. COVID and flu test negative in office today.  Recommended retesting day 4-5 with illness given we are fairly early on at this point. Continue Coricidin for symptoms, add benzonatate 200 mg p.o. twice daily as needed cough. Recommend rest, fluids and time. Return and ER precautions provided.  Recommend ER visit if severe shortness of breath.

## 2023-01-28 ENCOUNTER — Ambulatory Visit: Payer: Medicare Other | Admitting: Orthopaedic Surgery

## 2023-02-01 ENCOUNTER — Ambulatory Visit: Payer: Medicare Other | Admitting: Orthopaedic Surgery

## 2023-02-01 ENCOUNTER — Encounter: Payer: Self-pay | Admitting: Orthopaedic Surgery

## 2023-02-01 VITALS — BP 133/77 | HR 83 | Ht <= 58 in | Wt 147.0 lb

## 2023-02-01 DIAGNOSIS — M810 Age-related osteoporosis without current pathological fracture: Secondary | ICD-10-CM

## 2023-02-01 DIAGNOSIS — Z8739 Personal history of other diseases of the musculoskeletal system and connective tissue: Secondary | ICD-10-CM | POA: Diagnosis not present

## 2023-02-01 DIAGNOSIS — M1712 Unilateral primary osteoarthritis, left knee: Secondary | ICD-10-CM | POA: Diagnosis not present

## 2023-02-01 NOTE — Progress Notes (Signed)
 Office Visit Note   Patient: Patricia Hoover           Date of Birth: November 04, 1945           MRN: [id number] Visit Date: 02/01/2023              Requested by: Cleatus Arlyss RAMAN, MD [address] [place],  [place] [address] PCP: Cleatus Arlyss RAMAN, MD   Assessment & Plan: Visit Diagnoses:  1. History of osteoporosis   2. Age-related osteoporosis without current pathological fracture   3. Unilateral primary osteoarthritis, left knee     Plan: Will proceed with bone density test we discussed vitamin D  calcium  and walking program.  She may require additional treatment and she has had problems in the past with biphosphonate with GI problems.  She may be a ideal candidate for osteoporotic clinic referral.  Follow-Up Instructions: Return in about 1 month (around 03/04/2023).   Orders:  Orders Placed This Encounter  Procedures   DG BONE DENSITY (DXA)   No orders of the defined types were placed in this encounter.     Procedures: No procedures performed   Clinical Data: No additional findings.   Subjective: Chief Complaint  Patient presents with   Left Knee - Pain, Follow-up    HPI 78 year old female returns for follow-up of left knee previous injection 11/26/2022.  Patient states she was in therapy had some problems getting out of a chair.  She is doing some strengthening with abduction exercises heard a pop and had pain in her calf posterior aspect of her knee.  She has been using a cane since that time has difficulty putting all her weight on her knee.  States it is better when she is up and mobile.  Patient has history of fracture.  Last bone density 2015 which T-score was -2.9 consistent with osteoporosis.  Previous x-rays showed bilateral knee osteoarthritic changes with severe narrowing medial compartments and spurring.  Total knee arthroplasty has been discussed she is trying to delay this as long as she can.  Review of Systems all systems update unchanged from  11/26/2022.   Objective: Vital Signs: BP 133/77   Pulse 83   Ht 4' 9.5 (1.461 m)   Wt 147 lb (66.7 kg)   BMI 31.26 kg/m   Physical Exam Constitutional:      Appearance: She is well-developed.  HENT:     Head: Normocephalic.     Right Ear: External ear normal.     Left Ear: External ear normal. There is no impacted cerumen.  Eyes:     Pupils: Pupils are equal, round, and reactive to light.  Neck:     Thyroid : No thyromegaly.     Trachea: No tracheal deviation.  Cardiovascular:     Rate and Rhythm: Normal rate.  Pulmonary:     Effort: Pulmonary effort is normal.  Abdominal:     Palpations: Abdomen is soft.  Musculoskeletal:     Cervical back: No rigidity.  Skin:    General: Skin is warm and dry.  Neurological:     Mental Status: She is alert and oriented to person, place, and time.  Psychiatric:        Behavior: Behavior normal.     Ortho Exam patient is amatory with a slight left knee limp.  Some tenderness posterior medial joint line left knee.  ACL PCL is normal.  No definite palpable Baker's cyst noted distal pulses are intact negative logroll of the hip.  Specialty Comments:  No specialty comments available.  Imaging: No results found.   PMFS History: Patient Active Problem List   Diagnosis Date Noted   Acute cough 01/07/2023   Unilateral primary osteoarthritis, left knee 11/26/2022   Dysuria 04/21/2022   Stiff-legged gait 04/21/2022   Scalp lesion 09/21/2021   Clavicle enlargement 04/05/2021   Hyperpigmentation 03/01/2021   Vitamin D  deficiency 03/01/2021   Leg pain 05/21/2019   Right leg pain 05/21/2019   Healthcare maintenance 01/08/2018   Paresthesia 01/08/2018   Neck pain 08/31/2017   Angioedema 06/15/2017   Peptic ulcer of duodenum    Other specified disease of esophagus    Hematochezia    External hemorrhoids    Diverticulosis of large intestine without diverticulitis    Internal hemorrhoids    Duodenal ulceration    Advance care  planning 12/23/2016   Renal stone 04/20/2016   Heartburn 12/17/2015   Medicare annual wellness visit, subsequent 06/05/2013   Hyperglycemia 06/05/2013   Knee pain 08/31/2012   Osteoporosis 08/18/2012   HTN (hypertension) 08/09/2012   HLD (hyperlipidemia) 08/09/2012   Past Medical History:  Diagnosis Date   Arrhythmia    possible hx, resolved prev   Blood transfusion without reported diagnosis 1972   had reaction; had to stop   Carpal tunnel syndrome    Cataract 2019   resolved with surgery   Duodenal ulcer    H/O exercise stress test 2012   normal   Heart murmur    as child   High cholesterol    History of kidney stones    Hx of colonic polyps    Hypertension    Osteoporosis 2010   t score - 3.9   Renal stones     Family History  Problem Relation Age of Onset   Heart disease Mother    Renal Disease Mother    Heart failure Mother    Colon cancer Neg Hx    Breast cancer Neg Hx     Past Surgical History:  Procedure Laterality Date   ABDOMINAL HYSTERECTOMY  1972   for endometriosis   CATARACT EXTRACTION W/ INTRAOCULAR LENS IMPLANT Right 06/2017   CATARACT EXTRACTION W/ INTRAOCULAR LENS IMPLANT Left 07/2017   COLONOSCOPY Left 03/02/2017   Procedure: COLONOSCOPY;  Surgeon: Janalyn Keene NOVAK, MD;  Location: [facility] ENDOSCOPY;  Service: Endoscopy;  Laterality: Left;   COLONOSCOPY WITH PROPOFOL  N/A 05/04/2016   Procedure: COLONOSCOPY WITH PROPOFOL ;  Surgeon: Ruel Kung, MD;  Location: [facility] ENDOSCOPY;  Service: Endoscopy;  Laterality: N/A;   ESOPHAGOGASTRODUODENOSCOPY Left 03/02/2017   Procedure: ESOPHAGOGASTRODUODENOSCOPY (EGD);  Surgeon: Janalyn Keene NOVAK, MD;  Location: [facility] ENDOSCOPY;  Service: Endoscopy;  Laterality: Left;   ESOPHAGOGASTRODUODENOSCOPY (EGD) WITH PROPOFOL  N/A 04/08/2017   Procedure: ESOPHAGOGASTRODUODENOSCOPY (EGD) WITH PROPOFOL ;  Surgeon: Janalyn Keene NOVAK, MD;  Location: [facility] ENDOSCOPY;  Service: Endoscopy;  Laterality: N/A;   LITHOTRIPSY     for  kidney stone x5   LITHOTRIPSY  09/2017   Social History   Occupational History   Occupation: Retired    Comment: Product Manager  Tobacco Use   Smoking status: Former    Current packs/day: 0.00    Types: Cigarettes    Quit date: 01/19/1991    Years since quitting: 32.0   Smokeless tobacco: Never  Vaping Use   Vaping status: Never Used  Substance and Sexual Activity   Alcohol use: No    Comment: rare wine   Drug use: No   Sexual activity: Not on file

## 2023-02-08 ENCOUNTER — Ambulatory Visit (INDEPENDENT_AMBULATORY_CARE_PROVIDER_SITE_OTHER): Payer: Medicare Other | Admitting: Family Medicine

## 2023-02-08 ENCOUNTER — Encounter: Payer: Self-pay | Admitting: Family Medicine

## 2023-02-08 VITALS — BP 140/78 | HR 78 | Ht <= 58 in | Wt 151.2 lb

## 2023-02-08 DIAGNOSIS — M810 Age-related osteoporosis without current pathological fracture: Secondary | ICD-10-CM | POA: Diagnosis not present

## 2023-02-08 LAB — BASIC METABOLIC PANEL
BUN: 16 mg/dL (ref 6–23)
CO2: 32 meq/L (ref 19–32)
Calcium: 9.6 mg/dL (ref 8.4–10.5)
Chloride: 99 meq/L (ref 96–112)
Creatinine, Ser: 0.55 mg/dL (ref 0.40–1.20)
GFR: 87.85 mL/min (ref >60.00)
Glucose, Bld: 115 mg/dL — ABNORMAL HIGH (ref 70–99)
Potassium: 3.8 meq/L (ref 3.5–5.1)
Sodium: 141 meq/L (ref 135–145)

## 2023-02-08 LAB — VITAMIN D 25 HYDROXY (VIT D DEFICIENCY, FRACTURES): VITD: 44.37 ng/mL (ref 30.00–100.00)

## 2023-02-08 NOTE — Patient Instructions (Signed)
I think it makes sense to get labs done today and then get the bone density test done.    We'll go from there.   It may make sense to see a bone density doctor/clinic after the testing is done.   Take care.  Glad to see you.

## 2023-02-08 NOTE — Progress Notes (Unsigned)
D/w pt about ortho eval and recs re: repeat DXA.  She has less knee pain with activity.  More pain is prolonged inactivity.

## 2023-02-09 ENCOUNTER — Encounter: Payer: Self-pay | Admitting: Family Medicine

## 2023-02-09 LAB — PTH, INTACT AND CALCIUM
Calcium: 9.6 mg/dL (ref 8.6–10.4)
PTH: 20 pg/mL (ref 16–77)

## 2023-02-09 NOTE — Assessment & Plan Note (Signed)
I think it makes sense to get labs done today and then get the bone density test done.    We'll go from there.   It may make sense to see a bone density doctor/clinic after the testing is done. D/w pt.

## 2023-02-15 ENCOUNTER — Other Ambulatory Visit: Payer: Self-pay

## 2023-02-15 ENCOUNTER — Telehealth: Payer: Self-pay | Admitting: Family Medicine

## 2023-02-15 ENCOUNTER — Other Ambulatory Visit: Payer: Self-pay | Admitting: Family Medicine

## 2023-02-15 ENCOUNTER — Telehealth: Payer: Self-pay | Admitting: Radiology

## 2023-02-15 DIAGNOSIS — M810 Age-related osteoporosis without current pathological fracture: Secondary | ICD-10-CM

## 2023-02-15 DIAGNOSIS — Z1231 Encounter for screening mammogram for malignant neoplasm of breast: Secondary | ICD-10-CM

## 2023-02-15 NOTE — Telephone Encounter (Signed)
Returned call to patient. I explained to the patient that Dr. Ophelia Charter had placed the order for bone density and Dr. Para March was wanting her to have it done. But she told Dr. Ophelia Charter that we will just order it. Please place order for bone density test.

## 2023-02-15 NOTE — Telephone Encounter (Signed)
Copied from CRM [id number]. Topic: Appointments - Scheduling Inquiry for Clinic >> Feb 15, 2023  9:21 AM Leavy Cella D wrote: Reason for CRM: Patient is needing to be scheduled for bone density test . Please call patient to schedule .

## 2023-02-15 NOTE — Telephone Encounter (Signed)
Patient called office unsure if Dr. Ophelia Charter was ordering her bone density test of if it needed to be done by Dr. Para March, PCP.  I advised the order had been entered for [place] at [facility] which is where the patient had prior bone density done in 2015.  She states that she has already spoken with her PCP's office and they are working to get her scheduled at [facility]. Order from Dr. Ophelia Charter can be disregarded.  Sabrina-did not know if you would need to cancel this out?

## 2023-02-16 NOTE — Addendum Note (Signed)
Addended by: Joaquim Nam on: 02/16/2023 02:10 PM   Modules accepted: Orders

## 2023-02-16 NOTE — Telephone Encounter (Signed)
Ordered, she'll need to call and schedule.  Thanks.

## 2023-02-20 ENCOUNTER — Other Ambulatory Visit: Payer: Self-pay | Admitting: Family Medicine

## 2023-02-20 DIAGNOSIS — I10 Essential (primary) hypertension: Secondary | ICD-10-CM

## 2023-02-25 ENCOUNTER — Telehealth: Payer: Self-pay | Admitting: Family Medicine

## 2023-02-25 ENCOUNTER — Other Ambulatory Visit (INDEPENDENT_AMBULATORY_CARE_PROVIDER_SITE_OTHER): Payer: Medicare Other

## 2023-02-25 DIAGNOSIS — I10 Essential (primary) hypertension: Secondary | ICD-10-CM | POA: Diagnosis not present

## 2023-02-25 LAB — CBC WITH DIFFERENTIAL/PLATELET
Basophils Absolute: 0.1 10*3/uL (ref 0.0–0.1)
Basophils Relative: 1.1 % (ref 0.0–3.0)
Eosinophils Absolute: 0.1 10*3/uL (ref 0.0–0.7)
Eosinophils Relative: 0.7 % (ref 0.0–5.0)
HCT: 40.7 % (ref 36.0–46.0)
Hemoglobin: 13.6 g/dL (ref 12.0–15.0)
Lymphocytes Relative: 21.1 % (ref 12.0–46.0)
Lymphs Abs: 1.7 10*3/uL (ref 0.7–4.0)
MCHC: 33.4 g/dL (ref 30.0–36.0)
MCV: 88.1 fL (ref 78.0–100.0)
Monocytes Absolute: 0.8 10*3/uL (ref 0.1–1.0)
Monocytes Relative: 9.7 % (ref 3.0–12.0)
Neutro Abs: 5.3 10*3/uL (ref 1.4–7.7)
Neutrophils Relative %: 67.4 % (ref 43.0–77.0)
Platelets: 262 10*3/uL (ref 150.0–400.0)
RBC: 4.62 Mil/uL (ref 3.87–5.11)
RDW: 13.8 % (ref 11.5–15.5)
WBC: 7.9 10*3/uL (ref 4.0–10.5)

## 2023-02-25 LAB — HEPATIC FUNCTION PANEL
ALT: 9 U/L (ref 0–35)
AST: 12 U/L (ref 0–37)
Albumin: 4.1 g/dL (ref 3.5–5.2)
Alkaline Phosphatase: 72 U/L (ref 39–117)
Bilirubin, Direct: 0.1 mg/dL (ref 0.0–0.3)
Total Bilirubin: 0.5 mg/dL (ref 0.2–1.2)
Total Protein: 6.6 g/dL (ref 6.0–8.3)

## 2023-02-25 LAB — LIPID PANEL
Cholesterol: 173 mg/dL (ref 0–200)
HDL: 60.9 mg/dL (ref >39.00)
LDL Cholesterol: 87 mg/dL (ref 0–99)
NonHDL: 112.5
Total CHOL/HDL Ratio: 3
Triglycerides: 126 mg/dL (ref 0.0–149.0)
VLDL: 25.2 mg/dL (ref 0.0–40.0)

## 2023-02-25 NOTE — Telephone Encounter (Signed)
 Copied from CRM [id number]. Topic: General - Other >> Feb 25, 2023  7:41 AM Turkey A wrote: Reason for CRM: Patient was returning a call from "Ada"- she said she would speak to her at the appointment this morning

## 2023-02-25 NOTE — Telephone Encounter (Signed)
 Patient is scheduled to be seen 2/14

## 2023-03-04 ENCOUNTER — Encounter: Payer: Self-pay | Admitting: Family Medicine

## 2023-03-04 ENCOUNTER — Ambulatory Visit (INDEPENDENT_AMBULATORY_CARE_PROVIDER_SITE_OTHER): Payer: Medicare Other | Admitting: Family Medicine

## 2023-03-04 VITALS — BP 132/64 | HR 80 | Temp 98.7°F | Ht <= 58 in | Wt 150.0 lb

## 2023-03-04 DIAGNOSIS — E785 Hyperlipidemia, unspecified: Secondary | ICD-10-CM | POA: Diagnosis not present

## 2023-03-04 DIAGNOSIS — N200 Calculus of kidney: Secondary | ICD-10-CM | POA: Diagnosis not present

## 2023-03-04 DIAGNOSIS — Z0000 Encounter for general adult medical examination without abnormal findings: Secondary | ICD-10-CM

## 2023-03-04 DIAGNOSIS — Z7189 Other specified counseling: Secondary | ICD-10-CM

## 2023-03-04 DIAGNOSIS — G8929 Other chronic pain: Secondary | ICD-10-CM

## 2023-03-04 DIAGNOSIS — I10 Essential (primary) hypertension: Secondary | ICD-10-CM | POA: Diagnosis not present

## 2023-03-04 MED ORDER — POTASSIUM CITRATE ER 15 MEQ (1620 MG) PO TBCR: 1.0000 | EXTENDED_RELEASE_TABLET | Freq: Two times a day (BID) | ORAL | 3 refills | Status: DC

## 2023-03-04 MED ORDER — SIMVASTATIN 10 MG PO TABS: 10.0000 mg | ORAL_TABLET | ORAL | 3 refills | Status: DC

## 2023-03-04 MED ORDER — AMLODIPINE BESYLATE 2.5 MG PO TABS: 2.5000 mg | ORAL_TABLET | Freq: Every day | ORAL | 3 refills | Status: DC

## 2023-03-04 MED ORDER — INDAPAMIDE 2.5 MG PO TABS: 2.5000 mg | ORAL_TABLET | Freq: Every day | ORAL | 3 refills | Status: DC

## 2023-03-04 NOTE — Progress Notes (Signed)
DXA prev ordered.  She can call to schedule, d/w pt.  Flu 10/2022 PNA up to date.  Tetanus 2013 Shingles d/w pt.  Covid prev done.  RSV vaccine d/w pt.   Colonoscopy 2019 Mammogram 2024 Advance directive dw pt. Oswaldo Milian designated if patient were incapacitated.   She has seen ortho about her L knee.  Pain with standing, right after standing.  She is using a cane as needed.    Hypertension:    Using medication without problems or lightheadedness: yes Chest pain with exertion:no Edema:no Short of breath:no Labs d/w pt.    On indapamide and potassium and no recent stone sx or stone passed, d/w pt.    Elevated Cholesterol: Using medications without problems:yes Muscle aches: no Diet compliance: d/w pt.  Exercise: d/w pt.   Meds, vitals, and allergies reviewed.   ROS: Per HPI unless specifically indicated in ROS section   GEN: nad, alert and oriented HEENT: mucous membranes moist NECK: supple w/o LA CV: rrr.   PULM: ctab, no inc wob ABD: soft, +bs EXT: no edema SKIN: Well-perfused.

## 2023-03-04 NOTE — Patient Instructions (Addendum)
Please call about follow up with GI and about scheduling your bone density test.   Take care.  Glad to see you.  Check with the pharmacy about shingles, tetanus and RSV vaccine. I would do one at a time.

## 2023-03-06 ENCOUNTER — Emergency Department: Payer: Medicare Other

## 2023-03-06 ENCOUNTER — Emergency Department
Admission: EM | Admit: 2023-03-06 | Discharge: 2023-03-06 | Disposition: A | Discharge status: Home / Self Care | Payer: Medicare Other | Attending: Emergency Medicine | Admitting: Emergency Medicine

## 2023-03-06 ENCOUNTER — Other Ambulatory Visit: Payer: Self-pay

## 2023-03-06 DIAGNOSIS — D1803 Hemangioma of intra-abdominal structures: Secondary | ICD-10-CM | POA: Diagnosis not present

## 2023-03-06 DIAGNOSIS — R935 Abnormal findings on diagnostic imaging of other abdominal regions, including retroperitoneum: Secondary | ICD-10-CM | POA: Diagnosis not present

## 2023-03-06 DIAGNOSIS — K5792 Diverticulitis of intestine, part unspecified, without perforation or abscess without bleeding: Secondary | ICD-10-CM | POA: Diagnosis not present

## 2023-03-06 DIAGNOSIS — K625 Hemorrhage of anus and rectum: Secondary | ICD-10-CM | POA: Diagnosis not present

## 2023-03-06 DIAGNOSIS — I10 Essential (primary) hypertension: Secondary | ICD-10-CM | POA: Diagnosis not present

## 2023-03-06 DIAGNOSIS — N200 Calculus of kidney: Secondary | ICD-10-CM | POA: Diagnosis not present

## 2023-03-06 DIAGNOSIS — K5730 Diverticulosis of large intestine without perforation or abscess without bleeding: Secondary | ICD-10-CM | POA: Diagnosis not present

## 2023-03-06 LAB — URINALYSIS, ROUTINE W REFLEX MICROSCOPIC
Bacteria, UA: NONE SEEN
Bilirubin Urine: NEGATIVE
Glucose, UA: NEGATIVE mg/dL
Hgb urine dipstick: NEGATIVE
Ketones, ur: NEGATIVE mg/dL
Nitrite: NEGATIVE
Protein, ur: NEGATIVE mg/dL
Specific Gravity, Urine: 1.027 (ref 1.005–1.030)
pH: 5 (ref 5.0–8.0)

## 2023-03-06 LAB — BASIC METABOLIC PANEL
Anion gap: 9 (ref 5–15)
BUN: 16 mg/dL (ref 8–23)
CO2: 28 mmol/L (ref 22–32)
Calcium: 9.3 mg/dL (ref 8.9–10.3)
Chloride: 103 mmol/L (ref 98–111)
Creatinine, Ser: 0.57 mg/dL (ref 0.44–1.00)
GFR, Estimated: >60 mL/min (ref >60)
Glucose, Bld: 140 mg/dL — ABNORMAL HIGH (ref 70–99)
Potassium: 3.5 mmol/L (ref 3.5–5.1)
Sodium: 140 mmol/L (ref 135–145)

## 2023-03-06 LAB — CBC
HCT: 38.1 % (ref 36.0–46.0)
Hemoglobin: 13.1 g/dL (ref 12.0–15.0)
MCH: 29.4 pg (ref 26.0–34.0)
MCHC: 34.4 g/dL (ref 30.0–36.0)
MCV: 85.6 fL (ref 80.0–100.0)
Platelets: 310 10*3/uL (ref 150–400)
RBC: 4.45 MIL/uL (ref 3.87–5.11)
RDW: 13.1 % (ref 11.5–15.5)
WBC: 9.2 10*3/uL (ref 4.0–10.5)
nRBC: 0 % (ref 0.0–0.2)

## 2023-03-06 MED ORDER — IOHEXOL 300 MG/ML  SOLN
100.0000 mL | Freq: Once | INTRAMUSCULAR | Status: AC | PRN
  Administered 2023-03-06: 80 mL via INTRAVENOUS

## 2023-03-06 MED ORDER — AMOXICILLIN-POT CLAVULANATE 875-125 MG PO TABS: 1.0000 | ORAL_TABLET | Freq: Two times a day (BID) | ORAL | 0 refills | Status: AC

## 2023-03-06 NOTE — Assessment & Plan Note (Signed)
Discussed using the cane and she can follow-up with orthopedics as needed.

## 2023-03-06 NOTE — Assessment & Plan Note (Signed)
DXA prev ordered.  She can call to schedule, d/w pt.  Flu 10/2022 PNA up to date.  Tetanus 2013 Shingles d/w pt.  Covid prev done.  RSV vaccine d/w pt.   Colonoscopy 2019 Mammogram 2024 Advance directive dw pt. Oswaldo Milian designated if patient were incapacitated.

## 2023-03-06 NOTE — Assessment & Plan Note (Signed)
Continue amlodipine indapamide potassium.  Labs discussed with patient.

## 2023-03-06 NOTE — Assessment & Plan Note (Signed)
Advance directive dw pt. Patricia Hoover designated if patient were incapacitated.  °

## 2023-03-06 NOTE — Discharge Instructions (Signed)
I have sent antibiotics to your pharmacy to help treat your diverticulitis.  I suspect the bleeding is coming from this and possibly an internal hemorrhoid as well.  This should hopefully stop within the next couple of days.  Please follow-up with the GI team as needed to ensure resolution.  Please return for any severe worsening symptoms or if you start noticing black stools.

## 2023-03-06 NOTE — Assessment & Plan Note (Signed)
Continue simvastatin.  Labs discussed with patient. 

## 2023-03-06 NOTE — ED Provider Notes (Signed)
[facility] Provider Note    Event Date/Time   First MD Initiated Contact with Patient 03/06/23 1142     (approximate)   History   Rectal Bleeding   HPI Patricia Hoover is a 78 y.o. female with history of HTN, hemorrhoids presenting today for rectal bleeding.  Patient states yesterday she noticed slight pink blood when wiping after bowel movement.  She states since then she has had 3 bowel movements with blood mixed in.  She denies any melanotic stools.  Intermittent lower abdominal pain in the left lower quadrant.  Otherwise denies fever, chills, nausea, vomiting.  Notes prior history of hemorrhoids but no history of diverticular bleeds.  Otherwise feeling well at this time.  Chart review: Patient was seen in 2021 in the ED for similar bleeding symptoms.  Eventually had GI follow-up and was diagnosed with hemorrhoids.  No symptoms since that time.     Physical Exam   Triage Vital Signs: ED Triage Vitals  Encounter Vitals Group     BP 03/06/23 0944 (!) 147/83     Systolic BP Percentile --      Diastolic BP Percentile --      Pulse Rate 03/06/23 0944 85     Resp 03/06/23 0944 18     Temp 03/06/23 0944 98 F (36.7 C)     Temp src --      SpO2 03/06/23 0944 100 %     Weight 03/06/23 0942 150 lb (68 kg)     Height 03/06/23 0942 4\' 9"  (1.448 m)     Head Circumference --      Peak Flow --      Pain Score 03/06/23 0942 0     Pain Loc --      Pain Education --      Exclude from Growth Chart --     Most recent vital signs: Vitals:   03/06/23 0944  BP: (!) 147/83  Pulse: 85  Resp: 18  Temp: 98 F (36.7 C)  SpO2: 100%   Physical Exam: I have reviewed the vital signs and nursing notes. General: Awake, alert, no acute distress.  Nontoxic appearing. Head:  Atraumatic, normocephalic.   ENT:  EOM intact, PERRL. Oral mucosa is pink and moist with no lesions. Neck: Neck is supple with full range of motion, No meningeal signs. Cardiovascular:  RRR, No  murmurs. Peripheral pulses palpable and equal bilaterally. Respiratory:  Symmetrical chest wall expansion.  No rhonchi, rales, or wheezes.  Good air movement throughout.  No use of accessory muscles.   Musculoskeletal:  No cyanosis or edema. Moving extremities with full ROM Abdomen:  Soft, mild tenderness palpation left lower quadrant, nondistended. Neuro:  GCS 15, moving all four extremities, interacting appropriately. Speech clear. Psych:  Calm, appropriate.   Skin:  Warm, dry, no rash.    ED Results / Procedures / Treatments   Labs (all labs ordered are listed, but only abnormal results are displayed) Labs Reviewed  BASIC METABOLIC PANEL - Abnormal; Notable for the following components:      Result Value   Glucose, Bld 140 (*)    All other components within normal limits  URINALYSIS, ROUTINE W REFLEX MICROSCOPIC - Abnormal; Notable for the following components:   Color, Urine YELLOW (*)    APPearance HAZY (*)    Leukocytes,Ua SMALL (*)    All other components within normal limits  CBC     EKG    RADIOLOGY Independently interpreted CT showing evidence  of possible diverticulitis   PROCEDURES:  Critical Care performed: No  Procedures   MEDICATIONS ORDERED IN ED: Medications  iohexol (OMNIPAQUE) 300 MG/ML solution 100 mL (80 mLs Intravenous Contrast Given 03/06/23 1253)     IMPRESSION / MDM / ASSESSMENT AND PLAN / ED COURSE  I reviewed the triage vital signs and the nursing notes.                              Differential diagnosis includes, but is not limited to, internal hemorrhoid, external hemorrhoid, diverticular bleed, colitis, low suspicion for upper GI bleed  Patient's presentation is most consistent with acute complicated illness / injury requiring diagnostic workup.  Patient is a 78 year old female presenting today for lower abdominal pain associated with rectal bleeding.  Exam shows slight tenderness to palpation in the left lower quadrant with some  concern for possible diverticulitis so we will get a CT scan for further evaluation.  Laboratory workup otherwise shows stable hemoglobin and normal BMP. BUN normal today as well with a low suspicion for upper GI bleed.  CT with questionable evidence for diverticulitis but no other abscess or perforation.  Suspect a combination of diverticulitis and potential internal hemorrhoid as a source of her symptoms today.  She was reassessed and otherwise feeling completely comfortable at this time.  No incidence of rectal bleeding while in the ED.  I do feel she is safe for discharge at this time with treatment of her diverticulitis and monitoring at home of her bleeding symptoms.  She is going to follow-up with the GI team outpatient.  She was given strict return precautions and agreeable with plan.     FINAL CLINICAL IMPRESSION(S) / ED DIAGNOSES   Final diagnoses:  Diverticulitis  Rectal bleeding     Rx / DC Orders   ED Discharge Orders          Ordered    Ambulatory referral to Gastroenterology       Comments: Rectal bleeding   03/06/23 1409    amoxicillin-clavulanate (AUGMENTIN) 875-125 MG tablet  2 times daily        03/06/23 1409             Note:  This document was prepared using Dragon voice recognition software and may include unintentional dictation errors.   Janith Lima, MD 03/06/23 [phone]

## 2023-03-06 NOTE — ED Triage Notes (Signed)
Pt comes with bloody stool. Pt states no thinners. Pt states yesterday she went to bathroom and little pink on tissue. Pt got up today and used bathroom and had BM with blood present in stool. Pt states it was bright red.

## 2023-03-06 NOTE — Assessment & Plan Note (Signed)
History of.  On indapamide and potassium and no recent stone sx or stone passed, d/w pt. would continue as is.

## 2023-03-08 ENCOUNTER — Ambulatory Visit (INDEPENDENT_AMBULATORY_CARE_PROVIDER_SITE_OTHER): Payer: Medicare Other | Admitting: Orthopaedic Surgery

## 2023-03-08 ENCOUNTER — Encounter: Payer: Self-pay | Admitting: Orthopaedic Surgery

## 2023-03-08 VITALS — BP 117/73 | HR 96 | Ht <= 58 in | Wt 150.0 lb

## 2023-03-08 DIAGNOSIS — Z79899 Other long term (current) drug therapy: Secondary | ICD-10-CM | POA: Insufficient documentation

## 2023-03-08 DIAGNOSIS — E785 Hyperlipidemia, unspecified: Secondary | ICD-10-CM | POA: Insufficient documentation

## 2023-03-08 DIAGNOSIS — D62 Acute posthemorrhagic anemia: Secondary | ICD-10-CM | POA: Diagnosis not present

## 2023-03-08 DIAGNOSIS — I10 Essential (primary) hypertension: Secondary | ICD-10-CM | POA: Diagnosis not present

## 2023-03-08 DIAGNOSIS — M1712 Unilateral primary osteoarthritis, left knee: Secondary | ICD-10-CM

## 2023-03-08 DIAGNOSIS — E876 Hypokalemia: Secondary | ICD-10-CM | POA: Diagnosis not present

## 2023-03-08 DIAGNOSIS — D1803 Hemangioma of intra-abdominal structures: Secondary | ICD-10-CM | POA: Diagnosis not present

## 2023-03-08 DIAGNOSIS — Z8739 Personal history of other diseases of the musculoskeletal system and connective tissue: Secondary | ICD-10-CM | POA: Diagnosis not present

## 2023-03-08 DIAGNOSIS — K5730 Diverticulosis of large intestine without perforation or abscess without bleeding: Secondary | ICD-10-CM | POA: Diagnosis not present

## 2023-03-08 DIAGNOSIS — Z87891 Personal history of nicotine dependence: Secondary | ICD-10-CM | POA: Insufficient documentation

## 2023-03-08 DIAGNOSIS — R739 Hyperglycemia, unspecified: Secondary | ICD-10-CM | POA: Insufficient documentation

## 2023-03-08 DIAGNOSIS — M6281 Muscle weakness (generalized): Secondary | ICD-10-CM

## 2023-03-08 DIAGNOSIS — M810 Age-related osteoporosis without current pathological fracture: Secondary | ICD-10-CM | POA: Diagnosis not present

## 2023-03-08 DIAGNOSIS — K922 Gastrointestinal hemorrhage, unspecified: Principal | ICD-10-CM | POA: Insufficient documentation

## 2023-03-08 DIAGNOSIS — K625 Hemorrhage of anus and rectum: Secondary | ICD-10-CM | POA: Diagnosis present

## 2023-03-08 DIAGNOSIS — N281 Cyst of kidney, acquired: Secondary | ICD-10-CM | POA: Diagnosis not present

## 2023-03-08 DIAGNOSIS — D649 Anemia, unspecified: Secondary | ICD-10-CM | POA: Diagnosis not present

## 2023-03-08 DIAGNOSIS — I700 Atherosclerosis of aorta: Secondary | ICD-10-CM | POA: Diagnosis not present

## 2023-03-09 ENCOUNTER — Observation Stay (HOSPITAL_COMMUNITY)
Admission: EM | Admit: 2023-03-09 | Discharge: 2023-03-10 | Disposition: A | Discharge status: Home / Self Care | Payer: Medicare Other | Attending: Student in an Organized Health Care Education/Training Program | Admitting: Student in an Organized Health Care Education/Training Program

## 2023-03-09 ENCOUNTER — Emergency Department (HOSPITAL_COMMUNITY): Payer: Medicare Other

## 2023-03-09 ENCOUNTER — Encounter (HOSPITAL_COMMUNITY): Payer: Self-pay | Admitting: Emergency Medicine

## 2023-03-09 ENCOUNTER — Other Ambulatory Visit: Payer: Self-pay

## 2023-03-09 DIAGNOSIS — I700 Atherosclerosis of aorta: Secondary | ICD-10-CM | POA: Diagnosis present

## 2023-03-09 DIAGNOSIS — E785 Hyperlipidemia, unspecified: Secondary | ICD-10-CM | POA: Diagnosis present

## 2023-03-09 DIAGNOSIS — N281 Cyst of kidney, acquired: Secondary | ICD-10-CM | POA: Diagnosis not present

## 2023-03-09 DIAGNOSIS — K625 Hemorrhage of anus and rectum: Secondary | ICD-10-CM

## 2023-03-09 DIAGNOSIS — R739 Hyperglycemia, unspecified: Secondary | ICD-10-CM | POA: Diagnosis present

## 2023-03-09 DIAGNOSIS — K5730 Diverticulosis of large intestine without perforation or abscess without bleeding: Secondary | ICD-10-CM | POA: Diagnosis not present

## 2023-03-09 DIAGNOSIS — K922 Gastrointestinal hemorrhage, unspecified: Principal | ICD-10-CM | POA: Diagnosis present

## 2023-03-09 DIAGNOSIS — K921 Melena: Secondary | ICD-10-CM | POA: Diagnosis not present

## 2023-03-09 DIAGNOSIS — E876 Hypokalemia: Secondary | ICD-10-CM | POA: Diagnosis present

## 2023-03-09 DIAGNOSIS — I10 Essential (primary) hypertension: Secondary | ICD-10-CM | POA: Diagnosis present

## 2023-03-09 DIAGNOSIS — D62 Acute posthemorrhagic anemia: Secondary | ICD-10-CM | POA: Diagnosis present

## 2023-03-09 DIAGNOSIS — D649 Anemia, unspecified: Secondary | ICD-10-CM | POA: Diagnosis not present

## 2023-03-09 DIAGNOSIS — D1803 Hemangioma of intra-abdominal structures: Secondary | ICD-10-CM | POA: Diagnosis not present

## 2023-03-09 HISTORY — DX: Acute duodenal ulcer without hemorrhage or perforation: K26.3

## 2023-03-09 LAB — URINALYSIS, ROUTINE W REFLEX MICROSCOPIC
Bilirubin Urine: NEGATIVE
Glucose, UA: NEGATIVE mg/dL
Hgb urine dipstick: NEGATIVE
Ketones, ur: NEGATIVE mg/dL
Leukocytes,Ua: NEGATIVE
Nitrite: NEGATIVE
Protein, ur: NEGATIVE mg/dL
Specific Gravity, Urine: 1.026 (ref 1.005–1.030)
pH: 5 (ref 5.0–8.0)

## 2023-03-09 LAB — CBC
HCT: 27.9 % — ABNORMAL LOW (ref 36.0–46.0)
Hemoglobin: 9.6 g/dL — ABNORMAL LOW (ref 12.0–15.0)
MCH: 30.1 pg (ref 26.0–34.0)
MCHC: 34.4 g/dL (ref 30.0–36.0)
MCV: 87.5 fL (ref 80.0–100.0)
Platelets: 244 10*3/uL (ref 150–400)
RBC: 3.19 MIL/uL — ABNORMAL LOW (ref 3.87–5.11)
RDW: 13.4 % (ref 11.5–15.5)
WBC: 9.7 10*3/uL (ref 4.0–10.5)
nRBC: 0 % (ref 0.0–0.2)

## 2023-03-09 LAB — COMPREHENSIVE METABOLIC PANEL
ALT: 12 U/L (ref 0–44)
AST: 14 U/L — ABNORMAL LOW (ref 15–41)
Albumin: 3.6 g/dL (ref 3.5–5.0)
Alkaline Phosphatase: 62 U/L (ref 38–126)
Anion gap: 7 (ref 5–15)
BUN: 21 mg/dL (ref 8–23)
CO2: 32 mmol/L (ref 22–32)
Calcium: 8.8 mg/dL — ABNORMAL LOW (ref 8.9–10.3)
Chloride: 101 mmol/L (ref 98–111)
Creatinine, Ser: 0.58 mg/dL (ref 0.44–1.00)
GFR, Estimated: >60 mL/min (ref >60)
Glucose, Bld: 149 mg/dL — ABNORMAL HIGH (ref 70–99)
Potassium: 2.7 mmol/L — CL (ref 3.5–5.1)
Sodium: 140 mmol/L (ref 135–145)
Total Bilirubin: 0.3 mg/dL (ref 0.0–1.2)
Total Protein: 6.8 g/dL (ref 6.5–8.1)

## 2023-03-09 LAB — HEMOGLOBIN AND HEMATOCRIT, BLOOD
HCT: 25.9 % — ABNORMAL LOW (ref 36.0–46.0)
HCT: 29.8 % — ABNORMAL LOW (ref 36.0–46.0)
HCT: 30.7 % — ABNORMAL LOW (ref 36.0–46.0)
Hemoglobin: 8.8 g/dL — ABNORMAL LOW (ref 12.0–15.0)
Hemoglobin: 9.9 g/dL — ABNORMAL LOW (ref 12.0–15.0)
Hemoglobin: 10.4 g/dL — ABNORMAL LOW (ref 12.0–15.0)

## 2023-03-09 LAB — POC OCCULT BLOOD, ED: Fecal Occult Bld: POSITIVE — AB

## 2023-03-09 LAB — PHOSPHORUS: Phosphorus: 3.3 mg/dL (ref 2.5–4.6)

## 2023-03-09 LAB — TYPE AND SCREEN
ABO/RH(D): O POS
Antibody Screen: NEGATIVE

## 2023-03-09 LAB — MAGNESIUM: Magnesium: 2.3 mg/dL (ref 1.7–2.4)

## 2023-03-09 MED ORDER — POTASSIUM CHLORIDE 10 MEQ/100ML IV SOLN
10.0000 meq | Freq: Once | INTRAVENOUS | Status: AC
  Administered 2023-03-09: 10 meq via INTRAVENOUS
  Filled 2023-03-09: qty 100

## 2023-03-09 MED ORDER — ACETAMINOPHEN 325 MG PO TABS: 650.0000 mg | ORAL_TABLET | Freq: Four times a day (QID) | ORAL | Status: DC | PRN

## 2023-03-09 MED ORDER — SIMVASTATIN 20 MG PO TABS
10.0000 mg | ORAL_TABLET | ORAL | Status: DC
  Administered 2023-03-10: 10 mg via ORAL
  Filled 2023-03-09: qty 1

## 2023-03-09 MED ORDER — IOHEXOL 350 MG/ML SOLN
100.0000 mL | Freq: Once | INTRAVENOUS | Status: AC | PRN
  Administered 2023-03-09: 100 mL via INTRAVENOUS

## 2023-03-09 MED ORDER — POTASSIUM CHLORIDE CRYS ER 20 MEQ PO TBCR
40.0000 meq | EXTENDED_RELEASE_TABLET | Freq: Once | ORAL | Status: AC
  Administered 2023-03-09: 40 meq via ORAL
  Filled 2023-03-09: qty 2

## 2023-03-09 MED ORDER — ONDANSETRON HCL 4 MG PO TABS: 4.0000 mg | ORAL_TABLET | Freq: Four times a day (QID) | ORAL | Status: DC | PRN

## 2023-03-09 MED ORDER — POTASSIUM CHLORIDE CRYS ER 20 MEQ PO TBCR
40.0000 meq | EXTENDED_RELEASE_TABLET | Freq: Once | ORAL | Status: AC
  Administered 2023-03-09: 40 meq via ORAL
  Filled 2023-03-09 (×2): qty 2

## 2023-03-09 MED ORDER — PANTOPRAZOLE SODIUM 40 MG IV SOLR: 40.0000 mg | Freq: Once | INTRAVENOUS | Status: DC

## 2023-03-09 MED ORDER — SODIUM CHLORIDE 0.9 % IV BOLUS
1000.0000 mL | Freq: Once | INTRAVENOUS | Status: AC
  Administered 2023-03-09: 1000 mL via INTRAVENOUS

## 2023-03-09 MED ORDER — ONDANSETRON HCL 4 MG/2ML IJ SOLN: 4.0000 mg | Freq: Four times a day (QID) | INTRAMUSCULAR | Status: DC | PRN

## 2023-03-09 MED ORDER — AMOXICILLIN-POT CLAVULANATE 875-125 MG PO TABS
1.0000 | ORAL_TABLET | Freq: Two times a day (BID) | ORAL | Status: DC
  Administered 2023-03-09 – 2023-03-10 (×3): 1 via ORAL
  Filled 2023-03-09 (×3): qty 1

## 2023-03-09 MED ORDER — SODIUM CHLORIDE (PF) 0.9 % IJ SOLN
INTRAMUSCULAR | Status: AC
  Filled 2023-03-09: qty 50

## 2023-03-09 MED ORDER — PANTOPRAZOLE SODIUM 40 MG IV SOLR
40.0000 mg | Freq: Once | INTRAVENOUS | Status: AC
  Administered 2023-03-09: 40 mg via INTRAVENOUS
  Filled 2023-03-09: qty 10

## 2023-03-09 MED ORDER — ACETAMINOPHEN 650 MG RE SUPP: 650.0000 mg | Freq: Four times a day (QID) | RECTAL | Status: DC | PRN

## 2023-03-09 NOTE — ED Notes (Signed)
 Pt transport to CT

## 2023-03-09 NOTE — Progress Notes (Signed)
Office Visit Note   Patient: Patricia Hoover           Date of Birth: 09/06/1945           MRN: [id number] Visit Date: 03/08/2023              Requested by: Joaquim Nam, MD [address] [place],  [place] [address] PCP: Joaquim Nam, MD   Assessment & Plan: Visit Diagnoses:  1. Age-related osteoporosis without current pathological fracture   2. Unilateral primary osteoarthritis, left knee   3. History of osteoporosis   4. Quadriceps weakness     Plan: Patient did have some therapy 1 or 2 visits and was doing abductor machine may have pulled a groin muscle or hip pain medially on the knee and quit therapy.  She needs specific quad therapy for left quad strengthening.  Recheck 5 weeks.  Follow-Up Instructions: Return in about 5 years (around 03/07/2028).   Orders:  Orders Placed This Encounter  Procedures   Amb Referral to Osteoporosis Management    Ambulatory referral to Physical Therapy   No orders of the defined types were placed in this encounter.     Procedures: No procedures performed   Clinical Data: No additional findings.   Subjective: Chief Complaint  Patient presents with   Left Knee - Follow-up    HPI 78 year old female with osteoporosis and left knee osteoarthritis primarily medial compartment has been walking with a straight knee and with hips flexed for several years.  She has some back pain and has anterolisthesis at L4-5.  Previous bone density showed osteoporosis she has a repeat ordered 10/27/2023.  She has stiffness in her knee if she sits too long.  She has known vitamin D deficiency.  Previously took Fosamax she is not sure if she took it the way she was supposed to but did have GI problems and this was several years ago.  She does not recall having to take it with 8 to 10 ounces of water, sitting up not taking with other medications or other food etc.  Patient cannot walk down stairs or hill without grabbing the rail has to pull himself  up if she goes up steps and goes up right foot first 1 step at a time.  Review of Systems past history of GERD duodenal peptic ulcer, hyperglycemia, hypertension.  Left knee osteoarthritis primarily medial compartment.   Objective: Vital Signs: BP 117/73   Pulse 96   Ht 4\' 9"  (1.448 m)   Wt 150 lb (68 kg)   BMI 32.46 kg/m   Physical Exam Constitutional:      Appearance: She is well-developed.  HENT:     Head: Normocephalic.     Right Ear: External ear normal.     Left Ear: External ear normal. There is no impacted cerumen.  Eyes:     Pupils: Pupils are equal, round, and reactive to light.  Neck:     Thyroid: No thyromegaly.     Trachea: No tracheal deviation.  Cardiovascular:     Rate and Rhythm: Normal rate.  Pulmonary:     Effort: Pulmonary effort is normal.  Abdominal:     Palpations: Abdomen is soft.  Musculoskeletal:     Cervical back: No rigidity.  Skin:    General: Skin is warm and dry.  Neurological:     Mental Status: She is alert and oriented to person, place, and time.  Psychiatric:  Behavior: Behavior normal.     Ortho Exam patient is left weak quad gait slight hyperextension of the knee forward flexion at the hips and giving way quad weakness with to finger resistive testing.  Opposite right quad shows slight weakness.  Specialty Comments:  No specialty comments available.  Imaging: CT ANGIO GI BLEED Result Date: 03/09/2023 CLINICAL DATA:  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 EXAM: CTA ABDOMEN AND PELVIS WITHOUT AND WITH CONTRAST TECHNIQUE: Multidetector CT imaging of the abdomen and pelvis was performed using the standard protocol during bolus administration of intravenous contrast. Multiplanar reconstructed images and MIPs were obtained and reviewed to evaluate the vascular anatomy. RADIATION DOSE REDUCTION: This exam was performed according to the departmental dose-optimization program which includes automated exposure control,  adjustment of the mA and/or kV according to patient size and/or use of iterative reconstruction technique. CONTRAST:  OMNIPAQUE IOHEXOL 350 MG/ML SOLN COMPARISON:  CT with IV contrast 03/06/2023 and 06/01/2022. FINDINGS: VASCULAR Aorta: There are mild-to-moderate calcific plaques without aneurysm, dissection or stenosis. No findings of acute vasculitis. Celiac: Patent without evidence of aneurysm, dissection, vasculitis or significant stenosis. There are minimal nonstenosing calcific plaques just past the vessel ostium. SMA: Patent without evidence of aneurysm, dissection, vasculitis or significant stenosis. There are trace nonstenosing calcific plaques proximal to the vessel inflection. Renals: Both renal arteries are patent without evidence of aneurysm, dissection, vasculitis, fibromuscular dysplasia or significant stenosis. There are no branch occlusions. IMA: Patent without evidence of aneurysm, dissection, vasculitis or significant stenosis. Inflow: Patent without evidence of aneurysm, dissection, vasculitis or significant stenosis. There are nonstenosing calcific plaques in the common iliac and internal iliac arteries. No plaque is seen involving the external iliac arteries. Proximal Outflow: Bilateral common femoral and visualized portions of the superficial and profunda femoral arteries are patent without evidence of aneurysm, dissection, vasculitis or significant stenosis. Veins: Patent.  No portal vein dilatation. Review of the MIP images confirms the above findings. NON-VASCULAR Lower chest: There is linear scarring or atelectasis in the lung bases and mild elevation of the right hemidiaphragm. The heart is slightly enlarged. There are three-vessel coronary calcifications. The cardiac blood pool is less dense than the myocardium on the noncontrast images consistent with anemia. Hepatobiliary: 2.2 cm hemangioma again noted in segment 4, unchanged. No mass enhancement. Gallbladder and bile ducts are  unremarkable. Pancreas: Partially atrophic.  No mass enhancement. Spleen: Normal. Adrenals/Urinary Tract: There is no adrenal mass. No renal solid mass enhancement. 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 Additional subcentimeter too small to characterize bilateral cortical Bosniak 2 cysts are also again shown. No interval change. There are clustered calyceal stones up to 1 cm in the inferior pole of the right kidney. No left nephrolithiasis is seen. No obstructing stones or hydronephrosis bilaterally. The bladder is unremarkable for the degree of distention. Stomach/Bowel: There is opaque material in the stomach which would obscure a vascular blush. Additional opaque material within scattered left abdominal small bowel segments. Elsewhere no active hemorrhage into the bowel is seen. The stomach and small bowel are unremarkable. An appendix is not seen. There is diffuse colonic diverticulosis without evidence of focal diverticulitis. The sigmoid wall is thicker than elsewhere but no more than previously. Lymphatic: No lymphadenopathy is seen. Reproductive: Status post hysterectomy. No adnexal masses. Multiple pelvic phleboliths. Other: No abdominal wall hernia or  abnormality. No abdominopelvic ascites. Musculoskeletal: Degenerative change lumbar spine, with the most advanced facet hypertrophy at L4-5 where there is grade 1 anterolisthesis. Degenerative change noted lower thoracic spine, most advanced at T9-10. IMPRESSION: 1. No active GI tract hemorrhage is seen. Opaque material in the stomach and left abdominal small bowel segments would obscure a vascular blush. 2. Aortic and coronary artery atherosclerosis. There is only a small amount of nonstenosing branch vessel atherosclerosis. 3. Anemia. 4. Diverticulosis without evidence of focal  diverticulitis. 5. Nonobstructing right nephrolithiasis. 6. Stable 2.2 cm hemangioma in segment 4 of the liver. 7. Multiple bilateral renal sinus cysts, larger nearly 12 cm right upper pole Bosniak 1 cyst, and additional subcentimeter too small to characterize Bosniak 2 cysts. No interval change. 8. 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 Aortic Atherosclerosis (ICD10-I70.0). Electronically Signed   By: Almira Bar M.D.   On: 03/09/2023 06:00     PMFS History: Patient Active Problem List   Diagnosis Date Noted   Lower GI bleed 03/09/2023   Unilateral primary osteoarthritis, left knee 11/26/2022   Dysuria 04/21/2022   Stiff-legged gait 04/21/2022   Scalp lesion 09/21/2021   Clavicle enlargement 04/05/2021   Hyperpigmentation 03/01/2021   Vitamin D deficiency 03/01/2021   Leg pain 05/21/2019   Right leg pain 05/21/2019   Healthcare maintenance 01/08/2018   Paresthesia 01/08/2018   Neck pain 08/31/2017   Angioedema 06/15/2017   Peptic ulcer of duodenum    Other specified disease of esophagus    Hematochezia    External hemorrhoids    Diverticulosis of large intestine without diverticulitis    Internal hemorrhoids    Duodenal ulceration    Advance care planning 12/23/2016   Renal stone 04/20/2016   Heartburn 12/17/2015   Medicare annual wellness visit, subsequent 06/05/2013   Hyperglycemia 06/05/2013   Knee pain 08/31/2012   Osteoporosis 08/18/2012   HTN (hypertension) 08/09/2012   HLD (hyperlipidemia) 08/09/2012   Past Medical History:  Diagnosis Date   Angioedema 06/15/2017   Arrhythmia    possible hx, resolved prev   Blood transfusion without reported diagnosis 1972   had reaction; had to stop   Carpal tunnel syndrome    Cataract 2019   resolved with surgery   Duodenal ulcer    H/O exercise stress test 2012   normal   Heart murmur    as child   High cholesterol    History of kidney stones    Hx of colonic polyps    Hypertension     Osteoporosis 2010   t score - 3.9   Peptic ulcer of duodenum    Renal stones    Vitamin D deficiency 03/01/2021    Family History  Problem Relation Age of Onset   Heart disease Mother    Renal Disease Mother    Heart failure Mother    Colon cancer Neg Hx    Breast cancer Neg Hx     Past Surgical History:  Procedure Laterality Date   ABDOMINAL HYSTERECTOMY  1972   for endometriosis   CATARACT EXTRACTION W/ INTRAOCULAR LENS IMPLANT Right 06/2017   CATARACT EXTRACTION W/ INTRAOCULAR LENS IMPLANT Left 07/2017   COLONOSCOPY Left 03/02/2017   Procedure: COLONOSCOPY;  Surgeon: Pasty Spillers, MD;  Location: [facility] ENDOSCOPY;  Service: Endoscopy;  Laterality: Left;   COLONOSCOPY WITH PROPOFOL N/A 05/04/2016   Procedure: COLONOSCOPY WITH PROPOFOL;  Surgeon: Wyline Mood, MD;  Location: [facility] ENDOSCOPY;  Service: Endoscopy;  Laterality: N/A;   ESOPHAGOGASTRODUODENOSCOPY  Left 03/02/2017   Procedure: ESOPHAGOGASTRODUODENOSCOPY (EGD);  Surgeon: Pasty Spillers, MD;  Location[facility] END[facility]COPY;  Service: Endoscopy;  Laterality: Left;   ESOPHAGOGASTRODUODENOSCOPY (EGD) WITH PROPOFOL N/A 04/08/2017   Procedure: ESOPHAGOGASTRODUODENOSCOPY (EGD) WITH PROPOFOL;  Surgeon: Pasty Spillers, MD;  Location: [facility] ENDOSCOPY;  Service: Endoscopy;  Laterality: N/A;   LITHOTRIPSY     for kidney stone x5   LITHOTRIPSY  09/2017   Social History   Occupational History   Occupation: Retired    Comment: Product manager  Tobacco Use   Smoking status: Former    Current packs/day: 0.00    Types: Cigarettes    Quit date: 01/19/1991    Years since quitting: 32.1   Smokeless tobacco: Never  Vaping Use   Vaping status: Never Used  Substance and Sexual Activity   Alcohol use: No    Comment: rare wine   Drug use: No   Sexual activity: Not on file

## 2023-03-09 NOTE — H&P (Addendum)
History and Physical    PatientLaini Hoover UJW:[id number] DOB: 10/23/1945 DOA: 03/09/2023 DOS: the patient was seen and examined on 03/09/2023 PCP: Joaquim Nam, MD  Patient coming from: Home  Chief Complaint:  Chief Complaint  Patient presents with   Rectal Bleeding    Patient was seen in [place] at [facility] for rectal bleeding Sunday. Patient was told to come back to the ER if her stool were dark. Patient reported 3 dark stools today per patient.    HPI: Patricia Hoover is a 78 y.o. female with medical history significant of questionable arrhythmia, angioedema, carpal tunnel syndrome, cataracts, cataract surgery, duodenal ulcer, heart murmur, hyperlipidemia, nephrolithiasis, colon polyps, hypertension, osteoporosis, vitamin D deficiency who was seen on Sunday at [facility] due to rectal bleeding and return to the emergency department earlier this morning due to having 3 hematochezia episodes since yesterday p.m.  No abdominal pain, nausea or emesis.  Denied fever, chills, rhinorrhea, sore throat, wheezing or hemoptysis.  No chest pain, palpitations, diaphoresis, PND, orthopnea or pitting edema of the lower extremities.  No flank pain, dysuria, frequency or hematuria.  No polyuria, polydipsia, polyphagia or blurred vision.   Lab work: Urinalysis was normal.  Fecal occult blood was positive.  CBC showed white count 9.7, hemoglobin 9.6 g/dL platelets 562.  Baseline hemoglobin about 4 g from baseline CMP showed a potassium of 2.7 mmol/L, glucose 149 mg/dL and AST of 14 units/L, the rest of the CMP measurements were normal after calcium correction.  Imaging: CTA GI bleed with no active GI tract hemorrhage.  Opaque material in the stomach and left abdominal small bowel segments with vascular vascular flush.  Aortic and coronary atherosclerosis.  There is only a small amount of known stenosis of branch vessel atherosclerosis.  Anemia.  Diverticulosis without diverticulitis.  Nonobstructing right  nephrolithiasis.  2.2 stable hemangioma of the liver.  No interval change in multiple bilateral renal cysts.  Lumbar DDD.  CT scan done on Sunday at [facility] was questionable for diverticulitis.   ED course: Initial vital signs were temperature 98.7 F, pulse 85, respiration 17, BP 145/126 mmHg O2 sat 100% on room air.  The patient received 1000 mL normal saline bolus, pantoprazole 40 mg IVP, KCl 10 mEq IVPB x 1 and KCl 40 mEq p.o. x 1.  Review of Systems: As mentioned in the history of present illness. All other systems reviewed and are negative. Past Medical History:  Diagnosis Date   Arrhythmia    possible hx, resolved prev   Blood transfusion without reported diagnosis 1972   had reaction; had to stop   Carpal tunnel syndrome    Cataract 2019   resolved with surgery   Duodenal ulcer    H/O exercise stress test 2012   normal   Heart murmur    as child   High cholesterol    History of kidney stones    Hx of colonic polyps    Hypertension    Osteoporosis 2010   t score - 3.9   Peptic ulcer of duodenum    Renal stones    Vitamin D deficiency 03/01/2021   Past Surgical History:  Procedure Laterality Date   ABDOMINAL HYSTERECTOMY  1972   for endometriosis   CATARACT EXTRACTION W/ INTRAOCULAR LENS IMPLANT Right 06/2017   CATARACT EXTRACTION W/ INTRAOCULAR LENS IMPLANT Left 07/2017   COLONOSCOPY Left 03/02/2017   Procedure: COLONOSCOPY;  Surgeon: Pasty Spillers, MD;  Location: [facility] ENDOSCOPY;  Service: Endoscopy;  Laterality: Left;  COLONOSCOPY WITH PROPOFOL N/A 05/04/2016   Procedure: COLONOSCOPY WITH PROPOFOL;  Surgeon: Wyline Mood, MD;  Location[facility] ENDOSCOPY;  Service: Endoscopy;  Laterality: N/A;   ESOPHAGOGASTRODUODENOSCOPY Left 03/02/2017   Procedure: ESOPHAGOGASTRODUODENOSCOPY (EGD);  Surgeon: Pasty Spillers, MD;  Location: [facility] ENDOSCOPY;  Service: Endoscopy;  Laterality: Left;   ESOPHAGOGASTRODUODENOSCOPY (EGD) WITH PROPOFOL N/A 04/08/2017   Procedure:  ESOPHAGOGASTRODUODENOSCOPY (EGD) WITH PROPOFOL;  Surgeon: Pasty Spillers, MD;  Location: [facility] ENDOSCOPY;  Service: Endoscopy;  Laterality: N/A;   LITHOTRIPSY     for kidney stone x5   LITHOTRIPSY  09/2017   Social History:  reports that she quit smoking about 32 years ago. Her smoking use included cigarettes. She has never used smokeless tobacco. She reports that she does not drink alcohol and does not use drugs.  Allergies  Allergen Reactions   Ace Inhibitors Swelling   Angiotensin Receptor Blockers     Would avoid, h/o swelling with ACE prev   Aspirin Other (See Comments)    GI bleed   Bisphosphonates     GI side effects   Nsaids     GI bleed   Prolia [Denosumab]     GI upset    Family History  Problem Relation Age of Onset   Heart disease Mother    Renal Disease Mother    Heart failure Mother    Colon cancer Neg Hx    Breast cancer Neg Hx     Prior to Admission medications   Medication Sig Start Date End Date Taking? Authorizing Provider  amLODipine (NORVASC) 2.5 MG tablet Take 1 tablet (2.5 mg total) by mouth daily. 03/04/23   Joaquim Nam, MD  amoxicillin-clavulanate (AUGMENTIN) 875-125 MG tablet Take 1 tablet by mouth 2 (two) times daily for 5 days. 03/06/23 03/11/23  Janith Lima, MD  Ascorbic Acid (VITAMIN C GUMMIE PO) Take 2 each by mouth daily.    [provider]  Cholecalciferol (VITAMIN D3) 50 MCG (2000 UT) capsule Take 2 capsules (4,000 Units total) by mouth daily. 03/01/21   Joaquim Nam, MD  indapamide (LOZOL) 2.5 MG tablet Take 1 tablet (2.5 mg total) by mouth daily. 03/04/23   Joaquim Nam, MD  Potassium Citrate 15 MEQ (1620 MG) TBCR Take 1 tablet by mouth in the morning and at bedtime. 03/04/23   Joaquim Nam, MD  simvastatin (ZOCOR) 10 MG tablet Take 1 tablet (10 mg total) by mouth every other day. 03/04/23   Joaquim Nam, MD    Physical Exam: Vitals:   03/09/23 0430 03/09/23 0435 03/09/23 0442 03/09/23 0600  BP:  126/64   (!) 144/73  Pulse:  82  83  Resp:  12  17  Temp: 98 F (36.7 C)   98.3 F (36.8 C)  TempSrc: Oral     SpO2:  100%  100%  Weight:   68 kg   Height:   4\' 9"  (1.448 m)    Physical Exam Vitals and nursing note reviewed.  Constitutional:      General: She is awake. She is not in acute distress.    Appearance: Normal appearance. She is ill-appearing.  HENT:     Head: Normocephalic.     Nose: No rhinorrhea.     Mouth/Throat:     Mouth: Mucous membranes are moist.  Eyes:     General: No scleral icterus.    Pupils: Pupils are equal, round, and reactive to light.  Neck:     Vascular: No JVD.  Cardiovascular:     Rate and Rhythm: Normal rate and regular rhythm.     Heart sounds: S1 normal and S2 normal.  Pulmonary:     Effort: Pulmonary effort is normal.     Breath sounds: Normal breath sounds. No wheezing, rhonchi or rales.  Abdominal:     General: Bowel sounds are normal. There is no distension.     Palpations: Abdomen is soft.     Tenderness: There is no abdominal tenderness.  Musculoskeletal:     Cervical back: Neck supple.     Right lower leg: No edema.     Left lower leg: No edema.  Skin:    General: Skin is warm and dry.  Neurological:     General: No focal deficit present.     Mental Status: She is alert and oriented to person, place, and time.  Psychiatric:        Mood and Affect: Mood normal.        Behavior: Behavior normal. Behavior is cooperative.    Data Reviewed:  Results are pending, will review when available.  Assessment and Plan: Principal Problem: ABLA (acute blood loss anemia) In the setting of:   Lower GI bleed Admit to stepdown/inpatient. Clear liquid diet. Keep NPO after midnight. Continue IV fluids. Monitor H&H. Transfuse as needed. GI consult greatly appreciated.  Active Problems:   HTN (hypertension) Hold antihypertensives for now. Close blood pressure monitoring.    HLD (hyperlipidemia)   Aortic atherosclerosis (HCC) Continue  simvastatin 10 mg p.o. daily.    Hyperglycemia Check fasting glucose in AM. Further workup depending on results.    Hypokalemia Replacing. Follow-up potassium level in the morning.     Advance Care Planning:   Code Status: Full Code   Consults: [facility] (Dr.  Burnard Bunting MD)  Family Communication:   Severity of Illness: The appropriate patient status for this patient is INPATIENT. Inpatient status is judged to be reasonable and necessary in order to provide the required intensity of service to ensure the patient's safety. The patient's presenting symptoms, physical exam findings, and initial radiographic and laboratory data in the context of their chronic comorbidities is felt to place them at high risk for further clinical deterioration. Furthermore, it is not anticipated that the patient will be medically stable for discharge from the hospital within 2 midnights of admission.   * I certify that at the point of admission it is my clinical judgment that the patient will require inpatient hospital care spanning beyond 2 midnights from the point of admission due to high intensity of service, high risk for further deterioration and high frequency of surveillance required.*  Author: Bobette Mo, MD 03/09/2023 7:57 AM  For on call review [email or web address].   This document was prepared using Dragon voice recognition software and may contain some unintended transcription errors.

## 2023-03-09 NOTE — Consult Note (Signed)
[facility] Consult  Referring Provider: Luther Hearing, PA/ER Primary Care Physician:  Joaquim Nam, MD Primary Gastroenterologist: Hetty Ely  Reason for Consultation:  Rectal bleeding  HPI: Patricia Hoover is a 78 y.o. female states that she was in her usual state of health until 03/06/2023 when she had 5 episodes of bright red, moderate amount of rectal bleeding which prompted her to go to [facility] ER. There she had a CAT scan which showed questionable diverticulitis and she was discharged on amoxicillin and clavulanate. Patient states that she took Aleve with possible aspirin for knee pain and had dark black stools, 3 episodes, which prompted her to come to the ER. She denies abdominal pain, nausea, vomiting, fever.  She is not on any blood thinners. Normally she has about 2 bowel movements a day. She recalls prior diverticular bleed which resolved on its own, many years ago.  She denies recent unintentional weight loss, loss of appetite, acid reflux, heartburn, difficulty swallowing or pain on swallowing.  Previous GI workup: EGD 04/08/2017, [facility]: Unremarkable, biopsies negative from duodenal deformity and negative for H. pylori Colonoscopy, [facility], hematochezia 2019: Diverticulosis in sigmoid, descending, ascending EGD 03/02/2017, hematochezia, [facility] : Nonbleeding duodenal ulcer, H. pylori associated gastritis Colonoscopy 2018, [facility]: Hyperplastic ascending polyp, tubular adenoma from ascending removed, diverticulosis noted in entire colon Colonoscopy, Dr. Rhea Belton, 2016: 1 tubular adenoma removed from transverse, diverticulosis in left and right colon Colonoscopy 2007: 3 mm transverse colon removed, diverticulosis  Past Medical History:  Diagnosis Date   Angioedema 06/15/2017   Arrhythmia    possible hx, resolved prev   Blood transfusion without reported diagnosis 1972   had reaction; had to stop   Carpal tunnel syndrome    Cataract 2019    resolved with surgery   Duodenal ulcer    H/O exercise stress test 2012   normal   Heart murmur    as child   High cholesterol    History of kidney stones    Hx of colonic polyps    Hypertension    Osteoporosis 2010   t score - 3.9   Peptic ulcer of duodenum    Renal stones    Vitamin D deficiency 03/01/2021    Past Surgical History:  Procedure Laterality Date   ABDOMINAL HYSTERECTOMY  1972   for endometriosis   CATARACT EXTRACTION W/ INTRAOCULAR LENS IMPLANT Right 06/2017   CATARACT EXTRACTION W/ INTRAOCULAR LENS IMPLANT Left 07/2017   COLONOSCOPY Left 03/02/2017   Procedure: COLONOSCOPY;  Surgeon: Pasty Spillers, MD;  Location: [facility] ENDOSCOPY;  Service: Endoscopy;  Laterality: Left;   COLONOSCOPY WITH PROPOFOL N/A 05/04/2016   Procedure: COLONOSCOPY WITH PROPOFOL;  Surgeon: Wyline Mood, MD;  Location: [facility] ENDOSCOPY;  Service: Endoscopy;  Laterality: N/A;   ESOPHAGOGASTRODUODENOSCOPY Left 03/02/2017   Procedure: ESOPHAGOGASTRODUODENOSCOPY (EGD);  Surgeon: Pasty Spillers, MD;  Location: [facility] ENDOSCOPY;  Service: Endoscopy;  Laterality: Left;   ESOPHAGOGASTRODUODENOSCOPY (EGD) WITH PROPOFOL N/A 04/08/2017   Procedure: ESOPHAGOGASTRODUODENOSCOPY (EGD) WITH PROPOFOL;  Surgeon: Pasty Spillers, MD;  Location: [facility] ENDOSCOPY;  Service: Endoscopy;  Laterality: N/A;   LITHOTRIPSY     for kidney stone x5   LITHOTRIPSY  09/2017    Prior to Admission medications   Medication Sig Start Date End Date Taking? Authorizing Provider  amLODipine (NORVASC) 2.5 MG tablet Take 1 tablet (2.5 mg total) by mouth daily. 03/04/23  Yes Joaquim Nam, MD  amoxicillin-clavulanate (AUGMENTIN) 875-125 MG tablet Take 1 tablet by mouth 2 (two) times  daily for 5 days. 03/06/23 03/11/23 Yes Janith Lima, MD  Ascorbic Acid (VITAMIN C GUMMIE PO) Take 2 each by mouth daily.   Yes [provider]  Cholecalciferol (VITAMIN D3) 50 MCG (2000 UT) capsule Take 2 capsules (4,000 Units total)  by mouth daily. 03/01/21  Yes Joaquim Nam, MD  indapamide (LOZOL) 2.5 MG tablet Take 1 tablet (2.5 mg total) by mouth daily. 03/04/23  Yes Joaquim Nam, MD  Potassium Citrate 15 MEQ (1620 MG) TBCR Take 1 tablet by mouth in the morning and at bedtime. 03/04/23  Yes Joaquim Nam, MD  simvastatin (ZOCOR) 10 MG tablet Take 1 tablet (10 mg total) by mouth every other day. 03/04/23  Yes Joaquim Nam, MD    Current Facility-Administered Medications  Medication Dose Route Frequency Provider Last Rate Last Admin   acetaminophen (TYLENOL) tablet 650 mg  650 mg Oral Q6H PRN Bobette Mo, MD       Or   acetaminophen (TYLENOL) suppository 650 mg  650 mg Rectal Q6H PRN Bobette Mo, MD       ondansetron [facility]) tablet 4 mg  4 mg Oral Q6H PRN Bobette Mo, MD       Or   ondansetron [facility]) injection 4 mg  4 mg Intravenous Q6H PRN Bobette Mo, MD       Current Outpatient Medications  Medication Sig Dispense Refill   amLODipine (NORVASC) 2.5 MG tablet Take 1 tablet (2.5 mg total) by mouth daily. 90 tablet 3   amoxicillin-clavulanate (AUGMENTIN) 875-125 MG tablet Take 1 tablet by mouth 2 (two) times daily for 5 days. 10 tablet 0   Ascorbic Acid (VITAMIN C GUMMIE PO) Take 2 each by mouth daily.     Cholecalciferol (VITAMIN D3) 50 MCG (2000 UT) capsule Take 2 capsules (4,000 Units total) by mouth daily.     indapamide (LOZOL) 2.5 MG tablet Take 1 tablet (2.5 mg total) by mouth daily. 90 tablet 3   Potassium Citrate 15 MEQ (1620 MG) TBCR Take 1 tablet by mouth in the morning and at bedtime. 180 tablet 3   simvastatin (ZOCOR) 10 MG tablet Take 1 tablet (10 mg total) by mouth every other day. 45 tablet 3    Allergies as of 03/08/2023 - Review Complete 03/08/2023  Allergen Reaction Noted   Ace inhibitors Swelling 06/13/2017   Angiotensin receptor blockers  06/14/2017   Aspirin Other (See Comments) 03/15/2017   Bisphosphonates  08/18/2012   Nsaids  03/15/2017    Prolia [denosumab]  06/21/2013    Family History  Problem Relation Age of Onset   Heart disease Mother    Renal Disease Mother    Heart failure Mother    Colon cancer Neg Hx    Breast cancer Neg Hx     Social History   Socioeconomic History   Marital status: Widowed    Spouse name: Not on file   Number of children: Not on file   Years of education: Not on file   Highest education level: Not on file  Occupational History   Occupation: Retired    Comment: Product manager  Tobacco Use   Smoking status: Former    Current packs/day: 0.00    Types: Cigarettes    Quit date: 01/19/1991    Years since quitting: 32.1   Smokeless tobacco: Never  Vaping Use   Vaping status: Never Used  Substance and Sexual Activity   Alcohol use: No  Comment: rare wine   Drug use: No   Sexual activity: Not on file  Other Topics Concern   Not on file  Social History Narrative   Education:  1 year Business School   Retired back to [employer] after working in [employer]   Widowed '93   2 kids   Social Drivers of Corporate investment banker Strain: Low Risk  (01/08/2021)   Overall Financial Resource Strain (CARDIA)    Difficulty of Paying Living Expenses: Not hard at all  Food Insecurity: No Food Insecurity (01/08/2021)   Hunger Vital Sign    Worried About Running Out of Food in the Last Year: Never true    Ran Out of Food in the Last Year: Never true  Transportation Needs: No Transportation Needs (01/08/2021)   PRAPARE - Administrator, Civil Service (Medical): No    Lack of Transportation (Non-Medical): No  Physical Activity: Insufficiently Active (01/08/2021)   Exercise Vital Sign    Days of Exercise per Week: 2 days    Minutes of Exercise per Session: 60 min  Stress: No Stress Concern Present (01/08/2021)   [employer] of Occupational Health - Occupational Stress Questionnaire    Feeling of Stress : Not at all  Social Connections:  Moderately Integrated (01/08/2021)   Social Connection and Isolation Panel [NHANES]    Frequency of Communication with Friends and Family: More than three times a week    Frequency of Social Gatherings with Friends and Family: More than three times a week    Attends Religious Services: More than 4 times per year    Active Member of [employer] or Organizations: Yes    Attends Banker Meetings: More than 4 times per year    Marital Status: Widowed  Intimate Partner Violence: Not At Risk (01/08/2021)   Humiliation, Afraid, Rape, and Kick questionnaire    Fear of Current or Ex-Partner: No    Emotionally Abused: No    Physically Abused: No    Sexually Abused: No    Review of Systems: As per HPI  Physical Exam: Vital signs in last 24 hours: Temp:  [98 F (36.7 C)-98.7 F (37.1 C)] 98.5 F (36.9 C) (02/19 1113) Pulse Rate:  [74-96] 77 (02/19 1113) Resp:  [12-17] 14 (02/19 1113) BP: (117-145)/(64-126) 138/65 (02/19 1113) SpO2:  [100 %] 100 % (02/19 1113) Weight:  [68 kg] 68 kg (02/19 0442)    General:   Alert,  Well-developed, overweight, pleasant and cooperative in NAD Head:  Normocephalic and atraumatic. Eyes:  Sclera clear, no icterus.   Mild pallor Ears:  Normal auditory acuity. Nose:  No deformity, discharge,  or lesions. Mouth:  No deformity or lesions.  Oropharynx pink & moist. Neck:  Supple; no masses or thyromegaly. Lungs:  Clear throughout to auscultation.   No wheezes, crackles, or rhonchi. No acute distress. Heart:  Regular rate and rhythm; no murmurs, clicks, rubs,  or gallops. Extremities:  Without clubbing or edema. Neurologic:  Alert and  oriented x4;  grossly normal neurologically. Skin:  Intact without significant lesions or rashes. Psych:  Alert and cooperative. Normal mood and affect. Abdomen:  Soft, nontender and nondistended. No masses, hepatosplenomegaly or hernias noted. Normal bowel sounds, without guarding, and without rebound.         Lab  Results: Recent Labs    03/09/23 0347 03/09/23 0746  WBC 9.7  --   HGB 9.6* 8.8*  HCT 27.9* 25.9*  PLT  244  --    BMET Recent Labs    03/09/23 0241  NA 140  K 2.7*  CL 101  CO2 32  GLUCOSE 149*  BUN 21  CREATININE 0.58  CALCIUM 8.8*   LFT Recent Labs    03/09/23 0241  PROT 6.8  ALBUMIN 3.6  AST 14*  ALT 12  ALKPHOS 62  BILITOT 0.3   PT/INR No results for input(s): "LABPROT", "INR" in the last 72 hours.  Studies/Results: CT ANGIO GI BLEED Result Date: 03/09/2023 CLINICAL DATA:  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 EXAM: CTA ABDOMEN AND PELVIS WITHOUT AND WITH CONTRAST TECHNIQUE: Multidetector CT imaging of the abdomen and pelvis was performed using the standard protocol during bolus administration of intravenous contrast. Multiplanar reconstructed images and MIPs were obtained and reviewed to evaluate the vascular anatomy. RADIATION DOSE REDUCTION: This exam was performed according to the departmental dose-optimization program which includes automated exposure control, adjustment of the mA and/or kV according to patient size and/or use of iterative reconstruction technique. CONTRAST:  OMNIPAQUE IOHEXOL 350 MG/ML SOLN COMPARISON:  CT with IV contrast 03/06/2023 and 06/01/2022. FINDINGS: VASCULAR Aorta: There are mild-to-moderate calcific plaques without aneurysm, dissection or stenosis. No findings of acute vasculitis. Celiac: Patent without evidence of aneurysm, dissection, vasculitis or significant stenosis. There are minimal nonstenosing calcific plaques just past the vessel ostium. SMA: Patent without evidence of aneurysm, dissection, vasculitis or significant stenosis. There are trace nonstenosing calcific plaques proximal to the vessel inflection. Renals: Both renal arteries are patent without evidence of aneurysm, dissection, vasculitis, fibromuscular dysplasia or significant stenosis. There are no branch occlusions. IMA: Patent without evidence of  aneurysm, dissection, vasculitis or significant stenosis. Inflow: Patent without evidence of aneurysm, dissection, vasculitis or significant stenosis. There are nonstenosing calcific plaques in the common iliac and internal iliac arteries. No plaque is seen involving the external iliac arteries. Proximal Outflow: Bilateral common femoral and visualized portions of the superficial and profunda femoral arteries are patent without evidence of aneurysm, dissection, vasculitis or significant stenosis. Veins: Patent.  No portal vein dilatation. Review of the MIP images confirms the above findings. NON-VASCULAR Lower chest: There is linear scarring or atelectasis in the lung bases and mild elevation of the right hemidiaphragm. The heart is slightly enlarged. There are three-vessel coronary calcifications. The cardiac blood pool is less dense than the myocardium on the noncontrast images consistent with anemia. Hepatobiliary: 2.2 cm hemangioma again noted in segment 4, unchanged. No mass enhancement. Gallbladder and bile ducts are unremarkable. Pancreas: Partially atrophic.  No mass enhancement. Spleen: Normal. Adrenals/Urinary Tract: There is no adrenal mass. No renal solid mass enhancement. 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 Additional subcentimeter too small to characterize bilateral cortical Bosniak 2 cysts are also again shown. No interval change. There are clustered calyceal stones up to 1 cm in the inferior pole of the right kidney. No left nephrolithiasis is seen. No obstructing stones or hydronephrosis bilaterally. The bladder is unremarkable for the degree of distention. Stomach/Bowel: There is opaque material in the stomach which would obscure a vascular blush. Additional opaque material within scattered left abdominal small bowel segments.  Elsewhere no active hemorrhage into the bowel is seen. The stomach and small bowel are unremarkable. An appendix is not seen. There is diffuse colonic diverticulosis without evidence of focal diverticulitis. The sigmoid wall  is thicker than elsewhere but no more than previously. Lymphatic: No lymphadenopathy is seen. Reproductive: Status post hysterectomy. No adnexal masses. Multiple pelvic phleboliths. Other: No abdominal wall hernia or abnormality. No abdominopelvic ascites. Musculoskeletal: Degenerative change lumbar spine, with the most advanced facet hypertrophy at L4-5 where there is grade 1 anterolisthesis. Degenerative change noted lower thoracic spine, most advanced at T9-10. IMPRESSION: 1. No active GI tract hemorrhage is seen. Opaque material in the stomach and left abdominal small bowel segments would obscure a vascular blush. 2. Aortic and coronary artery atherosclerosis. There is only a small amount of nonstenosing branch vessel atherosclerosis. 3. Anemia. 4. Diverticulosis without evidence of focal diverticulitis. 5. Nonobstructing right nephrolithiasis. 6. Stable 2.2 cm hemangioma in segment 4 of the liver. 7. Multiple bilateral renal sinus cysts, larger nearly 12 cm right upper pole Bosniak 1 cyst, and additional subcentimeter too small to characterize Bosniak 2 cysts. No interval change. 8. 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 Aortic Atherosclerosis (ICD10-I70.0). Electronically Signed   By: Almira Bar M.D.   On: 03/09/2023 06:00    Impression: Painless hematochezia, baseline hemoglobin 13.1 dropped to 9.6 and subsequently 8.8 Pandiverticulosis noted on prior colonoscopies CT angio did not show active GI hemorrhage but showed diverticulosis without diverticulitis   Hypokalemia, potassium 2.7  Plan: Discussed with patient that majority cases of diverticular bleed resolve on their own. She is hemodynamically stable, has not required blood  transfusion, does not have evidence of active bleed on CT angio. Will start on clear liquid diet, I anticipate we might be able to advance her diet tomorrow. If there is no further rectal bleeding, hemoglobin stays stable, possible discharge home tomorrow.   LOS: 0 days   Kerin Salen, MD  03/09/2023, 11:40 AM

## 2023-03-09 NOTE — ED Provider Notes (Signed)
[place] EMERGENCY DEPARTMENT AT [facility] Provider Note   CSN: [id number] Arrival date & time: 03/08/23  2339     History  Chief Complaint  Patient presents with   Rectal Bleeding    Patient was seen in [place] at [facility] for rectal bleeding Sunday. Patient was told to come back to the ER if her stool were dark. Patient reported 3 dark stools today per patient.     Patricia Hoover is a 78 y.o. female with a past medical history seen for hypertension, hyperlipidemia diverticulitis who presents with concern for rectal bleeding without significant abdominal pain.  Patient was seen at [facility] in [place] for rectal bleeding on Sunday, diagnosed with diverticulitis at that time.  She did not receive any guidance on changing her diet reports that she has been eating solids like normal.  She reports that she was placed on a new antibiotic and has been taking it as prescribed.  She reports that she only has bleeding during bowel movements, denies worsening abdominal pain.   Rectal Bleeding      Home Medications Prior to Admission medications   Medication Sig Start Date End Date Taking? Authorizing Provider  amLODipine (NORVASC) 2.5 MG tablet Take 1 tablet (2.5 mg total) by mouth daily. 03/04/23   Joaquim Nam, MD  amoxicillin-clavulanate (AUGMENTIN) 875-125 MG tablet Take 1 tablet by mouth 2 (two) times daily for 5 days. 03/06/23 03/11/23  Janith Lima, MD  Ascorbic Acid (VITAMIN C GUMMIE PO) Take 2 each by mouth daily.    [provider]  Cholecalciferol (VITAMIN D3) 50 MCG (2000 UT) capsule Take 2 capsules (4,000 Units total) by mouth daily. 03/01/21   Joaquim Nam, MD  indapamide (LOZOL) 2.5 MG tablet Take 1 tablet (2.5 mg total) by mouth daily. 03/04/23   Joaquim Nam, MD  Potassium Citrate 15 MEQ (1620 MG) TBCR Take 1 tablet by mouth in the morning and at bedtime. 03/04/23   Joaquim Nam, MD  simvastatin (ZOCOR) 10 MG tablet Take 1 tablet  (10 mg total) by mouth every other day. 03/04/23   Joaquim Nam, MD      Allergies    Ace inhibitors, Angiotensin receptor blockers, Aspirin, Bisphosphonates, Nsaids, and Prolia [denosumab]    Review of Systems   Review of Systems  Gastrointestinal:  Positive for hematochezia.  All other systems reviewed and are negative.   Physical Exam Updated Vital Signs BP 126/64   Pulse 82   Temp 98 F (36.7 C) (Oral)   Resp 12   Ht 4\' 9"  (1.448 m)   Wt 68 kg   SpO2 100%   BMI 32.46 kg/m  Physical Exam Vitals and nursing note reviewed.  Constitutional:      General: She is not in acute distress.    Appearance: Normal appearance.  HENT:     Head: Normocephalic and atraumatic.  Eyes:     General:        Right eye: No discharge.        Left eye: No discharge.  Cardiovascular:     Rate and Rhythm: Normal rate and regular rhythm.     Heart sounds: No murmur heard.    No friction rub. No gallop.  Pulmonary:     Effort: Pulmonary effort is normal.     Breath sounds: Normal breath sounds.  Abdominal:     General: Bowel sounds are normal.     Palpations: Abdomen is soft.  Genitourinary:  Comments: Patient with dark blood clots in rectal vault but no active bleeding at time my evaluation, small external hemorrhoid noted as well. Skin:    General: Skin is warm and dry.     Capillary Refill: Capillary refill takes less than 2 seconds.  Neurological:     Mental Status: She is alert and oriented to person, place, and time.  Psychiatric:        Mood and Affect: Mood normal.        Behavior: Behavior normal.     ED Results / Procedures / Treatments   Labs (all labs ordered are listed, but only abnormal results are displayed) Labs Reviewed  COMPREHENSIVE METABOLIC PANEL - Abnormal; Notable for the following components:      Result Value   Potassium 2.7 (*)    Glucose, Bld 149 (*)    Calcium 8.8 (*)    AST 14 (*)    All other components within normal limits  CBC -  Abnormal; Notable for the following components:   RBC 3.19 (*)    Hemoglobin 9.6 (*)    HCT 27.9 (*)    All other components within normal limits  POC OCCULT BLOOD, ED - Abnormal; Notable for the following components:   Fecal Occult Bld POSITIVE (*)    All other components within normal limits  URINALYSIS, ROUTINE W REFLEX MICROSCOPIC  TYPE AND SCREEN    EKG None  Radiology CT ANGIO GI BLEED Result Date: 03/09/2023 CLINICAL DATA:  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 EXAM: CTA ABDOMEN AND PELVIS WITHOUT AND WITH CONTRAST TECHNIQUE: Multidetector CT imaging of the abdomen and pelvis was performed using the standard protocol during bolus administration of intravenous contrast. Multiplanar reconstructed images and MIPs were obtained and reviewed to evaluate the vascular anatomy. RADIATION DOSE REDUCTION: This exam was performed according to the departmental dose-optimization program which includes automated exposure control, adjustment of the mA and/or kV according to patient size and/or use of iterative reconstruction technique. CONTRAST:  OMNIPAQUE IOHEXOL 350 MG/ML SOLN COMPARISON:  CT with IV contrast 03/06/2023 and 06/01/2022. FINDINGS: VASCULAR Aorta: There are mild-to-moderate calcific plaques without aneurysm, dissection or stenosis. No findings of acute vasculitis. Celiac: Patent without evidence of aneurysm, dissection, vasculitis or significant stenosis. There are minimal nonstenosing calcific plaques just past the vessel ostium. SMA: Patent without evidence of aneurysm, dissection, vasculitis or significant stenosis. There are trace nonstenosing calcific plaques proximal to the vessel inflection. Renals: Both renal arteries are patent without evidence of aneurysm, dissection, vasculitis, fibromuscular dysplasia or significant stenosis. There are no branch occlusions. IMA: Patent without evidence of aneurysm, dissection, vasculitis or significant stenosis. Inflow:  Patent without evidence of aneurysm, dissection, vasculitis or significant stenosis. There are nonstenosing calcific plaques in the common iliac and internal iliac arteries. No plaque is seen involving the external iliac arteries. Proximal Outflow: Bilateral common femoral and visualized portions of the superficial and profunda femoral arteries are patent without evidence of aneurysm, dissection, vasculitis or significant stenosis. Veins: Patent.  No portal vein dilatation. Review of the MIP images confirms the above findings. NON-VASCULAR Lower chest: There is linear scarring or atelectasis in the lung bases and mild elevation of the right hemidiaphragm. The heart is slightly enlarged. There are three-vessel coronary calcifications. The cardiac blood pool is less dense than the myocardium on the noncontrast images consistent with anemia. Hepatobiliary: 2.2 cm hemangioma again noted in segment 4, unchanged. No mass enhancement. Gallbladder and bile ducts are unremarkable. Pancreas: Partially atrophic.  No mass enhancement. Spleen: Normal. Adrenals/Urinary Tract: There is no adrenal mass. No renal solid mass enhancement. 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 Additional subcentimeter too small to characterize bilateral cortical Bosniak 2 cysts are also again shown. No interval change. There are clustered calyceal stones up to 1 cm in the inferior pole of the right kidney. No left nephrolithiasis is seen. No obstructing stones or hydronephrosis bilaterally. The bladder is unremarkable for the degree of distention. Stomach/Bowel: There is opaque material in the stomach which would obscure a vascular blush. Additional opaque material within scattered left abdominal small bowel segments. Elsewhere no active hemorrhage into the bowel is seen. The stomach  and small bowel are unremarkable. An appendix is not seen. There is diffuse colonic diverticulosis without evidence of focal diverticulitis. The sigmoid wall is thicker than elsewhere but no more than previously. Lymphatic: No lymphadenopathy is seen. Reproductive: Status post hysterectomy. No adnexal masses. Multiple pelvic phleboliths. Other: No abdominal wall hernia or abnormality. No abdominopelvic ascites. Musculoskeletal: Degenerative change lumbar spine, with the most advanced facet hypertrophy at L4-5 where there is grade 1 anterolisthesis. Degenerative change noted lower thoracic spine, most advanced at T9-10. IMPRESSION: 1. No active GI tract hemorrhage is seen. Opaque material in the stomach and left abdominal small bowel segments would obscure a vascular blush. 2. Aortic and coronary artery atherosclerosis. There is only a small amount of nonstenosing branch vessel atherosclerosis. 3. Anemia. 4. Diverticulosis without evidence of focal diverticulitis. 5. Nonobstructing right nephrolithiasis. 6. Stable 2.2 cm hemangioma in segment 4 of the liver. 7. Multiple bilateral renal sinus cysts, larger nearly 12 cm right upper pole Bosniak 1 cyst, and additional subcentimeter too small to characterize Bosniak 2 cysts. No interval change. 8. 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 Aortic Atherosclerosis (ICD10-I70.0). Electronically Signed   By: Almira Bar M.D.   On: 03/09/2023 06:00    Procedures Procedures    Medications Ordered in ED Medications  sodium chloride 0.9 % bolus 1,000 mL (has no administration in time range)  potassium chloride SA (KLOR-CON M) CR tablet 40 mEq (40 mEq Oral Given 03/09/23 0429)  potassium chloride 10 mEq in 100 mL IVPB ( Intravenous Infusion Verify 03/09/23 0521)  pantoprazole (PROTONIX) injection 40 mg (40 mg Intravenous Given 03/09/23 0426)  iohexol (OMNIPAQUE) 350 MG/ML injection 100 mL (100 mLs Intravenous Contrast Given 03/09/23 0453)     ED Course/ Medical Decision Making/ A&P                                 Medical Decision Making Amount and/or Complexity of Data Reviewed Labs: ordered. Radiology: ordered.  Risk Prescription drug management.   This patient is a 78 y.o. female  who presents to the ED for concern of abdominal pain, rectal bleeding.   Differential diagnoses prior to evaluation: The emergent differential diagnosis includes, but is not limited to, acute GI bleed, diverticular bleed, worsening diverticulitis, complication of diverticulitis including abscess, perforation, bleeding hemorrhoids, anemia requiring transfusion, versus other.. This is not an exhaustive differential.   Past Medical History / Co-morbidities / Social History: Hypertension, hyperlipidemia, diverticulosis, recently diagnosed diverticulitis  Additional history: Chart reviewed. Pertinent results include: Reviewed lab work, imaging from ED evaluation just a few days ago, notably with normal hemoglobin  around 13, CT showing diverticulosis, bowel wall thickening with suspicion of diverticulitis, no other abnormalities seen in the abdomen at that time.  Physical Exam: Physical exam performed. The pertinent findings include: Some newness to palpation, most focally in left lower quadrant with palpation, no significant tenderness endorsed by patient at rest.  Initially somewhat hypertensive, blood pressure 145/126, improved on repeat evaluation, 126/64.  No fever in ED.  Lab Tests/Imaging studies: I personally interpreted labs/imaging and the pertinent results include: CMP notable for hypokalemia, potassium 2.7, we will orally replete and begin IV potassium.  UA unremarkable, CBC notable for new anemia, hemoglobin 9.6 from recent baseline of 13.  Her Hemoccult is positive, I independently interpreted CT angio GI bleed which shows no active GI bleed, diverticulosis without evidence of diverticulitis. I agree with the radiologist  interpretation.    Medications: I ordered medication including Protonix, potassium for GI bleed, hypokalemia.  I have reviewed the patients home medicines and have made adjustments as needed.   Consults: Patient with stable vital signs, and 3-4 point hemoglobin drop with diverticular source for GI bleed, no active extravasation in the ED, given her new blood loss, hypokalemia I do think that she would benefit from hospital admission, GI eval.  Spoke with Dr. Lazarus Salines who agrees to admission at this time.  Disposition: After consideration of the diagnostic results and the patients response to treatment, I feel that patient would benefit from admission for diverticular bleed as discussed above .   Final Clinical Impression(s) / ED Diagnoses Final diagnoses:  Lower GI bleed  Rectal bleeding    Rx / DC Orders ED Discharge Orders     None         Olene Floss, PA-C 03/09/23 1610    Shon Baton, MD 03/09/23 [phone]

## 2023-03-09 NOTE — ED Notes (Signed)
 Pt ambulatory to restroom

## 2023-03-10 DIAGNOSIS — K5731 Diverticulosis of large intestine without perforation or abscess with bleeding: Secondary | ICD-10-CM

## 2023-03-10 DIAGNOSIS — K922 Gastrointestinal hemorrhage, unspecified: Secondary | ICD-10-CM | POA: Diagnosis not present

## 2023-03-10 DIAGNOSIS — K921 Melena: Secondary | ICD-10-CM | POA: Diagnosis not present

## 2023-03-10 DIAGNOSIS — K5730 Diverticulosis of large intestine without perforation or abscess without bleeding: Secondary | ICD-10-CM | POA: Diagnosis not present

## 2023-03-10 LAB — COMPREHENSIVE METABOLIC PANEL
ALT: 11 U/L (ref 0–44)
AST: 13 U/L — ABNORMAL LOW (ref 15–41)
Albumin: 3.1 g/dL — ABNORMAL LOW (ref 3.5–5.0)
Alkaline Phosphatase: 47 U/L (ref 38–126)
Anion gap: 7 (ref 5–15)
BUN: 10 mg/dL (ref 8–23)
CO2: 26 mmol/L (ref 22–32)
Calcium: 8.5 mg/dL — ABNORMAL LOW (ref 8.9–10.3)
Chloride: 107 mmol/L (ref 98–111)
Creatinine, Ser: 0.48 mg/dL (ref 0.44–1.00)
GFR, Estimated: >60 mL/min (ref >60)
Glucose, Bld: 114 mg/dL — ABNORMAL HIGH (ref 70–99)
Potassium: 3.5 mmol/L (ref 3.5–5.1)
Sodium: 140 mmol/L (ref 135–145)
Total Bilirubin: 0.5 mg/dL (ref 0.0–1.2)
Total Protein: 5.6 g/dL — ABNORMAL LOW (ref 6.5–8.1)

## 2023-03-10 LAB — CBC
HCT: 27.7 % — ABNORMAL LOW (ref 36.0–46.0)
Hemoglobin: 9.1 g/dL — ABNORMAL LOW (ref 12.0–15.0)
MCH: 29.3 pg (ref 26.0–34.0)
MCHC: 32.9 g/dL (ref 30.0–36.0)
MCV: 89.1 fL (ref 80.0–100.0)
Platelets: 238 10*3/uL (ref 150–400)
RBC: 3.11 MIL/uL — ABNORMAL LOW (ref 3.87–5.11)
RDW: 13.7 % (ref 11.5–15.5)
WBC: 6.6 10*3/uL (ref 4.0–10.5)
nRBC: 0 % (ref 0.0–0.2)

## 2023-03-10 MED ORDER — FERROUS SULFATE 300 (60 FE) MG/5ML PO SOLN: 300.0000 mg | Freq: Every day | ORAL | 0 refills | Status: DC

## 2023-03-10 NOTE — Care Management CC44 (Signed)
Condition Code 44 Documentation Completed  Patient Details  Name: Patricia Hoover MRN: [id number] Date of Birth: 1945-04-21   Condition Code 44 given:  Yes Patient signature on Condition Code 44 notice:  Yes Documentation of 2 MD's agreement:  Yes Code 44 added to claim:  Yes    Lanier Clam, RN 03/10/2023, 1:45 PM

## 2023-03-10 NOTE — Care Management Obs Status (Signed)
MEDICARE OBSERVATION STATUS NOTIFICATION   Patient Details  Name: Patricia Hoover MRN: [id number] Date of Birth: 1945-07-09   Medicare Observation Status Notification Given:  Yes    MahabirOlegario Messier, RN 03/10/2023, 1:45 PM

## 2023-03-10 NOTE — Progress Notes (Signed)
Subjective: Patient states she had 1 formed dark bowel movement today.  Denies abdominal pain.  She is ready for diet to be advanced.  Objective: Vital signs in last 24 hours: Temp:  [97.6 F (36.4 C)-99.8 F (37.7 C)] 99.1 F (37.3 C) (02/20 0819) Pulse Rate:  [66-93] 83 (02/20 0819) Resp:  [14-20] 16 (02/20 0309) BP: (112-139)/(62-87) 122/67 (02/20 0819) SpO2:  [96 %-100 %] 100 % (02/20 0819) Weight change:     PE: Not in distress GENERAL: Mild pallor no icterus  ABDOMEN: Soft, nondistended, nontender EXTREMITIES: No deformity  Lab Results: Results for orders placed or performed during the hospital encounter of 03/09/23 (from the past 48 hours)  POC occult blood, ED     Status: Abnormal   Collection Time: 03/09/23  2:27 AM  Result Value Ref Range   Fecal Occult Bld POSITIVE (A) NEGATIVE  Comprehensive metabolic panel     Status: Abnormal   Collection Time: 03/09/23  2:41 AM  Result Value Ref Range   Sodium 140 135 - 145 mmol/L   Potassium 2.7 (LL) 3.5 - 5.1 mmol/L    Comment: CRITICAL RESULT CALLED TO, READ BACK BY AND VERIFIED WITH K. HALL, RN 03/09/23 0346 BY K. DAVIS   Chloride 101 98 - 111 mmol/L   CO2 32 22 - 32 mmol/L   Glucose, Bld 149 (H) 70 - 99 mg/dL    Comment: Glucose reference range applies only to samples taken after fasting for at least 8 hours.   BUN 21 8 - 23 mg/dL   Creatinine, Ser 1.61 0.44 - 1.00 mg/dL   Calcium 8.8 (L) 8.9 - 10.3 mg/dL   Total Protein 6.8 6.5 - 8.1 g/dL   Albumin 3.6 3.5 - 5.0 g/dL   AST 14 (L) 15 - 41 U/L   ALT 12 0 - 44 U/L   Alkaline Phosphatase 62 38 - 126 U/L   Total Bilirubin 0.3 0.0 - 1.2 mg/dL   GFR, Estimated >09 >60 mL/min    Comment: (NOTE) Calculated using the CKD-EPI Creatinine Equation (2021)    Anion gap 7 5 - 15    Comment: Performed at [facility], [address], [place], [place] [address]  Type and screen [facility]     Status: None   Collection Time: 03/09/23  2:41  AM  Result Value Ref Range   ABO/RH(D) O POS    Antibody Screen NEG    Sample Expiration      03/12/2023,2359 Performed at [facility], [address] [place], [place] [address]   Phosphorus     Status: None   Collection Time: 03/09/23  2:41 AM  Result Value Ref Range   Phosphorus 3.3 2.5 - 4.6 mg/dL    Comment: Performed at [facility], [address] [place], [place] [address]  Magnesium     Status: None   Collection Time: 03/09/23  2:41 AM  Result Value Ref Range   Magnesium 2.3 1.7 - 2.4 mg/dL    Comment: Performed at [facility], [address], [place], [place] [address]  Urinalysis, Routine w reflex microscopic -Urine, Clean Catch     Status: None   Collection Time: 03/09/23  3:47 AM  Result Value Ref Range   Color, Urine YELLOW YELLOW   APPearance CLEAR CLEAR   Specific Gravity, Urine 1.026 1.005 - 1.030   pH 5.0 5.0 - 8.0   Glucose, UA NEGATIVE NEGATIVE mg/dL   Hgb  urine dipstick NEGATIVE NEGATIVE   Bilirubin Urine NEGATIVE NEGATIVE   Ketones, ur NEGATIVE NEGATIVE mg/dL   Protein, ur NEGATIVE NEGATIVE mg/dL   Nitrite NEGATIVE NEGATIVE   Leukocytes,Ua NEGATIVE NEGATIVE    Comment: Performed at [facility], [address], [place], [place] [address]  CBC     Status: Abnormal   Collection Time: 03/09/23  3:47 AM  Result Value Ref Range   WBC 9.7 4.0 - 10.5 K/uL   RBC 3.19 (L) 3.87 - 5.11 MIL/uL   Hemoglobin 9.6 (L) 12.0 - 15.0 g/dL   HCT 69.6 (L) 29.5 - 28.4 %   MCV 87.5 80.0 - 100.0 fL   MCH 30.1 26.0 - 34.0 pg   MCHC 34.4 30.0 - 36.0 g/dL   RDW 13.2 44.0 - 10.2 %   Platelets 244 150 - 400 K/uL   nRBC 0.0 0.0 - 0.2 %    Comment: Performed at [facility], [address], [place], [place] [address]  Hemoglobin and hematocrit, blood     Status: Abnormal   Collection Time: 03/09/23  7:46 AM  Result Value Ref Range   Hemoglobin 8.8 (L) 12.0 - 15.0 g/dL   HCT 64.4  (L) 03.4 - 46.0 %    Comment: Performed at [facility], [address], [place], [place] [address]  Hemoglobin and hematocrit, blood     Status: Abnormal   Collection Time: 03/09/23  3:39 PM  Result Value Ref Range   Hemoglobin 9.9 (L) 12.0 - 15.0 g/dL   HCT 56.3 (L) 87.5 - 64.3 %    Comment: Performed at [facility], [address] [place], [place] [address]  Hemoglobin and hematocrit, blood     Status: Abnormal   Collection Time: 03/09/23  7:26 PM  Result Value Ref Range   Hemoglobin 10.4 (L) 12.0 - 15.0 g/dL   HCT 88.4 (L) 16.6 - 06.3 %    Comment: Performed at [facility], [address], [place], [place] [address]  CBC     Status: Abnormal   Collection Time: 03/10/23  5:21 AM  Result Value Ref Range   WBC 6.6 4.0 - 10.5 K/uL   RBC 3.11 (L) 3.87 - 5.11 MIL/uL   Hemoglobin 9.1 (L) 12.0 - 15.0 g/dL   HCT 09.3 (L) 23.5 - 57.3 %   MCV 89.1 80.0 - 100.0 fL   MCH 29.3 26.0 - 34.0 pg   MCHC 32.9 30.0 - 36.0 g/dL   RDW 22.0 25.4 - 27.0 %   Platelets 238 150 - 400 K/uL   nRBC 0.0 0.0 - 0.2 %    Comment: Performed at [facility], [address], [place], [place] [address]  Comprehensive metabolic panel     Status: Abnormal   Collection Time: 03/10/23  5:21 AM  Result Value Ref Range   Sodium 140 135 - 145 mmol/L   Potassium 3.5 3.5 - 5.1 mmol/L   Chloride 107 98 - 111 mmol/L   CO2 26 22 - 32 mmol/L   Glucose, Bld 114 (H) 70 - 99 mg/dL    Comment: Glucose reference range applies only to samples taken after fasting for at least 8 hours.   BUN 10 8 - 23 mg/dL   Creatinine, Ser 2.83 0.44 - 1.00 mg/dL   Calcium 8.5 (L) 8.9 - 10.3 mg/dL   Total Protein 5.6 (L) 6.5 - 8.1 g/dL   Albumin 3.1 (L) 3.5 - 5.0 g/dL  AST 13 (L) 15 - 41 U/L   ALT 11 0 - 44 U/L   Alkaline Phosphatase 47 38 - 126 U/L   Total Bilirubin 0.5 0.0 - 1.2 mg/dL   GFR, Estimated >16 >10 mL/min    Comment: (NOTE) Calculated using the  CKD-EPI Creatinine Equation (2021)    Anion gap 7 5 - 15    Comment: Performed at [facility], 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 EXAM: CTA ABDOMEN AND PELVIS WITHOUT AND WITH CONTRAST TECHNIQUE: Multidetector CT imaging of the abdomen and pelvis was performed using the standard protocol during bolus administration of intravenous contrast. Multiplanar reconstructed images and MIPs were obtained and reviewed to evaluate the vascular anatomy. RADIATION DOSE REDUCTION: This exam was performed according to the departmental dose-optimization program which includes automated exposure control, adjustment of the mA and/or kV according to patient size and/or use of iterative reconstruction technique. CONTRAST:  OMNIPAQUE IOHEXOL 350 MG/ML SOLN COMPARISON:  CT with IV contrast 03/06/2023 and 06/01/2022. FINDINGS: VASCULAR Aorta: There are mild-to-moderate calcific plaques without aneurysm, dissection or stenosis. No findings of acute vasculitis. Celiac: Patent without evidence of aneurysm, dissection, vasculitis or significant stenosis. There are minimal nonstenosing calcific plaques just past the vessel ostium. SMA: Patent without evidence of aneurysm, dissection, vasculitis or significant stenosis. There are trace nonstenosing calcific plaques proximal to the vessel inflection. Renals: Both renal arteries are patent without evidence of aneurysm, dissection, vasculitis, fibromuscular dysplasia or significant stenosis. There are no branch occlusions. IMA: Patent without evidence of aneurysm, dissection, vasculitis or significant stenosis. Inflow: Patent without evidence of aneurysm, dissection, vasculitis or significant stenosis. There are nonstenosing calcific plaques in the common iliac and internal iliac arteries. No plaque is seen involving the  external iliac arteries. Proximal Outflow: Bilateral common femoral and visualized portions of the superficial and profunda femoral arteries are patent without evidence of aneurysm, dissection, vasculitis or significant stenosis. Veins: Patent.  No portal vein dilatation. Review of the MIP images confirms the above findings. NON-VASCULAR Lower chest: There is linear scarring or atelectasis in the lung bases and mild elevation of the right hemidiaphragm. The heart is slightly enlarged. There are three-vessel coronary calcifications. The cardiac blood pool is less dense than the myocardium on the noncontrast images consistent with anemia. Hepatobiliary: 2.2 cm hemangioma again noted in segment 4, unchanged. No mass enhancement. Gallbladder and bile ducts are unremarkable. Pancreas: Partially atrophic.  No mass enhancement. Spleen: Normal. Adrenals/Urinary Tract: There is no adrenal mass. No renal solid mass enhancement. 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 Additional subcentimeter too small to characterize bilateral cortical Bosniak 2 cysts are also again shown. No interval change. There are clustered calyceal stones up to 1 cm in the inferior pole of the right kidney. No left nephrolithiasis is seen. No obstructing stones or hydronephrosis bilaterally. The bladder is unremarkable for the degree of distention. Stomach/Bowel: There is opaque material in the stomach which would obscure a vascular blush. Additional opaque material within scattered left abdominal small bowel segments. Elsewhere no active hemorrhage into the bowel is seen. The stomach and small bowel are unremarkable. An appendix is not seen. There is diffuse colonic diverticulosis without evidence of focal diverticulitis. The sigmoid wall  is thicker than elsewhere but no more than  previously. Lymphatic: No lymphadenopathy is seen. Reproductive: Status post hysterectomy. No adnexal masses. Multiple pelvic phleboliths. Other: No abdominal wall hernia or abnormality. No abdominopelvic ascites. Musculoskeletal: Degenerative change lumbar spine, with the most advanced facet hypertrophy at L4-5 where there is grade 1 anterolisthesis. Degenerative change noted lower thoracic spine, most advanced at T9-10. IMPRESSION: 1. No active GI tract hemorrhage is seen. Opaque material in the stomach and left abdominal small bowel segments would obscure a vascular blush. 2. Aortic and coronary artery atherosclerosis. There is only a small amount of nonstenosing branch vessel atherosclerosis. 3. Anemia. 4. Diverticulosis without evidence of focal diverticulitis. 5. Nonobstructing right nephrolithiasis. 6. Stable 2.2 cm hemangioma in segment 4 of the liver. 7. Multiple bilateral renal sinus cysts, larger nearly 12 cm right upper pole Bosniak 1 cyst, and additional subcentimeter too small to characterize Bosniak 2 cysts. No interval change. 8. 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 Aortic Atherosclerosis (ICD10-I70.0). Electronically Signed   By: Almira Bar M.D.   On: 03/09/2023 06:00    Medications: I have reviewed the patient's current medications.  Assessment: Painless hematochezia, presumed diverticular bleed, appears resolved Hemoglobin has been fairly stable 9.6/8.8/9.1/10.4/9.1 Hemodynamically stable Has not required blood transfusion  Plan: Will start patient on regular diet. If there is no evidence of further bleeding, okay to discharge today afternoon. GI will sign off, please recall if needed.  Kerin Salen, MD 03/10/2023, 10:12 AM

## 2023-03-10 NOTE — TOC Transition Note (Signed)
Transition of Care [facility]) - Discharge Note   Patient Details  Name: Patricia Hoover MRN: [id number] Date of Birth: 01-Oct-1945  Transition of Care [facility]) CM/SW Contact:  Lanier Clam, RN Phone Number: 03/10/2023, 1:48 PM   Clinical Narrative:   d/c home. No needs.    Final next level of care: Home/Self Care Barriers to Discharge: No Barriers Identified   Patient Goals and CMS Choice            Discharge Placement                       Discharge Plan and Services Additional resources added to the After Visit Summary for     Discharge Planning Services: CM Consult                                 Social Drivers of Health (SDOH) Interventions SDOH Screenings   Food Insecurity: No Food Insecurity (03/09/2023)  Housing: Low Risk  (03/09/2023)  Transportation Needs: No Transportation Needs (03/09/2023)  Utilities: Not At Risk (03/09/2023)  Alcohol Screen: Low Risk  (01/08/2021)  Depression (PHQ2-9): Low Risk  (03/04/2023)  Financial Resource Strain: Low Risk  (01/08/2021)  Physical Activity: Insufficiently Active (01/08/2021)  Social Connections: Moderately Integrated (03/09/2023)  Stress: No Stress Concern Present (01/08/2021)  Tobacco Use: Medium Risk (03/09/2023)     Readmission Risk Interventions     No data to display

## 2023-03-10 NOTE — Discharge Summary (Signed)
Physician Discharge Summary  Patient: Patricia Hoover VHQ:[id number] DOB: 04-08-1945   Code Status: Full Code Admit date: 03/09/2023 Discharge date: 03/10/2023 Disposition: Home, No home health services recommended PCP: Joaquim Nam, MD  Recommendations for Outpatient Follow-up:  Follow up with PCP within 1-2 weeks Regarding general hospital follow up and preventative care Recommend CBC and blood pressure check. Hgb 9.1 on day of discharge. Antihypertensives were held due to low normal pressures in setting of anemia. May need to restart. Systolics in 116-122 range on day of dc Follow up with GI Regarding diverticulosis  Discharge Diagnoses:  Principal Problem:   Lower GI bleed Active Problems:   HTN (hypertension)   HLD (hyperlipidemia)   Hyperglycemia   Hypokalemia   Aortic atherosclerosis (HCC)   ABLA (acute blood loss anemia)  Brief Hospital Course Summary: Patricia Hoover is a 78 y.o. female with medical history significant of questionable arrhythmia, angioedema, carpal tunnel syndrome, cataracts, cataract surgery, duodenal ulcer, heart murmur, hyperlipidemia, nephrolithiasis, colon polyps, hypertension, osteoporosis, vitamin D deficiency who presents with hematochezia.   ED course: Initial vital signs were temperature 98.7 F, pulse 85, respiration 17, BP 145/126 mmHg O2 sat 100% on room air.  Lab work: Urinalysis was normal.  Fecal occult blood was positive. white count 9.7, hemoglobin 9.6 g/dL platelets 413. potassium 2.7,glucose 149 mg/dL and AST of 14 units/L, the rest of the CMP measurements were normal after calcium correction.  Imaging: CTA GI bleed with no active GI tract hemorrhage.  Opaque material in the stomach and left abdominal small bowel segments with vascular vascular flush.  Diverticulosis without diverticulitis.  Nonobstructing right nephrolithiasis.  2.2 stable hemangioma of the liver.  No interval change in multiple bilateral renal cysts.  Lumbar DDD.     The  patient received 1000 mL normal saline bolus, pantoprazole 40 mg IVP, KCl 10 mEq IVPB x 1 and KCl 40 mEq p.o. x 1.  GI was consulted and evaluated for diverticulosis. Non-operative management was recommended due to stabilized nature. Serial hemoglobins were tracked after admission and showed stability. Hgb 9.1 on day of discharge. Did not require transfusion. Had soft, non-bloody BM morning of dc. Hemodynamically stable throughout stay.  She tolerated a normal diet.   Her blood pressure medications were held during admission due to normal blood pressures and did not want to risk hypotension with the active bleed. May need to be restarted at recheck.   All other chronic conditions were treated with home medications.    Discharge Condition: Good, improved Recommended discharge diet: Regular healthy diet  Consultations: GI  Procedures/Studies: None   Discharge Instructions     Discharge patient   Complete by: As directed    Discharge disposition: 01-Home or Self Care   Discharge patient date: 03/10/2023      Allergies as of 03/10/2023       Reactions   Ace Inhibitors Swelling   Angiotensin Receptor Blockers    Would avoid, h/o swelling with ACE prev   Aspirin Other (See Comments)   GI bleed   Bisphosphonates    GI side effects   Nsaids    GI bleed   Prolia [denosumab]    GI upset        Medication List     PAUSE taking these medications    amLODipine 2.5 MG tablet Wait to take this until your doctor or other care provider tells you to start again. Commonly known as: NORVASC Take 1 tablet (2.5 mg total) by mouth daily.  indapamide 2.5 MG tablet Wait to take this until your doctor or other care provider tells you to start again. Commonly known as: LOZOL Take 1 tablet (2.5 mg total) by mouth daily.       STOP taking these medications    Potassium Citrate 15 MEQ (1620 MG) Tbcr       TAKE these medications    amoxicillin-clavulanate 875-125 MG  tablet Commonly known as: AUGMENTIN Take 1 tablet by mouth 2 (two) times daily for 5 days.   ferrous sulfate 300 (60 Fe) MG/5ML syrup Take 5 mLs (300 mg total) by mouth daily.   simvastatin 10 MG tablet Commonly known as: ZOCOR Take 1 tablet (10 mg total) by mouth every other day.   VITAMIN C GUMMIE PO Take 2 each by mouth daily.   Vitamin D3 50 MCG (2000 UT) capsule Take 2 capsules (4,000 Units total) by mouth daily.        Follow-up Information     Joaquim Nam, MD. Schedule an appointment as soon as possible for a visit in 1 week(s).   Specialty: Family Medicine Contact information: [address] [place] [address] [phone]                 Subjective   Pt reports feels well. Describes a non-bloody BM this am. Tolerating breakfast without abdominal pain or nausea.   All questions and concerns were addressed at time of discharge.  Objective  Blood pressure 122/67, pulse 83, temperature 99.1 F (37.3 C), temperature source Oral, resp. rate 16, height 4\' 9"  (1.448 m), weight 68 kg, SpO2 100%.   General: Pt is alert, awake, not in acute distress Cardiovascular: RRR, S1/S2 +, no rubs, no gallops Respiratory: CTA bilaterally, no wheezing, no rhonchi Abdominal: Soft, NT, ND, bowel sounds + Extremities: no edema, no cyanosis  The results of significant diagnostics from this hospitalization (including imaging, microbiology, ancillary and laboratory) are listed below for reference.   Imaging studies: CT ANGIO GI BLEED Result Date: 03/09/2023 CLINICAL DATA:  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 EXAM: CTA ABDOMEN AND PELVIS WITHOUT AND WITH CONTRAST TECHNIQUE: Multidetector CT imaging of the abdomen and pelvis was performed using the standard protocol during bolus administration of intravenous contrast. Multiplanar reconstructed images and MIPs were obtained and reviewed to evaluate the vascular anatomy. RADIATION DOSE REDUCTION:  This exam was performed according to the departmental dose-optimization program which includes automated exposure control, adjustment of the mA and/or kV according to patient size and/or use of iterative reconstruction technique. CONTRAST:  OMNIPAQUE IOHEXOL 350 MG/ML SOLN COMPARISON:  CT with IV contrast 03/06/2023 and 06/01/2022. FINDINGS: VASCULAR Aorta: There are mild-to-moderate calcific plaques without aneurysm, dissection or stenosis. No findings of acute vasculitis. Celiac: Patent without evidence of aneurysm, dissection, vasculitis or significant stenosis. There are minimal nonstenosing calcific plaques just past the vessel ostium. SMA: Patent without evidence of aneurysm, dissection, vasculitis or significant stenosis. There are trace nonstenosing calcific plaques proximal to the vessel inflection. Renals: Both renal arteries are patent without evidence of aneurysm, dissection, vasculitis, fibromuscular dysplasia or significant stenosis. There are no branch occlusions. IMA: Patent without evidence of aneurysm, dissection, vasculitis or significant stenosis. Inflow: Patent without evidence of aneurysm, dissection, vasculitis or significant stenosis. There are nonstenosing calcific plaques in the common iliac and internal iliac arteries. No plaque is seen involving the external iliac arteries. Proximal Outflow: Bilateral common femoral and visualized portions of the superficial and profunda femoral arteries are patent without  evidence of aneurysm, dissection, vasculitis or significant stenosis. Veins: Patent.  No portal vein dilatation. Review of the MIP images confirms the above findings. NON-VASCULAR Lower chest: There is linear scarring or atelectasis in the lung bases and mild elevation of the right hemidiaphragm. The heart is slightly enlarged. There are three-vessel coronary calcifications. The cardiac blood pool is less dense than the myocardium on the noncontrast images consistent with anemia.  Hepatobiliary: 2.2 cm hemangioma again noted in segment 4, unchanged. No mass enhancement. Gallbladder and bile ducts are unremarkable. Pancreas: Partially atrophic.  No mass enhancement. Spleen: Normal. Adrenals/Urinary Tract: There is no adrenal mass. No renal solid mass enhancement. 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 Additional subcentimeter too small to characterize bilateral cortical Bosniak 2 cysts are also again shown. No interval change. There are clustered calyceal stones up to 1 cm in the inferior pole of the right kidney. No left nephrolithiasis is seen. No obstructing stones or hydronephrosis bilaterally. The bladder is unremarkable for the degree of distention. Stomach/Bowel: There is opaque material in the stomach which would obscure a vascular blush. Additional opaque material within scattered left abdominal small bowel segments. Elsewhere no active hemorrhage into the bowel is seen. The stomach and small bowel are unremarkable. An appendix is not seen. There is diffuse colonic diverticulosis without evidence of focal diverticulitis. The sigmoid wall is thicker than elsewhere but no more than previously. Lymphatic: No lymphadenopathy is seen. Reproductive: Status post hysterectomy. No adnexal masses. Multiple pelvic phleboliths. Other: No abdominal wall hernia or abnormality. No abdominopelvic ascites. Musculoskeletal: Degenerative change lumbar spine, with the most advanced facet hypertrophy at L4-5 where there is grade 1 anterolisthesis. Degenerative change noted lower thoracic spine, most advanced at T9-10. IMPRESSION: 1. No active GI tract hemorrhage is seen. Opaque material in the stomach and left abdominal small bowel segments would obscure a vascular blush. 2. Aortic and coronary artery atherosclerosis. There is only a small  amount of nonstenosing branch vessel atherosclerosis. 3. Anemia. 4. Diverticulosis without evidence of focal diverticulitis. 5. Nonobstructing right nephrolithiasis. 6. Stable 2.2 cm hemangioma in segment 4 of the liver. 7. Multiple bilateral renal sinus cysts, larger nearly 12 cm right upper pole Bosniak 1 cyst, and additional subcentimeter too small to characterize Bosniak 2 cysts. No interval change. 8. 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 Aortic Atherosclerosis (ICD10-I70.0). Electronically Signed   By: Almira Bar M.D.   On: 03/09/2023 06:00   CT ABDOMEN PELVIS W CONTRAST Result Date: 03/06/2023 CLINICAL DATA:  Left lower quadrant abdominal pain. EXAM: CT ABDOMEN AND PELVIS WITH CONTRAST TECHNIQUE: Multidetector CT imaging of the abdomen and pelvis was performed using the standard protocol following bolus administration of intravenous contrast. RADIATION DOSE REDUCTION: This exam was performed according to the departmental dose-optimization program which includes automated exposure control, adjustment of the mA and/or kV according to patient size and/or use of iterative reconstruction technique. CONTRAST:  80mL OMNIPAQUE IOHEXOL 300 MG/ML  SOLN COMPARISON:  CT abdomen and pelvis dated 06/01/2022. FINDINGS: Lower chest: No acute abnormality. Hepatobiliary: A hemangioma in the right hepatic lobe measures 1.8 cm and is unchanged from prior exams. No gallstones, gallbladder wall thickening, or biliary dilatation. Pancreas: Unremarkable. No pancreatic ductal dilatation or surrounding inflammatory changes. Spleen: Normal in size without focal abnormality. Adrenals/Urinary Tract: Adrenal glands are unremarkable. A right renal cyst  measures 11.6 cm, similar to prior exams. Multiple parapelvic cysts are seen on both sides. Nonobstructive right renal calculi measure up to 10 mm in size. No left renal calculi. No hydronephrosis on either side. Bladder is unremarkable. Stomach/Bowel:  Stomach is within normal limits. There is diverticulosis and bowel wall thickening involving the sigmoid colon, however no significant surrounding inflammatory changes are noted. There is diverticulosis throughout the colon. The appendix appears surgically absent. No evidence of bowel obstruction. Vascular/Lymphatic: Aortic atherosclerosis. No enlarged abdominal or pelvic lymph nodes. Reproductive: Status post hysterectomy. No adnexal masses. Other: No abdominal wall hernia or abnormality. No abdominopelvic ascites. Musculoskeletal: Degenerative changes are seen in the spine. IMPRESSION: 1. Diverticulosis and bowel wall thickening involving the sigmoid colon, however no significant surrounding inflammatory changes are noted. Findings may reflect chronic or mild acute diverticulitis. 2. Nonobstructive right renal calculi. Aortic Atherosclerosis (ICD10-I70.0). Electronically Signed   By: Romona Curls M.D.   On: 03/06/2023 13:54    Labs: Basic Metabolic Panel: Recent Labs  Lab 03/06/23 0945 03/09/23 0241 03/10/23 0521  NA 140 140 140  K 3.5 2.7* 3.5  CL 103 101 107  CO2 28 32 26  GLUCOSE 140* 149* 114*  BUN 16 21 10   CREATININE 0.57 0.58 0.48  CALCIUM 9.3 8.8* 8.5*  MG  --  2.3  --   PHOS  --  3.3  --    CBC: Recent Labs  Lab 03/06/23 0945 03/09/23 0347 03/09/23 0746 03/09/23 1539 03/09/23 1926 03/10/23 0521  WBC 9.2 9.7  --   --   --  6.6  HGB 13.1 9.6* 8.8* 9.9* 10.4* 9.1*  HCT 38.1 27.9* 25.9* 29.8* 30.7* 27.7*  MCV 85.6 87.5  --   --   --  89.1  PLT 310 244  --   --   --  238   Microbiology: Results for orders placed or performed in visit on 07/30/20  Novel Coronavirus, NAA ([facility])     Status: None   Collection Time: 07/30/20  8:54 AM   Specimen: Nasopharyngeal(NP) swabs in vial transport medium   Nasopharynge  Testing  Result Value Ref Range Status   SARS-CoV-2, NAA Not Detected Not Detected Final    Comment: This nucleic acid amplification test was developed and its  performance characteristics determined by [employer]. Nucleic acid amplification tests include RT-PCR and TMA. This test has not been FDA cleared or approved. This test has been authorized by FDA under an Emergency Use Authorization (EUA). This test is only authorized for the duration of time the declaration that circumstances exist justifying the authorization of the emergency use of in vitro diagnostic tests for detection of SARS-CoV-2 virus and/or diagnosis of COVID-19 infection under section 564(b)(1) of the Act, 21 U.S.C. 161WRU-0(A) (1), unless the authorization is terminated or revoked sooner. When diagnostic testing is negative, the possibility of a false negative result should be considered in the context of a patient's recent exposures and the presence of clinical signs and symptoms consistent with COVID-19. An individual without symptoms of COVID-19 and who is not shedding SARS-CoV-2 virus wo uld expect to have a negative (not detected) result in this assay.   SARS-COV-2, NAA 2 DAY TAT     Status: None   Collection Time: 07/30/20  8:54 AM   Nasopharynge  Testing  Result Value Ref Range Status   SARS-CoV-2, NAA 2 DAY TAT Performed  Final    Time coordinating discharge: Over 30 minutes  Leeroy Bock, MD  [facility] 03/10/2023, 11:25 AM

## 2023-03-10 NOTE — Discharge Instructions (Signed)
Since you received your antibiotic while in the hospital, your last day on your antibiotic course will be tomorrow and then you can discontinue it. I have held your blood pressure medications because your blood pressures are well-controlled without them due to your blood loss and anemia.  Please do not take these until you have followed up with your primary care doctor to ensure that your blood pressures have elevated again. I have prescribed iron which will assist your body in replenishing your blood stores.  Please have your PCP check your blood counts when you follow-up with them in 1 to 2 weeks. I recommend you follow-up with your GI doctor at the earliest appointment you can book to discuss follow-up for your diverticulosis

## 2023-03-10 NOTE — Progress Notes (Signed)
AVS reviewed w/ pt who verbalized an understanding. No other questions at this time.PIV removed as noted. Daughter is bringing clothes up to pt - enroute

## 2023-03-15 ENCOUNTER — Ambulatory Visit (INDEPENDENT_AMBULATORY_CARE_PROVIDER_SITE_OTHER): Payer: Medicare Other

## 2023-03-15 VITALS — Ht <= 58 in | Wt 150.0 lb

## 2023-03-15 DIAGNOSIS — Z0000 Encounter for general adult medical examination without abnormal findings: Secondary | ICD-10-CM | POA: Diagnosis not present

## 2023-03-15 NOTE — Progress Notes (Signed)
 Subjective:   Patricia Hoover is a 78 y.o. who presents for a Medicare Wellness preventive visit.  Visit Complete: Virtual I connected with  Keyari Nierenberg on 03/15/23 by a audio enabled telemedicine application and verified that I am speaking with the correct person using two identifiers.  Patient Location: Home  Provider Location: Home Office  I discussed the limitations of evaluation and management by telemedicine. The patient expressed understanding and agreed to proceed.  Vital Signs: Because this visit was a virtual/telehealth visit, some criteria may be missing or patient reported. Any vitals not documented were not able to be obtained and vitals that have been documented are patient reported.  VideoDeclined- This patient declined Librarian, academic. Therefore the visit was completed with audio only.  AWV Questionnaire: No: Patient Medicare AWV questionnaire was not completed prior to this visit.  Cardiac Risk Factors include: advanced age (>64men, >61 women);dyslipidemia;hypertension;obesity (BMI >30kg/m2)     Objective:    Today's Vitals   03/15/23 1054  Weight: 150 lb (68 kg)  Height: 4\' 9"  (1.448 m)  PainSc: 0-No pain   Body mass index is 32.46 kg/m.     03/09/2023    8:59 PM 03/09/2023    4:38 AM 03/06/2023    9:43 AM 09/07/2022   10:30 AM 01/08/2021   11:20 AM 09/18/2020   12:26 AM 01/04/2020    9:51 AM  Advanced Directives  Does Patient Have a Medical Advance Directive?  No No No Yes No Yes  Type of Agricultural consultant;Living will  Healthcare Power of [place];Living will  Does patient want to make changes to medical advance directive?     Yes (MAU/Ambulatory/Procedural Areas - Information given)    Copy of Healthcare Power of Attorney in Chart?       No - copy requested  Would patient like information on creating a medical advance directive? No - Patient declined   No - Patient declined  No - Patient declined      Current Medications (verified) Outpatient Encounter Medications as of 03/15/2023  Medication Sig   [Paused] amLODipine (NORVASC) 2.5 MG tablet Take 1 tablet (2.5 mg total) by mouth daily.   Ascorbic Acid (VITAMIN C GUMMIE PO) Take 2 each by mouth daily.   Cholecalciferol (VITAMIN D3) 50 MCG (2000 UT) capsule Take 2 capsules (4,000 Units total) by mouth daily.   ferrous sulfate 300 (60 Fe) MG/5ML syrup Take 5 mLs (300 mg total) by mouth daily.   [Paused] indapamide (LOZOL) 2.5 MG tablet Take 1 tablet (2.5 mg total) by mouth daily.   simvastatin (ZOCOR) 10 MG tablet Take 1 tablet (10 mg total) by mouth every other day.   No facility-administered encounter medications on file as of 03/15/2023.    Allergies (verified) Ace inhibitors, Angiotensin receptor blockers, Aspirin, Bisphosphonates, Nsaids, and Prolia [denosumab]   History: Past Medical History:  Diagnosis Date   Angioedema 06/15/2017   Arrhythmia    possible hx, resolved prev   Blood transfusion without reported diagnosis 1972   had reaction; had to stop   Carpal tunnel syndrome    Cataract 2019   resolved with surgery   Duodenal ulcer    H/O exercise stress test 2012   normal   Heart murmur    as child   High cholesterol    History of kidney stones    Hx of colonic polyps    Hypertension    Osteoporosis 2010   t  score - 3.9   Peptic ulcer of duodenum    Renal stones    Vitamin D deficiency 03/01/2021   Past Surgical History:  Procedure Laterality Date   ABDOMINAL HYSTERECTOMY  1972   for endometriosis   CATARACT EXTRACTION W/ INTRAOCULAR LENS IMPLANT Right 06/2017   CATARACT EXTRACTION W/ INTRAOCULAR LENS IMPLANT Left 07/2017   COLONOSCOPY Left 03/02/2017   Procedure: COLONOSCOPY;  Surgeon: Pasty Spillers, MD;  Location: [facility] ENDOSCOPY;  Service: Endoscopy;  Laterality: Left;   COLONOSCOPY WITH PROPOFOL N/A 05/04/2016   Procedure: COLONOSCOPY WITH PROPOFOL;  Surgeon: Wyline Mood, MD;  Location: [facility]  ENDOSCOPY;  Service: Endoscopy;  Laterality: N/A;   ESOPHAGOGASTRODUODENOSCOPY Left 03/02/2017   Procedure: ESOPHAGOGASTRODUODENOSCOPY (EGD);  Surgeon: Pasty Spillers, MD;  Location: [facility] ENDOSCOPY;  Service: Endoscopy;  Laterality: Left;   ESOPHAGOGASTRODUODENOSCOPY (EGD) WITH PROPOFOL N/A 04/08/2017   Procedure: ESOPHAGOGASTRODUODENOSCOPY (EGD) WITH PROPOFOL;  Surgeon: Pasty Spillers, MD;  Location: [facility] ENDOSCOPY;  Service: Endoscopy;  Laterality: N/A;   LITHOTRIPSY     for kidney stone x5   LITHOTRIPSY  09/2017   Family History  Problem Relation Age of Onset   Heart disease Mother    Renal Disease Mother    Heart failure Mother    Colon cancer Neg Hx    Breast cancer Neg Hx    Social History   Socioeconomic History   Marital status: Widowed    Spouse name: Not on file   Number of children: Not on file   Years of education: Not on file   Highest education level: Not on file  Occupational History   Occupation: Retired    Comment: Product manager  Tobacco Use   Smoking status: Former    Current packs/day: 0.00    Types: Cigarettes    Quit date: 01/19/1991    Years since quitting: 32.1   Smokeless tobacco: Never  Vaping Use   Vaping status: Never Used  Substance and Sexual Activity   Alcohol use: No    Comment: rare wine   Drug use: No   Sexual activity: Not on file  Other Topics Concern   Not on file  Social History Narrative   Education:  1 year Business School   Retired back to [employer] after working in [employer]   Widowed '93   2 kids   Social Drivers of Corporate investment banker Strain: Low Risk  (03/15/2023)   Overall Financial Resource Strain (CARDIA)    Difficulty of Paying Living Expenses: Not hard at all  Food Insecurity: No Food Insecurity (03/15/2023)   Hunger Vital Sign    Worried About Running Out of Food in the Last Year: Never true    Ran Out of Food in the Last Year: Never true  Transportation Needs: No  Transportation Needs (03/15/2023)   PRAPARE - Administrator, Civil Service (Medical): No    Lack of Transportation (Non-Medical): No  Physical Activity: Insufficiently Active (03/15/2023)   Exercise Vital Sign    Days of Exercise per Week: 2 days    Minutes of Exercise per Session: 30 min  Stress: Stress Concern Present (03/15/2023)   [employer] of Occupational Health - Occupational Stress Questionnaire    Feeling of Stress : To some extent  Social Connections: Moderately Integrated (03/15/2023)   Social Connection and Isolation Panel [NHANES]    Frequency of Communication with Friends and Family: Twice a week    Frequency of  Social Gatherings with Friends and Family: Twice a week    Attends Religious Services: More than 4 times per year    Active Member of [employer] or Organizations: Yes    Attends Banker Meetings: 1 to 4 times per year    Marital Status: Widowed    Tobacco Counseling Counseling given: Not Answered    Clinical Intake:  Pre-visit preparation completed: Yes  Pain : No/denies pain Pain Score: 0-No pain   BMI - recorded: 32.46 Nutritional Status: BMI > 30  Obese Nutritional Risks: None Diabetes: No  How often do you need to have someone help you when you read instructions, pamphlets, or other written materials from your doctor or pharmacy?: 1 - Never  Interpreter Needed?: No  Comments: lives alone Information entered by :: B.Chante Mayson,Patricia Hoover   Activities of Daily Living     03/15/2023   11:15 AM 03/09/2023    8:50 PM  In your present state of health, do you have any difficulty performing the following activities:  Hearing? 0 1  Vision? 0 0  Difficulty concentrating or making decisions? 0 1  Walking or climbing stairs? 0   Dressing or bathing? 0   Doing errands, shopping? 0 0  Preparing Food and eating ? N   Using the Toilet? N   In the past six months, have you accidently leaked urine? N   Do you have problems with loss of  bowel control? N   Managing your Medications? N   Managing your Finances? N   Housekeeping or managing your Housekeeping? N     Patient Care Team: Joaquim Nam, MD as PCP - General (Family Medicine) Carrie Mew, OD as Consulting Physician (Optometry)  Indicate any recent Medical Services you may have received from other than [facility] providers in the past year (date may be approximate).     Assessment:   This is a routine wellness examination for Patricia Hoover.  Hearing/Vision screen Hearing Screening - Comments:: Pt says her hearing is good w/hearing aids Vision Screening - Comments:: Pt says wears glasses and vision is good [facility]   Goals Addressed             This Visit's Progress    Patient Stated   On track    03/15/23- I will maintain and continue medications as prescribed.      Patient Stated       03/15/23- I will continue to line dance 2 days a week for 1 hour.        Depression Screen     03/15/2023   11:10 AM 03/04/2023   11:47 AM 02/08/2023    9:10 AM 09/23/2022    3:41 PM 04/20/2022    9:18 AM 03/02/2022   11:10 AM 01/08/2021   11:25 AM  PHQ 2/9 Scores  PHQ - 2 Score 0 0 0 0 0 0 0  PHQ- 9 Score  0 0 3 0 0     Fall Risk     03/15/2023   11:02 AM 03/04/2023   11:46 AM 02/08/2023    9:10 AM 09/23/2022    3:41 PM 04/20/2022    9:16 AM  Fall Risk   Falls in the past year? 0 0 0 0 0  Number falls in past yr: 0 0 0 0 0  Injury with Fall? 0 0 0 0 0  Risk for fall due to : No Fall Risks No Fall Risks No Fall Risks No Fall Risks No Fall Risks  Follow up Education provided;Falls prevention discussed  Falls evaluation completed;Falls prevention discussed Falls evaluation completed Falls evaluation completed    MEDICARE RISK AT HOME:  Medicare Risk at Home Any stairs in or around the home?: Yes If so, are there any without handrails?: Yes Home free of loose throw rugs in walkways, pet beds, electrical cords, etc?: Yes Adequate lighting in your home to reduce  risk of falls?: Yes Life alert?: Yes (battery needed) Use of a cane, walker or w/c?: Yes (cane sometimes) Grab bars in the bathroom?: Yes Shower chair or bench in shower?: No Elevated toilet seat or a handicapped toilet?: No  TIMED UP AND GO:  Was the test performed?  No  Cognitive Function: 6CIT completed    01/04/2020    9:56 AM 01/03/2019   11:25 AM 12/19/2017   12:41 PM 12/13/2016    2:04 PM 12/10/2015    8:42 AM  MMSE - Mini Mental State Exam  Orientation to time 5 5 5 5 5   Orientation to Place 5 5 5 5 5   Registration 3 3 3 3 3   Attention/ Calculation 5 5 0 0 0  Recall 3 3 3 3 3   Language- name 2 objects   0 0 0  Language- repeat 1 1 1 1 1   Language- follow 3 step command   3 3 3   Language- read & follow direction   0 0 0  Write a sentence   0 0 0  Copy design   0 0 0  Total score   20 20 20         03/15/2023   11:17 AM  6CIT Screen  What Year? 0 points  What month? 0 points  What time? 0 points  Count back from 20 0 points  Months in reverse 0 points  Repeat phrase 0 points  Total Score 0 points    Immunizations Immunization History  Administered Date(s) Administered   Fluad Trivalent(High Dose 65+) 10/25/2022   Influenza,inj,Quad PF,6+ Mos 10/25/2014, 12/10/2015, 12/13/2016   Influenza-Unspecified 10/10/2013, 12/03/2017, 10/13/2018, 12/19/2019, 10/31/2020, 11/28/2021   PFIZER(Purple Top)SARS-COV-2 Vaccination 02/09/2019, 02/27/2019, 10/30/2019, 05/06/2020, 11/10/2020   Pfizer(Comirnaty)Fall Seasonal Vaccine 12 years and older 11/05/2022   Pneumococcal Conjugate-13 10/25/2014   Pneumococcal Polysaccharide-23 06/04/2013   Tdap 07/06/2011   Zoster, Live 07/06/2011    Screening Tests Health Maintenance  Topic Date Due   Zoster Vaccines- Shingrix (1 of 2) 10/13/1994   DTaP/Tdap/Td (2 - Td or Tdap) 07/05/2021   Colonoscopy  03/02/2022   Diabetic kidney evaluation - Urine ACR  03/03/2027 (Originally 10/13/1963)   Diabetic kidney evaluation - eGFR  measurement  03/09/2024   Medicare Annual Wellness (AWV)  03/14/2024   Pneumonia Vaccine 52+ Years old  Completed   INFLUENZA VACCINE  Completed   DEXA SCAN  Completed   COVID-19 Vaccine  Completed   Hepatitis C Screening  Completed   HPV VACCINES  Aged Out    Health Maintenance  Health Maintenance Due  Topic Date Due   Zoster Vaccines- Shingrix (1 of 2) 10/13/1994   DTaP/Tdap/Td (2 - Td or Tdap) 07/05/2021   Colonoscopy  03/02/2022   Health Maintenance Items Addressed: None   Additional Screening:  Vision Screening: Recommended annual ophthalmology exams for early detection of glaucoma and other disorders of the eye.  Dental Screening: Recommended annual dental exams for proper oral hygiene  Community Resource Referral / Chronic Care Management: CRR required this visit?  No   CCM required this visit?  No  Plan:     I have personally reviewed and noted the following in the patient's chart:   Medical and social history Use of alcohol, tobacco or illicit drugs  Current medications and supplements including opioid prescriptions. Patient is not currently taking opioid prescriptions. Functional ability and status Nutritional status Physical activity Advanced directives List of other physicians Hospitalizations, surgeries, and ER visits in previous 12 months Vitals Screenings to include cognitive, depression, and falls Referrals and appointments  In addition, I have reviewed and discussed with patient certain preventive protocols, quality metrics, and best practice recommendations. A written personalized care plan for preventive services as well as general preventive health recommendations were provided to patient.    Patricia Lush, Patricia Hoover   [birth year]   After Visit Summary: (MyChart) Due to this being a telephonic visit, the after visit summary with patients personalized plan was offered to patient via MyChart   Notes: Nothing significant to report at this  time.

## 2023-03-15 NOTE — Patient Instructions (Signed)
 Patricia Hoover , Thank you for taking time to come for your Medicare Wellness Visit. I appreciate your ongoing commitment to your health goals. Please review the following plan we discussed and let me know if I can assist you in the future.   Referrals/Orders/Follow-Ups/Clinician Recommendations: none  This is a list of the screening recommended for you and due dates:  Health Maintenance  Topic Date Due   Zoster (Shingles) Vaccine (1 of 2) 10/13/1994   DTaP/Tdap/Td vaccine (2 - Td or Tdap) 07/05/2021   Colon Cancer Screening  03/02/2022   Yearly kidney health urinalysis for diabetes  03/03/2027*   Yearly kidney function blood test for diabetes  03/09/2024   Medicare Annual Wellness Visit  03/14/2024   Pneumonia Vaccine  Completed   Flu Shot  Completed   DEXA scan (bone density measurement)  Completed   COVID-19 Vaccine  Completed   Hepatitis C Screening  Completed   HPV Vaccine  Aged Out  *Topic was postponed. The date shown is not the original due date.    Advanced directives: (Declined) Advance directive discussed with you today. Even though you declined this today, please call our office should you change your mind, and we can give you the proper paperwork for you to fill out.  Next Medicare Annual Wellness Visit scheduled for next year: Yes 03/15/24 @ 10:50am televisit

## 2023-03-18 ENCOUNTER — Ambulatory Visit: Payer: Medicare Other | Admitting: Family Medicine

## 2023-03-18 ENCOUNTER — Telehealth: Payer: Self-pay | Admitting: Family Medicine

## 2023-03-18 VITALS — BP 110/76 | HR 66 | Temp 98.7°F | Ht <= 58 in | Wt 148.0 lb

## 2023-03-18 DIAGNOSIS — D62 Acute posthemorrhagic anemia: Secondary | ICD-10-CM | POA: Diagnosis not present

## 2023-03-18 DIAGNOSIS — K922 Gastrointestinal hemorrhage, unspecified: Secondary | ICD-10-CM

## 2023-03-18 DIAGNOSIS — I10 Essential (primary) hypertension: Secondary | ICD-10-CM

## 2023-03-18 LAB — COMPREHENSIVE METABOLIC PANEL
ALT: 11 U/L (ref 0–35)
AST: 14 U/L (ref 0–37)
Albumin: 3.9 g/dL (ref 3.5–5.2)
Alkaline Phosphatase: 50 U/L (ref 39–117)
BUN: 14 mg/dL (ref 6–23)
CO2: 27 meq/L (ref 19–32)
Calcium: 8.7 mg/dL (ref 8.4–10.5)
Chloride: 108 meq/L (ref 96–112)
Creatinine, Ser: 0.55 mg/dL (ref 0.40–1.20)
GFR: 87.79 mL/min (ref >60.00)
Glucose, Bld: 81 mg/dL (ref 70–99)
Potassium: 3.8 meq/L (ref 3.5–5.1)
Sodium: 141 meq/L (ref 135–145)
Total Bilirubin: 0.3 mg/dL (ref 0.2–1.2)
Total Protein: 6.5 g/dL (ref 6.0–8.3)

## 2023-03-18 LAB — CBC WITH DIFFERENTIAL/PLATELET
Basophils Absolute: 0.1 10*3/uL (ref 0.0–0.1)
Basophils Relative: 0.9 % (ref 0.0–3.0)
Eosinophils Absolute: 0.4 10*3/uL (ref 0.0–0.7)
Eosinophils Relative: 4.8 % (ref 0.0–5.0)
HCT: 31.9 % — ABNORMAL LOW (ref 36.0–46.0)
Hemoglobin: 10.4 g/dL — ABNORMAL LOW (ref 12.0–15.0)
Lymphocytes Relative: 32.8 % (ref 12.0–46.0)
Lymphs Abs: 2.7 10*3/uL (ref 0.7–4.0)
MCHC: 32.7 g/dL (ref 30.0–36.0)
MCV: 91.2 fL (ref 78.0–100.0)
Monocytes Absolute: 0.5 10*3/uL (ref 0.1–1.0)
Monocytes Relative: 6.1 % (ref 3.0–12.0)
Neutro Abs: 4.6 10*3/uL (ref 1.4–7.7)
Neutrophils Relative %: 55.4 % (ref 43.0–77.0)
Platelets: 350 10*3/uL (ref 150.0–400.0)
RBC: 3.5 Mil/uL — ABNORMAL LOW (ref 3.87–5.11)
RDW: 15.3 % (ref 11.5–15.5)
WBC: 8.4 10*3/uL (ref 4.0–10.5)

## 2023-03-18 MED ORDER — POTASSIUM CITRATE ER 15 MEQ (1620 MG) PO TBCR: 1.0000 | EXTENDED_RELEASE_TABLET | Freq: Every day | ORAL | Status: DC

## 2023-03-18 NOTE — Progress Notes (Signed)
 Inpatient course d/w pt, GIB.    -------------------------------  Discharge Diagnoses:  Principal Problem:   Lower GI bleed Active Problems:   HTN (hypertension)   HLD (hyperlipidemia)   Hyperglycemia   Hypokalemia   Aortic atherosclerosis (HCC)   ABLA (acute blood loss anemia)   Brief Hospital Course Summary: Patricia Hoover is a 78 y.o. female with medical history significant of questionable arrhythmia, angioedema, carpal tunnel syndrome, cataracts, cataract surgery, duodenal ulcer, heart murmur, hyperlipidemia, nephrolithiasis, colon polyps, hypertension, osteoporosis, vitamin D deficiency who presents with hematochezia.   ED course: Initial vital signs were temperature 98.7 F, pulse 85, respiration 17, BP 145/126 mmHg O2 sat 100% on room air.  Lab work: Urinalysis was normal.  Fecal occult blood was positive. white count 9.7, hemoglobin 9.6 g/dL platelets 161. potassium 2.7,glucose 149 mg/dL and AST of 14 units/L, the rest of the CMP measurements were normal after calcium correction.  Imaging: CTA GI bleed with no active GI tract hemorrhage.  Opaque material in the stomach and left abdominal small bowel segments with vascular vascular flush.  Diverticulosis without diverticulitis.  Nonobstructing right nephrolithiasis.  2.2 stable hemangioma of the liver.  No interval change in multiple bilateral renal cysts.  Lumbar DDD.     The patient received 1000 mL normal saline bolus, pantoprazole 40 mg IVP, KCl 10 mEq IVPB x 1 and KCl 40 mEq p.o. x 1.   GI was consulted and evaluated for diverticulosis. Non-operative management was recommended due to stabilized nature. Serial hemoglobins were tracked after admission and showed stability. Hgb 9.1 on day of discharge. Did not require transfusion. Had soft, non-bloody BM morning of dc. Hemodynamically stable throughout stay.  She tolerated a normal diet.    Her blood pressure medications were held during admission due to normal blood pressures and did  not want to risk hypotension with the active bleed. May need to be restarted at recheck.    All other chronic conditions were treated with home medications.     -------------------------------  Not yet back on indapamide and amlodipine except she did take 1 dose amlodipine in the meantime.   No more bleeding.   No abd pain or fevers.  No aspirin or aleve now.   Black stools on iron, d/w pt. Taking metamucil prn.    Meds, vitals, and allergies reviewed.   ROS: Per HPI unless specifically indicated in ROS section   GEN: nad, alert and oriented HEENT: ncat NECK: supple w/o LA CV: rrr PULM: ctab, no inc wob ABD: soft, +bs EXT: no edema SKIN: well perfused.

## 2023-03-18 NOTE — Telephone Encounter (Signed)
 If not having pain, then can advance diet as tolerated.  Thanks.

## 2023-03-18 NOTE — Telephone Encounter (Signed)
 Patient wants to know if she needs to stay on soft diet.

## 2023-03-18 NOTE — Patient Instructions (Addendum)
 If your BP is consistently above 140/90, then restart amlodipine.    Then if still above 140/90 after at least 1 week, then add back indapamide and potassium.   Take care.  Glad to see you.  Go to the lab on the way out.   If you have mychart we'll likely use that to update you.

## 2023-03-18 NOTE — Telephone Encounter (Signed)
 Patient notified

## 2023-03-20 ENCOUNTER — Other Ambulatory Visit: Payer: Self-pay | Admitting: Family Medicine

## 2023-03-20 DIAGNOSIS — D62 Acute posthemorrhagic anemia: Secondary | ICD-10-CM

## 2023-03-20 NOTE — Assessment & Plan Note (Addendum)
 Continue iron, no known bleeding in the meantime.  See notes on labs.  At this point, okay for outpatient f/u. Routine cautions d/w pt.

## 2023-03-20 NOTE — Assessment & Plan Note (Signed)
 Discussed option.  If BP is consistently above 140/90, then restart amlodipine.    Then if still above 140/90 after at least 1 week, then add back indapamide and potassium.  Update me as needed.  See notes on labs.

## 2023-03-21 ENCOUNTER — Encounter: Payer: Medicare Other | Admitting: Physician Assistant

## 2023-03-24 NOTE — Therapy (Signed)
 OUTPATIENT PHYSICAL THERAPY LOWER EXTREMITY EVALUATION   Patient Name: Patricia Hoover MRN: [id number] DOB:06-Nov-1945, 78 y.o., female Today's Date: 03/25/2023  END OF SESSION:  PT End of Session - 03/25/23 1528     Visit Number 1    Number of Visits 16    Date for PT Re-Evaluation 05/20/23    Progress Note Due on Visit 10    PT Start Time 1100    PT Stop Time 1143    PT Time Calculation (min) 43 min    Activity Tolerance Patient tolerated treatment well;No increased pain;Patient limited by fatigue    Behavior During Therapy [facility] for tasks assessed/performed             Past Medical History:  Diagnosis Date   Angioedema 06/15/2017   Arrhythmia    possible hx, resolved prev   Blood transfusion without reported diagnosis 1972   had reaction; had to stop   Carpal tunnel syndrome    Cataract 2019   resolved with surgery   Duodenal ulcer    H/O exercise stress test 2012   normal   Heart murmur    as child   High cholesterol    History of kidney stones    Hx of colonic polyps    Hypertension    Osteoporosis 2010   t score - 3.9   Peptic ulcer of duodenum    Renal stones    Vitamin D deficiency 03/01/2021   Past Surgical History:  Procedure Laterality Date   ABDOMINAL HYSTERECTOMY  1972   for endometriosis   CATARACT EXTRACTION W/ INTRAOCULAR LENS IMPLANT Right 06/2017   CATARACT EXTRACTION W/ INTRAOCULAR LENS IMPLANT Left 07/2017   COLONOSCOPY Left 03/02/2017   Procedure: COLONOSCOPY;  Surgeon: Pasty Spillers, MD;  Location: [facility] ENDOSCOPY;  Service: Endoscopy;  Laterality: Left;   COLONOSCOPY WITH PROPOFOL N/A 05/04/2016   Procedure: COLONOSCOPY WITH PROPOFOL;  Surgeon: Wyline Mood, MD;  Location: [facility] ENDOSCOPY;  Service: Endoscopy;  Laterality: N/A;   ESOPHAGOGASTRODUODENOSCOPY Left 03/02/2017   Procedure: ESOPHAGOGASTRODUODENOSCOPY (EGD);  Surgeon: Pasty Spillers, MD;  Location[facility] ENDOSCOPY;  Service: Endoscopy;  Laterality: Left;    ESOPHAGOGASTRODUODENOSCOPY (EGD) WITH PROPOFOL N/A 04/08/2017   Procedure: ESOPHAGOGASTRODUODENOSCOPY (EGD) WITH PROPOFOL;  Surgeon: Pasty Spillers, MD;  Location: [facility] ENDOSCOPY;  Service: Endoscopy;  Laterality: N/A;   LITHOTRIPSY     for kidney stone x5   LITHOTRIPSY  09/2017   Patient Active Problem List   Diagnosis Date Noted   Lower GI bleed 03/09/2023   Hypokalemia 03/09/2023   Aortic atherosclerosis (HCC) 03/09/2023   ABLA (acute blood loss anemia) 03/09/2023   Unilateral primary osteoarthritis, left knee 11/26/2022   Dysuria 04/21/2022   Stiff-legged gait 04/21/2022   Scalp lesion 09/21/2021   Clavicle enlargement 04/05/2021   Hyperpigmentation 03/01/2021   Vitamin D deficiency 03/01/2021   Leg pain 05/21/2019   Right leg pain 05/21/2019   Healthcare maintenance 01/08/2018   Paresthesia 01/08/2018   Neck pain 08/31/2017   Angioedema 06/15/2017   Peptic ulcer of duodenum    Other specified disease of esophagus    Hematochezia    External hemorrhoids    Diverticulosis of large intestine without diverticulitis    Internal hemorrhoids    Duodenal ulceration    Advance care planning 12/23/2016   Renal stone 04/20/2016   Heartburn 12/17/2015   Medicare annual wellness visit, subsequent 06/05/2013   Hyperglycemia 06/05/2013   Knee pain 08/31/2012   Osteoporosis 08/18/2012   HTN (hypertension) 08/09/2012  HLD (hyperlipidemia) 08/09/2012    PCP: Dwana Curd. Para March, MD  REFERRING PROVIDER: Veverly Fells. Ophelia Charter, MD  REFERRING DIAG: M17.12 (ICD-10-CM) - Unilateral primary osteoarthritis, left knee M62.81 (ICD-10-CM) - Quadriceps weakness  THERAPY DIAG:  Difficulty in walking, not elsewhere classified - Plan: PT plan of care cert/re-cert  Muscle weakness (generalized) - Plan: PT plan of care cert/re-cert  Other abnormalities of gait and mobility - Plan: PT plan of care cert/re-cert  Localized edema - Plan: PT plan of care cert/re-cert  Chronic pain of left knee -  Plan: PT plan of care cert/re-cert  Rationale for Evaluation and Treatment: Rehabilitation  ONSET DATE: November 2024  SUBJECTIVE:   SUBJECTIVE STATEMENT: Patricia Hoover notes injuring her left knee in November of 2024 while doing a hip abduction/adduction exercise with her previous physical therapy.  She has to walk with a cane now and has been since the initial injury.  Her biggest concern is that she needs time after prolonged postures to move around before starting to walk.  PERTINENT HISTORY: Kidney stones, high cholesterol, hypertension, osteoporosis  PAIN:  Are you having pain? Yes: NPRS scale: 0-3/10 Pain location: Left knee Pain description: Stiffness, instability Aggravating factors: Standing after prolonged sitting or supine postures Relieving factors: Movement  PRECAUTIONS: None  RED FLAGS: None   WEIGHT BEARING RESTRICTIONS: No  FALLS:  Has patient fallen in last 6 months? No  LIVING ENVIRONMENT: Lives with: lives alone Lives in: House/apartment Stairs:  Needs a handrail, takes steps 1 at a time Has following equipment at home: Single point cane in the morning and outside the house  OCCUPATION: Retired  PLOF: Independent  PATIENT GOALS: Return to line dancing 2 x a week, get off the cane, stairs improved without a handrail  NEXT MD VISIT: 03/25/2023  OBJECTIVE:  Note: Objective measures were completed at Evaluation unless otherwise noted.  DIAGNOSTIC FINDINGS: Impression: Left knee osteoarthritis with more severe medial compartment  changes mild varus.   Standing AP both knees lateral left knee sunrise patella bilateral  radiographs are reviewed.  Patient has bilateral medial joint line  narrowing bone-on-bone worse on the left than right with subchondral  sclerosis marginal osteophytes.  Patellofemoral degenerative changes is  noted.   PATIENT SURVEYS:  Patient-specific functional scale: Standing after prolonged supine or seated postures 5/10 (Goal  7/10)  COGNITION: Overall cognitive status: Within functional limits for tasks assessed     SENSATION: [facility]  LOWER EXTREMITY ROM:  Active ROM Left/Right 03/25/2023   Hip flexion    Hip extension    Hip abduction    Hip adduction    Hip internal rotation    Hip external rotation    Knee flexion 122/131   Knee extension 0/0   Ankle dorsiflexion    Ankle plantarflexion    Ankle inversion    Ankle eversion     (Blank rows = not tested)  LOWER EXTREMITY STRENGTH:  In pounds assessed with hand-held dynamometer Left/Right 03/25/2023   Hip flexion    Hip extension    Hip abduction    Hip adduction    Hip internal rotation    Hip external rotation    Knee flexion    Knee extension 22.5/28.4 (Goal 60)   Ankle dorsiflexion    Ankle plantarflexion    Ankle inversion    Ankle eversion     (Blank rows = not tested)  GAIT: Distance walked: 100 feet Assistive device utilized: Single point cane Level of assistance: Complete Independence Comments: Has been using  a single-point cane since the initial injury in November 2024                                                                                                                                TREATMENT DATE:  03/25/2023 Supine quadriceps sets 10 x 5 seconds Seated straight leg raises 3 sets of 5 for 3 seconds  Functional Activities: Sit to stand with slow eccentrics and no upper extremity support 5 times Reviewed exam findings and day 1 home exercise program   PATIENT EDUCATION:  Education details: See above Person educated: Patient Education method: Explanation, Demonstration, Tactile cues, Verbal cues, and Handouts Education comprehension: verbalized understanding, returned demonstration, verbal cues required, tactile cues required, and needs further education  HOME EXERCISE PROGRAM: Access Code: [email or web address] URL: [email or web address] Date: 03/25/2023 Prepared by: Pauletta Browns  Exercises - Supine  Quadriceps Sets  - 3 x daily - 7 x weekly - 2 sets - 10 reps - 5 second hold - Seated Straight Leg Raise  - 3 x daily - 7 x weekly - 3 sets - 5 reps - 3 seconds hold - Sit to Stand Without Arm Support  - 3 x daily - 7 x weekly - 1 sets - 5 reps  ASSESSMENT:  CLINICAL IMPRESSION: Patient is a 78 y.o. female who was seen today for physical therapy evaluation and treatment for M17.12 (ICD-10-CM) - Unilateral primary osteoarthritis, left knee M62.81 (ICD-10-CM) - Quadriceps weakness.  Kimiya is particularly concerned with left knee stiffness, pain and instability when first standing after prolonged sitting or supine postures.  This is consistent with osteoarthritis and significant quadriceps weakness as assessed with hand-held dynamometer.  Quadriceps strengthening, balance and reaction time will be a heavy emphasis in her supervised and home rehabilitation.  Abiha's prognosis is very good to meet the below listed goals.  OBJECTIVE IMPAIRMENTS: Abnormal gait, decreased activity tolerance, decreased endurance, decreased knowledge of condition, difficulty walking, decreased strength, increased edema, impaired perceived functional ability, and pain.   ACTIVITY LIMITATIONS: standing, squatting, stairs, and locomotion level  PARTICIPATION LIMITATIONS: community activity  PERSONAL FACTORS: Kidney stones, high cholesterol, hypertension, osteoporosis are also affecting patient's functional outcome.   REHAB POTENTIAL: Good  CLINICAL DECISION MAKING: Stable/uncomplicated  EVALUATION COMPLEXITY: Low   GOALS: Goals reviewed with patient? Yes  SHORT TERM GOALS: Target date: 04/22/2023 Apurva will be independent with her day 1 home exercise program Baseline: Started 03/25/2023 Goal status: INITIAL  2.  Improve bilateral quadriceps strength to at least 35 pounds Baseline: 22.5/28.4 pounds Goal status: INITIAL   LONG TERM GOALS: Target date: 05/20/2023  Clova will rate herself 70% functional on the  patient specific functional score Baseline: 50% Goal status: INITIAL  2.  Khushboo will report left knee pain consistently 0-2/10 on the visual analog scale Baseline: 0-3/10 Goal status: INITIAL  3.  Improve left knee flexion active range of motion to 130 degrees Baseline: 122 degrees Goal status: INITIAL  4.  Improve bilateral quadriceps strength to at least 50 pounds Baseline: 22.5/28.4 pounds Goal status: INITIAL  5.  Maurice will be independent with her long-term maintenance home exercise program at discharge Baseline: Started 03/25/2023 Goal status: INITIAL   PLAN:  PT FREQUENCY: 2x/week  PT DURATION: 8 weeks  PLANNED INTERVENTIONS: 97110-Therapeutic exercises, 97530- Therapeutic activity, 97112- Neuromuscular re-education, 97535- Self Care, 44010- Manual therapy, [phone]- Gait training, Patient/Family education, Balance training, Stair training, Cryotherapy, and Moist heat  PLAN FOR NEXT SESSION: Review her current home exercise program and make progressions for quadriceps strength, balance and general strength and endurance.  Stairs will need to be addressed before discharge.   Cherlyn Cushing, PT, MPT 03/25/2023, 3:28 PM

## 2023-03-25 ENCOUNTER — Ambulatory Visit: Payer: Medicare Other | Admitting: Rehabilitative and Restorative Service Providers"

## 2023-03-25 ENCOUNTER — Encounter: Payer: Self-pay | Admitting: Rehabilitative and Restorative Service Providers"

## 2023-03-25 DIAGNOSIS — M6281 Muscle weakness (generalized): Secondary | ICD-10-CM

## 2023-03-25 DIAGNOSIS — R600 Localized edema: Secondary | ICD-10-CM | POA: Diagnosis not present

## 2023-03-25 DIAGNOSIS — R2689 Other abnormalities of gait and mobility: Secondary | ICD-10-CM

## 2023-03-25 DIAGNOSIS — M25562 Pain in left knee: Secondary | ICD-10-CM | POA: Diagnosis not present

## 2023-03-25 DIAGNOSIS — G8929 Other chronic pain: Secondary | ICD-10-CM | POA: Diagnosis not present

## 2023-03-25 DIAGNOSIS — R262 Difficulty in walking, not elsewhere classified: Secondary | ICD-10-CM

## 2023-04-04 ENCOUNTER — Telehealth: Payer: Self-pay | Admitting: Family Medicine

## 2023-04-04 ENCOUNTER — Ambulatory Visit (INDEPENDENT_AMBULATORY_CARE_PROVIDER_SITE_OTHER): Admitting: Physician Assistant

## 2023-04-04 VITALS — Ht <= 58 in | Wt 146.0 lb

## 2023-04-04 DIAGNOSIS — Z8739 Personal history of other diseases of the musculoskeletal system and connective tissue: Secondary | ICD-10-CM

## 2023-04-04 NOTE — Telephone Encounter (Signed)
 Returned call to patient and canceled the 4/4 appt

## 2023-04-04 NOTE — Progress Notes (Signed)
 Office Visit Note   Patient: Patricia Hoover           Date of Birth: [birth year]           MRN: [id number] Visit Date: 04/04/2023              Requested by: Eldred Manges, MD [address] [place],  [place] [address] PCP: Joaquim Nam, MD   Assessment & Plan: Visit Diagnoses: Osteoporosis  Plan: Hailey is a pleasant 78 year old woman who was referred from Dr. Ophelia Charter.  She is here for evaluation of possible osteoporosis and treatment.  She has not had a bone density scan but is scheduled for 1.  She has taken Fosamax in the past but had severe GI upset and was unable to tolerate it.  She has never had a fracture has no history of heart disease no cancer she does have a history of kidney stones no history of ulcers or bypass surgery.  Is a previous smoker but not currently.  She enjoys doing line dancing twice a week.  Recently she had Crowns Done for Dental Work.  Does Not Think She Had Any Family History of Fragility Fractures.  She Does Not Take Calcium or Calcium but Takes 2 Vitamin D a Day Which I Think Are 1000 International Units.  She Has Had Recent Blood Work in Both Her Calcium and Vitamin D Are Good.  Her Major Risk Factor Is That She Had a Hysterectomy When She Was in Her Mid 77s.  She Was Never Given Any Kind of Estrogen Replacement Therapy.  She Also Has Lost about 2 Inches in Height.  Would like to Optimize Conservative Things Such As Getting Enough Calcium in Her Diet and Vitamin D.  Also Getting Involved in Some Kind of Strength Training Perhaps through the [facility].  Unfortunately She Cannot Get into Get a Bone Density Scan until September but Is on a Waiting List.  Based on That She May Be a Good Candidate for [facility] Depending on the Numbers.  She Is Otherwise Very Healthy and Continues to Stay Active.  We talked about treatment evaluation of osteoporosis.  All her questions were answered.  I did give her some information regarding medications I gave her list of calcium  rich foods as well as information on vitamin D and calcium supplementation.  Spent well over 45 minutes discussing everything all of her questions for now are answered  Follow-Up Instructions: No follow-ups on file.   Orders:  No orders of the defined types were placed in this encounter.  No orders of the defined types were placed in this encounter.     Procedures: No procedures performed   Clinical Data: No additional findings.   Subjective: Chief Complaint  Patient presents with   Osteoporosis    HPI pleasant 78 year old woman comes in today for evaluation of osteoporosis.  She has not had this evaluated anytime recently.  She has no history of fragility fractures but did have a hysterectomy when she was in her 28s and did not have any type of estrogen replacement therapy.  She currently did try Fosamax 70 mg weekly however she had significant GI upset with it to the point that she had to discontinue it.  She has lost about an inch and a half in height  Review of Systems  All other systems reviewed and are negative.    Objective: Vital Signs: Ht 4\' 9"  (1.448 m)   Wt 146 lb (66.2 kg)  BMI 31.59 kg/m   Physical Exam Constitutional:      Appearance: Normal appearance.  Pulmonary:     Effort: Pulmonary effort is normal.  Skin:    General: Skin is warm and dry.  Neurological:     General: No focal deficit present.     Mental Status: She is alert and oriented to person, place, and time.  Psychiatric:        Mood and Affect: Mood normal.        Behavior: Behavior normal.       Specialty Comments:  No specialty comments available.  Imaging: No results found.   PMFS History: Patient Active Problem List   Diagnosis Date Noted   Lower GI bleed 03/09/2023   Hypokalemia 03/09/2023   Aortic atherosclerosis (HCC) 03/09/2023   ABLA (acute blood loss anemia) 03/09/2023   Unilateral primary osteoarthritis, left knee 11/26/2022   Dysuria 04/21/2022    Stiff-legged gait 04/21/2022   Scalp lesion 09/21/2021   Clavicle enlargement 04/05/2021   Hyperpigmentation 03/01/2021   Vitamin D deficiency 03/01/2021   Leg pain 05/21/2019   Right leg pain 05/21/2019   Healthcare maintenance 01/08/2018   Paresthesia 01/08/2018   Neck pain 08/31/2017   Angioedema 06/15/2017   Peptic ulcer of duodenum    Other specified disease of esophagus    Hematochezia    External hemorrhoids    Diverticulosis of large intestine without diverticulitis    Internal hemorrhoids    Duodenal ulceration    Advance care planning 12/23/2016   Renal stone 04/20/2016   Heartburn 12/17/2015   Medicare annual wellness visit, subsequent 06/05/2013   Hyperglycemia 06/05/2013   Knee pain 08/31/2012   Age-related osteoporosis without current pathological fracture 08/18/2012   HTN (hypertension) 08/09/2012   HLD (hyperlipidemia) 08/09/2012   Past Medical History:  Diagnosis Date   Angioedema 06/15/2017   Arrhythmia    possible hx, resolved prev   Blood transfusion without reported diagnosis 1972   had reaction; had to stop   Carpal tunnel syndrome    Cataract 2019   resolved with surgery   Duodenal ulcer    H/O exercise stress test 2012   normal   Heart murmur    as child   High cholesterol    History of kidney stones    Hx of colonic polyps    Hypertension    Osteoporosis 2010   t score - 3.9   Peptic ulcer of duodenum    Renal stones    Vitamin D deficiency 03/01/2021    Family History  Problem Relation Age of Onset   Heart disease Mother    Renal Disease Mother    Heart failure Mother    Colon cancer Neg Hx    Breast cancer Neg Hx     Past Surgical History:  Procedure Laterality Date   ABDOMINAL HYSTERECTOMY  1972   for endometriosis   CATARACT EXTRACTION W/ INTRAOCULAR LENS IMPLANT Right 06/2017   CATARACT EXTRACTION W/ INTRAOCULAR LENS IMPLANT Left 07/2017   COLONOSCOPY Left 03/02/2017   Procedure: COLONOSCOPY;  Surgeon: Pasty Spillers, MD;  Location: [facility] ENDOSCOPY;  Service: Endoscopy;  Laterality: Left;   COLONOSCOPY WITH PROPOFOL N/A 05/04/2016   Procedure: COLONOSCOPY WITH PROPOFOL;  Surgeon: Wyline Mood, MD;  Location: [facility] ENDOSCOPY;  Service: Endoscopy;  Laterality: N/A;   ESOPHAGOGASTRODUODENOSCOPY Left 03/02/2017   Procedure: ESOPHAGOGASTRODUODENOSCOPY (EGD);  Surgeon: Pasty Spillers, MD;  Location[facility]OSCOPY;  Service: Endoscopy;  Laterality: Left;   ESOPHAGOGASTRODUODENOSCOPY (EGD) WITH  PROPOFOL N/A 04/08/2017   Procedure: ESOPHAGOGASTRODUODENOSCOPY (EGD) WITH PROPOFOL;  Surgeon: Pasty Spillers, MD;  Location: [facility] ENDOSCOPY;  Service: Endoscopy;  Laterality: N/A;   LITHOTRIPSY     for kidney stone x5   LITHOTRIPSY  09/2017   Social History   Occupational History   Occupation: Retired    Comment: Product manager  Tobacco Use   Smoking status: Former    Current packs/day: 0.00    Types: Cigarettes    Quit date: 01/19/1991    Years since quitting: 32.2   Smokeless tobacco: Never  Vaping Use   Vaping status: Never Used  Substance and Sexual Activity   Alcohol use: No    Comment: rare wine   Drug use: No   Sexual activity: Not on file

## 2023-04-04 NOTE — Telephone Encounter (Signed)
 Copied from CRM [id number]. Topic: Appointments - Appointment Info/Confirmation >> Apr 04, 2023  2:37 PM Theodis Sato wrote: Patient is requesting a phone call back from the nurse Ava to confirm that's its okay for the patient to cancel 1 of her to lab appointments that are scheduled.

## 2023-04-12 ENCOUNTER — Ambulatory Visit (INDEPENDENT_AMBULATORY_CARE_PROVIDER_SITE_OTHER): Payer: Medicare Other | Admitting: Orthopaedic Surgery

## 2023-04-12 ENCOUNTER — Encounter: Payer: Self-pay | Admitting: Orthopaedic Surgery

## 2023-04-12 VITALS — BP 135/75 | HR 80

## 2023-04-12 DIAGNOSIS — M1712 Unilateral primary osteoarthritis, left knee: Secondary | ICD-10-CM

## 2023-04-12 NOTE — Progress Notes (Signed)
 Office Visit Note   Patient: Patricia Hoover           Date of Birth: [birth year]           MRN: [id number] Visit Date: 04/12/2023              Requested by: Joaquim Nam, MD [address] [place],  [place] [address] PCP: Joaquim Nam, MD   Assessment & Plan: Visit Diagnoses:  1. Unilateral primary osteoarthritis, left knee     Plan: Patient to see Dr. August Saucer in a few weeks to discuss left total knee arthroplasty.  Follow-Up Instructions: No follow-ups on file.   Orders:  No orders of the defined types were placed in this encounter.  No orders of the defined types were placed in this encounter.     Procedures: No procedures performed   Clinical Data: No additional findings.   Subjective: Chief Complaint  Patient presents with   Left Knee - Pain, Follow-up    HPI 78 year old female returns with left knee osteoarthritis ambulating with a stiff knee.  She has bone-on-bone medial compartment.  Therapy with quad strengthening have been ordered but only had 1 visit and there was problems getting her rescheduled and they were supposed to call her she states no one is called her yet.  Standing x-rays show medial compartment bone-on-bone changes.  She is ambulating with a cane.  She had endometriosis and had a complete hysterectomy with ovaries removed age 45 and has osteoporosis.  She has a new bone density already ordered in October.  She is now on calcium and vitamin D previously just vitamin D.  She may need to be on a biphosphonate.  Patient had delayed total knee arthroplasty but now states she is ready to proceed.  I discussed with her I am retiring next week and will set her up to see Dr. August Saucer in a couple weeks.  She also will go upstairs today and she can schedule a couple more therapy visits at least so she can go over exercises that she would need after total knee arthroplasty as well.  Review of Systems positive for history of iron deficiency anemia.  Was removed  age 60 with osteoporosis.  Bilateral knee osteoarthritis worse in the left knee than right.   Objective: Vital Signs: BP 135/75   Pulse 80   Physical Exam Constitutional:      Appearance: She is well-developed.  HENT:     Head: Normocephalic.     Right Ear: External ear normal.     Left Ear: External ear normal. There is no impacted cerumen.  Eyes:     Pupils: Pupils are equal, round, and reactive to light.  Neck:     Thyroid: No thyromegaly.     Trachea: No tracheal deviation.  Cardiovascular:     Rate and Rhythm: Normal rate.  Pulmonary:     Effort: Pulmonary effort is normal.  Abdominal:     Palpations: Abdomen is soft.  Musculoskeletal:     Cervical back: No rigidity.  Skin:    General: Skin is warm and dry.  Neurological:     Mental Status: She is alert and oriented to person, place, and time.  Psychiatric:        Behavior: Behavior normal.     Ortho Exam patient with significant difficulty getting from sitting standing she can ambulate without the cane but walks with her knee locked in straight position.  Medial crepitus medial joint line  tenderness 2+ knee effusion.  Distal pulses are intact negative logroll to her hips.  Specialty Comments:  No specialty comments available.  Imaging: No results found.   PMFS History: Patient Active Problem List   Diagnosis Date Noted   Lower GI bleed 03/09/2023   Hypokalemia 03/09/2023   Aortic atherosclerosis (HCC) 03/09/2023   ABLA (acute blood loss anemia) 03/09/2023   Unilateral primary osteoarthritis, left knee 11/26/2022   Dysuria 04/21/2022   Stiff-legged gait 04/21/2022   Scalp lesion 09/21/2021   Clavicle enlargement 04/05/2021   Hyperpigmentation 03/01/2021   Vitamin D deficiency 03/01/2021   Leg pain 05/21/2019   Right leg pain 05/21/2019   Healthcare maintenance 01/08/2018   Paresthesia 01/08/2018   Neck pain 08/31/2017   Angioedema 06/15/2017   Peptic ulcer of duodenum    Other specified disease  of esophagus    Hematochezia    External hemorrhoids    Diverticulosis of large intestine without diverticulitis    Internal hemorrhoids    Duodenal ulceration    Advance care planning 12/23/2016   Renal stone 04/20/2016   Heartburn 12/17/2015   Medicare annual wellness visit, subsequent 06/05/2013   Hyperglycemia 06/05/2013   Knee pain 08/31/2012   Age-related osteoporosis without current pathological fracture 08/18/2012   HTN (hypertension) 08/09/2012   HLD (hyperlipidemia) 08/09/2012   Past Medical History:  Diagnosis Date   Angioedema 06/15/2017   Arrhythmia    possible hx, resolved prev   Blood transfusion without reported diagnosis 1972   had reaction; had to stop   Carpal tunnel syndrome    Cataract 2019   resolved with surgery   Duodenal ulcer    H/O exercise stress test 2012   normal   Heart murmur    as child   High cholesterol    History of kidney stones    Hx of colonic polyps    Hypertension    Osteoporosis 2010   t score - 3.9   Peptic ulcer of duodenum    Renal stones    Vitamin D deficiency 03/01/2021    Family History  Problem Relation Age of Onset   Heart disease Mother    Renal Disease Mother    Heart failure Mother    Colon cancer Neg Hx    Breast cancer Neg Hx     Past Surgical History:  Procedure Laterality Date   ABDOMINAL HYSTERECTOMY  1972   for endometriosis   CATARACT EXTRACTION W/ INTRAOCULAR LENS IMPLANT Right 06/2017   CATARACT EXTRACTION W/ INTRAOCULAR LENS IMPLANT Left 07/2017   COLONOSCOPY Left 03/02/2017   Procedure: COLONOSCOPY;  Surgeon: Pasty Spillers, MD;  Location: [facility] ENDOSCOPY;  Service: Endoscopy;  Laterality: Left;   COLONOSCOPY WITH PROPOFOL N/A 05/04/2016   Procedure: COLONOSCOPY WITH PROPOFOL;  Surgeon: Wyline Mood, MD;  Location: [facility] ENDOSCOPY;  Service: Endoscopy;  Laterality: N/A;   ESOPHAGOGASTRODUODENOSCOPY Left 03/02/2017   Procedure: ESOPHAGOGASTRODUODENOSCOPY (EGD);  Surgeon: Pasty Spillers,  MD;  Location: [facility] ENDOSCOPY;  Service: Endoscopy;  Laterality: Left;   ESOPHAGOGASTRODUODENOSCOPY (EGD) WITH PROPOFOL N/A 04/08/2017   Procedure: ESOPHAGOGASTRODUODENOSCOPY (EGD) WITH PROPOFOL;  Surgeon: Pasty Spillers, MD;  Location: [facility] ENDOSCOPY;  Service: Endoscopy;  Laterality: N/A;   LITHOTRIPSY     for kidney stone x5   LITHOTRIPSY  09/2017   Social History   Occupational History   Occupation: Retired    Comment: Product manager  Tobacco Use   Smoking status: Former    Current packs/day: 0.00    Types:  Cigarettes    Quit date: 01/19/1991    Years since quitting: 32.2   Smokeless tobacco: Never  Vaping Use   Vaping status: Never Used  Substance and Sexual Activity   Alcohol use: No    Comment: rare wine   Drug use: No   Sexual activity: Not on file

## 2023-04-13 ENCOUNTER — Encounter: Admitting: Physical Therapy

## 2023-04-22 ENCOUNTER — Other Ambulatory Visit

## 2023-04-26 ENCOUNTER — Encounter: Admitting: Physical Therapy

## 2023-04-28 ENCOUNTER — Other Ambulatory Visit: Payer: Self-pay

## 2023-04-28 ENCOUNTER — Ambulatory Visit (INDEPENDENT_AMBULATORY_CARE_PROVIDER_SITE_OTHER): Admitting: Family Medicine

## 2023-04-28 ENCOUNTER — Other Ambulatory Visit (INDEPENDENT_AMBULATORY_CARE_PROVIDER_SITE_OTHER)

## 2023-04-28 ENCOUNTER — Encounter: Payer: Self-pay | Admitting: Family Medicine

## 2023-04-28 VITALS — BP 126/78 | HR 71 | Temp 99.3°F | Ht <= 58 in | Wt 149.2 lb

## 2023-04-28 DIAGNOSIS — D62 Acute posthemorrhagic anemia: Secondary | ICD-10-CM

## 2023-04-28 DIAGNOSIS — M25569 Pain in unspecified knee: Secondary | ICD-10-CM

## 2023-04-28 DIAGNOSIS — G8929 Other chronic pain: Secondary | ICD-10-CM | POA: Diagnosis not present

## 2023-04-28 LAB — CBC WITH DIFFERENTIAL/PLATELET
Basophils Absolute: 0.1 10*3/uL (ref 0.0–0.1)
Basophils Relative: 0.8 % (ref 0.0–3.0)
Eosinophils Absolute: 0.1 10*3/uL (ref 0.0–0.7)
Eosinophils Relative: 1.5 % (ref 0.0–5.0)
HCT: 38 % (ref 36.0–46.0)
Hemoglobin: 12.8 g/dL (ref 12.0–15.0)
Lymphocytes Relative: 27.7 % (ref 12.0–46.0)
Lymphs Abs: 2.3 10*3/uL (ref 0.7–4.0)
MCHC: 33.7 g/dL (ref 30.0–36.0)
MCV: 87.4 fl (ref 78.0–100.0)
Monocytes Absolute: 0.5 10*3/uL (ref 0.1–1.0)
Monocytes Relative: 6 % (ref 3.0–12.0)
Neutro Abs: 5.2 10*3/uL (ref 1.4–7.7)
Neutrophils Relative %: 64 % (ref 43.0–77.0)
Platelets: 279 10*3/uL (ref 150.0–400.0)
RBC: 4.35 Mil/uL (ref 3.87–5.11)
RDW: 13.9 % (ref 11.5–15.5)
WBC: 8.1 10*3/uL (ref 4.0–10.5)

## 2023-04-28 LAB — FERRITIN: Ferritin: 10.2 ng/mL (ref 10.0–291.0)

## 2023-04-28 NOTE — Patient Instructions (Addendum)
 We'll update you about your labs.  We need to make sure your blood counts are fine before you have any elective surgery.  Please see what Dr. August Saucer can offer.   Take care.  Glad to see you.

## 2023-04-28 NOTE — Progress Notes (Signed)
 No more bleeding in the meantime.  She is off iron in the meantime.  Recheck labs pending.    D/w pt about knee pain.  L knee pain.  Walking with cane at baseline. She had prev injection.  She is going to therapy in the meantime.  She was asking about stem cell tx, d/w pt. discussed that I trust the input from orthopedics and if they thought that replacement was the most appropriate way to go, I would defer to them with that (i.e. not pursue stem cell treatment).   Meds, vitals, and allergies reviewed.   ROS: Per HPI unless specifically indicated in ROS section   Nad Ncat Neck supple, no LA Rrr Ctab Abd soft, not ttp Anterior R knee pain upon standing.

## 2023-05-01 ENCOUNTER — Encounter: Payer: Self-pay | Admitting: Family Medicine

## 2023-05-01 NOTE — Assessment & Plan Note (Signed)
History of.  Recheck labs pending.  See notes on labs.

## 2023-05-01 NOTE — Assessment & Plan Note (Signed)
 We talked about making sure she was appropriately low risk for surgery.  I think it makes sense to recheck her blood counts and then have her follow-up with orthopedics to see if she is a surgical candidate from their standpoint.  She agrees with plan.

## 2023-05-02 DIAGNOSIS — N200 Calculus of kidney: Secondary | ICD-10-CM | POA: Diagnosis not present

## 2023-05-03 ENCOUNTER — Encounter: Admitting: Rehabilitative and Restorative Service Providers"

## 2023-05-04 ENCOUNTER — Ambulatory Visit: Admitting: Rehabilitative and Restorative Service Providers"

## 2023-05-04 ENCOUNTER — Encounter: Payer: Self-pay | Admitting: Rehabilitative and Restorative Service Providers"

## 2023-05-04 DIAGNOSIS — G8929 Other chronic pain: Secondary | ICD-10-CM

## 2023-05-04 DIAGNOSIS — R2689 Other abnormalities of gait and mobility: Secondary | ICD-10-CM | POA: Diagnosis not present

## 2023-05-04 DIAGNOSIS — R262 Difficulty in walking, not elsewhere classified: Secondary | ICD-10-CM | POA: Diagnosis not present

## 2023-05-04 DIAGNOSIS — M25562 Pain in left knee: Secondary | ICD-10-CM | POA: Diagnosis not present

## 2023-05-04 DIAGNOSIS — R600 Localized edema: Secondary | ICD-10-CM

## 2023-05-04 DIAGNOSIS — M6281 Muscle weakness (generalized): Secondary | ICD-10-CM

## 2023-05-04 NOTE — Therapy (Signed)
 OUTPATIENT PHYSICAL THERAPY LOWER EXTREMITY TREATMENT/PROGRESS NOTE  Progress Note Reporting Period 03/25/2023 to 05/04/2023  See note below for Objective Data and Assessment of Progress/Goals.     Patient Name: Ronda Rajkumar MRN: [id number] DOB:[birth year], 78 y.o., female Today's Date: 05/04/2023  END OF SESSION:  PT End of Session - 05/04/23 1609     Visit Number 2    Number of Visits 16    Date for PT Re-Evaluation 06/29/23    Progress Note Due on Visit 10    PT Start Time 1607    PT Stop Time 1653    PT Time Calculation (min) 46 min    Activity Tolerance Patient tolerated treatment well;No increased pain;Patient limited by fatigue    Behavior During Therapy [facility] for tasks assessed/performed              Past Medical History:  Diagnosis Date   Angioedema 06/15/2017   Arrhythmia    possible hx, resolved prev   Blood transfusion without reported diagnosis 1972   had reaction; had to stop   Carpal tunnel syndrome    Cataract 2019   resolved with surgery   Duodenal ulcer    H/O exercise stress test 2012   normal   Heart murmur    as child   High cholesterol    History of kidney stones    Hx of colonic polyps    Hypertension    Osteoporosis 2010   t score - 3.9   Peptic ulcer of duodenum    Renal stones    Vitamin D deficiency 03/01/2021   Past Surgical History:  Procedure Laterality Date   ABDOMINAL HYSTERECTOMY  1972   for endometriosis   CATARACT EXTRACTION W/ INTRAOCULAR LENS IMPLANT Right 06/2017   CATARACT EXTRACTION W/ INTRAOCULAR LENS IMPLANT Left 07/2017   COLONOSCOPY Left 03/02/2017   Procedure: COLONOSCOPY;  Surgeon: Irby Mannan, MD;  Location: [facility] ENDOSCOPY;  Service: Endoscopy;  Laterality: Left;   COLONOSCOPY WITH PROPOFOL N/A 05/04/2016   Procedure: COLONOSCOPY WITH PROPOFOL;  Surgeon: Luke Salaam, MD;  Location: [facility] ENDOSCOPY;  Service: Endoscopy;  Laterality: N/A;   ESOPHAGOGASTRODUODENOSCOPY Left 03/02/2017   Procedure:  ESOPHAGOGASTRODUODENOSCOPY (EGD);  Surgeon: Irby Mannan, MD;  Location: [facility] ENDOSCOPY;  Service: Endoscopy;  Laterality: Left;   ESOPHAGOGASTRODUODENOSCOPY (EGD) WITH PROPOFOL N/A 04/08/2017   Procedure: ESOPHAGOGASTRODUODENOSCOPY (EGD) WITH PROPOFOL;  Surgeon: Irby Mannan, MD;  Location: [facility] ENDOSCOPY;  Service: Endoscopy;  Laterality: N/A;   LITHOTRIPSY     for kidney stone x5   LITHOTRIPSY  09/2017   Patient Active Problem List   Diagnosis Date Noted   Lower GI bleed 03/09/2023   Hypokalemia 03/09/2023   Aortic atherosclerosis (HCC) 03/09/2023   ABLA (acute blood loss anemia) 03/09/2023   Unilateral primary osteoarthritis, left knee 11/26/2022   Dysuria 04/21/2022   Stiff-legged gait 04/21/2022   Scalp lesion 09/21/2021   Clavicle enlargement 04/05/2021   Hyperpigmentation 03/01/2021   Vitamin D deficiency 03/01/2021   Leg pain 05/21/2019   Right leg pain 05/21/2019   Healthcare maintenance 01/08/2018   Paresthesia 01/08/2018   Neck pain 08/31/2017   Angioedema 06/15/2017   Peptic ulcer of duodenum    Other specified disease of esophagus    Hematochezia    External hemorrhoids    Diverticulosis of large intestine without diverticulitis    Internal hemorrhoids    Duodenal ulceration    Advance care planning 12/23/2016   Renal stone 04/20/2016   Heartburn 12/17/2015   Medicare annual  wellness visit, subsequent 06/05/2013   Hyperglycemia 06/05/2013   Knee pain 08/31/2012   Age-related osteoporosis without current pathological fracture 08/18/2012   HTN (hypertension) 08/09/2012   HLD (hyperlipidemia) 08/09/2012    PCP: Dwana Curd. Para March, MD  REFERRING PROVIDER: Veverly Fells. Ophelia Charter, MD  REFERRING DIAG: M17.12 (ICD-10-CM) - Unilateral primary osteoarthritis, left knee M62.81 (ICD-10-CM) - Quadriceps weakness  THERAPY DIAG:  Difficulty in walking, not elsewhere classified - Plan: PT plan of care cert/re-cert  Muscle weakness (generalized) - Plan: PT plan  of care cert/re-cert  Other abnormalities of gait and mobility - Plan: PT plan of care cert/re-cert  Localized edema - Plan: PT plan of care cert/re-cert  Chronic pain of left knee - Plan: PT plan of care cert/re-cert  Rationale for Evaluation and Treatment: Rehabilitation  ONSET DATE: November 2024  SUBJECTIVE:   SUBJECTIVE STATEMENT: Ahmira reports "B" home exercise program compliance.  She is able to put more weight on her left knee with less pain, although she is still severely limited with weight-bearing endurance and comfort.  Ming notes injuring her left knee in November of 2024 while doing a hip abduction/adduction exercise with her previous physical therapy.  She has to walk with a cane now and has been since the initial injury.  Her biggest concern is that she needs time after prolonged postures to move around before starting to walk.  PERTINENT HISTORY: Kidney stones, high cholesterol, hypertension, osteoporosis  PAIN:  Are you having pain? Yes: NPRS scale: 0-3/10 Pain location: Left knee Pain description: Stiffness, instability Aggravating factors: Standing after prolonged sitting or supine postures Relieving factors: Movement  PRECAUTIONS: None  RED FLAGS: None   WEIGHT BEARING RESTRICTIONS: No  FALLS:  Has patient fallen in last 6 months? No  LIVING ENVIRONMENT: Lives with: lives alone Lives in: House/apartment Stairs:  Needs a handrail, takes steps 1 at a time Has following equipment at home: Single point cane in the morning and outside the house  OCCUPATION: Retired  PLOF: Independent  PATIENT GOALS: Return to line dancing 2 x a week, get off the cane, stairs improved without a handrail  NEXT MD VISIT: 05/05/2023  OBJECTIVE:  Note: Objective measures were completed at Evaluation unless otherwise noted.  DIAGNOSTIC FINDINGS: Impression: Left knee osteoarthritis with more severe medial compartment  changes mild varus.   Standing AP both knees  lateral left knee sunrise patella bilateral  radiographs are reviewed.  Patient has bilateral medial joint line  narrowing bone-on-bone worse on the left than right with subchondral  sclerosis marginal osteophytes.  Patellofemoral degenerative changes is  noted.   PATIENT SURVEYS:  Patient-specific functional scale: Standing after prolonged supine or seated postures 5/10 (Goal 7/10)  COGNITION: Overall cognitive status: Within functional limits for tasks assessed     SENSATION: [facility]  LOWER EXTREMITY ROM:  Active ROM Left/Right 03/25/2023 Left 05/04/2023  Hip flexion    Hip extension    Hip abduction    Hip adduction    Hip internal rotation    Hip external rotation    Knee flexion 122/131 127  Knee extension 0/0 0  Ankle dorsiflexion    Ankle plantarflexion    Ankle inversion    Ankle eversion     (Blank rows = not tested)  LOWER EXTREMITY STRENGTH:  In pounds assessed with hand-held dynamometer Left/Right 03/25/2023 Left/Right 05/04/2023  Hip flexion    Hip extension    Hip abduction    Hip adduction    Hip internal rotation  Hip external rotation    Knee flexion    Knee extension 22.5/28.4 (Goal 60) 27.7/34.4  Ankle dorsiflexion    Ankle plantarflexion    Ankle inversion    Ankle eversion     (Blank rows = not tested)  GAIT: Distance walked: 100 feet Assistive device utilized: Single point cane Level of assistance: Complete Independence Comments: Has been using a single-point cane since the initial injury in November 2024                                                                                                                                TREATMENT DATE:  05/04/2023 Supine quadriceps sets 10 x 5 seconds Seated straight leg raises 3 sets of 5 for 3 seconds with 2# Knee extension machine 90-40 degrees with 10 pounds 2 sets of 10 with slow eccentric  Functional Activities: Sit to stand with slow eccentrics and no upper extremity support 5  times Modified sit to stand holding onto parallel bars and dropping her hips slowly backwards, keep hips above knees 10 times with slow eccentric  Neuromuscular re-education:  Tandem balance 5 x 20 seconds bilaterally   03/25/2023 Supine quadriceps sets 10 x 5 seconds Seated straight leg raises 3 sets of 5 for 3 seconds  Functional Activities: Sit to stand with slow eccentrics and no upper extremity support 5 times Reviewed exam findings and day 1 home exercise program   PATIENT EDUCATION:  Education details: See above Person educated: Patient Education method: Explanation, Demonstration, Tactile cues, Verbal cues, and Handouts Education comprehension: verbalized understanding, returned demonstration, verbal cues required, tactile cues required, and needs further education  HOME EXERCISE PROGRAM: Access Code: [email or web address] URL: [email or web address] Date: 05/04/2023 Prepared by: Terral Ferrari  Exercises - Supine Quadricep Sets  - 4 x daily - 7 x weekly - 2 sets - 10 reps - 5 second hold - Small Range Straight Leg Raise  - 2-3 x daily - 7 x weekly - 3-5 sets - 5 reps - 3 seconds hold - Sit to Stand Without Arm Support  - 3 x daily - 7 x weekly - 1 sets - 5 reps - Yoga Bridge  - 2 x daily - 7 x weekly - 1 sets - 10 reps - 5 seconds hold - Tandem Stance  - 1 x daily - 7 x weekly - 1 sets - 5 reps - 20 second hold  ASSESSMENT:  CLINICAL IMPRESSION: Amaani reports compliance with her early home exercises.  She is able to put more weight on the left side and do some walking in the house without an assistive device.  Kita is still very limited in her weightbearing function due to left knee pain that increases with increased weight-bearing.  Continuing the full recommended course of physical therapy should allow Alexandre to meet long-term goals established at evaluation.  Given the fact that she had a late start, I am asking for a 1 month extension  to her original plan and I  anticipate she should meet long-term goals within that timeframe.  Patient is a 78 y.o. female who was seen today for physical therapy evaluation and treatment for M17.12 (ICD-10-CM) - Unilateral primary osteoarthritis, left knee M62.81 (ICD-10-CM) - Quadriceps weakness.  Eulia is particularly concerned with left knee stiffness, pain and instability when first standing after prolonged sitting or supine postures.  This is consistent with osteoarthritis and significant quadriceps weakness as assessed with hand-held dynamometer.  Quadriceps strengthening, balance and reaction time will be a heavy emphasis in her supervised and home rehabilitation.  Alysa's prognosis is very good to meet the below listed goals.  OBJECTIVE IMPAIRMENTS: Abnormal gait, decreased activity tolerance, decreased endurance, decreased knowledge of condition, difficulty walking, decreased strength, increased edema, impaired perceived functional ability, and pain.   ACTIVITY LIMITATIONS: standing, squatting, stairs, and locomotion level  PARTICIPATION LIMITATIONS: community activity  PERSONAL FACTORS: Kidney stones, high cholesterol, hypertension, osteoporosis are also affecting patient's functional outcome.   REHAB POTENTIAL: Good  CLINICAL DECISION MAKING: Stable/uncomplicated  EVALUATION COMPLEXITY: Low   GOALS: Goals reviewed with patient? Yes  SHORT TERM GOALS: Target date: 04/22/2023 Kaysey will be independent with her day 1 home exercise program Baseline: Started 03/25/2023 Goal status: Met 05/04/2023  2.  Improve bilateral quadriceps strength to at least 35 pounds Baseline: 22.5/28.4 pounds Goal status: Ongoing 05/04/2023   LONG TERM GOALS: Target date: 06/29/2023  Netta will rate herself 70% functional on the patient specific functional score Baseline: 50% Goal status: INITIAL  2.  Aiman will report left knee pain consistently 0-2/10 on the visual analog scale Baseline: 0-3/10 Goal status: INITIAL  3.   Improve left knee flexion active range of motion to 130 degrees Baseline: 122 degrees Goal status: INITIAL  4.  Improve bilateral quadriceps strength to at least 50 pounds Baseline: 22.5/28.4 pounds Goal status: INITIAL  5.  Myeasha will be independent with her long-term maintenance home exercise program at discharge Baseline: Started 03/25/2023 Goal status: INITIAL   PLAN:  PT FREQUENCY: 2x/week  PT DURATION: 8 weeks  PLANNED INTERVENTIONS: 97110-Therapeutic exercises, 97530- Therapeutic activity, 97112- Neuromuscular re-education, 97535- Self Care, [id number]- Manual therapy, [phone]- Gait training, Patient/Family education, Balance training, Stair training, Cryotherapy, and Moist heat  PLAN FOR NEXT SESSION: Review her current home exercise program and make progressions for quadriceps strength, balance and general strength and endurance.  Stairs will need to be addressed before discharge.   Joli Neas, PT, MPT 05/04/2023, 5:05 PM

## 2023-05-05 ENCOUNTER — Encounter: Payer: Self-pay | Admitting: Orthopedic Surgery

## 2023-05-05 ENCOUNTER — Ambulatory Visit (INDEPENDENT_AMBULATORY_CARE_PROVIDER_SITE_OTHER): Admitting: Orthopedic Surgery

## 2023-05-05 ENCOUNTER — Ambulatory Visit (INDEPENDENT_AMBULATORY_CARE_PROVIDER_SITE_OTHER): Admitting: Rehabilitative and Restorative Service Providers"

## 2023-05-05 ENCOUNTER — Encounter: Payer: Self-pay | Admitting: Rehabilitative and Restorative Service Providers"

## 2023-05-05 VITALS — Ht <= 58 in | Wt 149.8 lb

## 2023-05-05 DIAGNOSIS — M25562 Pain in left knee: Secondary | ICD-10-CM | POA: Diagnosis not present

## 2023-05-05 DIAGNOSIS — M6281 Muscle weakness (generalized): Secondary | ICD-10-CM

## 2023-05-05 DIAGNOSIS — R2689 Other abnormalities of gait and mobility: Secondary | ICD-10-CM | POA: Diagnosis not present

## 2023-05-05 DIAGNOSIS — R600 Localized edema: Secondary | ICD-10-CM

## 2023-05-05 DIAGNOSIS — M1712 Unilateral primary osteoarthritis, left knee: Secondary | ICD-10-CM

## 2023-05-05 DIAGNOSIS — G8929 Other chronic pain: Secondary | ICD-10-CM

## 2023-05-05 DIAGNOSIS — R262 Difficulty in walking, not elsewhere classified: Secondary | ICD-10-CM

## 2023-05-05 NOTE — Therapy (Signed)
 OUTPATIENT PHYSICAL THERAPY LOWER EXTREMITY TREATMENT NOTE    Patient Name: Patricia Hoover MRN: [id number] DOB:[birth year], 78 y.o., female Today's Date: 05/05/2023  END OF SESSION:  PT End of Session - 05/05/23 1237     Visit Number 3    Number of Visits 16    Date for PT Re-Evaluation 06/29/23    Progress Note Due on Visit 10    PT Start Time 1148    PT Stop Time 1233    PT Time Calculation (min) 45 min    Activity Tolerance Patient tolerated treatment well;No increased pain;Patient limited by fatigue    Behavior During Therapy [facility] for tasks assessed/performed               Past Medical History:  Diagnosis Date   Angioedema 06/15/2017   Arrhythmia    possible hx, resolved prev   Blood transfusion without reported diagnosis 1972   had reaction; had to stop   Carpal tunnel syndrome    Cataract 2019   resolved with surgery   Duodenal ulcer    H/O exercise stress test 2012   normal   Heart murmur    as child   High cholesterol    History of kidney stones    Hx of colonic polyps    Hypertension    Osteoporosis 2010   t score - 3.9   Peptic ulcer of duodenum    Renal stones    Vitamin D deficiency 03/01/2021   Past Surgical History:  Procedure Laterality Date   ABDOMINAL HYSTERECTOMY  1972   for endometriosis   CATARACT EXTRACTION W/ INTRAOCULAR LENS IMPLANT Right 06/2017   CATARACT EXTRACTION W/ INTRAOCULAR LENS IMPLANT Left 07/2017   COLONOSCOPY Left 03/02/2017   Procedure: COLONOSCOPY;  Surgeon: Irby Mannan, MD;  Location: [facility] ENDOSCOPY;  Service: Endoscopy;  Laterality: Left;   COLONOSCOPY WITH PROPOFOL N/A 05/04/2016   Procedure: COLONOSCOPY WITH PROPOFOL;  Surgeon: Luke Salaam, MD;  Location: [facility] ENDOSCOPY;  Service: Endoscopy;  Laterality: N/A;   ESOPHAGOGASTRODUODENOSCOPY Left 03/02/2017   Procedure: ESOPHAGOGASTRODUODENOSCOPY (EGD);  Surgeon: Irby Mannan, MD;  Location: [facility] ENDOSCOPY;  Service: Endoscopy;  Laterality: Left;    ESOPHAGOGASTRODUODENOSCOPY (EGD) WITH PROPOFOL N/A 04/08/2017   Procedure: ESOPHAGOGASTRODUODENOSCOPY (EGD) WITH PROPOFOL;  Surgeon: Irby Mannan, MD;  Location: [facility] ENDOSCOPY;  Service: Endoscopy;  Laterality: N/A;   LITHOTRIPSY     for kidney stone x5   LITHOTRIPSY  09/2017   Patient Active Problem List   Diagnosis Date Noted   Lower GI bleed 03/09/2023   Hypokalemia 03/09/2023   Aortic atherosclerosis (HCC) 03/09/2023   ABLA (acute blood loss anemia) 03/09/2023   Unilateral primary osteoarthritis, left knee 11/26/2022   Dysuria 04/21/2022   Stiff-legged gait 04/21/2022   Scalp lesion 09/21/2021   Clavicle enlargement 04/05/2021   Hyperpigmentation 03/01/2021   Vitamin D deficiency 03/01/2021   Leg pain 05/21/2019   Right leg pain 05/21/2019   Healthcare maintenance 01/08/2018   Paresthesia 01/08/2018   Neck pain 08/31/2017   Angioedema 06/15/2017   Peptic ulcer of duodenum    Other specified disease of esophagus    Hematochezia    External hemorrhoids    Diverticulosis of large intestine without diverticulitis    Internal hemorrhoids    Duodenal ulceration    Advance care planning 12/23/2016   Renal stone 04/20/2016   Heartburn 12/17/2015   Medicare annual wellness visit, subsequent 06/05/2013   Hyperglycemia 06/05/2013   Knee pain 08/31/2012   Age-related osteoporosis without current  pathological fracture 08/18/2012   HTN (hypertension) 08/09/2012   HLD (hyperlipidemia) 08/09/2012    PCP: Dwana Curd. Para March, MD  REFERRING PROVIDER: Veverly Fells. Ophelia Charter, MD  REFERRING DIAG: M17.12 (ICD-10-CM) - Unilateral primary osteoarthritis, left knee M62.81 (ICD-10-CM) - Quadriceps weakness  THERAPY DIAG:  Difficulty in walking, not elsewhere classified  Muscle weakness (generalized)  Other abnormalities of gait and mobility  Localized edema  Chronic pain of left knee  Rationale for Evaluation and Treatment: Rehabilitation  ONSET DATE: November 2024  SUBJECTIVE:    SUBJECTIVE STATEMENT: Latera reports "soreness" from her exercises yesterday, no pain increases.  She is severely limited with weight-bearing endurance and comfort.  Opha notes injuring her left knee in November of 2024 while doing a hip abduction/adduction exercise with her previous physical therapy.  She has to walk with a cane now and has been since the initial injury.  Her biggest concern is that she needs time after prolonged postures to move around before starting to walk.  PERTINENT HISTORY: Kidney stones, high cholesterol, hypertension, osteoporosis  PAIN:  Are you having pain? Yes: NPRS scale: 0-3/10 Pain location: Left knee Pain description: Stiffness, instability Aggravating factors: Standing after prolonged sitting or supine postures Relieving factors: Movement  PRECAUTIONS: None  RED FLAGS: None   WEIGHT BEARING RESTRICTIONS: No  FALLS:  Has patient fallen in last 6 months? No  LIVING ENVIRONMENT: Lives with: lives alone Lives in: House/apartment Stairs:  Needs a handrail, takes steps 1 at a time Has following equipment at home: Single point cane in the morning and outside the house  OCCUPATION: Retired  PLOF: Independent  PATIENT GOALS: Return to line dancing 2 x a week, get off the cane, stairs improved without a handrail  NEXT MD VISIT: 05/05/2023  OBJECTIVE:  Note: Objective measures were completed at Evaluation unless otherwise noted.  DIAGNOSTIC FINDINGS: Impression: Left knee osteoarthritis with more severe medial compartment  changes mild varus.   Standing AP both knees lateral left knee sunrise patella bilateral  radiographs are reviewed.  Patient has bilateral medial joint line  narrowing bone-on-bone worse on the left than right with subchondral  sclerosis marginal osteophytes.  Patellofemoral degenerative changes is  noted.   PATIENT SURVEYS:  Patient-specific functional scale: Standing after prolonged supine or seated postures 5/10  (Goal 7/10)  COGNITION: Overall cognitive status: Within functional limits for tasks assessed     SENSATION: [facility]  LOWER EXTREMITY ROM:  Active ROM Left/Right 03/25/2023 Left 05/04/2023  Hip flexion    Hip extension    Hip abduction    Hip adduction    Hip internal rotation    Hip external rotation    Knee flexion 122/131 127  Knee extension 0/0 0  Ankle dorsiflexion    Ankle plantarflexion    Ankle inversion    Ankle eversion     (Blank rows = not tested)  LOWER EXTREMITY STRENGTH:  In pounds assessed with hand-held dynamometer Left/Right 03/25/2023 Left/Right 05/04/2023  Hip flexion    Hip extension    Hip abduction    Hip adduction    Hip internal rotation    Hip external rotation    Knee flexion    Knee extension 22.5/28.4 (Goal 60) 27.7/34.4  Ankle dorsiflexion    Ankle plantarflexion    Ankle inversion    Ankle eversion     (Blank rows = not tested)  GAIT: Distance walked: 100 feet Assistive device utilized: Single point cane Level of assistance: Complete Independence Comments: Has been using a single-point  cane since the initial injury in November 2024                                                                                                                                TREATMENT DATE:  05/05/2023 Supine quadriceps sets 10 x 5 seconds Seated straight leg raises 3 sets of 5 for 3 seconds with 2# Knee extension machine 90-40 degrees with 10 pounds 2 sets of 10 with slow eccentric  Functional Activities: Sit to stand with slow eccentrics and no upper extremity support 5 times Modified sit to stand holding onto parallel bars and dropping her hips slowly backwards, keep hips above knees 10 times with slow eccentric Ball squats with mini-bounce 2 sets of 5 (to activate quads and for sit to stand transfers)  Neuromuscular re-education:  Tandem balance 5 x 20 seconds bilaterally   05/04/2023 Supine quadriceps sets 10 x 5 seconds Seated straight leg  raises 3 sets of 5 for 3 seconds with 2# Knee extension machine 90-40 degrees with 10 pounds 2 sets of 10 with slow eccentric  Functional Activities: Sit to stand with slow eccentrics and no upper extremity support 5 times Modified sit to stand holding onto parallel bars and dropping her hips slowly backwards, keep hips above knees 10 times with slow eccentric  Neuromuscular re-education:  Tandem balance 5 x 20 seconds bilaterally   03/25/2023 Supine quadriceps sets 10 x 5 seconds Seated straight leg raises 3 sets of 5 for 3 seconds  Functional Activities: Sit to stand with slow eccentrics and no upper extremity support 5 times Reviewed exam findings and day 1 home exercise program   PATIENT EDUCATION:  Education details: See above Person educated: Patient Education method: Explanation, Demonstration, Tactile cues, Verbal cues, and Handouts Education comprehension: verbalized understanding, returned demonstration, verbal cues required, tactile cues required, and needs further education  HOME EXERCISE PROGRAM: Access Code: [email or web address] URL: [email or web address] Date: 05/04/2023 Prepared by: Pauletta Browns  Exercises - Supine Quadricep Sets  - 4 x daily - 7 x weekly - 2 sets - 10 reps - 5 second hold - Seated Straight Leg Raise  - 2-3 x daily - 7 x weekly - 3-5 sets - 5 reps - 3 seconds hold - Sit to Stand Without Arm Support  - 3 x daily - 7 x weekly - 1 sets - 5 reps - Yoga Bridge  - 2 x daily - 7 x weekly - 1 sets - 10 reps - 5 seconds hold - Tandem Stance  - 1 x daily - 7 x weekly - 1 sets - 5 reps - 20 second hold  ASSESSMENT:  CLINICAL IMPRESSION: Soul notes she is sore from yesterday's session.  She notes no pain, but that she can feel that her muscles need to be strengthened.  She is following up with Dr. August Saucer this afternoon and we will call continue to work on lower extremity strength and endurance with emphasis on  quadricep strength to decrease her knee  pain, improve her weightbearing function and allow her to ambulate and function normally without the use of an assistive device.  Glady reports compliance with her early home exercises.  She is able to put more weight on the left side and do some walking in the house without an assistive device.  Yerlin is still very limited in her weightbearing function due to left knee pain that increases with increased weight-bearing.  Continuing the full recommended course of physical therapy should allow Ambria to meet long-term goals established at evaluation.  Given the fact that she had a late start, I am asking for a 1 month extension to her original plan and I anticipate she should meet long-term goals within that timeframe.  Patient is a 77 y.o. female who was seen today for physical therapy evaluation and treatment for M17.12 (ICD-10-CM) - Unilateral primary osteoarthritis, left knee M62.81 (ICD-10-CM) - Quadriceps weakness.  Payal is particularly concerned with left knee stiffness, pain and instability when first standing after prolonged sitting or supine postures.  This is consistent with osteoarthritis and significant quadriceps weakness as assessed with hand-held dynamometer.  Quadriceps strengthening, balance and reaction time will be a heavy emphasis in her supervised and home rehabilitation.  Aulani's prognosis is very good to meet the below listed goals.  OBJECTIVE IMPAIRMENTS: Abnormal gait, decreased activity tolerance, decreased endurance, decreased knowledge of condition, difficulty walking, decreased strength, increased edema, impaired perceived functional ability, and pain.   ACTIVITY LIMITATIONS: standing, squatting, stairs, and locomotion level  PARTICIPATION LIMITATIONS: community activity  PERSONAL FACTORS: Kidney stones, high cholesterol, hypertension, osteoporosis are also affecting patient's functional outcome.   REHAB POTENTIAL: Good  CLINICAL DECISION MAKING:  Stable/uncomplicated  EVALUATION COMPLEXITY: Low   GOALS: Goals reviewed with patient? Yes  SHORT TERM GOALS: Target date: 04/22/2023 Amarie will be independent with her day 1 home exercise program Baseline: Started 03/25/2023 Goal status: Met 05/04/2023  2.  Improve bilateral quadriceps strength to at least 35 pounds Baseline: 22.5/28.4 pounds Goal status: Ongoing 05/04/2023   LONG TERM GOALS: Target date: 06/29/2023  Munachimso will rate herself 70% functional on the patient specific functional score Baseline: 50% Goal status: INITIAL  2.  Renate will report left knee pain consistently 0-2/10 on the visual analog scale Baseline: 0-3/10 Goal status: INITIAL  3.  Improve left knee flexion active range of motion to 130 degrees Baseline: 122 degrees Goal status: INITIAL  4.  Improve bilateral quadriceps strength to at least 50 pounds Baseline: 22.5/28.4 pounds Goal status: INITIAL  5.  Eilee will be independent with her long-term maintenance home exercise program at discharge Baseline: Started 03/25/2023 Goal status: INITIAL   PLAN:  PT FREQUENCY: 2x/week  PT DURATION: 8 weeks  PLANNED INTERVENTIONS: 97110-Therapeutic exercises, 97530- Therapeutic activity, 97112- Neuromuscular re-education, 97535- Self Care, 54098- Manual therapy, [phone]- Gait training, Patient/Family education, Balance training, Stair training, Cryotherapy, and Moist heat  PLAN FOR NEXT SESSION: Review her current home exercise program and make progressions for quadriceps strength, balance and general strength and endurance.  Stairs will need to be addressed before discharge.   Joli Neas, PT, MPT 05/05/2023, 3:48 PM

## 2023-05-06 ENCOUNTER — Encounter: Payer: Self-pay | Admitting: Orthopedic Surgery

## 2023-05-06 NOTE — Progress Notes (Signed)
 Office Visit Note   Patient: Patricia Hoover           Date of Birth: 19-Feb-1945           MRN: [id number] Visit Date: 05/05/2023 Requested by: Patricia Galea, MD [address] [place],  [place] [address] PCP: Patricia Galea, MD  Subjective: Chief Complaint  Patient presents with   Left Knee - Pain    HPI: Patricia Hoover is a 78 y.o. female who presents to the office reporting left knee pain.  Patient has been going to physical therapy but the patient states her pain can get fairly bad after exercising.  Hard for her to get out of bed in the morning after doing physical therapy types of exercises.  She does use a cane..  She has a history of injection.  Last injection in November did not give her much relief.  She is retired.  Has a known history of osteoporosis from early hysterectomy.  Does have a daughter at home.  16 stairs to get up to the bedroom.  No personal or family history of DVT or pulmonary embolism.  She does have a history of diverticulosis with bleeding with any anticoagulation.  Cannot take oxycodone  because of mental status issues.              ROS: All systems reviewed are negative as they relate to the chief complaint within the history of present illness.  Patient denies fevers or chills.  Assessment & Plan: Visit Diagnoses:  1. Unilateral primary osteoarthritis, left knee     Plan: Impression is left knee pain with end-stage arthritis.  Patricia Hoover is left total knee replacement.  In general she is having a lot of disability and interference with ADLs due to her left knee pain.  Does have end-stage arthritis on radiographs.  The risks and benefits of knee replacement are discussed with the patient include not limited to infection or vessel damage incomplete pain relief as well as incomplete functional restoration.  The expected rehab time and improving nature of the early rehabilitative process is discussed.  All questions answered.  No personal or family history of DVT or  pulmonary embolism but we would likely have to check ultrasound on her legs prior to discharge from the hospital to make sure she does not have DVT.  Follow-Up Instructions: No follow-ups on file.   Orders:  No orders of the defined types were placed in this encounter.  No orders of the defined types were placed in this encounter.     Procedures: No procedures performed   Clinical Data: No additional findings.  Objective: Vital Signs: Ht 4\' 9"  (1.448 m)   Wt 149 lb 12.8 oz (67.9 kg)   BMI 32.42 kg/m   Physical Exam:  Constitutional: Patient appears well-developed HEENT:  Head: Normocephalic Eyes:EOM are normal Neck: Normal range of motion Cardiovascular: Normal rate Pulmonary/chest: Effort normal Neurologic: Patient is alert Skin: Skin is warm Psychiatric: Patient has normal mood and affect  Ortho Exam: Ortho exam demonstrates mild varus alignment.  Pedal pulses palpable.  Range of motion is about 5-1 05.  No effusion.  Collateral and cruciate ligaments are stable.  Extensor mechanism intact and nontender.  No masses lymphadenopathy or skin changes noted in that knee region.  No groin pain with internal or external rotation of the leg. Specialty Comments:  No specialty comments available.  Imaging: No results found.   PMFS History: Patient Active Problem List   Diagnosis  Date Noted   Lower GI bleed 03/09/2023   Hypokalemia 03/09/2023   Aortic atherosclerosis (HCC) 03/09/2023   ABLA (acute blood loss anemia) 03/09/2023   Unilateral primary osteoarthritis, left knee 11/26/2022   Dysuria 04/21/2022   Stiff-legged gait 04/21/2022   Scalp lesion 09/21/2021   Clavicle enlargement 04/05/2021   Hyperpigmentation 03/01/2021   Vitamin D  deficiency 03/01/2021   Leg pain 05/21/2019   Right leg pain 05/21/2019   Healthcare maintenance 01/08/2018   Paresthesia 01/08/2018   Neck pain 08/31/2017   Angioedema 06/15/2017   Peptic ulcer of duodenum    Other specified  disease of esophagus    Hematochezia    External hemorrhoids    Diverticulosis of large intestine without diverticulitis    Internal hemorrhoids    Duodenal ulceration    Advance care planning 12/23/2016   Renal stone 04/20/2016   Heartburn 12/17/2015   Medicare annual wellness visit, subsequent 06/05/2013   Hyperglycemia 06/05/2013   Knee pain 08/31/2012   Age-related osteoporosis without current pathological fracture 08/18/2012   HTN (hypertension) 08/09/2012   HLD (hyperlipidemia) 08/09/2012   Past Medical History:  Diagnosis Date   Angioedema 06/15/2017   Arrhythmia    possible hx, resolved prev   Blood transfusion without reported diagnosis 1972   had reaction; had to stop   Carpal tunnel syndrome    Cataract 2019   resolved with surgery   Duodenal ulcer    H/O exercise stress test 2012   normal   Heart murmur    as child   High cholesterol    History of kidney stones    Hx of colonic polyps    Hypertension    Osteoporosis 2010   t score - 3.9   Peptic ulcer of duodenum    Renal stones    Vitamin D  deficiency 03/01/2021    Family History  Problem Relation Age of Onset   Heart disease Mother    Renal Disease Mother    Heart failure Mother    Colon cancer Neg Hx    Breast cancer Neg Hx     Past Surgical History:  Procedure Laterality Date   ABDOMINAL HYSTERECTOMY  1972   for endometriosis   CATARACT EXTRACTION W/ INTRAOCULAR LENS IMPLANT Right 06/2017   CATARACT EXTRACTION W/ INTRAOCULAR LENS IMPLANT Left 07/2017   COLONOSCOPY Left 03/02/2017   Procedure: COLONOSCOPY;  Surgeon: Patricia Mannan, MD;  Location: [facility] ENDOSCOPY;  Service: Endoscopy;  Laterality: Left;   COLONOSCOPY WITH PROPOFOL  N/A 05/04/2016   Procedure: COLONOSCOPY WITH PROPOFOL ;  Surgeon: Patricia Salaam, MD;  Location: [facility] ENDOSCOPY;  Service: Endoscopy;  Laterality: N/A;   ESOPHAGOGASTRODUODENOSCOPY Left 03/02/2017   Procedure: ESOPHAGOGASTRODUODENOSCOPY (EGD);  Surgeon: Patricia Mannan, MD;  Location[facility] ENDOSCOPY;  Service: Endoscopy;  Laterality: Left;   ESOPHAGOGASTRODUODENOSCOPY (EGD) WITH PROPOFOL  N/A 04/08/2017   Procedure: ESOPHAGOGASTRODUODENOSCOPY (EGD) WITH PROPOFOL ;  Surgeon: Patricia Mannan, MD;  Location: [facility] ENDOSCOPY;  Service: Endoscopy;  Laterality: N/A;   LITHOTRIPSY     for kidney stone x5   LITHOTRIPSY  09/2017   Social History   Occupational History   Occupation: Retired    Comment: Product manager  Tobacco Use   Smoking status: Former    Current packs/day: 0.00    Types: Cigarettes    Quit date: 01/19/1991    Years since quitting: 32.3   Smokeless tobacco: Never  Vaping Use   Vaping status: Never Used  Substance and Sexual Activity   Alcohol use: No  Comment: rare wine   Drug use: No   Sexual activity: Not on file

## 2023-05-09 ENCOUNTER — Telehealth: Payer: Self-pay | Admitting: Physical Therapy

## 2023-05-09 ENCOUNTER — Encounter: Admitting: Physical Therapy

## 2023-05-09 ENCOUNTER — Telehealth: Payer: Self-pay

## 2023-05-09 NOTE — Telephone Encounter (Signed)
 Patient would like referral to GI location changed to [facility] so that she can be seen sooner.

## 2023-05-09 NOTE — Telephone Encounter (Signed)
 Spoke with pt who missed her 1:00 PT appt today.  She was unaware of appt today, did assist with rescheduling appt for later this week.    Marley Simmers, PT, DPT 05/09/23 1:30 PM

## 2023-05-11 ENCOUNTER — Encounter: Payer: Self-pay | Admitting: Rehabilitative and Restorative Service Providers"

## 2023-05-11 ENCOUNTER — Ambulatory Visit (INDEPENDENT_AMBULATORY_CARE_PROVIDER_SITE_OTHER): Admitting: Rehabilitative and Restorative Service Providers"

## 2023-05-11 ENCOUNTER — Telehealth: Payer: Self-pay | Admitting: Orthopedic Surgery

## 2023-05-11 DIAGNOSIS — R2689 Other abnormalities of gait and mobility: Secondary | ICD-10-CM | POA: Diagnosis not present

## 2023-05-11 DIAGNOSIS — G8929 Other chronic pain: Secondary | ICD-10-CM

## 2023-05-11 DIAGNOSIS — M6281 Muscle weakness (generalized): Secondary | ICD-10-CM

## 2023-05-11 DIAGNOSIS — R262 Difficulty in walking, not elsewhere classified: Secondary | ICD-10-CM | POA: Diagnosis not present

## 2023-05-11 DIAGNOSIS — R600 Localized edema: Secondary | ICD-10-CM | POA: Diagnosis not present

## 2023-05-11 DIAGNOSIS — M25562 Pain in left knee: Secondary | ICD-10-CM | POA: Diagnosis not present

## 2023-05-11 NOTE — Telephone Encounter (Signed)
 Called patient to offer surgery dates available for left total knee arthroplasty.  Patient states she is not ready to have surgery yet and would like to see her gastrologist prior to setting a date.  Patient has name and direct number to call when ready to have surgery.

## 2023-05-11 NOTE — Therapy (Signed)
 OUTPATIENT PHYSICAL THERAPY LOWER EXTREMITY TREATMENT NOTE    Patient Name: Patricia Hoover MRN: [id number] DOB:28-Apr-1945, 78 y.o., female Today's Date: 05/11/2023  END OF SESSION:  PT End of Session - 05/11/23 1519     Visit Number 4    Number of Visits 16    Date for PT Re-Evaluation 06/29/23    Progress Note Due on Visit 10    PT Start Time 1517    PT Stop Time 1600    PT Time Calculation (min) 43 min    Activity Tolerance Patient tolerated treatment well;No increased pain    Behavior During Therapy [facility] for tasks assessed/performed                Past Medical History:  Diagnosis Date   Angioedema 06/15/2017   Arrhythmia    possible hx, resolved prev   Blood transfusion without reported diagnosis 1972   had reaction; had to stop   Carpal tunnel syndrome    Cataract 2019   resolved with surgery   Duodenal ulcer    H/O exercise stress test 2012   normal   Heart murmur    as child   High cholesterol    History of kidney stones    Hx of colonic polyps    Hypertension    Osteoporosis 2010   t score - 3.9   Peptic ulcer of duodenum    Renal stones    Vitamin D  deficiency 03/01/2021   Past Surgical History:  Procedure Laterality Date   ABDOMINAL HYSTERECTOMY  1972   for endometriosis   CATARACT EXTRACTION W/ INTRAOCULAR LENS IMPLANT Right 06/2017   CATARACT EXTRACTION W/ INTRAOCULAR LENS IMPLANT Left 07/2017   COLONOSCOPY Left 03/02/2017   Procedure: COLONOSCOPY;  Surgeon: Irby Mannan, MD;  Location: [facility] ENDOSCOPY;  Service: Endoscopy;  Laterality: Left;   COLONOSCOPY WITH PROPOFOL  N/A 05/04/2016   Procedure: COLONOSCOPY WITH PROPOFOL ;  Surgeon: Luke Salaam, MD;  Location: [facility] ENDOSCOPY;  Service: Endoscopy;  Laterality: N/A;   ESOPHAGOGASTRODUODENOSCOPY Left 03/02/2017   Procedure: ESOPHAGOGASTRODUODENOSCOPY (EGD);  Surgeon: Irby Mannan, MD;  Location: [facility] ENDOSCOPY;  Service: Endoscopy;  Laterality: Left;   ESOPHAGOGASTRODUODENOSCOPY (EGD)  WITH PROPOFOL  N/A 04/08/2017   Procedure: ESOPHAGOGASTRODUODENOSCOPY (EGD) WITH PROPOFOL ;  Surgeon: Irby Mannan, MD;  Location: [facility] ENDOSCOPY;  Service: Endoscopy;  Laterality: N/A;   LITHOTRIPSY     for kidney stone x5   LITHOTRIPSY  09/2017   Patient Active Problem List   Diagnosis Date Noted   Lower GI bleed 03/09/2023   Hypokalemia 03/09/2023   Aortic atherosclerosis (HCC) 03/09/2023   ABLA (acute blood loss anemia) 03/09/2023   Unilateral primary osteoarthritis, left knee 11/26/2022   Dysuria 04/21/2022   Stiff-legged gait 04/21/2022   Scalp lesion 09/21/2021   Clavicle enlargement 04/05/2021   Hyperpigmentation 03/01/2021   Vitamin D  deficiency 03/01/2021   Leg pain 05/21/2019   Right leg pain 05/21/2019   Healthcare maintenance 01/08/2018   Paresthesia 01/08/2018   Neck pain 08/31/2017   Angioedema 06/15/2017   Peptic ulcer of duodenum    Other specified disease of esophagus    Hematochezia    External hemorrhoids    Diverticulosis of large intestine without diverticulitis    Internal hemorrhoids    Duodenal ulceration    Advance care planning 12/23/2016   Renal stone 04/20/2016   Heartburn 12/17/2015   Medicare annual wellness visit, subsequent 06/05/2013   Hyperglycemia 06/05/2013   Knee pain 08/31/2012   Age-related osteoporosis without current pathological fracture  08/18/2012   HTN (hypertension) 08/09/2012   HLD (hyperlipidemia) 08/09/2012    PCP: Gwynda Leriche. Vallarie Gauze, MD  REFERRING PROVIDER: Myrtle Atta. Murrel Arnt, MD  REFERRING DIAG: M17.12 (ICD-10-CM) - Unilateral primary osteoarthritis, left knee M62.81 (ICD-10-CM) - Quadriceps weakness  THERAPY DIAG:  Difficulty in walking, not elsewhere classified  Muscle weakness (generalized)  Other abnormalities of gait and mobility  Localized edema  Chronic pain of left knee  Rationale for Evaluation and Treatment: Rehabilitation  ONSET DATE: November 2024  SUBJECTIVE:   SUBJECTIVE STATEMENT: Carleena  reports continued compliance with her HEP.  She reports less instability and better sit to stand after prolonged postures.    Antonia notes injuring her left knee in November of 2024 while doing a hip abduction/adduction exercise with her previous physical therapy.  She has to walk with a cane now and has been since the initial injury.  Her biggest concern is that she needs time after prolonged postures to move around before starting to walk.  PERTINENT HISTORY: Kidney stones, high cholesterol, hypertension, osteoporosis  PAIN:  Are you having pain? Yes: NPRS scale: 0-3/10 Pain location: Left knee Pain description: Stiffness, instability Aggravating factors: Standing after prolonged sitting or supine postures Relieving factors: Movement  PRECAUTIONS: None  RED FLAGS: None   WEIGHT BEARING RESTRICTIONS: No  FALLS:  Has patient fallen in last 6 months? No  LIVING ENVIRONMENT: Lives with: lives alone Lives in: House/apartment Stairs:  Needs a handrail, takes steps 1 at a time Has following equipment at home: Single point cane in the morning and outside the house  OCCUPATION: Retired  PLOF: Independent  PATIENT GOALS: Return to line dancing 2 x a week, get off the cane, stairs improved without a handrail  NEXT MD VISIT: NA  OBJECTIVE:  Note: Objective measures were completed at Evaluation unless otherwise noted.  DIAGNOSTIC FINDINGS: Impression: Left knee osteoarthritis with more severe medial compartment  changes mild varus.   Standing AP both knees lateral left knee sunrise patella bilateral  radiographs are reviewed.  Patient has bilateral medial joint line  narrowing bone-on-bone worse on the left than right with subchondral  sclerosis marginal osteophytes.  Patellofemoral degenerative changes is  noted.   PATIENT SURVEYS:  Patient-specific functional scale: Standing after prolonged supine or seated postures Eval: 5/10 (Goal 7/10)  05/11/2023 8/10 (Goal  met)  COGNITION: Overall cognitive status: Within functional limits for tasks assessed     SENSATION: [facility]  LOWER EXTREMITY ROM:  Active ROM Left/Right 03/25/2023 Left 05/04/2023  Hip flexion    Hip extension    Hip abduction    Hip adduction    Hip internal rotation    Hip external rotation    Knee flexion 122/131 127  Knee extension 0/0 0  Ankle dorsiflexion    Ankle plantarflexion    Ankle inversion    Ankle eversion     (Blank rows = not tested)  LOWER EXTREMITY STRENGTH:  In pounds assessed with hand-held dynamometer Left/Right 03/25/2023 Left/Right 05/04/2023  Hip flexion    Hip extension    Hip abduction    Hip adduction    Hip internal rotation    Hip external rotation    Knee flexion    Knee extension 22.5/28.4 (Goal 60) 27.7/34.4  Ankle dorsiflexion    Ankle plantarflexion    Ankle inversion    Ankle eversion     (Blank rows = not tested)  GAIT: Distance walked: 100 feet Assistive device utilized: Single point cane Level of assistance: Complete Independence  Comments: Has been using a single-point cane since the initial injury in November 2024                                                                                                                       TREATMENT DATE:  05/11/2023 Supine quadriceps sets 10 x 5 seconds Seated straight leg raises 3 sets of 10 for 3 seconds with 2.5# Yoga Bridge 10 x 5 seconds  Functional Activities: Sit to stand with slow eccentrics and no upper extremity support 5 times  Neuromuscular re-education:  Tandem balance 5 x 20 seconds bilaterally   05/05/2023 Supine quadriceps sets 10 x 5 seconds Seated straight leg raises 3 sets of 5 for 3 seconds with 2# Knee extension machine 90-40 degrees with 10 pounds 2 sets of 10 with slow eccentric  Functional Activities: Sit to stand with slow eccentrics and no upper extremity support 5 times Modified sit to stand holding onto parallel bars and dropping her hips slowly  backwards, keep hips above knees 10 times with slow eccentric Ball squats with mini-bounce 2 sets of 5 (to activate quads and for sit to stand transfers)  Neuromuscular re-education:  Tandem balance 5 x 20 seconds bilaterally   05/04/2023 Supine quadriceps sets 10 x 5 seconds Seated straight leg raises 3 sets of 5 for 3 seconds with 2# Knee extension machine 90-40 degrees with 10 pounds 2 sets of 10 with slow eccentric  Functional Activities: Sit to stand with slow eccentrics and no upper extremity support 5 times Modified sit to stand holding onto parallel bars and dropping her hips slowly backwards, keep hips above knees 10 times with slow eccentric  Neuromuscular re-education:  Tandem balance 5 x 20 seconds bilaterally  PATIENT EDUCATION:  Education details: See above Person educated: Patient Education method: Explanation, Demonstration, Tactile cues, Verbal cues, and Handouts Education comprehension: verbalized understanding, returned demonstration, verbal cues required, tactile cues required, and needs further education  HOME EXERCISE PROGRAM: Access Code: [email or web address] URL: [email or web address] Date: 05/11/2023 Prepared by: Terral Ferrari  Exercises - Supine Quadriceps Sets  - 2 x daily - 7 x weekly - 2 sets - 10 reps - 5 second hold - Seated Straight Leg Raise  - 2 x daily - 7 x weekly - 3 sets - 10 reps - 3 seconds hold - Sit to Stand Without Arm Support  - 2 x daily - 7 x weekly - 1 sets - 5 reps - Yoga Bridge  - 2 x daily - 7 x weekly - 1 sets - 10 reps - 5 seconds hold - Tandem Stance  - 1 x daily - 7 x weekly - 1 sets - 5 reps - 20 second hold  ASSESSMENT:  CLINICAL IMPRESSION: Staisha notes progress with standing after prolonged sitting or supine postures.  She notes crepitus and limited comfort with prolonged weight-bearing and we are addressing this with her home exercises which are focused on quadriceps strengthening.  We discussed and updated her HEP  and  will continue to work on quadriceps strength for easier transfers without pain and better weight-bearing endurance.  Patient is a 78 y.o. female who was seen today for physical therapy evaluation and treatment for M17.12 (ICD-10-CM) - Unilateral primary osteoarthritis, left knee M62.81 (ICD-10-CM) - Quadriceps weakness.  Jayd is particularly concerned with left knee stiffness, pain and instability when first standing after prolonged sitting or supine postures.  This is consistent with osteoarthritis and significant quadriceps weakness as assessed with hand-held dynamometer.  Quadriceps strengthening, balance and reaction time will be a heavy emphasis in her supervised and home rehabilitation.  Narya's prognosis is very good to meet the below listed goals.  OBJECTIVE IMPAIRMENTS: Abnormal gait, decreased activity tolerance, decreased endurance, decreased knowledge of condition, difficulty walking, decreased strength, increased edema, impaired perceived functional ability, and pain.   ACTIVITY LIMITATIONS: standing, squatting, stairs, and locomotion level  PARTICIPATION LIMITATIONS: community activity  PERSONAL FACTORS: Kidney stones, high cholesterol, hypertension, osteoporosis are also affecting patient's functional outcome.   REHAB POTENTIAL: Good  CLINICAL DECISION MAKING: Stable/uncomplicated  EVALUATION COMPLEXITY: Low   GOALS: Goals reviewed with patient? Yes  SHORT TERM GOALS: Target date: 04/22/2023 Korri will be independent with her day 1 home exercise program Baseline: Started 03/25/2023 Goal status: Met 05/04/2023  2.  Improve bilateral quadriceps strength to at least 35 pounds Baseline: 22.5/28.4 pounds Goal status: Ongoing 05/04/2023   LONG TERM GOALS: Target date: 06/29/2023  Makenley will rate herself 70% functional on the patient specific functional score Baseline: 50% Goal status: Met 05/11/2023  2.  Margene will report left knee pain consistently 0-2/10 on the visual  analog scale Baseline: 0-3/10 Goal status: On Going 05/11/2023  3.  Improve left knee flexion active range of motion to 130 degrees Baseline: 122 degrees Goal status: INITIAL  4.  Improve bilateral quadriceps strength to at least 50 pounds Baseline: 22.5/28.4 pounds Goal status: INITIAL  5.  Etheleen will be independent with her long-term maintenance home exercise program at discharge Baseline: Started 03/25/2023 Goal status: On Going 05/11/2023   PLAN:  PT FREQUENCY: 2x/week  PT DURATION: 8 weeks  PLANNED INTERVENTIONS: 97110-Therapeutic exercises, 97530- Therapeutic activity, 97112- Neuromuscular re-education, 97535- Self Care, 62130- Manual therapy, [phone]- Gait training, Patient/Family education, Balance training, Stair training, Cryotherapy, and Moist heat  PLAN FOR NEXT SESSION: Make progressions for quadriceps strength, balance and general strength and endurance while avoiding overuse or flare-ups.  Stairs will need to be addressed before discharge.   Joli Neas, PT, MPT 05/11/2023, 4:07 PM

## 2023-05-12 NOTE — Telephone Encounter (Signed)
 Referral faxed to [facility] as requested.

## 2023-05-13 ENCOUNTER — Telehealth: Payer: Self-pay | Admitting: Family Medicine

## 2023-05-13 NOTE — Telephone Encounter (Signed)
 Patient is wanting you opinion about blood thinners. She states that she was told that she may need to be on a blood thinner when she has knee surgery so that she want develop a blood clot. She is wanting to know your thoughts and opinions about that.

## 2023-05-13 NOTE — Telephone Encounter (Signed)
 Copied from CRM [id number]. Topic: Clinical - Medication Question >> May 13, 2023  1:45 PM Ary Bitter R wrote: Reason for CRM: Pt wanting to ask the nurse if she found out anything about the blood thinner medication. Please follow up with patient.

## 2023-05-15 NOTE — Telephone Encounter (Signed)
 There is a typical protocol to reduce the risk of having a clot after surgery, usually with taking a blood thinner.  This is a standard thing to do.   She has a history of GI bleed but her hemoglobin has normalized in the meantime.  She would be expected to have some blood loss with surgery.  I would not expect her blood counts to be normal right after surgery.  However we could still monitor her blood counts after surgery.  If she did not have progressive blood loss or bleeding after surgery, then it would likely make sense to use the recommended course of blood thinners.  If she will let me know when she is having surgery, then we could see about monitoring her blood counts.  Thanks.

## 2023-05-16 NOTE — Telephone Encounter (Signed)
 Patient notified and verbalized understanding.

## 2023-05-18 ENCOUNTER — Ambulatory Visit (INDEPENDENT_AMBULATORY_CARE_PROVIDER_SITE_OTHER): Admitting: Rehabilitative and Restorative Service Providers"

## 2023-05-18 ENCOUNTER — Encounter: Payer: Self-pay | Admitting: Rehabilitative and Restorative Service Providers"

## 2023-05-18 ENCOUNTER — Telehealth: Payer: Self-pay | Admitting: Orthopedic Surgery

## 2023-05-18 DIAGNOSIS — R600 Localized edema: Secondary | ICD-10-CM | POA: Diagnosis not present

## 2023-05-18 DIAGNOSIS — G8929 Other chronic pain: Secondary | ICD-10-CM

## 2023-05-18 DIAGNOSIS — R262 Difficulty in walking, not elsewhere classified: Secondary | ICD-10-CM | POA: Diagnosis not present

## 2023-05-18 DIAGNOSIS — R2689 Other abnormalities of gait and mobility: Secondary | ICD-10-CM | POA: Diagnosis not present

## 2023-05-18 DIAGNOSIS — M6281 Muscle weakness (generalized): Secondary | ICD-10-CM

## 2023-05-18 DIAGNOSIS — M25562 Pain in left knee: Secondary | ICD-10-CM | POA: Diagnosis not present

## 2023-05-18 NOTE — Telephone Encounter (Signed)
 Pt stopped by the front desk after PT today wanting to speak with Debbie concerning her knee surgery information.  Pt stated that you could call her at any time if today please wait until 1 hour due to her having to go to another appointment.

## 2023-05-18 NOTE — Therapy (Addendum)
 OUTPATIENT PHYSICAL THERAPY  TREATMENT NOTE /DISCHARGE    Patient Name: Patricia Hoover MRN: [id number] DOB:09-Mar-1945, 78 y.o., female Today's Date: 05/18/2023  END OF SESSION:  PT End of Session - 05/18/23 1324     Visit Number 5    Number of Visits 16    Date for PT Re-Evaluation 06/29/23    Progress Note Due on Visit 10    PT Start Time 1342    PT Stop Time 1421    PT Time Calculation (min) 39 min    Activity Tolerance Patient tolerated treatment well    Behavior During Therapy [facility] for tasks assessed/performed                 Past Medical History:  Diagnosis Date   Angioedema 06/15/2017   Arrhythmia    possible hx, resolved prev   Blood transfusion without reported diagnosis 1972   had reaction; had to stop   Carpal tunnel syndrome    Cataract 2019   resolved with surgery   Duodenal ulcer    H/O exercise stress test 2012   normal   Heart murmur    as child   High cholesterol    History of kidney stones    Hx of colonic polyps    Hypertension    Osteoporosis 2010   t score - 3.9   Peptic ulcer of duodenum    Renal stones    Vitamin D  deficiency 03/01/2021   Past Surgical History:  Procedure Laterality Date   ABDOMINAL HYSTERECTOMY  1972   for endometriosis   CATARACT EXTRACTION W/ INTRAOCULAR LENS IMPLANT Right 06/2017   CATARACT EXTRACTION W/ INTRAOCULAR LENS IMPLANT Left 07/2017   COLONOSCOPY Left 03/02/2017   Procedure: COLONOSCOPY;  Surgeon: Janalyn Keene NOVAK, MD;  Location: [facility] ENDOSCOPY;  Service: Endoscopy;  Laterality: Left;   COLONOSCOPY WITH PROPOFOL  N/A 05/04/2016   Procedure: COLONOSCOPY WITH PROPOFOL ;  Surgeon: Ruel Kung, MD;  Location: [facility] ENDOSCOPY;  Service: Endoscopy;  Laterality: N/A;   ESOPHAGOGASTRODUODENOSCOPY Left 03/02/2017   Procedure: ESOPHAGOGASTRODUODENOSCOPY (EGD);  Surgeon: Janalyn Keene NOVAK, MD;  Location: [facility] ENDOSCOPY;  Service: Endoscopy;  Laterality: Left;   ESOPHAGOGASTRODUODENOSCOPY (EGD) WITH PROPOFOL  N/A  04/08/2017   Procedure: ESOPHAGOGASTRODUODENOSCOPY (EGD) WITH PROPOFOL ;  Surgeon: Janalyn Keene NOVAK, MD;  Location: [facility] ENDOSCOPY;  Service: Endoscopy;  Laterality: N/A;   LITHOTRIPSY     for kidney stone x5   LITHOTRIPSY  09/2017   Patient Active Problem List   Diagnosis Date Noted   Lower GI bleed 03/09/2023   Hypokalemia 03/09/2023   Aortic atherosclerosis (HCC) 03/09/2023   ABLA (acute blood loss anemia) 03/09/2023   Unilateral primary osteoarthritis, left knee 11/26/2022   Dysuria 04/21/2022   Stiff-legged gait 04/21/2022   Scalp lesion 09/21/2021   Clavicle enlargement 04/05/2021   Hyperpigmentation 03/01/2021   Vitamin D  deficiency 03/01/2021   Leg pain 05/21/2019   Right leg pain 05/21/2019   Healthcare maintenance 01/08/2018   Paresthesia 01/08/2018   Neck pain 08/31/2017   Angioedema 06/15/2017   Peptic ulcer of duodenum    Other specified disease of esophagus    Hematochezia    External hemorrhoids    Diverticulosis of large intestine without diverticulitis    Internal hemorrhoids    Duodenal ulceration    Advance care planning 12/23/2016   Renal stone 04/20/2016   Heartburn 12/17/2015   Medicare annual wellness visit, subsequent 06/05/2013   Hyperglycemia 06/05/2013   Knee pain 08/31/2012   Age-related osteoporosis without current pathological fracture 08/18/2012  HTN (hypertension) 08/09/2012   HLD (hyperlipidemia) 08/09/2012    PCP: Arlyss RAMAN. Cleatus, MD  REFERRING PROVIDER: Addie Hamilton MD  REFERRING DIAG: [phone] (ICD-10-CM) - Unilateral primary osteoarthritis, left knee M62.81 (ICD-10-CM) - Quadriceps weakness  THERAPY DIAG:  Difficulty in walking, not elsewhere classified  Muscle weakness (generalized)  Other abnormalities of gait and mobility  Localized edema  Chronic pain of left knee  Rationale for Evaluation and Treatment: Rehabilitation  ONSET DATE: November 2024  SUBJECTIVE:   SUBJECTIVE STATEMENT: Pt indicated she has been  checking into medicine related things in prep for any specific.   Pt indicated she does know exercises.     PERTINENT HISTORY: Kidney stones, high cholesterol, hypertension, osteoporosis  PAIN:  NPRS scale: 0-3/10 Pain location: Left knee Pain description: Stiffness, instability Aggravating factors: Standing after prolonged sitting or supine postures Relieving factors: Movement  PRECAUTIONS: None  RED FLAGS: None   WEIGHT BEARING RESTRICTIONS: No  FALLS:  Has patient fallen in last 6 months? No  LIVING ENVIRONMENT: Lives with: lives alone Lives in: House/apartment Stairs: Needs a handrail, takes steps 1 at a time Has following equipment at home: Single point cane in the morning and outside the house  OCCUPATION: Retired  PLOF: Independent  PATIENT GOALS: Return to line dancing 2 x a week, get off the cane, stairs improved without a handrail  NEXT MD VISIT: NA  OBJECTIVE:  Note: Objective measures were completed at Evaluation unless otherwise noted.  DIAGNOSTIC FINDINGS: Impression: Left knee osteoarthritis with more severe medial compartment  changes mild varus.   Standing AP both knees lateral left knee sunrise patella bilateral  radiographs are reviewed.  Patient has bilateral medial joint line  narrowing bone-on-bone worse on the left than right with subchondral  sclerosis marginal osteophytes.  Patellofemoral degenerative changes is  noted.   PATIENT SURVEYS:  Patient-specific functional scale: Standing after prolonged supine or seated postures Eval: 5/10 (Goal 7/10) 05/11/2023 8/10 (Goal met)  COGNITION: 03/25/2023 Overall cognitive status: Within functional limits for tasks assessed     SENSATION: 03/25/2023 [facility]  LOWER EXTREMITY ROM:  Active ROM Left/Right 03/25/2023 Left 05/04/2023  Hip flexion    Hip extension    Hip abduction    Hip adduction    Hip internal rotation    Hip external rotation    Knee flexion 122/131 127  Knee extension 0/0 0   Ankle dorsiflexion    Ankle plantarflexion    Ankle inversion    Ankle eversion     (Blank rows = not tested)  LOWER EXTREMITY STRENGTH:  In pounds assessed with hand-held dynamometer Left/Right 03/25/2023 Left/Right 05/04/2023  Hip flexion    Hip extension    Hip abduction    Hip adduction    Hip internal rotation    Hip external rotation    Knee flexion    Knee extension 22.5/28.4 (Goal 60) 27.7/34.4  Ankle dorsiflexion    Ankle plantarflexion    Ankle inversion    Ankle eversion     (Blank rows = not tested)  GAIT: 03/25/2023 Distance walked: 100 feet Assistive device utilized: Single point cane Level of assistance: Complete Independence Comments: Has been using a single-point cane since the initial injury in November 2024  TREATMENT          DATE: 05/18/2023 Therex: Additional time spent in review and education of HEP and use of movement pre surgery to promote gains and maintain mobility/strength as able.  Supine AROM heel slide Lt leg 5 sec hold x 10 (done in quad set combo) Supine bridge 2 x 10 2-3 sec hold  Seated quad set with SLR x 10 Lt leg  Sit to stand to sit 18 inch chair s UE assist x 10  Nustep lvl 5 8 mins UE/LE.    Neuro Re-ed: Supine quad set Lt 5 sec hold x 10  Seated quad set 5 sec hold x 10 Tandem stance 30 sec x 3 bilateral in corner with instruction for home use.    TREATMENT          DATE: 05/11/2023 Supine quadriceps sets 10 x 5 seconds Seated straight leg raises 3 sets of 10 for 3 seconds with 2.5# Yoga Bridge 10 x 5 seconds  Functional Activities: Sit to stand with slow eccentrics and no upper extremity support 5 times  Neuromuscular re-education:  Tandem balance 5 x 20 seconds bilaterally   TREATMENT          DATE: 05/05/2023 Supine quadriceps sets 10 x 5 seconds Seated straight leg raises 3 sets of 5  for 3 seconds with 2# Knee extension machine 90-40 degrees with 10 pounds 2 sets of 10 with slow eccentric  Functional Activities: Sit to stand with slow eccentrics and no upper extremity support 5 times Modified sit to stand holding onto parallel bars and dropping her hips slowly backwards, keep hips above knees 10 times with slow eccentric Ball squats with mini-bounce 2 sets of 5 (to activate quads and for sit to stand transfers)  Neuromuscular re-education:  Tandem balance 5 x 20 seconds bilaterally   TREATMENT          DATE: 05/04/2023 Supine quadriceps sets 10 x 5 seconds Seated straight leg raises 3 sets of 5 for 3 seconds with 2# Knee extension machine 90-40 degrees with 10 pounds 2 sets of 10 with slow eccentric  Functional Activities: Sit to stand with slow eccentrics and no upper extremity support 5 times Modified sit to stand holding onto parallel bars and dropping her hips slowly backwards, keep hips above knees 10 times with slow eccentric  Neuromuscular re-education:  Tandem balance 5 x 20 seconds bilaterally  PATIENT EDUCATION:  05/18/2023 Education details: HEP update, discussion about surgical procedure and after care plans.  Person educated: Patient Education method: Explanation, Demonstration, Tactile cues, Verbal cues, and Handouts Education comprehension: verbalized understanding, returned demonstration, and verbal cues required  HOME EXERCISE PROGRAM: Access Code: [email or web address] URL: [email or web address] Date: 05/18/2023   Exercises - Supine Quadricep Sets  - 2 x daily - 7 x weekly - 2 sets - 10 reps - 5 second hold - Supine Heel Slide (Mirrored)  - 3-5 x daily - 7 x weekly - 1-2 sets - 10 reps - 2 hold - Small Range Straight Leg Raise  - 2 x daily - 7 x weekly - 3 sets - 10 reps - 3 seconds hold - Sit to Stand Without Arm Support  - 2 x daily - 7 x weekly - 1 sets - 5 reps - Yoga Bridge  - 2 x daily - 7 x weekly - 1 sets - 10 reps - 5 seconds  hold - Tandem Stance  - 1 x daily - 7 x weekly - 1 sets -  5 reps - 20 second hold - Seated Long Arc Quad (Mirrored)  - 3-5 x daily - 7 x weekly - 1-2 sets - 10 reps - 2 hold  ASSESSMENT:  CLINICAL IMPRESSION: After discussion with patient today, Pt expressed desire to continue with HEP and hold in clinic PT while awaiting plans for surgery.  Pt was in agreement with continued HEP plan with possible return to clinic if surgery was postponed longer due to other medical concerns.  At this time, good overall ROM noted for Lt knee and good knowledge of HEP.   OBJECTIVE IMPAIRMENTS: Abnormal gait, decreased activity tolerance, decreased endurance, decreased knowledge of condition, difficulty walking, decreased strength, increased edema, impaired perceived functional ability, and pain.   ACTIVITY LIMITATIONS: standing, squatting, stairs, and locomotion level  PARTICIPATION LIMITATIONS: community activity  PERSONAL FACTORS: Kidney stones, high cholesterol, hypertension, osteoporosis are also affecting patient's functional outcome.   REHAB POTENTIAL: Good  CLINICAL DECISION MAKING: Stable/uncomplicated  EVALUATION COMPLEXITY: Low   GOALS: Goals reviewed with patient? Yes  SHORT TERM GOALS: Target date: 04/22/2023 Patricia Hoover will be independent with her day 1 home exercise program Baseline: Started 03/25/2023 Goal status: Met 05/04/2023  2.  Improve bilateral quadriceps strength to at least 35 pounds Baseline: 22.5/28.4 pounds Goal status:partially met   LONG TERM GOALS: Target date: 06/29/2023  Patricia Hoover will rate herself 70% functional on the patient specific functional score Baseline: 50% Goal status: Met 05/11/2023  2.  Patricia Hoover will report left knee pain consistently 0-2/10 on the visual analog scale Baseline: 0-3/10 Goal status: On Going 05/11/2023  3.  Improve left knee flexion active range of motion to 130 degrees Baseline: 122 degrees Goal status: on going 05/18/2023  4.  Improve  bilateral quadriceps strength to at least 50 pounds Baseline: 22.5/28.4 pounds Goal status: on going 05/18/2023  5.  Patricia Hoover will be independent with her long-term maintenance home exercise program at discharge Baseline: Started 03/25/2023 Goal status: On Going 05/11/2023   PLAN:  PT FREQUENCY: 2x/week  PT DURATION: 8 weeks  PLANNED INTERVENTIONS: 97110-Therapeutic exercises, 97530- Therapeutic activity, 97112- Neuromuscular re-education, 97535- Self Care, [id number]- Manual therapy, [phone]- Gait training, Patient/Family education, Balance training, Stair training, Cryotherapy, and Moist heat  PLAN FOR NEXT SESSION:  Hold PT for planned surgery possible.  Discharged after 30 days or surgery performed.    Ozell Silvan, PT, DPT, OCS, ATC 05/18/23  2:19 PM   PHYSICAL THERAPY DISCHARGE SUMMARY  Visits from Start of Care: 5  Current functional level related to goals / functional outcomes: See note   Remaining deficits: See note   Education / Equipment: HEP  Patient goals were partially met. Patient is being discharged due to surgery performed later summer.SABRA Ozell Silvan, PT, DPT, OCS, ATC 08/30/23  2:11 PM

## 2023-05-27 ENCOUNTER — Encounter: Admitting: Rehabilitative and Restorative Service Providers"

## 2023-05-31 ENCOUNTER — Encounter: Admitting: Physical Therapy

## 2023-06-02 ENCOUNTER — Ambulatory Visit (INDEPENDENT_AMBULATORY_CARE_PROVIDER_SITE_OTHER): Admitting: Gastroenterology

## 2023-06-02 VITALS — BP 138/81 | HR 84 | Temp 97.8°F | Wt 145.4 lb

## 2023-06-02 DIAGNOSIS — Z8711 Personal history of peptic ulcer disease: Secondary | ICD-10-CM

## 2023-06-02 DIAGNOSIS — Z09 Encounter for follow-up examination after completed treatment for conditions other than malignant neoplasm: Secondary | ICD-10-CM

## 2023-06-02 DIAGNOSIS — D508 Other iron deficiency anemias: Secondary | ICD-10-CM

## 2023-06-02 DIAGNOSIS — Z8719 Personal history of other diseases of the digestive system: Secondary | ICD-10-CM

## 2023-06-02 MED ORDER — FUSION PLUS PO CAPS
1.0000 | ORAL_CAPSULE | ORAL | Status: DC
Start: 2023-06-02 — End: 2023-09-09

## 2023-06-02 NOTE — Progress Notes (Signed)
 [name], MD [address]  [address]  [place], [place] [address]  Main: [phone]  Fax: [phone]    Gastroenterology Consultation  Referring Provider:     Donnie Galea, MD Primary Care Physician:  Donnie Galea, MD Primary Gastroenterologist:  Dr. [name] Reason for Consultation: Lower GI bleed        HPI:   Patricia Hoover is a 78 y.o. female referred by Dr. Donnie Galea, MD  for consultation & management of lower GI bleed.  [name] was admitted to [facility] on 03/06/2023 after she presented with hematochezia resulting in severe blood loss anemia, hemoglobin dropped to 8.8 from 13.  Normal BUN/creatinine.  She underwent CT angio GI bleed protocol which did not reveal any active extravasation other than sigmoid diverticulosis.  Inpatient GI team was consulted who recommended conservative management.  She was discharged to home with outpatient GI follow-up.  Patient reports occasional constipation and intermittent hard stools.  Mostly, her bowels are regular.  She is planning to undergo total knee arthroplasty scheduled for 7/8 and she was told that she will need temporary anticoagulation post surgery.  She does not have any other concerns today.She has not been taking any oral iron.  Her hemoglobin was 12.8 on 04/28/23, ferritin 10.  NSAIDs: None  Antiplts/Anticoagulants/Anti thrombotics: None  GI Procedures:  Upper endoscopy 04/08/2017 - Normal esophagus. - Z- line irregular, 38 cm from the incisors. - Erythematous mucosa in the antrum. Biopsied. - Duodenal deformity ( characterized by mild angulation at healed ulcer site) . Biopsied. - Normal duodenal bulb, second portion of the duodenum and examined duodenum. - Biopsies were obtained in the gastric body, at the incisura and in the gastric antrum. DIAGNOSIS:  A. 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  - NEGATIVE FOR  INTRA-EPITHELIAL LYMPHOCYTOSIS, DYSPLASIA AND MALIGNANCY.   B.  STOMACH, ANTRUM, BODY AND INCISURA; COLD BIOPSY:  - ANTRAL MUCOSA WITH MILD CHRONIC GASTRITIS AND FOCAL MILD ACTIVITY.  - OXYNTIC MUCOSA WITH MILD CHRONIC GASTRITIS.  - NEGATIVE FOR H. PYLORI, DYSPLASIA AND MALIGNANCY.   Colonoscopy 03/30/2017 - Preparation of the colon was fair. - Non- thrombosed external hemorrhoids found on perianal exam. One of these was large but non bleeding. - Diverticulosis in the sigmoid colon, in the descending colon and in the ascending colon. - The rectum, sigmoid colon, descending colon, transverse colon, ascending colon and cecum are normal. - Non- bleeding internal hemorrhoids.  Past Medical History:  Diagnosis Date   Angioedema 06/15/2017   Arrhythmia    possible hx, resolved prev   Blood transfusion without reported diagnosis 1972   had reaction; had to stop   Carpal tunnel syndrome    Cataract 2019   resolved with surgery   Duodenal ulcer    H/O exercise stress test 2012   normal   Heart murmur    as child   High cholesterol    History of kidney stones    Hx of colonic polyps    Hypertension    Osteoporosis 2010   t score - 3.9   Peptic ulcer of duodenum    Renal stones    Vitamin D  deficiency 03/01/2021    Past Surgical History:  Procedure Laterality Date   ABDOMINAL HYSTERECTOMY  1972   for endometriosis   CATARACT EXTRACTION W/ INTRAOCULAR LENS IMPLANT Right 06/2017   CATARACT EXTRACTION W/ INTRAOCULAR LENS IMPLANT Left 07/2017   COLONOSCOPY  Left 03/02/2017   Procedure: COLONOSCOPY;  Surgeon: Irby Mannan, MD;  Location: [facility] ENDOSCOPY;  Service: Endoscopy;  Laterality: Left;   COLONOSCOPY WITH PROPOFOL  N/A 05/04/2016   Procedure: COLONOSCOPY WITH PROPOFOL ;  Surgeon: Luke Salaam, MD;  Location: [facility] ENDOSCOPY;  Service: Endoscopy;  Laterality: N/A;   ESOPHAGOGASTRODUODENOSCOPY Left 03/02/2017   Procedure: ESOPHAGOGASTRODUODENOSCOPY (EGD);  Surgeon: Irby Mannan, MD;  Location[facility] ENDOSCOPY;  Service: Endoscopy;  Laterality: Left;   ESOPHAGOGASTRODUODENOSCOPY (EGD) WITH PROPOFOL  N/A 04/08/2017   Procedure: ESOPHAGOGASTRODUODENOSCOPY (EGD) WITH PROPOFOL ;  Surgeon: Irby Mannan, MD;  Location: [facility] ENDOSCOPY;  Service: Endoscopy;  Laterality: N/A;   LITHOTRIPSY     for kidney stone x5   LITHOTRIPSY  09/2017     Current Outpatient Medications:    amLODipine  (NORVASC ) 2.5 MG tablet, Take 1 tablet (2.5 mg total) by mouth daily., Disp: 90 tablet, Rfl: 3   Ascorbic Acid (VITAMIN C GUMMIE PO), Take 2 each by mouth daily., Disp: , Rfl:    Cholecalciferol  (VITAMIN D3) 50 MCG (2000 UT) capsule, Take 2 capsules (4,000 Units total) by mouth daily., Disp: , Rfl:    indapamide  (LOZOL ) 2.5 MG tablet, Take 1 tablet (2.5 mg total) by mouth daily., Disp: 90 tablet, Rfl: 3   Iron-FA-B Cmp-C-Biot-Probiotic (FUSION PLUS) CAPS, Take 1 capsule by mouth every other day., Disp: , Rfl:    Potassium Citrate  15 MEQ (1620 MG) TBCR, Take 1 tablet by mouth daily., Disp: , Rfl:    simvastatin  (ZOCOR ) 10 MG tablet, Take 1 tablet (10 mg total) by mouth every other day., Disp: 45 tablet, Rfl: 3   Family History  Problem Relation Age of Onset   Heart disease Mother    Renal Disease Mother    Heart failure Mother    Colon cancer Neg Hx    Breast cancer Neg Hx      Social History   Tobacco Use   Smoking status: Former    Current packs/day: 0.00    Types: Cigarettes    Quit date: 01/19/1991    Years since quitting: 32.3   Smokeless tobacco: Never  Vaping Use   Vaping status: Never Used  Substance Use Topics   Alcohol use: No    Comment: rare wine   Drug use: No    Allergies as of 06/02/2023 - Review Complete 06/02/2023  Allergen Reaction Noted   Ace inhibitors Swelling 06/13/2017   Angiotensin receptor blockers  06/14/2017   Aspirin Other (See Comments) 03/15/2017   Bisphosphonates  08/18/2012   Nsaids  03/15/2017   Prolia [denosumab]  06/21/2013     Review of Systems:    All systems reviewed and negative except where noted in HPI.   Physical Exam:  BP 138/81 (BP Location: Left Arm, Patient Position: Sitting, Cuff Size: Normal)   Pulse 84   Temp 97.8 F (36.6 C) (Oral)   Wt 145 lb 6.4 oz (66 kg)   BMI 31.46 kg/m  No LMP recorded. Patient has had a hysterectomy.  General:   Alert,  Well-developed, well-nourished, pleasant and cooperative in NAD Head:  Normocephalic and atraumatic. Eyes:  Sclera clear, no icterus.   Conjunctiva pink. Ears:  Normal auditory acuity. Nose:  No deformity, discharge, or lesions. Mouth:  No deformity or lesions,oropharynx pink & moist. Neck:  Supple; no masses or thyromegaly. Lungs:  Respirations even and unlabored.  Clear throughout to auscultation.   No wheezes, crackles, or rhonchi. No acute distress. Heart:  Regular rate and rhythm; no murmurs, clicks,  rubs, or gallops. Abdomen:  Normal bowel sounds. Soft, non-tender and non-distended without masses, hepatosplenomegaly or hernias noted.  No guarding or rebound tenderness.   Rectal: Not performed Msk:  Symmetrical without gross deformities. Good, equal movement & strength bilaterally. Pulses:  Normal pulses noted. Extremities:  No clubbing or edema.  No cyanosis. Neurologic:  Alert and oriented x3;  grossly normal neurologically. Skin:  Intact without significant lesions or rashes. No jaundice. Psych:  Alert and cooperative. Normal mood and affect.  Imaging Studies: Reviewed  Assessment and Plan:   Kamorie Aldous is a 78 y.o. female with past history of peptic ulcer disease is seen for follow-up of lower GI bleed resulting in acute blood loss anemia secondary to colonic diverticular bleed.  Bleeding stopped on its own.  Anemia resolved.  Patient is having regular bowel movements.  Do not recommend any further workup at this time Recommend fusion plus every other day If patient needs anticoagulation postoperatively after knee replacement, she  can be take it understanding that there is always a risk for rebleeding from diverticulosis.  But, this should not preclude her from taking anticoagulation Patient expressed understanding of my recommendation   Follow up as needed   [name], MD

## 2023-06-02 NOTE — Patient Instructions (Signed)
 Take Fusion Plus every other day...   This can be purchased over the counter

## 2023-06-03 ENCOUNTER — Encounter: Admitting: Rehabilitative and Restorative Service Providers"

## 2023-06-04 ENCOUNTER — Encounter: Payer: Self-pay | Admitting: Gastroenterology

## 2023-06-08 ENCOUNTER — Encounter: Admitting: Rehabilitative and Restorative Service Providers"

## 2023-06-10 ENCOUNTER — Encounter: Admitting: Rehabilitative and Restorative Service Providers"

## 2023-06-23 ENCOUNTER — Telehealth: Payer: Self-pay

## 2023-06-23 NOTE — Telephone Encounter (Signed)
 Patient walked in and wanting to know if the provider will approve for a stair lyft.  Please f/u with patient.

## 2023-06-24 NOTE — Telephone Encounter (Signed)
Error Please see other telephone encounter

## 2023-06-26 NOTE — Telephone Encounter (Signed)
 I do not know if she is going to qualify, meaning I do not know if her insurance will cover it.  She has knee surgery pending.    If she gets paperwork from the company regarding the application process I can look at it.  Please have her send that in.  Then we could talk about her situation at an office visit to try to get it done.  Thanks.

## 2023-06-27 NOTE — Telephone Encounter (Signed)
 Spoke with patient. She is already aware that her insurance is not going to cover the stair lift.

## 2023-06-28 ENCOUNTER — Emergency Department
Admission: EM | Admit: 2023-06-28 | Discharge: 2023-06-28 | Disposition: A | Discharge status: Home / Self Care | Attending: Emergency Medicine | Admitting: Emergency Medicine

## 2023-06-28 ENCOUNTER — Ambulatory Visit: Payer: Self-pay

## 2023-06-28 DIAGNOSIS — K625 Hemorrhage of anus and rectum: Secondary | ICD-10-CM | POA: Diagnosis not present

## 2023-06-28 DIAGNOSIS — K644 Residual hemorrhoidal skin tags: Secondary | ICD-10-CM | POA: Insufficient documentation

## 2023-06-28 LAB — COMPREHENSIVE METABOLIC PANEL WITH GFR
ALT: 12 U/L (ref 0–44)
AST: 15 U/L (ref 15–41)
Albumin: 3.9 g/dL (ref 3.5–5.0)
Alkaline Phosphatase: 63 U/L (ref 38–126)
Anion gap: 11 (ref 5–15)
BUN: 20 mg/dL (ref 8–23)
CO2: 27 mmol/L (ref 22–32)
Calcium: 9.4 mg/dL (ref 8.9–10.3)
Chloride: 105 mmol/L (ref 98–111)
Creatinine, Ser: 0.51 mg/dL (ref 0.44–1.00)
GFR, Estimated: >60 mL/min (ref >60)
Glucose, Bld: 98 mg/dL (ref 70–99)
Potassium: 3.2 mmol/L — ABNORMAL LOW (ref 3.5–5.1)
Sodium: 143 mmol/L (ref 135–145)
Total Bilirubin: 0.6 mg/dL (ref 0.0–1.2)
Total Protein: 7 g/dL (ref 6.5–8.1)

## 2023-06-28 LAB — CBC WITH DIFFERENTIAL/PLATELET
Abs Immature Granulocytes: 0.02 10*3/uL (ref 0.00–0.07)
Basophils Absolute: 0 10*3/uL (ref 0.0–0.1)
Basophils Relative: 1 %
Eosinophils Absolute: 0.1 10*3/uL (ref 0.0–0.5)
Eosinophils Relative: 1 %
HCT: 46.3 % — ABNORMAL HIGH (ref 36.0–46.0)
Hemoglobin: 14.8 g/dL (ref 12.0–15.0)
Immature Granulocytes: 0 %
Lymphocytes Relative: 26 %
Lymphs Abs: 2.3 10*3/uL (ref 0.7–4.0)
MCH: 28.1 pg (ref 26.0–34.0)
MCHC: 32 g/dL (ref 30.0–36.0)
MCV: 88 fL (ref 80.0–100.0)
Monocytes Absolute: 0.6 10*3/uL (ref 0.1–1.0)
Monocytes Relative: 7 %
Neutro Abs: 5.8 10*3/uL (ref 1.7–7.7)
Neutrophils Relative %: 65 %
Platelets: 206 10*3/uL (ref 150–400)
RBC: 5.26 MIL/uL — ABNORMAL HIGH (ref 3.87–5.11)
RDW: 13.6 % (ref 11.5–15.5)
WBC: 8.9 10*3/uL (ref 4.0–10.5)
nRBC: 0 % (ref 0.0–0.2)

## 2023-06-28 LAB — APTT: aPTT: 27 s (ref 24–36)

## 2023-06-28 LAB — PROTIME-INR
INR: 1 (ref 0.8–1.2)
Prothrombin Time: 13.5 s (ref 11.4–15.2)

## 2023-06-28 LAB — TYPE AND SCREEN
ABO/RH(D): O POS
Antibody Screen: NEGATIVE

## 2023-06-28 MED ORDER — HYDROCORTISONE ACETATE 25 MG RE SUPP: 25.0000 mg | Freq: Every day | RECTAL | 0 refills | Status: AC

## 2023-06-28 NOTE — Telephone Encounter (Signed)
 Noted. Thanks.

## 2023-06-28 NOTE — Discharge Instructions (Addendum)
 This bleeding could be from a hemorrhoid versus your diverticulosis.  We discussed admission to the hospital but given how well-appearing and minimal bleeding you have opted to want to go home and monitor at home given your normal hemoglobin.  You should use a high-fiber diet and you can use the Anusol  to help with these are from hemorrhoids.  You should return to the ER if you develop worsening symptoms or any other concerns otherwise follow-up with GI  If you cannot get a hemoglobin rechecked with your primary care doctor this week you can always return to the emergency room for recheck of your hemoglobin

## 2023-06-28 NOTE — ED Triage Notes (Signed)
 Pt here with rectal bleeding that started earlier this week but was heavy this morning. Pt states the blood was bright and dark red in color, denies clots. Pt denies abd pain. Pt has a hx of diverticulosis.

## 2023-06-28 NOTE — Telephone Encounter (Signed)
 FYI Only or Action Required?: FYI only for provider  Patient was last seen in primary care on 04/28/2023 by Donnie Galea, MD. Called Nurse Triage reporting Rectal Bleeding. Symptoms began several days ago. Interventions attempted: Nothing. Symptoms are: gradually worsening.  Triage Disposition: Go to ED Now (Notify PCP)   Patient/caregiver understands and will follow disposition?: YesCopied from CRM [id number]. Topic: Clinical - Red Word Triage >> Jun 28, 2023  8:33 AM Emmet Harm C wrote: Red Word that prompted transfer to Nurse Triage: patient has dieticilitis and is seeing blood in stool Reason for Disposition  SEVERE rectal bleeding (large blood clots; constant or on and off bleeding)  Answer Assessment - Initial Assessment Questions 1. APPEARANCE of BLOOD: "What color is it?" "Is it passed separately, on the surface of the stool, or mixed in with the stool?"      Bright, mixed with  stool 2. AMOUNT: "How much blood was passed?"      A good bit 3. FREQUENCY: "How many times has blood been passed with the stools?"      today 4. ONSET: "When was the blood first seen in the stools?" (Days or weeks)      Days ago when wiping  5. DIARRHEA: "Is there also some diarrhea?" If Yes, ask: "How many diarrhea stools in the past 24 hours?"      No 6. CONSTIPATION: "Do you have constipation?" If Yes, ask: "How bad is it?"     No  7. RECURRENT SYMPTOMS: "Have you had blood in your stools before?" If Yes, ask: "When was the last time?" and "What happened that time?"      Na  8. BLOOD THINNERS: "Do you take any blood thinners?" (e.g., Coumadin/warfarin, Pradaxa/dabigatran, aspirin)     Denies  9. OTHER SYMPTOMS: "Do you have any other symptoms?"  (e.g., abdomen pain, vomiting, dizziness, fever)     Denies      Pt  noticed earlier in week when wiping there was blood on tissue. It has progressed to blood mixed with stool and possible blood clot.  Protocols used: Rectal Bleeding-A-AH

## 2023-06-28 NOTE — ED Provider Notes (Signed)
 [facility] Provider Note    Event Date/Time   First MD Initiated Contact with Patient 06/28/23 [phone]     (approximate)   History   Rectal Bleeding   HPI  Patricia Hoover is a 78 y.o. female who comes in with rectal bleeding.  Patient has had bright and dark red in color.  Does have a history of diverticulosis as well as hemorrhoids.  This been going on for about 1 week. Initial blood just on toilet paper. But now she is having blood streaks in the stool and some blood noted in the toilet bowl.  She denies being on any blood thinners.  I reviewed with patient had CT imaging on 03/09/2023 for rectal bleeding without any evidence of active hemorrhage.  I reviewed a note from 5/15 where patient had severe blood loss anemia secondary to hematochezia and hemoglobin drop from 8.8-3.  Patient states that when she was admitted previously she never had a blood transfusion in which she was sent home on iron.  She is not currently on iron  I reviewed her prior colonscopy from 2019  Preparation of the colon was fair. - Non- thrombosed external hemorrhoids found on perianal exam. One of these was large but non bleeding. - Diverticulosis in the sigmoid colon, in the descending colon and in the ascending colon. - The rectum, sigmoid colon, descending colon, transverse colon, ascending colon and cecum are normal. - Non- bleeding internal hemorrhoids. - No specimens collected. - The hemorrhoids were the likely source of her hematochezia as evidenced by her clinical history and labs showing a non- significant drop in her Hgb from baseline. Recommendation: - Use hydrocortisone  suppository 25 mg 1 per rectum once a day for 10 days. - Consult surgery for hemorrhoidectomy of the large external hemorrhoid if patient continues to have Hematochezia - High fiber diet.     Physical Exam   Triage Vital Signs: ED Triage Vitals  Encounter Vitals Group     BP 06/28/23 0945 (!) 154/73      Systolic BP Percentile --      Diastolic BP Percentile --      Pulse Rate 06/28/23 0945 74     Resp 06/28/23 0945 16     Temp 06/28/23 0945 99 F (37.2 C)     Temp Source 06/28/23 0945 Oral     SpO2 06/28/23 0945 95 %     Weight 06/28/23 0946 145 lb 8.1 oz (66 kg)     Height 06/28/23 0946 4\' 9"  (1.448 m)     Head Circumference --      Peak Flow --      Pain Score 06/28/23 0946 0     Pain Loc --      Pain Education --      Exclude from Growth Chart --     Most recent vital signs: Vitals:   06/28/23 0945  BP: (!) 154/73  Pulse: 74  Resp: 16  Temp: 99 F (37.2 C)  SpO2: 95%     General: Awake, no distress.  CV:  Good peripheral perfusion.  Resp:  Normal effort.  Abd:  No distention.  Soft and nontender Other:  No stool in the vault.  Trace bright red blood.  external hemorrhoid nonbleeding.  Possibly palpated internal hemorrhoid   ED Results / Procedures / Treatments   Labs (all labs ordered are listed, but only abnormal results are displayed) Labs Reviewed  CBC WITH DIFFERENTIAL/PLATELET - Abnormal; Notable for the following  components:      Result Value   RBC 5.26 (*)    HCT 46.3 (*)    All other components within normal limits  COMPREHENSIVE METABOLIC PANEL WITH GFR  PROTIME-INR  APTT  TYPE AND SCREEN      PROCEDURES:  Critical Care performed: No  Procedures   MEDICATIONS ORDERED IN ED: Medications - No data to display   IMPRESSION / MDM / ASSESSMENT AND PLAN / ED COURSE  I reviewed the triage vital signs and the nursing notes.   Patient's presentation is most consistent with acute presentation with potential threat to life or bodily function.   Patient comes in with hematochezia.  She has had this frequently secondary to internal hemorrhoids versus diverticulosis.  Her vital signs are stable and exam just shows a tiny bit of right red blood.  She does have hemorrhoids present which could be the cause but we discussed that it could be from the  diverticulosis.  She is never required a blood transfusion previously she is not on any blood thinners.  Hemoglobin is 14.8.  Coags are normal.  CMP shows reassuring creatinine.   We discussed CT imaging but she has had CT angio before which she did have a hemoglobin drop and it was negative for any active bleed.  Given her stable vital signs and no significant bleeding on rectal exam I suspect that it would be negative today.  She denies any abdominal pain so at this time are going to hold off on CT imaging after discussion with patient  I offered patient admission to the hospital to trend out her hemoglobins but given how well-appearing she is vital signs are stable and she has had bleeds before that have been managed conservatively she would prefer to go home and follow-up outpatient with GI.  She understands that if her bleeding gets worse that she will return.  I will prescribe some Anusol  for her hemorrhoids and we talked about high fiber diet as this is what they had recommended previously when she has had GI bleeding.  At this time she would prefer discharge home and understands return for worsening symptoms or any other concerns    FINAL CLINICAL IMPRESSION(S) / ED DIAGNOSES   Final diagnoses:  Rectal bleeding  External hemorrhoid     Rx / DC Orders   ED Discharge Orders          Ordered    hydrocortisone  (ANUSOL -HC) 25 MG suppository  Daily        06/28/23 1246             Note:  This document was prepared using Dragon voice recognition software and may include unintentional dictation errors.   Lubertha Rush, MD 06/28/23 1247

## 2023-06-29 ENCOUNTER — Ambulatory Visit: Payer: Self-pay

## 2023-06-29 ENCOUNTER — Observation Stay (HOSPITAL_COMMUNITY)
Admission: EM | Admit: 2023-06-29 | Discharge: 2023-07-01 | Disposition: A | Discharge status: Home / Self Care | Attending: Emergency Medicine | Admitting: Emergency Medicine

## 2023-06-29 ENCOUNTER — Other Ambulatory Visit: Payer: Self-pay

## 2023-06-29 DIAGNOSIS — R935 Abnormal findings on diagnostic imaging of other abdominal regions, including retroperitoneum: Secondary | ICD-10-CM | POA: Insufficient documentation

## 2023-06-29 DIAGNOSIS — Z87891 Personal history of nicotine dependence: Secondary | ICD-10-CM | POA: Insufficient documentation

## 2023-06-29 DIAGNOSIS — Z8619 Personal history of other infectious and parasitic diseases: Secondary | ICD-10-CM | POA: Diagnosis not present

## 2023-06-29 DIAGNOSIS — I10 Essential (primary) hypertension: Secondary | ICD-10-CM | POA: Diagnosis not present

## 2023-06-29 DIAGNOSIS — E876 Hypokalemia: Secondary | ICD-10-CM | POA: Diagnosis present

## 2023-06-29 DIAGNOSIS — E785 Hyperlipidemia, unspecified: Secondary | ICD-10-CM | POA: Diagnosis not present

## 2023-06-29 DIAGNOSIS — Z6831 Body mass index (BMI) 31.0-31.9, adult: Secondary | ICD-10-CM | POA: Insufficient documentation

## 2023-06-29 DIAGNOSIS — D62 Acute posthemorrhagic anemia: Secondary | ICD-10-CM | POA: Insufficient documentation

## 2023-06-29 DIAGNOSIS — E66811 Obesity, class 1: Secondary | ICD-10-CM | POA: Diagnosis not present

## 2023-06-29 DIAGNOSIS — K922 Gastrointestinal hemorrhage, unspecified: Secondary | ICD-10-CM | POA: Diagnosis not present

## 2023-06-29 DIAGNOSIS — K649 Unspecified hemorrhoids: Secondary | ICD-10-CM | POA: Diagnosis present

## 2023-06-29 LAB — CBC WITH DIFFERENTIAL/PLATELET
Abs Immature Granulocytes: 0.03 10*3/uL (ref 0.00–0.07)
Basophils Absolute: 0.1 10*3/uL (ref 0.0–0.1)
Basophils Relative: 1 %
Eosinophils Absolute: 0.1 10*3/uL (ref 0.0–0.5)
Eosinophils Relative: 1 %
HCT: 36.7 % (ref 36.0–46.0)
Hemoglobin: 12.3 g/dL (ref 12.0–15.0)
Immature Granulocytes: 0 %
Lymphocytes Relative: 27 %
Lymphs Abs: 2.7 10*3/uL (ref 0.7–4.0)
MCH: 28.5 pg (ref 26.0–34.0)
MCHC: 33.5 g/dL (ref 30.0–36.0)
MCV: 85 fL (ref 80.0–100.0)
Monocytes Absolute: 0.7 10*3/uL (ref 0.1–1.0)
Monocytes Relative: 7 %
Neutro Abs: 6.6 10*3/uL (ref 1.7–7.7)
Neutrophils Relative %: 64 %
Platelets: 261 10*3/uL (ref 150–400)
RBC: 4.32 MIL/uL (ref 3.87–5.11)
RDW: 13.4 % (ref 11.5–15.5)
WBC: 10.2 10*3/uL (ref 4.0–10.5)
nRBC: 0 % (ref 0.0–0.2)

## 2023-06-29 LAB — COMPREHENSIVE METABOLIC PANEL WITH GFR
ALT: 11 U/L (ref 0–44)
AST: 14 U/L — ABNORMAL LOW (ref 15–41)
Albumin: 3.3 g/dL — ABNORMAL LOW (ref 3.5–5.0)
Alkaline Phosphatase: 46 U/L (ref 38–126)
Anion gap: 12 (ref 5–15)
BUN: 21 mg/dL (ref 8–23)
CO2: 24 mmol/L (ref 22–32)
Calcium: 9.3 mg/dL (ref 8.9–10.3)
Chloride: 105 mmol/L (ref 98–111)
Creatinine, Ser: 0.64 mg/dL (ref 0.44–1.00)
GFR, Estimated: >60 mL/min (ref >60)
Glucose, Bld: 145 mg/dL — ABNORMAL HIGH (ref 70–99)
Potassium: 2.8 mmol/L — ABNORMAL LOW (ref 3.5–5.1)
Sodium: 141 mmol/L (ref 135–145)
Total Bilirubin: 0.4 mg/dL (ref 0.0–1.2)
Total Protein: 6.2 g/dL — ABNORMAL LOW (ref 6.5–8.1)

## 2023-06-29 LAB — PROTIME-INR
INR: 1 (ref 0.8–1.2)
Prothrombin Time: 13.6 s (ref 11.4–15.2)

## 2023-06-29 LAB — TYPE AND SCREEN
ABO/RH(D): O POS
Antibody Screen: NEGATIVE

## 2023-06-29 MED ORDER — POTASSIUM CHLORIDE CRYS ER 20 MEQ PO TBCR
40.0000 meq | EXTENDED_RELEASE_TABLET | Freq: Once | ORAL | Status: AC
  Administered 2023-06-29: 40 meq via ORAL
  Filled 2023-06-29: qty 2

## 2023-06-29 NOTE — Telephone Encounter (Signed)
 Agree. Thanks

## 2023-06-29 NOTE — Telephone Encounter (Signed)
 FYI Only or Action Required?: FYI only for provider  Patient was last seen in primary care on 04/28/2023 by Donnie Galea, MD. Called Nurse Triage reporting Diarrhea. Symptoms began yesterday. Interventions attempted: Nothing. Symptoms are: gradually worsening.  Triage Disposition: Go to ED Now (Notify PCP)  Patient/caregiver understands and will follow disposition?: Yes, will follow disposition  Copied from CRM [id number]. Topic: Clinical - Red Word Triage >> Jun 29, 2023  2:59 PM Howard Macho wrote: Red Word that prompted transfer to Nurse Triage: patient called stating every time she goes to the bathroom it is dark blood and she can't tell if it is a clot or stool. No pain just diarrhea that is red. Patient states she was triaged about this on 6/10 Reason for Disposition  [1] MODERATE rectal bleeding (small blood clots, passing blood without stool, or toilet water turns red) AND [2] more than once a day  Answer Assessment - Initial Assessment Questions 1. APPEARANCE of BLOOD: What color is it? Is it passed separately, on the surface of the stool, or mixed in with the stool?      Unsure, if mixed in stool or not, pt is not certain 3. FREQUENCY: How many times has blood been passed with the stools?      About 4 episodes today, states it is every time she uses the restroom 4. ONSET: When was the blood first seen in the stools? (Days or weeks)      Yesterday 8. BLOOD THINNERS: Do you take any blood thinners? (e.g., Coumadin/warfarin, Pradaxa/dabigatran, aspirin)     denies 9. OTHER SYMPTOMS: Do you have any other symptoms?  (e.g., abdomen pain, vomiting, dizziness, fever)     Denies  Pt states that she was seen yesterday in the ED for rectal bleed. Pt states that they told her to f/u with PCP to have HGB rechecked in 2 days and come back to the ED if the bleeding increases. Pt states that today she has had about 4 BM's each containing blood. Pt is unsure at this time if the stools are  mixed with blood or if it is just blood/clots. Pt denies dizziness, lightheadedness, abd pain. Pt advised to return to the ED today as she reports worsening bleeding.  Protocols used: Rectal Bleeding-A-AH

## 2023-06-29 NOTE — ED Triage Notes (Signed)
 The pt has hemorrhoids and since yesterday she has had rectal pain.  All day today she has had dark colored stools that is loose.  Hx of diverticulosis

## 2023-06-29 NOTE — ED Provider Triage Note (Signed)
 Emergency Medicine Provider Triage Evaluation Note  Patricia Hoover , a 78 y.o. female  was evaluated in triage.  Pt complains of bloody stools.  Noticed them yesterday.  Dark in color.  Was seen at PCP who stated the bleeding is likely from hemorrhoids.  She does have a history of diverticulosis.  Denies shortness of breath, lightheadedness and fatigue.  Denies use of blood thinner.  Denies abdominal pain.  Review of Systems  Positive: See above Negative: See above  Physical Exam  BP (!) 149/100   Pulse 79   Temp 98.4 F (36.9 C)   Resp 16   Ht 4' 9 (1.448 m)   Wt 66 kg   SpO2 100%   BMI 31.49 kg/m  Gen:   Awake, no distress   Resp:  Normal effort  MSK:   Moves extremities without difficulty  Other:    Medical Decision Making  Medically screening exam initiated at 4:29 PM.  Appropriate orders placed.  Patricia Hoover was informed that the remainder of the evaluation will be completed by another provider, this initial triage assessment does not replace that evaluation, and the importance of remaining in the ED until their evaluation is complete.  Work up started   Patricia Mcmurray, PA-C 06/29/23 1631

## 2023-06-29 NOTE — ED Provider Notes (Signed)
 [place] [facility] Provider Note   CSN: [id number] Arrival date & time: 06/29/23  1615     History  Chief Complaint  Patient presents with   Hemorrhoids    Patricia Hoover is a 78 y.o. female.  HPI Patient has a history of diverticulosis and internal hemorrhoids.  She has had recurrent episodes of bleeding for the past 2 days.  Today she has passed 5 episodes of dark looking blood with clots in it.  1 episode in the waiting room.  The patient denied having abdominal pain.  She has not had any vomiting.  She is not anticoagulated.  No syncope or near syncope.    Home Medications Prior to Admission medications   Medication Sig Start Date End Date Taking? Authorizing Provider  amLODipine  (NORVASC ) 2.5 MG tablet Take 1 tablet (2.5 mg total) by mouth daily. 03/04/23   Donnie Galea, MD  Ascorbic Acid (VITAMIN C GUMMIE PO) Take 2 each by mouth daily.    [provider]  Cholecalciferol  (VITAMIN D3) 50 MCG (2000 UT) capsule Take 2 capsules (4,000 Units total) by mouth daily. 03/01/21   Donnie Galea, MD  hydrocortisone  (ANUSOL -HC) 25 MG suppository Place 1 suppository (25 mg total) rectally daily for 5 days. 06/28/23 07/03/23  Lubertha Rush, MD  indapamide  (LOZOL ) 2.5 MG tablet Take 1 tablet (2.5 mg total) by mouth daily. 03/04/23   Donnie Galea, MD  Iron-FA-B Cmp-C-Biot-Probiotic (FUSION PLUS) CAPS Take 1 capsule by mouth every other day. 06/02/23   Selena Daily, MD  Potassium Citrate  15 MEQ (1620 MG) TBCR Take 1 tablet by mouth daily. 03/18/23   Donnie Galea, MD  simvastatin  (ZOCOR ) 10 MG tablet Take 1 tablet (10 mg total) by mouth every other day. 03/04/23   Donnie Galea, MD      Allergies    Ace inhibitors, Angiotensin receptor blockers, Aspirin, Bisphosphonates, Nsaids, and Prolia [denosumab]    Review of Systems   Review of Systems  Physical Exam Updated Vital Signs BP (!) 152/66   Pulse 64   Temp 97.9 F (36.6 C)  (Oral)   Resp 17   Ht 4' 9 (1.448 m)   Wt 66 kg   SpO2 100%   BMI 31.49 kg/m  Physical Exam Constitutional:      Comments: Alert nontoxic.  Clear mental status.  No respiratory distress.  HENT:     Mouth/Throat:     Pharynx: Oropharynx is clear.   Eyes:     Extraocular Movements: Extraocular movements intact.    Cardiovascular:     Rate and Rhythm: Normal rate and regular rhythm.  Pulmonary:     Effort: Pulmonary effort is normal.     Breath sounds: Normal breath sounds.  Abdominal:     General: There is no distension.     Palpations: Abdomen is soft.     Tenderness: There is no abdominal tenderness. There is no guarding.   Musculoskeletal:        General: No swelling or tenderness. Normal range of motion.     Right lower leg: No edema.     Left lower leg: No edema.   Skin:    General: Skin is warm and dry.   Neurological:     General: No focal deficit present.     Mental Status: She is oriented to person, place, and time.     Motor: No weakness.     Coordination: Coordination normal.  Psychiatric:        Mood and Affect: Mood normal.     ED Results / Procedures / Treatments   Labs (all labs ordered are listed, but only abnormal results are displayed) Labs Reviewed  COMPREHENSIVE METABOLIC PANEL WITH GFR - Abnormal; Notable for the following components:      Result Value   Potassium 2.8 (*)    Glucose, Bld 145 (*)    Total Protein 6.2 (*)    Albumin 3.3 (*)    AST 14 (*)    All other components within normal limits  CBC WITH DIFFERENTIAL/PLATELET  PROTIME-INR  TYPE AND SCREEN    EKG None  Radiology No results found.  Procedures Procedures    Medications Ordered in ED Medications  potassium chloride  SA (KLOR-CON  M) CR tablet 40 mEq (has no administration in time range)    ED Course/ Medical Decision Making/ A&P                                 Medical Decision Making Risk Prescription drug management. Decision regarding  hospitalization.   Patient presents as outlined with known history of diverticular disease.  Patient illustrated an image of a basin with dark blood with multiple clots present within it.  Bleeding consistent with lower GI bleed.  Patient was seen yesterday and hemoglobin was at 14.  Today it is at 54.  Most likely, given patient's history she is having likely diverticular bleeding which has been quite brisk over the past 2 days..  Will consult gastroenterology.  Anticipate admission for active GI bleed.        Final Clinical Impression(s) / ED Diagnoses Final diagnoses:  Lower GI bleed    Rx / DC Orders ED Discharge Orders     None         Wynetta Heckle, MD 07/08/23 [phone]

## 2023-06-29 NOTE — ED Notes (Addendum)
 Pt family would like to request a CT scan. Family states that pt has been seen by a provider yesterday. No CT was ordered pt was d/c.  Pt has started to actively pass clots in lobby restroom.

## 2023-06-30 ENCOUNTER — Encounter (HOSPITAL_COMMUNITY): Payer: Self-pay | Admitting: Family Medicine

## 2023-06-30 ENCOUNTER — Observation Stay (HOSPITAL_COMMUNITY)

## 2023-06-30 DIAGNOSIS — D62 Acute posthemorrhagic anemia: Secondary | ICD-10-CM

## 2023-06-30 DIAGNOSIS — E278 Other specified disorders of adrenal gland: Secondary | ICD-10-CM | POA: Diagnosis not present

## 2023-06-30 DIAGNOSIS — E785 Hyperlipidemia, unspecified: Secondary | ICD-10-CM | POA: Diagnosis not present

## 2023-06-30 DIAGNOSIS — I10 Essential (primary) hypertension: Secondary | ICD-10-CM | POA: Diagnosis not present

## 2023-06-30 DIAGNOSIS — R933 Abnormal findings on diagnostic imaging of other parts of digestive tract: Secondary | ICD-10-CM

## 2023-06-30 DIAGNOSIS — K5730 Diverticulosis of large intestine without perforation or abscess without bleeding: Secondary | ICD-10-CM | POA: Diagnosis not present

## 2023-06-30 DIAGNOSIS — N281 Cyst of kidney, acquired: Secondary | ICD-10-CM | POA: Diagnosis not present

## 2023-06-30 DIAGNOSIS — E876 Hypokalemia: Secondary | ICD-10-CM | POA: Diagnosis not present

## 2023-06-30 DIAGNOSIS — D1803 Hemangioma of intra-abdominal structures: Secondary | ICD-10-CM | POA: Diagnosis not present

## 2023-06-30 DIAGNOSIS — K922 Gastrointestinal hemorrhage, unspecified: Secondary | ICD-10-CM | POA: Diagnosis not present

## 2023-06-30 LAB — CBC
HCT: 35.3 % — ABNORMAL LOW (ref 36.0–46.0)
HCT: 34.2 % — ABNORMAL LOW (ref 36.0–46.0)
Hemoglobin: 11.9 g/dL — ABNORMAL LOW (ref 12.0–15.0)
Hemoglobin: 11.3 g/dL — ABNORMAL LOW (ref 12.0–15.0)
MCH: 28.4 pg (ref 26.0–34.0)
MCH: 28 pg (ref 26.0–34.0)
MCHC: 33.7 g/dL (ref 30.0–36.0)
MCHC: 33 g/dL (ref 30.0–36.0)
MCV: 84.2 fL (ref 80.0–100.0)
MCV: 84.9 fL (ref 80.0–100.0)
Platelets: 233 10*3/uL (ref 150–400)
Platelets: 245 10*3/uL (ref 150–400)
RBC: 4.19 MIL/uL (ref 3.87–5.11)
RBC: 4.03 MIL/uL (ref 3.87–5.11)
RDW: 13.6 % (ref 11.5–15.5)
RDW: 13.5 % (ref 11.5–15.5)
WBC: 11.1 10*3/uL — ABNORMAL HIGH (ref 4.0–10.5)
WBC: 9.9 10*3/uL (ref 4.0–10.5)
nRBC: 0 % (ref 0.0–0.2)
nRBC: 0 % (ref 0.0–0.2)

## 2023-06-30 LAB — MAGNESIUM: Magnesium: 1.6 mg/dL — ABNORMAL LOW (ref 1.7–2.4)

## 2023-06-30 LAB — BASIC METABOLIC PANEL WITH GFR
Anion gap: 10 (ref 5–15)
BUN: 13 mg/dL (ref 8–23)
CO2: 26 mmol/L (ref 22–32)
Calcium: 8.6 mg/dL — ABNORMAL LOW (ref 8.9–10.3)
Chloride: 103 mmol/L (ref 98–111)
Creatinine, Ser: 0.63 mg/dL (ref 0.44–1.00)
GFR, Estimated: >60 mL/min (ref >60)
Glucose, Bld: 124 mg/dL — ABNORMAL HIGH (ref 70–99)
Potassium: 3.6 mmol/L (ref 3.5–5.1)
Sodium: 139 mmol/L (ref 135–145)

## 2023-06-30 LAB — HEMOGLOBIN AND HEMATOCRIT, BLOOD
HCT: 32.6 % — ABNORMAL LOW (ref 36.0–46.0)
HCT: 33.3 % — ABNORMAL LOW (ref 36.0–46.0)
Hemoglobin: 11 g/dL — ABNORMAL LOW (ref 12.0–15.0)
Hemoglobin: 11.1 g/dL — ABNORMAL LOW (ref 12.0–15.0)

## 2023-06-30 MED ORDER — PROCHLORPERAZINE EDISYLATE 10 MG/2ML IJ SOLN: 5.0000 mg | Freq: Four times a day (QID) | INTRAMUSCULAR | Status: DC | PRN

## 2023-06-30 MED ORDER — HYDROCORTISONE ACETATE 25 MG RE SUPP
25.0000 mg | Freq: Every day | RECTAL | Status: DC
  Administered 2023-06-30 – 2023-07-01 (×2): 25 mg via RECTAL
  Filled 2023-06-30 (×2): qty 1

## 2023-06-30 MED ORDER — POTASSIUM CHLORIDE CRYS ER 20 MEQ PO TBCR
40.0000 meq | EXTENDED_RELEASE_TABLET | Freq: Once | ORAL | Status: AC
  Administered 2023-06-30: 40 meq via ORAL
  Filled 2023-06-30: qty 2

## 2023-06-30 MED ORDER — IOHEXOL 350 MG/ML SOLN
100.0000 mL | Freq: Once | INTRAVENOUS | Status: AC | PRN
  Administered 2023-06-30: 100 mL via INTRAVENOUS

## 2023-06-30 MED ORDER — ACETAMINOPHEN 325 MG PO TABS: 650.0000 mg | ORAL_TABLET | Freq: Four times a day (QID) | ORAL | Status: DC | PRN

## 2023-06-30 MED ORDER — POTASSIUM CHLORIDE 10 MEQ/100ML IV SOLN
10.0000 meq | INTRAVENOUS | Status: AC
  Administered 2023-06-30 (×2): 10 meq via INTRAVENOUS
  Filled 2023-06-30 (×2): qty 100

## 2023-06-30 MED ORDER — SODIUM CHLORIDE 0.9% FLUSH: 10.0000 mL | INTRAVENOUS | Status: DC | PRN

## 2023-06-30 MED ORDER — AMOXICILLIN-POT CLAVULANATE 875-125 MG PO TABS
1.0000 | ORAL_TABLET | Freq: Two times a day (BID) | ORAL | Status: DC
  Administered 2023-06-30 – 2023-07-01 (×2): 1 via ORAL
  Filled 2023-06-30 (×2): qty 1

## 2023-06-30 MED ORDER — ACETAMINOPHEN 650 MG RE SUPP: 650.0000 mg | Freq: Four times a day (QID) | RECTAL | Status: DC | PRN

## 2023-06-30 MED ORDER — SODIUM CHLORIDE 0.9% FLUSH
3.0000 mL | Freq: Two times a day (BID) | INTRAVENOUS | Status: DC
  Administered 2023-06-30 – 2023-07-01 (×4): 3 mL via INTRAVENOUS

## 2023-06-30 MED ORDER — SIMVASTATIN 20 MG PO TABS
10.0000 mg | ORAL_TABLET | ORAL | Status: DC
  Administered 2023-06-30: 10 mg via ORAL
  Filled 2023-06-30: qty 1

## 2023-06-30 MED ORDER — LACTATED RINGERS IV SOLN: INTRAVENOUS | Status: AC

## 2023-06-30 MED ORDER — MAGNESIUM SULFATE 2 GM/50ML IV SOLN
2.0000 g | Freq: Once | INTRAVENOUS | Status: AC
  Administered 2023-06-30: 2 g via INTRAVENOUS
  Filled 2023-06-30: qty 50

## 2023-06-30 NOTE — Progress Notes (Signed)
 PROGRESS NOTE  Shayma Pfefferle NWG:[id number] DOB: 10-20-1945   PCP: Donnie Galea, MD  Patient is from: Home.  Independently ambulates at baseline.  DOA: 06/29/2023 LOS: 0  Chief complaints Chief Complaint  Patient presents with   Hemorrhoids     Brief Narrative / Interim history: 78 year old F with PMH of diverticulosis, hemorrhoids, PUD, HTN and HLD presenting with painless hematochezia, and admitted with possible acute diverticular bleed.  Reportedly has similar episode when she was admitted in February 2025.  Not on antiplatelet or anticoagulation.  Hgb 12.3 (was 14.8 on 6/10).  She also had hypokalemia to 2.8.  CT angio with sigmoid wall thickening/edema.  GI altered and she was admitted for further evaluation and management.   Subjective: Seen and examined earlier this morning.  No major events overnight or this morning.  Continues to endorse hematochezia with some blood clots.  Not symptomatic.  Denies chest pain, shortness of breath, lightheadedness, nausea, vomiting or abdominal pain.  Objective: Vitals:   06/29/23 1954 06/29/23 2351 06/30/23 0831 06/30/23 1452  BP: (!) 152/66 (!) 169/80 127/65 127/70  Pulse: 64 72 66 86  Resp: 17 17  18   Temp: 97.9 F (36.6 C) 98.2 F (36.8 C) 98.1 F (36.7 C) 98.1 F (36.7 C)  TempSrc: Oral Oral Oral   SpO2: 100% 98% 97% 99%  Weight:      Height:        Examination:  GENERAL: No apparent distress.  Nontoxic. HEENT: MMM.  Vision and hearing grossly intact.  NECK: Supple.  No apparent JVD.  RESP:  No IWOB.  Fair aeration bilaterally. CVS:  RRR. Heart sounds normal.  ABD/GI/GU: BS+. Abd soft, NTND.  MSK/EXT:  Moves extremities. No apparent deformity. No edema.  SKIN: no apparent skin lesion or wound NEURO: AA.  Oriented appropriately.  No apparent focal neuro deficit. PSYCH: Calm. Normal affect.   Consultants:  Gastroenterology  Procedures: None  Microbiology summarized: None  Assessment and plan: Acute blood loss  anemia due to painless lower GI bleed likely diverticular: Continues to have hematochezia.  Recent Labs    03/09/23 0746 03/09/23 1539 03/09/23 1926 03/10/23 0521 03/18/23 1152 04/28/23 1054 06/28/23 1007 06/29/23 1643 06/30/23 0049 06/30/23 0756  HGB 8.8* 9.9* 10.4* 9.1* 10.4* 12.8 14.8 12.3 11.9* 11.3*  -Appreciate GI help-continue clears, and monitoring H&H -Stat CT angio if profuse bleed. -Verbally consented for blood transfusion if indicated  Abnormal CT abdomen: Showed sigmoid wall thickening.  Has no pain or tenderness on exam. - May need further evaluation with colonoscopy  Hypokalemia/hypomagnesemia -Monitor replenish K and Mg as appropriate  History of H. pylori/PUD in 2019  Essential hypertension: Normotensive - Continue holding home amlodipine  and indapamide   Hyperlipidemia - Continue Zocor   Hemorrhoids - Continue Anusol  cream  Class I obesity Body mass index is 31.49 kg/m.           DVT prophylaxis:  SCDs Start: 06/30/23 0022  Code Status: Full code Family Communication: None at bedside Level of care: Telemetry Medical Status is: Observation The patient will require care spanning > 2 midnights and should be moved to inpatient because: Acute blood loss anemia due to lower GI bleed/diverticular bleed   Final disposition: Home   35 minutes with more than 50% spent in reviewing records, counseling patient/family and coordinating care.   Sch Meds:  Scheduled Meds:  hydrocortisone   25 mg Rectal Daily   simvastatin   10 mg Oral QODAY   sodium chloride  flush  3 mL Intravenous Q12H  Continuous Infusions: PRN Meds:.acetaminophen  **OR** acetaminophen , prochlorperazine, sodium chloride  flush  Antimicrobials: Anti-infectives (From admission, onward)    None        I have personally reviewed the following labs and images: CBC: Recent Labs  Lab 06/28/23 1007 06/29/23 1643 06/30/23 0049 06/30/23 0756  WBC 8.9 10.2 11.1* 9.9   NEUTROABS 5.8 6.6  --   --   HGB 14.8 12.3 11.9* 11.3*  HCT 46.3* 36.7 35.3* 34.2*  MCV 88.0 85.0 84.2 84.9  PLT 206 261 233 245   BMP &GFR Recent Labs  Lab 06/28/23 1137 06/29/23 1643 06/30/23 0756  NA 143 141 139  K 3.2* 2.8* 3.6  CL 105 105 103  CO2 27 24 26   GLUCOSE 98 145* 124*  BUN 20 21 13   CREATININE 0.51 0.64 0.63  CALCIUM 9.4 9.3 8.6*  MG  --   --  1.6*   Estimated Creatinine Clearance: 45.4 mL/min (by C-G formula based on SCr of 0.63 mg/dL). Liver & Pancreas: Recent Labs  Lab 06/28/23 1137 06/29/23 1643  AST 15 14*  ALT 12 11  ALKPHOS 63 46  BILITOT 0.6 0.4  PROT 7.0 6.2*  ALBUMIN 3.9 3.3*   No results for input(s): LIPASE, AMYLASE in the last 168 hours. No results for input(s): AMMONIA in the last 168 hours. Diabetic: No results for input(s): HGBA1C in the last 72 hours. No results for input(s): GLUCAP in the last 168 hours. Cardiac Enzymes: No results for input(s): CKTOTAL, CKMB, CKMBINDEX, TROPONINI in the last 168 hours. No results for input(s): PROBNP in the last 8760 hours. Coagulation Profile: Recent Labs  Lab 06/28/23 1139 06/29/23 1643  INR 1.0 1.0   Thyroid  Function Tests: No results for input(s): TSH, T4TOTAL, FREET4, T3FREE, THYROIDAB in the last 72 hours. Lipid Profile: No results for input(s): CHOL, HDL, LDLCALC, TRIG, CHOLHDL, LDLDIRECT in the last 72 hours. Anemia Panel: No results for input(s): VITAMINB12, FOLATE, FERRITIN, TIBC, IRON, RETICCTPCT in the last 72 hours. Urine analysis:    Component Value Date/Time   COLORURINE YELLOW 03/09/2023 0347   APPEARANCEUR CLEAR 03/09/2023 0347   LABSPEC 1.026 03/09/2023 0347   PHURINE 5.0 03/09/2023 0347   GLUCOSEU NEGATIVE 03/09/2023 0347   HGBUR NEGATIVE 03/09/2023 0347   BILIRUBINUR NEGATIVE 03/09/2023 0347   BILIRUBINUR neg 04/20/2022 1007   KETONESUR NEGATIVE 03/09/2023 0347   PROTEINUR NEGATIVE 03/09/2023 0347    UROBILINOGEN 0.2 04/20/2022 1007   NITRITE NEGATIVE 03/09/2023 0347   LEUKOCYTESUR NEGATIVE 03/09/2023 0347   Sepsis Labs: Invalid input(s): PROCALCITONIN, LACTICIDVEN  Microbiology: No results found for this or any previous visit (from the past 240 hours).  Radiology Studies: CT ANGIO GI BLEED Result Date: 06/30/2023 CLINICAL DATA:  Pain with GI bleeding. EXAM: CTA ABDOMEN AND PELVIS WITHOUT AND WITH CONTRAST TECHNIQUE: Multidetector CT imaging of the abdomen and pelvis was performed using the standard protocol during bolus administration of intravenous contrast. Multiplanar reconstructed images and MIPs were obtained and reviewed to evaluate the vascular anatomy. RADIATION DOSE REDUCTION: This exam was performed according to the departmental dose-optimization program which includes automated exposure control, adjustment of the mA and/or kV according to patient size and/or use of iterative reconstruction technique. CONTRAST:  OMNIPAQUE  IOHEXOL  350 MG/ML SOLN COMPARISON:  03/09/2023. FINDINGS: VASCULAR Aorta: Normal caliber aorta without aneurysm, dissection, vasculitis or significant stenosis. Mild atherosclerotic calcification evident. Celiac: Patent without evidence of aneurysm, dissection, vasculitis or significant stenosis. SMA: Patent without evidence of aneurysm, dissection, vasculitis or significant stenosis. Renals: Both renal arteries  are patent without evidence of aneurysm, dissection, vasculitis, fibromuscular dysplasia or significant stenosis. IMA: Patent without evidence of aneurysm, dissection, vasculitis or significant stenosis. Inflow: Patent without evidence of aneurysm, dissection, vasculitis or significant stenosis. Proximal Outflow: Bilateral common femoral and visualized portions of the superficial and profunda femoral arteries are patent without evidence of aneurysm, dissection, vasculitis or significant stenosis. Veins: No obvious venous abnormality within the  limitations of this arterial phase study. Review of the MIP images confirms the above findings. NON-VASCULAR Lower chest: No acute findings. 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 There is no evidence for gallstones, gallbladder wall thickening, or pericholecystic fluid. No intrahepatic or extrahepatic biliary dilation. Pancreas: No focal mass lesion. No dilatation of the main duct. No intraparenchymal cyst. No peripancreatic edema. Spleen: No splenomegaly. No suspicious focal mass lesion. Adrenals/Urinary Tract: No adrenal nodule or mass. 7 mm right adrenal nodule stable since CT 12/12/2019 consistent with benign etiology. No followup imaging is recommended. 11.1 cm exophytic simple cyst noted upper pole right kidney, similar to prior. Central sinus cysts noted in both kidneys. Tiny well-defined homogeneous low-density lesions in both kidneys are too small to characterize but are statistically most likely benign and probably cysts. No followup imaging is recommended. No evidence for hydroureter. The urinary bladder appears normal for the degree of distention. Stomach/Bowel: Prominent arterial anatomy identified along the lateral wall of the gastric body (see axial 77/10) with no accumulation of contrast material in this region on delayed imaging. This appears to represent a vessel on arterial phase imaging and could be a vascular malformation. No evidence for active contrast extravasation into the duodenum. No evidence for contrast extravasation in the small bowel. Layering high density material is seen in the dependent cecum (866/11) but this was present on precontrast imaging compatible with ingested contents. No evidence for active contrast extravasation into the lumen of the colon. The patient does have diffuse diverticular disease throughout the colon and a short segment of colonic wall thickening with pericolonic edema/inflammation in the  proximal to mid sigmoid colon (image 165/10). Imaging features most suggestive of diverticulitis. No evidence for contrast extravasation in this region of inflammation. Of note, the descending colon does not lie in the paracolic gutter but courses from the splenic flexure medially into the central pelvis. Lymphatic: There is no gastrohepatic or hepatoduodenal ligament lymphadenopathy. No retroperitoneal or mesenteric lymphadenopathy. No pelvic sidewall lymphadenopathy. Reproductive: There is no adnexal mass.  Hysterectomy. Other: No intraperitoneal free fluid. Musculoskeletal: No worrisome lytic or sclerotic osseous abnormality. IMPRESSION: 1. No evidence for active contrast extravasation into the lumen of the bowel to suggest hemorrhage. 2. Diffuse diverticular disease throughout the colon with short segment of colonic wall thickening and pericolonic edema/inflammation in the proximal to mid sigmoid colon. Imaging features most suggestive of diverticulitis. No evidence for contrast extravasation in this region of inflammation. No findings of perforation or abscess. 3. Prominent arterial anatomy along the lateral wall of the gastric body with no accumulation of contrast material in this region on delayed imaging. This appears to represent a vessel on arterial phase imaging and could be a vascular malformation. 4. 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 5. Stable 7 mm right adrenal nodule consistent with benign etiology. No followup imaging is recommended. 6.  Aortic Atherosclerosis (ICD10-I70.0). Electronically Signed   By: Donnal Fusi M.D.   On: 06/30/2023 06:06      Onetta Spainhower T. Lawren Sexson [facility]  If 7PM-7AM, please contact night-coverage [email or web address] 06/30/2023, 3:00 PM

## 2023-06-30 NOTE — Consult Note (Addendum)
 Consultation Note   Referring Provider:  [facility] PCP: Donnie Galea, MD Primary Gastroenterologist:   Dr. Baldomero Bone [facility])   Reason for Consultation:  Lower GI bleeding DOA: 06/29/2023         Hospital Day: 2   Attending physician's note  I personally saw the patient and performed a substantive portion of this encounter (>50% time spent), including a complete performance of at least one of the key components (MDM, Hx and/or Exam), in conjunction with the APP.  I agree with the APP's note, impression, and recommendations with additional input as follows.     78 year old female with recurrent painless hematochezia likely diverticular hemorrhage, did have findings of sigmoid wall thickening, cannot exclude diverticulitis still she is not tender on exam Will empirically do short course of oral Augmentin  875 mg twice daily for 7 days Will plan for office follow-up visit in 3 to 4 weeks and plan for colonoscopy for evaluation abnormal area in sigmoid colon to exclude neoplastic lesion  Advance diet to soft diet as tolerated If hemoglobin remains stable with no further bleeding, okay to discharge patient home from GI standpoint  The patient was provided an opportunity to ask questions and all were answered. The patient agreed with the plan and demonstrated an understanding of the instructions.  Patricia Hoover , MD [phone]    ASSESSMENT    78 yo female with recurrent painless hematochezia, probably diverticular hemorrhage. CT angio negative for active bleeding. She has a history of recurrent lower GI bleeds ( last one in Feb 2025). She is hemodynamically stable.    Acute blood loss anemia.  Hgb down from 14.8 to 11.3  Sigmoid wall thickening / edema on CT angio. Could be diverticulitis but she hasn't had any pain and isn't tender on exam.   Hypokalemia Resolved  History of H.pylori / PUD in 2019  See PMH for additional  history   PLAN:   --Trend H/H --Will give clear liquids -- Could need colonoscopy ( or flex sig) at some point to evaluate the area of colonic inflammation    HPI   78 y.o. year old female with a medical history including but not limited to diverticulosis / diverticular bleeds, iron deficiency anemia, colon polyps, hemorrhoids s/p banding, H.pylori , PUD, HTN, HLD.   Brief GI history:  Patricia Hoover has a history of recurrent diverticular bleeding. Most recent hospitalization was at [facility] in Feb 2025. She had a significant drop in hgb during that admission. CT angio was negative for active bleeding. Bleeding stopped spontaneously.  She had an office follow up with Dr. Baldomero Bone on 06/02/23  Interval History:  Patricia Hoover was seen at [facility] on 6/10 for GI bleeding. Volume of blood loss felt to be low. Hgb was 14.8. No blood on DRE. She was discharged home. No further bleeding but then yesterday had recurrent hematochezia and came to [facility] ED.  She describes the blood as dark red. She is not anticoagulated, no NSAID use.   ED labs notable for K+ 2.8, Hgb 12.3. Normal renal function.  CT angio negative for active bleeding but did show diffuse diverticular disease throughout the colon and a short segment of colonic wall thickening  and pericolonic edema /  inflammation in proximal to mid sigmoid colon possibly representing diverticulitis. Also , possibly a vascular malformation in stomach.   Today her hgb is down to 11.3. She had an episode of hematochezia x1 this am.    Labs and Imaging:  Recent Labs    06/28/23 1137 06/29/23 1643  PROT 7.0 6.2*  ALBUMIN 3.9 3.3*  AST 15 14*  ALT 12 11  ALKPHOS 63 46  BILITOT 0.6 0.4   Recent Labs    06/29/23 1643 06/30/23 0049 06/30/23 0756  WBC 10.2 11.1* 9.9  HGB 12.3 11.9* 11.3*  HCT 36.7 35.3* 34.2*  MCV 85.0 84.2 84.9  PLT 261 233 245   Recent Labs    06/28/23 1137 06/29/23 1643 06/30/23 0756  NA 143 141 139  K 3.2* 2.8* 3.6  CL 105 105 103   CO2 27 24 26   GLUCOSE 98 145* 124*  BUN 20 21 13   CREATININE 0.51 0.64 0.63  CALCIUM 9.4 9.3 8.6*     CT ANGIO GI BLEED CLINICAL DATA:  Pain with GI bleeding.  EXAM: CTA ABDOMEN AND PELVIS WITHOUT AND WITH CONTRAST  TECHNIQUE: Multidetector CT imaging of the abdomen and pelvis was performed using the standard protocol during bolus administration of intravenous contrast. Multiplanar reconstructed images and MIPs were obtained and reviewed to evaluate the vascular anatomy.  RADIATION DOSE REDUCTION: This exam was performed according to the departmental dose-optimization program which includes automated exposure control, adjustment of the mA and/or kV according to patient size and/or use of iterative reconstruction technique.  CONTRAST:  OMNIPAQUE  IOHEXOL  350 MG/ML SOLN  COMPARISON:  03/09/2023.  FINDINGS: VASCULAR  Aorta: Normal caliber aorta without aneurysm, dissection, vasculitis or significant stenosis. Mild atherosclerotic calcification evident.  Celiac: Patent without evidence of aneurysm, dissection, vasculitis or significant stenosis.  SMA: Patent without evidence of aneurysm, dissection, vasculitis or significant stenosis.  Renals: Both renal arteries are patent without evidence of aneurysm, dissection, vasculitis, fibromuscular dysplasia or significant stenosis.  IMA: Patent without evidence of aneurysm, dissection, vasculitis or significant stenosis.  Inflow: Patent without evidence of aneurysm, dissection, vasculitis or significant stenosis.  Proximal Outflow: Bilateral common femoral and visualized portions of the superficial and profunda femoral arteries are patent without evidence of aneurysm, dissection, vasculitis or significant stenosis.  Veins: No obvious venous abnormality within the limitations of this arterial phase study.  Review of the MIP images confirms the above findings.  NON-VASCULAR  Lower chest: No acute  findings.  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 There is no evidence for gallstones, gallbladder wall thickening, or pericholecystic fluid. No intrahepatic or extrahepatic biliary dilation.  Pancreas: No focal mass lesion. No dilatation of the main duct. No intraparenchymal cyst. No peripancreatic edema.  Spleen: No splenomegaly. No suspicious focal mass lesion.  Adrenals/Urinary Tract: No adrenal nodule or mass. 7 mm right adrenal nodule stable since CT 12/12/2019 consistent with benign etiology. No followup imaging is recommended. 11.1 cm exophytic simple cyst noted upper pole right kidney, similar to prior. Central sinus cysts noted in both kidneys. Tiny well-defined homogeneous low-density lesions in both kidneys are too small to characterize but are statistically most likely benign and probably cysts. No followup imaging is recommended. No evidence for hydroureter. The urinary bladder appears normal for the degree of distention.  Stomach/Bowel: Prominent arterial anatomy identified along the lateral wall of the gastric body (see axial 77/10) with no accumulation of contrast material in this region on delayed imaging. This appears  to represent a vessel on arterial phase imaging and could be a vascular malformation. No evidence for active contrast extravasation into the duodenum. No evidence for contrast extravasation in the small bowel. Layering high density material is seen in the dependent cecum (866/11) but this was present on precontrast imaging compatible with ingested contents. No evidence for active contrast extravasation into the lumen of the colon. The patient does have diffuse diverticular disease throughout the colon and a short segment of colonic wall thickening with pericolonic edema/inflammation in the proximal to mid sigmoid colon (image 165/10). Imaging features most suggestive of  diverticulitis. No evidence for contrast extravasation in this region of inflammation. Of note, the descending colon does not lie in the paracolic gutter but courses from the splenic flexure medially into the central pelvis.  Lymphatic: There is no gastrohepatic or hepatoduodenal ligament lymphadenopathy. No retroperitoneal or mesenteric lymphadenopathy. No pelvic sidewall lymphadenopathy.  Reproductive: There is no adnexal mass.  Hysterectomy.  Other: No intraperitoneal free fluid.  Musculoskeletal: No worrisome lytic or sclerotic osseous abnormality.  IMPRESSION: 1. No evidence for active contrast extravasation into the lumen of the bowel to suggest hemorrhage. 2. Diffuse diverticular disease throughout the colon with short segment of colonic wall thickening and pericolonic edema/inflammation in the proximal to mid sigmoid colon. Imaging features most suggestive of diverticulitis. No evidence for contrast extravasation in this region of inflammation. No findings of perforation or abscess. 3. Prominent arterial anatomy along the lateral wall of the gastric body with no accumulation of contrast material in this region on delayed imaging. This appears to represent a vessel on arterial phase imaging and could be a vascular malformation. 4. 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 5. Stable 7 mm right adrenal nodule consistent with benign etiology. No followup imaging is recommended. 6.  Aortic Atherosclerosis (ICD10-I70.0).  Electronically Signed   By: Donnal Fusi M.D.   On: 06/30/2023 06:06    Pertinent GI Studies   March 2019 EGD for follow up on PUD  - Normal esophagus. - Z-line irregular, 38 cm from the incisors. - Erythematous mucosa in the antrum. Biopsied. - Duodenal deformity (characterized by mild angulation at healed ulcer site). Biopsied. - Normal duodenal bulb, second portion of the duodenum and examined duodenum. -  Biopsies were obtained in the gastric body, at the incisura and in the gastric antrum.  Feb 2019 EGD and colonoscopy for hematochezia - Normal esophagus. - Z-line regular. - Gastric mucosal atrophy. - One non-bleeding duodenal ulcer with no stigmata of bleeding. This is unlikely to be the source of patient's hematochezia given her clinical presenation and lab findings. The non significant drop in her Hgb since admission is consistent with her hematochezia being from the hemorrhoids seen on her colonoscopy today. The ulcer is likely due to her NSAID use. Testing for H. Pylori with biopsies pending. - Small hiatal hernia. - Biopsies were obtained in the gastric body, at the incisura and in the gastric antrum.  Colonoscopy: - Preparation of the colon was fair. - Non-thrombosed external hemorrhoids found on perianal exam. One of these was large but non bleeding. - Diverticulosis in the sigmoid colon, in the descending colon and in the ascending colon. - The rectum, sigmoid colon, descending colon, transverse colon, ascending colon and cecum are normal. - Non-bleeding internal hemorrhoids. - No specimens collected. - The hemorrhoids were the likely source of her hematochezia as evidenced by her clinical history and labs showing a non-significant drop in her Hgb  from baseline.    Past Medical History:  Diagnosis Date   Angioedema 06/15/2017   Arrhythmia    possible hx, resolved prev   Blood transfusion without reported diagnosis 1972   had reaction; had to stop   Carpal tunnel syndrome    Cataract 2019   resolved with surgery   Duodenal ulcer    H/O exercise stress test 2012   normal   Heart murmur    as child   Hematochezia    High cholesterol    History of kidney stones    Hx of colonic polyps    Hypertension    Osteoporosis 2010   t score - 3.9   Peptic ulcer of duodenum    Renal stones    Vitamin D  deficiency 03/01/2021    Past Surgical History:  Procedure Laterality Date    ABDOMINAL HYSTERECTOMY  1972   for endometriosis   CATARACT EXTRACTION W/ INTRAOCULAR LENS IMPLANT Right 06/2017   CATARACT EXTRACTION W/ INTRAOCULAR LENS IMPLANT Left 07/2017   COLONOSCOPY Left 03/02/2017   Procedure: COLONOSCOPY;  Surgeon: Irby Mannan, MD;  Location: [facility] ENDOSCOPY;  Service: Endoscopy;  Laterality: Left;   COLONOSCOPY WITH PROPOFOL  N/A 05/04/2016   Procedure: COLONOSCOPY WITH PROPOFOL ;  Surgeon: Luke Salaam, MD;  Location: [facility] ENDOSCOPY;  Service: Endoscopy;  Laterality: N/A;   ESOPHAGOGASTRODUODENOSCOPY Left 03/02/2017   Procedure: ESOPHAGOGASTRODUODENOSCOPY (EGD);  Surgeon: Irby Mannan, MD;  Location[facility] ENDOSCOPY;  Service: Endoscopy;  Laterality: Left;   ESOPHAGOGASTRODUODENOSCOPY (EGD) WITH PROPOFOL  N/A 04/08/2017   Procedure: ESOPHAGOGASTRODUODENOSCOPY (EGD) WITH PROPOFOL ;  Surgeon: Irby Mannan, MD;  Location: [facility] ENDOSCOPY;  Service: Endoscopy;  Laterality: N/A;   LITHOTRIPSY     for kidney stone x5   LITHOTRIPSY  09/2017    Family History  Problem Relation Age of Onset   Heart disease Mother    Renal Disease Mother    Heart failure Mother    Colon cancer Neg Hx    Breast cancer Neg Hx     Prior to Admission medications   Medication Sig Start Date End Date Taking? Authorizing Provider  amLODipine  (NORVASC ) 2.5 MG tablet Take 1 tablet (2.5 mg total) by mouth daily. 03/04/23  Yes Donnie Galea, MD  Ascorbic Acid (VITAMIN C GUMMIE PO) Take 2 each by mouth daily.   Yes [provider]  Cholecalciferol  (VITAMIN D3) 50 MCG (2000 UT) capsule Take 2 capsules (4,000 Units total) by mouth daily. 03/01/21  Yes Donnie Galea, MD  indapamide  (LOZOL ) 2.5 MG tablet Take 1 tablet (2.5 mg total) by mouth daily. 03/04/23  Yes Donnie Galea, MD  Potassium Citrate  15 MEQ (1620 MG) TBCR Take 1 tablet by mouth daily. 03/18/23  Yes Donnie Galea, MD  simvastatin  (ZOCOR ) 10 MG tablet Take 1 tablet (10 mg total) by mouth every other day.  03/04/23  Yes Donnie Galea, MD  hydrocortisone  (ANUSOL -HC) 25 MG suppository Place 1 suppository (25 mg total) rectally daily for 5 days. Patient not taking: Reported on 06/29/2023 06/28/23 07/03/23  Lubertha Rush, MD  Iron-FA-B Cmp-C-Biot-Probiotic (FUSION PLUS) CAPS Take 1 capsule by mouth every other day. Patient not taking: Reported on 06/29/2023 06/02/23   Selena Daily, MD    Current Facility-Administered Medications  Medication Dose Route Frequency Provider Last Rate Last Admin   acetaminophen  (TYLENOL ) tablet 650 mg  650 mg Oral Q6H PRN Opyd, Timothy S, MD       Or   acetaminophen  (TYLENOL ) suppository 650 mg  650 mg Rectal Q6H PRN Opyd, Timothy S, MD       lactated ringers  infusion   Intravenous Continuous Opyd, Timothy S, MD 75 mL/hr at 06/30/23 0653 Infusion Verify at 06/30/23 0653   prochlorperazine (COMPAZINE) injection 5 mg  5 mg Intravenous Q6H PRN Opyd, Santana Cue, MD       simvastatin  (ZOCOR ) tablet 10 mg  10 mg Oral QODAY Opyd, Timothy S, MD       sodium chloride  flush (NS) 0.9 % injection 10-40 mL  10-40 mL Intracatheter PRN Opyd, Timothy S, MD       sodium chloride  flush (NS) 0.9 % injection 3 mL  3 mL Intravenous Q12H Opyd, Timothy S, MD   3 mL at 06/30/23 0245    Allergies as of 06/29/2023 - Review Complete 06/29/2023  Allergen Reaction Noted   Ace inhibitors Swelling 06/13/2017   Angiotensin receptor blockers  06/14/2017   Aspirin Other (See Comments) 03/15/2017   Bisphosphonates  08/18/2012   Nsaids  03/15/2017   Prolia [denosumab]  06/21/2013    Social History   Socioeconomic History   Marital status: Widowed    Spouse name: Not on file   Number of children: Not on file   Years of education: Not on file   Highest education level: Not on file  Occupational History   Occupation: Retired    Comment: Product manager  Tobacco Use   Smoking status: Former    Current packs/day: 0.00    Types: Cigarettes    Quit date: 01/19/1991     Years since quitting: 32.4   Smokeless tobacco: Never  Vaping Use   Vaping status: Never Used  Substance and Sexual Activity   Alcohol use: No    Comment: rare wine   Drug use: No   Sexual activity: Not on file  Other Topics Concern   Not on file  Social History Narrative   Education:  1 year Business School   Retired back to [employer] after working in [employer]   Widowed '93   2 kids   Social Drivers of Corporate investment banker Strain: Low Risk  (03/15/2023)   Overall Financial Resource Strain (CARDIA)    Difficulty of Paying Living Expenses: Not hard at all  Food Insecurity: No Food Insecurity (06/30/2023)   Hunger Vital Sign    Worried About Running Out of Food in the Last Year: Never true    Ran Out of Food in the Last Year: Never true  Transportation Needs: No Transportation Needs (06/30/2023)   PRAPARE - Administrator, Civil Service (Medical): No    Lack of Transportation (Non-Medical): No  Physical Activity: Insufficiently Active (03/15/2023)   Exercise Vital Sign    Days of Exercise per Week: 2 days    Minutes of Exercise per Session: 30 min  Stress: Stress Concern Present (03/15/2023)   [employer] of Occupational Health - Occupational Stress Questionnaire    Feeling of Stress : To some extent  Social Connections: Moderately Integrated (06/30/2023)   Social Connection and Isolation Panel    Frequency of Communication with Friends and Family: Twice a week    Frequency of Social Gatherings with Friends and Family: Twice a week    Attends Religious Services: More than 4 times per year    Active Member of [employer] or Organizations: Yes    Attends Banker Meetings: 1 to 4 times per year    Marital Status:  Widowed  Intimate Partner Violence: Not At Risk (06/30/2023)   Humiliation, Afraid, Rape, and Kick questionnaire    Fear of Current or Ex-Partner: No    Emotionally Abused: No    Physically Abused: No    Sexually Abused: No      Code Status   Code Status: Full Code  Review of Systems: All systems reviewed and negative except where noted in HPI.  Physical Exam: Vital signs in last 24 hours: Temp:  [97.9 F (36.6 C)-98.4 F (36.9 C)] 98.1 F (36.7 C) (06/12 0831) Pulse Rate:  [64-79] 66 (06/12 0831) Resp:  [16-17] 17 (06/11 2351) BP: (127-169)/(65-100) 127/65 (06/12 0831) SpO2:  [97 %-100 %] 97 % (06/12 0831) Weight:  [16 kg] 66 kg (06/11 1622)    General:  Pleasant female in NAD Psych:  Cooperative. Normal mood and affect Eyes: Pupils equal Ears:  Normal auditory acuity Nose: No deformity, discharge or lesions Neck:  Supple, no masses felt Lungs:  Clear to auscultation.  Heart:  Regular rate, regular rhythm.  Abdomen:  Soft, nondistended, nontender, active bowel sounds, no masses felt Rectal :  Deferred Msk: Symmetrical without gross deformities.  Neurologic:  Alert, oriented, grossly normal neurologically Extremities : No edema Skin:  Intact without significant lesions.    Intake/Output from previous day: 06/11 0701 - 06/12 0700 In: 398.8 [I.V.:298.8; IV Piggyback:100] Out: -  Intake/Output this shift:  No intake/output data recorded.   Mai Schwalbe, NP-C   06/30/2023, 9:38 AM

## 2023-06-30 NOTE — Plan of Care (Signed)

## 2023-06-30 NOTE — Care Management Obs Status (Signed)
 MEDICARE OBSERVATION STATUS NOTIFICATION   Patient Details  Name: Patricia Hoover MRN: [id number] Date of Birth: [birth year]   Medicare Observation Status Notification Given:  Yes    Cindia Crease, RN 06/30/2023, 1:54 PM

## 2023-06-30 NOTE — H&P (Signed)
 History and Physical    Patricia Hoover:[id number] DOB: [birth year] DOA: 06/29/2023  PCP: Donnie Galea, MD   Patient coming from: Home   Chief Complaint: Rectal bleeding   HPI: Patricia Hoover is a pleasant 78 y.o. female with medical history significant for hypertension, hyperlipidemia, PUD, and diverticulosis who presents with multiple episodes of painless hematochezia.  Patient reports that she was in her usual state of health yesterday when she felt as though she was going to have diarrhea but passed dark blood and clots.  This has persisted today and she reports a total of 6 episodes now.  She denies any abdominal pain, nausea, vomiting, or lightheadedness.  She states that the symptoms are very similar to what she was experiencing when she was admitted in February.  ED Course: Upon arrival to the ED, patient is found to be afebrile and saturating well on room air with normal HR and stable BP.  Labs are most notable for potassium 2.8, normal creatinine, normal WBC, and hemoglobin 12.3.  GI (Dr. Dominic Friendly) was consulted by the ED physician, patient was given 40 mEq oral potassium, and CTA GI bleeding study was ordered.  Review of Systems:  All other systems reviewed and apart from HPI, are negative.  Past Medical History:  Diagnosis Date   Angioedema 06/15/2017   Arrhythmia    possible hx, resolved prev   Blood transfusion without reported diagnosis 1972   had reaction; had to stop   Carpal tunnel syndrome    Cataract 2019   resolved with surgery   Duodenal ulcer    H/O exercise stress test 2012   normal   Heart murmur    as child   Hematochezia    High cholesterol    History of kidney stones    Hx of colonic polyps    Hypertension    Osteoporosis 2010   t score - 3.9   Peptic ulcer of duodenum    Renal stones    Vitamin D  deficiency 03/01/2021    Past Surgical History:  Procedure Laterality Date   ABDOMINAL HYSTERECTOMY  1972   for endometriosis   CATARACT EXTRACTION  W/ INTRAOCULAR LENS IMPLANT Right 06/2017   CATARACT EXTRACTION W/ INTRAOCULAR LENS IMPLANT Left 07/2017   COLONOSCOPY Left 03/02/2017   Procedure: COLONOSCOPY;  Surgeon: Irby Mannan, MD;  Location: [facility] ENDOSCOPY;  Service: Endoscopy;  Laterality: Left;   COLONOSCOPY WITH PROPOFOL  N/A 05/04/2016   Procedure: COLONOSCOPY WITH PROPOFOL ;  Surgeon: Luke Salaam, MD;  Location: [facility] ENDOSCOPY;  Service: Endoscopy;  Laterality: N/A;   ESOPHAGOGASTRODUODENOSCOPY Left 03/02/2017   Procedure: ESOPHAGOGASTRODUODENOSCOPY (EGD);  Surgeon: Irby Mannan, MD;  Location: [facility] ENDOSCOPY;  Service: Endoscopy;  Laterality: Left;   ESOPHAGOGASTRODUODENOSCOPY (EGD) WITH PROPOFOL  N/A 04/08/2017   Procedure: ESOPHAGOGASTRODUODENOSCOPY (EGD) WITH PROPOFOL ;  Surgeon: Irby Mannan, MD;  Location: [facility] ENDOSCOPY;  Service: Endoscopy;  Laterality: N/A;   LITHOTRIPSY     for kidney stone x5   LITHOTRIPSY  09/2017    Social History:   reports that she quit smoking about 32 years ago. Her smoking use included cigarettes. She has never used smokeless tobacco. She reports that she does not drink alcohol and does not use drugs.  Allergies  Allergen Reactions   Ace Inhibitors Swelling   Angiotensin Receptor Blockers     Would avoid, h/o swelling with ACE prev   Aspirin Other (See Comments)    GI bleed   Bisphosphonates     GI side effects  Nsaids     GI bleed   Prolia [Denosumab]     GI upset    Family History  Problem Relation Age of Onset   Heart disease Mother    Renal Disease Mother    Heart failure Mother    Colon cancer Neg Hx    Breast cancer Neg Hx      Prior to Admission medications   Medication Sig Start Date End Date Taking? Authorizing Provider  amLODipine  (NORVASC ) 2.5 MG tablet Take 1 tablet (2.5 mg total) by mouth daily. 03/04/23  Yes Donnie Galea, MD  Ascorbic Acid (VITAMIN C GUMMIE PO) Take 2 each by mouth daily.   Yes [provider]  Cholecalciferol   (VITAMIN D3) 50 MCG (2000 UT) capsule Take 2 capsules (4,000 Units total) by mouth daily. 03/01/21  Yes Donnie Galea, MD  indapamide  (LOZOL ) 2.5 MG tablet Take 1 tablet (2.5 mg total) by mouth daily. 03/04/23  Yes Donnie Galea, MD  Potassium Citrate  15 MEQ (1620 MG) TBCR Take 1 tablet by mouth daily. 03/18/23  Yes Donnie Galea, MD  simvastatin  (ZOCOR ) 10 MG tablet Take 1 tablet (10 mg total) by mouth every other day. 03/04/23  Yes Donnie Galea, MD  hydrocortisone  (ANUSOL -HC) 25 MG suppository Place 1 suppository (25 mg total) rectally daily for 5 days. Patient not taking: Reported on 06/29/2023 06/28/23 07/03/23  Lubertha Rush, MD  Iron-FA-B Cmp-C-Biot-Probiotic (FUSION PLUS) CAPS Take 1 capsule by mouth every other day. Patient not taking: Reported on 06/29/2023 06/02/23   Selena Daily, MD    Physical Exam: Vitals:   06/29/23 1622 06/29/23 1622 06/29/23 1954 06/29/23 2351  BP:  (!) 149/100 (!) 152/66 (!) 169/80  Pulse:  79 64 72  Resp:  16 17 17   Temp:  98.4 F (36.9 C) 97.9 F (36.6 C) 98.2 F (36.8 C)  TempSrc:   Oral Oral  SpO2:  100% 100% 98%  Weight: 66 kg     Height: 4' 9 (1.448 m)       Constitutional: NAD, calm  Eyes: PERTLA, lids and conjunctivae normal ENMT: Mucous membranes are moist. Posterior pharynx clear of any exudate or lesions.   Neck: supple, no masses  Respiratory: no wheezing, no crackles. No accessory muscle use.  Cardiovascular: S1 & S2 heard, regular rate and rhythm. No extremity edema.   Abdomen: No distension, no tenderness, soft. Bowel sounds active.  Musculoskeletal: no clubbing / cyanosis. No joint deformity upper and lower extremities.   Skin: no significant rashes, lesions, ulcers. Warm, dry, well-perfused. Neurologic: CN 2-12 grossly intact. Moving all extremities. Alert and oriented.  Psychiatric: Pleasant. Cooperative.    Labs and Imaging on Admission: I have personally reviewed following labs and imaging studies  CBC: Recent  Labs  Lab 06/28/23 1007 06/29/23 1643  WBC 8.9 10.2  NEUTROABS 5.8 6.6  HGB 14.8 12.3  HCT 46.3* 36.7  MCV 88.0 85.0  PLT 206 261   Basic Metabolic Panel: Recent Labs  Lab 06/28/23 1137 06/29/23 1643  NA 143 141  K 3.2* 2.8*  CL 105 105  CO2 27 24  GLUCOSE 98 145*  BUN 20 21  CREATININE 0.51 0.64  CALCIUM 9.4 9.3   GFR: Estimated Creatinine Clearance: 45.4 mL/min (by C-G formula based on SCr of 0.64 mg/dL). Liver Function Tests: Recent Labs  Lab 06/28/23 1137 06/29/23 1643  AST 15 14*  ALT 12 11  ALKPHOS 63 46  BILITOT 0.6 0.4  PROT 7.0 6.2*  ALBUMIN 3.9 3.3*   No results for input(s): LIPASE, AMYLASE in the last 168 hours. No results for input(s): AMMONIA in the last 168 hours. Coagulation Profile: Recent Labs  Lab 06/28/23 1139 06/29/23 1643  INR 1.0 1.0   Cardiac Enzymes: No results for input(s): CKTOTAL, CKMB, CKMBINDEX, TROPONINI in the last 168 hours. BNP (last 3 results) No results for input(s): PROBNP in the last 8760 hours. HbA1C: No results for input(s): HGBA1C in the last 72 hours. CBG: No results for input(s): GLUCAP in the last 168 hours. Lipid Profile: No results for input(s): CHOL, HDL, LDLCALC, TRIG, CHOLHDL, LDLDIRECT in the last 72 hours. Thyroid  Function Tests: No results for input(s): TSH, T4TOTAL, FREET4, T3FREE, THYROIDAB in the last 72 hours. Anemia Panel: No results for input(s): VITAMINB12, FOLATE, FERRITIN, TIBC, IRON, RETICCTPCT in the last 72 hours. Urine analysis:    Component Value Date/Time   COLORURINE YELLOW 03/09/2023 0347   APPEARANCEUR CLEAR 03/09/2023 0347   LABSPEC 1.026 03/09/2023 0347   PHURINE 5.0 03/09/2023 0347   GLUCOSEU NEGATIVE 03/09/2023 0347   HGBUR NEGATIVE 03/09/2023 0347   BILIRUBINUR NEGATIVE 03/09/2023 0347   BILIRUBINUR neg 04/20/2022 1007   KETONESUR NEGATIVE 03/09/2023 0347   PROTEINUR NEGATIVE 03/09/2023 0347   UROBILINOGEN 0.2  04/20/2022 1007   NITRITE NEGATIVE 03/09/2023 0347   LEUKOCYTESUR NEGATIVE 03/09/2023 0347   Sepsis Labs: @LABRCNTIP (procalcitonin:4,lacticidven:4) )No results found for this or any previous visit (from the past 240 hours).   Radiological Exams on Admission: No results found.  Assessment/Plan   1. Acute lower GI bleeding  - Presents after multiple episodes of painless hematochezia and is found to be hemodynamically stable with initial Hgb 12.3 (was 14.8 one day ago)  - Continue bowel-rest, check CTA, trend H&H, transfuse if needed   2. Hypokalemia  - Replacing    3. HTN  - Treat as-needed only for now    4. HLD  - Zocor     DVT prophylaxis: SCDs  Code Status: Full  Level of Care: Level of care: Telemetry Medical Family Communication: Daughter at bedside  Disposition Plan:  Patient is from: Home  Anticipated d/c is to: Home  Anticipated d/c date is: Possibly as early as 6/12 or 07/01/23  Patient currently: Pending stable H&H  Consults called: GI  Admission status: Observation     Walton Guppy, MD [facility]  06/30/2023, 12:23 AM

## 2023-07-01 ENCOUNTER — Other Ambulatory Visit (HOSPITAL_COMMUNITY): Payer: Self-pay

## 2023-07-01 ENCOUNTER — Inpatient Hospital Stay: Admitting: Family Medicine

## 2023-07-01 DIAGNOSIS — I10 Essential (primary) hypertension: Secondary | ICD-10-CM | POA: Diagnosis not present

## 2023-07-01 DIAGNOSIS — E876 Hypokalemia: Secondary | ICD-10-CM | POA: Diagnosis not present

## 2023-07-01 DIAGNOSIS — R933 Abnormal findings on diagnostic imaging of other parts of digestive tract: Secondary | ICD-10-CM | POA: Diagnosis not present

## 2023-07-01 DIAGNOSIS — K922 Gastrointestinal hemorrhage, unspecified: Secondary | ICD-10-CM | POA: Diagnosis not present

## 2023-07-01 DIAGNOSIS — E785 Hyperlipidemia, unspecified: Secondary | ICD-10-CM | POA: Diagnosis not present

## 2023-07-01 LAB — RENAL FUNCTION PANEL
Albumin: 3 g/dL — ABNORMAL LOW (ref 3.5–5.0)
Anion gap: 12 (ref 5–15)
BUN: 11 mg/dL (ref 8–23)
CO2: 25 mmol/L (ref 22–32)
Calcium: 8.6 mg/dL — ABNORMAL LOW (ref 8.9–10.3)
Chloride: 104 mmol/L (ref 98–111)
Creatinine, Ser: 0.56 mg/dL (ref 0.44–1.00)
GFR, Estimated: >60 mL/min (ref >60)
Glucose, Bld: 145 mg/dL — ABNORMAL HIGH (ref 70–99)
Phosphorus: 2.9 mg/dL (ref 2.5–4.6)
Potassium: 3.4 mmol/L — ABNORMAL LOW (ref 3.5–5.1)
Sodium: 141 mmol/L (ref 135–145)

## 2023-07-01 LAB — CBC
HCT: 33.6 % — ABNORMAL LOW (ref 36.0–46.0)
Hemoglobin: 11.3 g/dL — ABNORMAL LOW (ref 12.0–15.0)
MCH: 28.7 pg (ref 26.0–34.0)
MCHC: 33.6 g/dL (ref 30.0–36.0)
MCV: 85.3 fL (ref 80.0–100.0)
Platelets: 252 10*3/uL (ref 150–400)
RBC: 3.94 MIL/uL (ref 3.87–5.11)
RDW: 13.5 % (ref 11.5–15.5)
WBC: 8.7 10*3/uL (ref 4.0–10.5)
nRBC: 0 % (ref 0.0–0.2)

## 2023-07-01 LAB — HEMOGLOBIN AND HEMATOCRIT, BLOOD
HCT: 33.6 % — ABNORMAL LOW (ref 36.0–46.0)
Hemoglobin: 11.3 g/dL — ABNORMAL LOW (ref 12.0–15.0)

## 2023-07-01 LAB — MAGNESIUM: Magnesium: 1.9 mg/dL (ref 1.7–2.4)

## 2023-07-01 MED ORDER — POTASSIUM CHLORIDE CRYS ER 20 MEQ PO TBCR
40.0000 meq | EXTENDED_RELEASE_TABLET | Freq: Once | ORAL | Status: AC
  Administered 2023-07-01: 40 meq via ORAL
  Filled 2023-07-01: qty 2

## 2023-07-01 MED ORDER — AMOXICILLIN-POT CLAVULANATE 875-125 MG PO TABS
1.0000 | ORAL_TABLET | Freq: Two times a day (BID) | ORAL | 0 refills | Status: AC
  Filled 2023-07-01: qty 14, 7d supply, fill #0

## 2023-07-01 NOTE — Discharge Summary (Signed)
 Physician Discharge Summary  M[place]field QMV:[id number] DOB: 1945/01/27 DOA: 06/29/2023  PCP: Donnie Galea, MD  Admit date: 06/29/2023 Discharge date: 07/01/23  Admitted From: Home Disposition: Home Recommendations for Outpatient Follow-up:  Follow up with PCP in 1 week GI to arrange outpatient follow-up for possible colonoscopy to assess sigmoid wall thickening Check CBC and CMP in 1 week Please follow up on the following pending results: None  Home Health: No need identified Equipment/Devices: No need identified  Discharge Condition: Stable CODE STATUS: Full code  Follow-up Information     Donnie Galea, MD. Schedule an appointment as soon as possible for a visit in 1 week(s).   Specialty: Family Medicine Contact information: [address] [place] [address] [phone]                 Hospital course 78 year old F with PMH of diverticulosis, hemorrhoids, PUD, HTN and HLD presenting with painless hematochezia, and admitted with possible acute diverticular bleed.  Reportedly has similar episode when she was admitted in February 2025.  Not on antiplatelet or anticoagulation.  Hgb 12.3 (was 14.8 on 6/10).  She also had hypokalemia to 2.8.  CT angio with sigmoid wall thickening/edema.  GI altered and she was admitted for further evaluation and management.   Patient has some intermittent small-volume blood in the stool but hemoglobin leveled off at 11.3 after initial drop.  Evaluated by GI and started on p.o. Augmentin  and advance to soft diet that she tolerated.  On the day of discharge, hemoglobin remained stable at 11.3 and she was cleared for discharge for outpatient follow-up.   See individual problem list below for more.   Problems addressed during this hospitalization Acute blood loss anemia due to painless lower GI bleed likely diverticular: Has had some blood in stool but hemoglobin remained stable after initial drop.  Tolerated soft  diet. Recent Labs    03/10/23 0521 03/18/23 1152 04/28/23 1054 06/28/23 1007 06/29/23 1643 06/30/23 0049 06/30/23 0756 06/30/23 1503 06/30/23 2305 07/01/23 0407  HGB 9.1* 10.4* 12.8 14.8 12.3 11.9* 11.3* 11.0* 11.1* 11.3*  -Cleared for discharge by GI on p.o. Augmentin  - GI to arrange outpatient follow-up for colonoscopy to assess sigmoid wall thickening   Abnormal CT abdomen: Showed sigmoid wall thickening.  Has no pain or tenderness on exam. - GI to arrange outpatient follow-up for colonoscopy to rule out malignancy   Hypokalemia/hypomagnesemia: Hypomagnesemia resolved.  Received p.o. KCl 40 mill equivalent before discharge   History of H. pylori/PUD in 2019   Essential hypertension: Normotensive - Continue home meds.   Hyperlipidemia - Continue Zocor    Hemorrhoids - Continue Anusol  cream   Class I obesity Body mass index is 31.49 kg/m.             Time spent 35 minutes  Vital signs Vitals:   06/30/23 1452 06/30/23 2029 07/01/23 0428 07/01/23 0807  BP: 127/70 122/75 (!) 100/55 (!) 108/92  Pulse: 86 69 76 81  Temp: 98.1 F (36.7 C) 97.8 F (36.6 C) 97.6 F (36.4 C) 98.5 F (36.9 C)  Resp: 18  18   Height:      Weight:      SpO2: 99% 99% 96% 99%  TempSrc:      BMI (Calculated):         Discharge exam  GENERAL: No apparent distress.  Nontoxic. HEENT: MMM.  Vision and hearing grossly intact.  NECK: Supple.  No apparent JVD.  RESP:  No IWOB.  Fair aeration bilaterally. CVS:  RRR. Heart sounds normal.  ABD/GI/GU: BS+. Abd soft, NTND.  MSK/EXT:  Moves extremities. No apparent deformity. No edema.  SKIN: no apparent skin lesion or wound NEURO: Awake and alert. Oriented appropriately.  No apparent focal neuro deficit. PSYCH: Calm. Normal affect.   Discharge Instructions Discharge Instructions     Diet - low sodium heart healthy   Complete by: As directed    Discharge instructions   Complete by: As directed    It has been a pleasure taking  care of you!  You were hospitalized due to rectal bleeding likely from diverticulosis.  Your hemoglobin is stabilized after initial drop.  We have started you on antibiotics for possible infection.  It is very important that you complete the whole course of antibiotics.  Continue soft diet.  Follow-up with your primary care doctor in 1 week.  Follow-up with gastroenterology as per their recommendation.   Take care,   Increase activity slowly   Complete by: As directed       Allergies as of 07/01/2023       Reactions   Ace Inhibitors Swelling   Angiotensin Receptor Blockers    Would avoid, h/o swelling with ACE prev   Aspirin Other (See Comments)   GI bleed   Bisphosphonates    GI side effects   Nsaids    GI bleed   Prolia [denosumab]    GI upset        Medication List     TAKE these medications    amLODipine  2.5 MG tablet Commonly known as: NORVASC  Take 1 tablet (2.5 mg total) by mouth daily.   amoxicillin -clavulanate 875-125 MG tablet Commonly known as: AUGMENTIN  Take 1 tablet by mouth every 12 (twelve) hours for 7 days.   Fusion Plus Caps Take 1 capsule by mouth every other day.   hydrocortisone  25 MG suppository Commonly known as: ANUSOL -HC Place 1 suppository (25 mg total) rectally daily for 5 days.   indapamide  2.5 MG tablet Commonly known as: LOZOL  Take 1 tablet (2.5 mg total) by mouth daily.   Potassium Citrate  15 MEQ (1620 MG) Tbcr Take 1 tablet by mouth daily.   simvastatin  10 MG tablet Commonly known as: ZOCOR  Take 1 tablet (10 mg total) by mouth every other day.   VITAMIN C GUMMIE PO Take 2 each by mouth daily.   Vitamin D3 50 MCG (2000 UT) capsule Take 2 capsules (4,000 Units total) by mouth daily.        Consultations: Gastroenterology  Procedures/Studies:   CT ANGIO GI BLEED Result Date: 06/30/2023 CLINICAL DATA:  Pain with GI bleeding. EXAM: CTA ABDOMEN AND PELVIS WITHOUT AND WITH CONTRAST TECHNIQUE: Multidetector CT imaging  of the abdomen and pelvis was performed using the standard protocol during bolus administration of intravenous contrast. Multiplanar reconstructed images and MIPs were obtained and reviewed to evaluate the vascular anatomy. RADIATION DOSE REDUCTION: This exam was performed according to the departmental dose-optimization program which includes automated exposure control, adjustment of the mA and/or kV according to patient size and/or use of iterative reconstruction technique. CONTRAST:  OMNIPAQUE  IOHEXOL  350 MG/ML SOLN COMPARISON:  03/09/2023. FINDINGS: VASCULAR Aorta: Normal caliber aorta without aneurysm, dissection, vasculitis or significant stenosis. Mild atherosclerotic calcification evident. Celiac: Patent without evidence of aneurysm, dissection, vasculitis or significant stenosis. SMA: Patent without evidence of aneurysm, dissection, vasculitis or significant stenosis. Renals: Both renal arteries are patent without evidence of aneurysm, dissection, vasculitis, fibromuscular dysplasia or significant stenosis. IMA: Patent without  evidence of aneurysm, dissection, vasculitis or significant stenosis. Inflow: Patent without evidence of aneurysm, dissection, vasculitis or significant stenosis. Proximal Outflow: Bilateral common femoral and visualized portions of the superficial and profunda femoral arteries are patent without evidence of aneurysm, dissection, vasculitis or significant stenosis. Veins: No obvious venous abnormality within the limitations of this arterial phase study. Review of the MIP images confirms the above findings. NON-VASCULAR Lower chest: No acute findings. 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 There is no evidence for gallstones, gallbladder wall thickening, or pericholecystic fluid. No intrahepatic or extrahepatic biliary dilation. Pancreas: No focal mass lesion. No dilatation of the main duct. No intraparenchymal  cyst. No peripancreatic edema. Spleen: No splenomegaly. No suspicious focal mass lesion. Adrenals/Urinary Tract: No adrenal nodule or mass. 7 mm right adrenal nodule stable since CT 12/12/2019 consistent with benign etiology. No followup imaging is recommended. 11.1 cm exophytic simple cyst noted upper pole right kidney, similar to prior. Central sinus cysts noted in both kidneys. Tiny well-defined homogeneous low-density lesions in both kidneys are too small to characterize but are statistically most likely benign and probably cysts. No followup imaging is recommended. No evidence for hydroureter. The urinary bladder appears normal for the degree of distention. Stomach/Bowel: Prominent arterial anatomy identified along the lateral wall of the gastric body (see axial 77/10) with no accumulation of contrast material in this region on delayed imaging. This appears to represent a vessel on arterial phase imaging and could be a vascular malformation. No evidence for active contrast extravasation into the duodenum. No evidence for contrast extravasation in the small bowel. Layering high density material is seen in the dependent cecum (866/11) but this was present on precontrast imaging compatible with ingested contents. No evidence for active contrast extravasation into the lumen of the colon. The patient does have diffuse diverticular disease throughout the colon and a short segment of colonic wall thickening with pericolonic edema/inflammation in the proximal to mid sigmoid colon (image 165/10). Imaging features most suggestive of diverticulitis. No evidence for contrast extravasation in this region of inflammation. Of note, the descending colon does not lie in the paracolic gutter but courses from the splenic flexure medially into the central pelvis. Lymphatic: There is no gastrohepatic or hepatoduodenal ligament lymphadenopathy. No retroperitoneal or mesenteric lymphadenopathy. No pelvic sidewall lymphadenopathy.  Reproductive: There is no adnexal mass.  Hysterectomy. Other: No intraperitoneal free fluid. Musculoskeletal: No worrisome lytic or sclerotic osseous abnormality. IMPRESSION: 1. No evidence for active contrast extravasation into the lumen of the bowel to suggest hemorrhage. 2. Diffuse diverticular disease throughout the colon with short segment of colonic wall thickening and pericolonic edema/inflammation in the proximal to mid sigmoid colon. Imaging features most suggestive of diverticulitis. No evidence for contrast extravasation in this region of inflammation. No findings of perforation or abscess. 3. Prominent arterial anatomy along the lateral wall of the gastric body with no accumulation of contrast material in this region on delayed imaging. This appears to represent a vessel on arterial phase imaging and could be a vascular malformation. 4. 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 5. Stable 7 mm right adrenal nodule consistent with benign etiology. No followup imaging is recommended. 6.  Aortic Atherosclerosis (ICD10-I70.0). Electronically Signed   By: Donnal Fusi M.D.   On: 06/30/2023 06:06       The results of significant diagnostics from this hospitalization (including imaging, microbiology, ancillary and laboratory) are listed below for reference.  Microbiology: No results found for this or any previous visit (from the past 240 hours).   Labs:  CBC: Recent Labs  Lab 06/28/23 1007 06/29/23 1643 06/30/23 0049 06/30/23 0756 06/30/23 1503 06/30/23 2305 07/01/23 0407  WBC 8.9 10.2 11.1* 9.9  --   --  8.7  NEUTROABS 5.8 6.6  --   --   --   --   --   HGB 14.8 12.3 11.9* 11.3* 11.0* 11.1* 11.3*  HCT 46.3* 36.7 35.3* 34.2* 32.6* 33.3* 33.6*  MCV 88.0 85.0 84.2 84.9  --   --  85.3  PLT 206 261 233 245  --   --  252   BMP &GFR Recent Labs  Lab 06/28/23 1137 06/29/23 1643 06/30/23 0756 07/01/23 0407  NA 143 141 139 141  K 3.2* 2.8*  3.6 3.4*  CL 105 105 103 104  CO2 27 24 26 25   GLUCOSE 98 145* 124* 145*  BUN 20 21 13 11   CREATININE 0.51 0.64 0.63 0.56  CALCIUM 9.4 9.3 8.6* 8.6*  MG  --   --  1.6* 1.9  PHOS  --   --   --  2.9   Estimated Creatinine Clearance: 45.4 mL/min (by C-G formula based on SCr of 0.56 mg/dL). Liver & Pancreas: Recent Labs  Lab 06/28/23 1137 06/29/23 1643 07/01/23 0407  AST 15 14*  --   ALT 12 11  --   ALKPHOS 63 46  --   BILITOT 0.6 0.4  --   PROT 7.0 6.2*  --   ALBUMIN 3.9 3.3* 3.0*   No results for input(s): LIPASE, AMYLASE in the last 168 hours. No results for input(s): AMMONIA in the last 168 hours. Diabetic: No results for input(s): HGBA1C in the last 72 hours. No results for input(s): GLUCAP in the last 168 hours. Cardiac Enzymes: No results for input(s): CKTOTAL, CKMB, CKMBINDEX, TROPONINI in the last 168 hours. No results for input(s): PROBNP in the last 8760 hours. Coagulation Profile: Recent Labs  Lab 06/28/23 1139 06/29/23 1643  INR 1.0 1.0   Thyroid  Function Tests: No results for input(s): TSH, T4TOTAL, FREET4, T3FREE, THYROIDAB in the last 72 hours. Lipid Profile: No results for input(s): CHOL, HDL, LDLCALC, TRIG, CHOLHDL, LDLDIRECT in the last 72 hours. Anemia Panel: No results for input(s): VITAMINB12, FOLATE, FERRITIN, TIBC, IRON, RETICCTPCT in the last 72 hours. Urine analysis:    Component Value Date/Time   COLORURINE YELLOW 03/09/2023 0347   APPEARANCEUR CLEAR 03/09/2023 0347   LABSPEC 1.026 03/09/2023 0347   PHURINE 5.0 03/09/2023 0347   GLUCOSEU NEGATIVE 03/09/2023 0347   HGBUR NEGATIVE 03/09/2023 0347   BILIRUBINUR NEGATIVE 03/09/2023 0347   BILIRUBINUR neg 04/20/2022 1007   KETONESUR NEGATIVE 03/09/2023 0347   PROTEINUR NEGATIVE 03/09/2023 0347   UROBILINOGEN 0.2 04/20/2022 1007   NITRITE NEGATIVE 03/09/2023 0347   LEUKOCYTESUR NEGATIVE 03/09/2023 0347   Sepsis Labs: Invalid input(s):  PROCALCITONIN, LACTICIDVEN   SIGNED:  Retina Bernardy T Mehgan Santmyer, MD  [facility] 07/01/2023, 2:56 PM

## 2023-07-01 NOTE — Plan of Care (Signed)
Discharge instructions discussed with patient.  Patient instructed on home medications, restrictions, and follow up appointments. Belongings gathered and sent with patient.  Patients medications picked up from TOC.

## 2023-07-01 NOTE — Plan of Care (Signed)

## 2023-07-01 NOTE — Progress Notes (Addendum)
 Daily Progress Note  DOA: 06/29/2023 Hospital Day: 3   Cc:  Lower GI bleeding   Attending physician's note   I personally saw the patient and performed a substantive portion of this encounter (>50% time spent), including a complete performance of at least one of the key components (MDM, Hx and/or Exam), in conjunction with the APP.  I agree with the APP's note, impression, and recommendations with additional input as follows.     Patient feels well, denies any abdominal pain She had a small BM with dark red stool, possibly residual blood in the colon Follow-up hemoglobin, if stable okay to discharge patient home  Will arrange outpatient GI follow-up and colonoscopy to further evaluate findings of sigmoid thickening on CT to exclude neoplastic lesion  The patient was provided an opportunity to ask questions and all were answered. The patient agreed with the plan and demonstrated an understanding of the instructions.   Lorena Rolling , MD [phone]    ASSESSMENT    78 year old female with recurrent painless hematochezia likely diverticular hemorrhage.  CT angio negative for active bleeding. She has a history of recurrent lower GI bleeds ( last one in Feb 2025) Today:  Hgb stable overnight at 11.3. No BMs / bleeding during the night but a short while ago she passed some dark red blood ( same color as it has been).  Difficult to know now if actively bleeding or was that old blood  Abnormal colon on CT scan  Sigmoid wall thickening, cannot exclude diverticulitis though she hasn't had any abdominal pain and non-tender.    Principal Problem:   Lower GI bleeding Active Problems:   HTN (hypertension)   HLD (hyperlipidemia)   Hypokalemia   PLAN   --If no further bleeding she may be able to go home later today. Hopefully it was old blood that she passed this am. Hgb 11.3 at 4 am, will repeat to make sure stable.  --Continue Augmentin  875 mg twice daily for total of 7  days --She will need an outpatient colonoscopy. Sees Dr. Baldomero Bone but Dr. Leonia Raman accepted transfer of care per patient's request.    Subjective   Tolerating solids.  A short while ago she passed a small amount of dark red blood from rectum  Objective    Recent Labs    06/30/23 0049 06/30/23 0756 06/30/23 1503 06/30/23 2305 07/01/23 0407  WBC 11.1* 9.9  --   --  8.7  HGB 11.9* 11.3* 11.0* 11.1* 11.3*  HCT 35.3* 34.2* 32.6* 33.3* 33.6*  MCV 84.2 84.9  --   --  85.3  PLT 233 245  --   --  252   No results for input(s): FOLATE, VITAMINB12, FERRITIN, TIBC, IRONPCTSAT in the last 72 hours. Recent Labs    06/29/23 1643 06/30/23 0756 07/01/23 0407  NA 141 139 141  K 2.8* 3.6 3.4*  CL 105 103 104  CO2 24 26 25   GLUCOSE 145* 124* 145*  BUN 21 13 11   CREATININE 0.64 0.63 0.56  CALCIUM 9.3 8.6* 8.6*   Recent Labs    06/29/23 1643 07/01/23 0407  PROT 6.2*  --   ALBUMIN 3.3* 3.0*  AST 14*  --   ALT 11  --   ALKPHOS 46  --   BILITOT 0.4  --     Imaging:  CT ANGIO GI BLEED CLINICAL DATA:  Pain with GI bleeding.  EXAM: CTA ABDOMEN AND PELVIS WITHOUT AND WITH CONTRAST  TECHNIQUE: Multidetector  CT imaging of the abdomen and pelvis was performed using the standard protocol during bolus administration of intravenous contrast. Multiplanar reconstructed images and MIPs were obtained and reviewed to evaluate the vascular anatomy.  RADIATION DOSE REDUCTION: This exam was performed according to the departmental dose-optimization program which includes automated exposure control, adjustment of the mA and/or kV according to patient size and/or use of iterative reconstruction technique.  CONTRAST:  OMNIPAQUE  IOHEXOL  350 MG/ML SOLN  COMPARISON:  03/09/2023.  FINDINGS: VASCULAR  Aorta: Normal caliber aorta without aneurysm, dissection, vasculitis or significant stenosis. Mild atherosclerotic calcification evident.  Celiac: Patent without evidence of  aneurysm, dissection, vasculitis or significant stenosis.  SMA: Patent without evidence of aneurysm, dissection, vasculitis or significant stenosis.  Renals: Both renal arteries are patent without evidence of aneurysm, dissection, vasculitis, fibromuscular dysplasia or significant stenosis.  IMA: Patent without evidence of aneurysm, dissection, vasculitis or significant stenosis.  Inflow: Patent without evidence of aneurysm, dissection, vasculitis or significant stenosis.  Proximal Outflow: Bilateral common femoral and visualized portions of the superficial and profunda femoral arteries are patent without evidence of aneurysm, dissection, vasculitis or significant stenosis.  Veins: No obvious venous abnormality within the limitations of this arterial phase study.  Review of the MIP images confirms the above findings.  NON-VASCULAR  Lower chest: No acute findings.  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 There is no evidence for gallstones, gallbladder wall thickening, or pericholecystic fluid. No intrahepatic or extrahepatic biliary dilation.  Pancreas: No focal mass lesion. No dilatation of the main duct. No intraparenchymal cyst. No peripancreatic edema.  Spleen: No splenomegaly. No suspicious focal mass lesion.  Adrenals/Urinary Tract: No adrenal nodule or mass. 7 mm right adrenal nodule stable since CT 12/12/2019 consistent with benign etiology. No followup imaging is recommended. 11.1 cm exophytic simple cyst noted upper pole right kidney, similar to prior. Central sinus cysts noted in both kidneys. Tiny well-defined homogeneous low-density lesions in both kidneys are too small to characterize but are statistically most likely benign and probably cysts. No followup imaging is recommended. No evidence for hydroureter. The urinary bladder appears normal for the degree of  distention.  Stomach/Bowel: Prominent arterial anatomy identified along the lateral wall of the gastric body (see axial 77/10) with no accumulation of contrast material in this region on delayed imaging. This appears to represent a vessel on arterial phase imaging and could be a vascular malformation. No evidence for active contrast extravasation into the duodenum. No evidence for contrast extravasation in the small bowel. Layering high density material is seen in the dependent cecum (866/11) but this was present on precontrast imaging compatible with ingested contents. No evidence for active contrast extravasation into the lumen of the colon. The patient does have diffuse diverticular disease throughout the colon and a short segment of colonic wall thickening with pericolonic edema/inflammation in the proximal to mid sigmoid colon (image 165/10). Imaging features most suggestive of diverticulitis. No evidence for contrast extravasation in this region of inflammation. Of note, the descending colon does not lie in the paracolic gutter but courses from the splenic flexure medially into the central pelvis.  Lymphatic: There is no gastrohepatic or hepatoduodenal ligament lymphadenopathy. No retroperitoneal or mesenteric lymphadenopathy. No pelvic sidewall lymphadenopathy.  Reproductive: There is no adnexal mass.  Hysterectomy.  Other: No intraperitoneal free fluid.  Musculoskeletal: No worrisome lytic or sclerotic osseous abnormality.  IMPRESSION: 1. No evidence for active contrast extravasation into the lumen of the bowel to suggest  hemorrhage. 2. Diffuse diverticular disease throughout the colon with short segment of colonic wall thickening and pericolonic edema/inflammation in the proximal to mid sigmoid colon. Imaging features most suggestive of diverticulitis. No evidence for contrast extravasation in this region of inflammation. No findings of perforation or abscess. 3.  Prominent arterial anatomy along the lateral wall of the gastric body with no accumulation of contrast material in this region on delayed imaging. This appears to represent a vessel on arterial phase imaging and could be a vascular malformation. 4. 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 5. Stable 7 mm right adrenal nodule consistent with benign etiology. No followup imaging is recommended. 6.  Aortic Atherosclerosis (ICD10-I70.0).  Electronically Signed   By: Donnal Fusi M.D.   On: 06/30/2023 06:06     Scheduled inpatient medications:   amoxicillin -clavulanate  1 tablet Oral Q12H   hydrocortisone   25 mg Rectal Daily   simvastatin   10 mg Oral QODAY   sodium chloride  flush  3 mL Intravenous Q12H   Continuous inpatient infusions:  PRN inpatient medications: acetaminophen  **OR** acetaminophen , prochlorperazine , sodium chloride  flush  Vital signs in last 24 hours: Temp:  [97.6 F (36.4 C)-98.5 F (36.9 C)] 98.5 F (36.9 C) (06/13 0807) Pulse Rate:  [69-86] 81 (06/13 0807) Resp:  [18] 18 (06/13 0428) BP: (100-127)/(55-92) 108/92 (06/13 0807) SpO2:  [96 %-99 %] 99 % (06/13 0807) Last BM Date : 06/30/23 No intake or output data in the 24 hours ending 07/01/23 1145  Intake/Output from previous day: No intake/output data recorded. Intake/Output this shift: No intake/output data recorded.   Physical Exam:  General: Alert female in NAD Heart:  Regular rate and rhythm.  Pulmonary: Normal respiratory effort Abdomen: Soft, nondistended, nontender. Normal bowel sounds. Extremities: No lower extremity edema  Neurologic: Alert and oriented Psych: Pleasant. Cooperative     LOS: 0 days   Mai Schwalbe ,NP 07/01/2023, 11:45 AM

## 2023-07-04 ENCOUNTER — Telehealth: Payer: Self-pay

## 2023-07-04 NOTE — Transitions of Care (Post Inpatient/ED Visit) (Unsigned)
   07/04/2023  Name: Patricia Hoover MRN: [id number] DOB: 26-Jan-1945  Today's TOC FU Call Status: Today's TOC FU Call Status:: Unsuccessful Call (1st Attempt) Unsuccessful Call (1st Attempt) Date: 07/04/23  Attempted to reach the patient regarding the most recent Inpatient/ED visit.  Follow Up Plan: Additional outreach attempts will be made to reach the patient to complete the Transitions of Care (Post Inpatient/ED visit) call.   Signature  Darrall Ellison, LPN [facility] Nurse Health Advisor Direct Dial [phone]

## 2023-07-05 ENCOUNTER — Telehealth: Payer: Self-pay | Admitting: Internal Medicine

## 2023-07-05 NOTE — Telephone Encounter (Signed)
 Good afternoon Dr. Bridgett Camps,   Patient is wishing to transfer her care over to Dr. Leonia Raman. Patient's care was under you in 2016 but then patient to transferred her care over to [facility]. Patient is now requesting to transfer her care to Dr. Leonia Raman due to being seen with her at the hospital in [place]. Also due to previous provider is no longer at [facility]. Patient wishing to be seen for GI bleed follow up. Please review and advise on scheduling.   Thank you.

## 2023-07-05 NOTE — Transitions of Care (Post Inpatient/ED Visit) (Signed)
   07/05/2023  Name: Patricia Hoover MRN: [id number] DOB: [birth year]  Today's TOC FU Call Status: Today's TOC FU Call Status:: Unsuccessful Call (2nd Attempt) Unsuccessful Call (1st Attempt) Date: 07/04/23 Unsuccessful Call (2nd Attempt) Date: 07/05/23  Attempted to reach the patient regarding the most recent Inpatient/ED visit.  Follow Up Plan: Additional outreach attempts will be made to reach the patient to complete the Transitions of Care (Post Inpatient/ED visit) call.   Signature Darrall Ellison, LPN [facility] Nurse Health Advisor Direct Dial [phone]

## 2023-07-05 NOTE — Telephone Encounter (Signed)
 Patient called back to return call. Please advise.

## 2023-07-06 NOTE — Telephone Encounter (Signed)
 Ok to schedule next available appointment with me or APP

## 2023-07-06 NOTE — Telephone Encounter (Signed)
 Ok with me, will be up to [facility]N [facility]

## 2023-07-06 NOTE — Transitions of Care (Post Inpatient/ED Visit) (Signed)
   07/06/2023  Name: Patricia Hoover MRN: [id number] DOB: 03-18-1945  Today's TOC FU Call Status: Today's TOC FU Call Status:: Unsuccessful Call (3rd Attempt) Unsuccessful Call (1st Attempt) Date: 07/04/23 Unsuccessful Call (2nd Attempt) Date: 07/05/23 Unsuccessful Call (3rd Attempt) Date: 07/06/23  Attempted to reach the patient regarding the most recent Inpatient/ED visit.  Follow Up Plan: No further outreach attempts will be made at this time. We have been unable to contact the patient.  Signature Darrall Ellison, LPN [facility] Nurse Health Advisor Direct Dial [phone]

## 2023-07-07 NOTE — Telephone Encounter (Signed)
 Patient advised on previous recommendations and scheduled for o/v with PA

## 2023-07-11 ENCOUNTER — Ambulatory Visit: Payer: Self-pay | Admitting: Family Medicine

## 2023-07-11 ENCOUNTER — Encounter: Payer: Self-pay | Admitting: Family Medicine

## 2023-07-11 ENCOUNTER — Ambulatory Visit (INDEPENDENT_AMBULATORY_CARE_PROVIDER_SITE_OTHER): Admitting: Family Medicine

## 2023-07-11 VITALS — BP 126/64 | HR 72 | Temp 98.6°F | Ht <= 58 in | Wt 145.0 lb

## 2023-07-11 DIAGNOSIS — K922 Gastrointestinal hemorrhage, unspecified: Secondary | ICD-10-CM

## 2023-07-11 LAB — CBC WITH DIFFERENTIAL/PLATELET
Basophils Absolute: 0.1 10*3/uL (ref 0.0–0.1)
Basophils Relative: 0.8 % (ref 0.0–3.0)
Eosinophils Absolute: 1.4 10*3/uL — ABNORMAL HIGH (ref 0.0–0.7)
Eosinophils Relative: 12 % — ABNORMAL HIGH (ref 0.0–5.0)
HCT: 34 % — ABNORMAL LOW (ref 36.0–46.0)
Hemoglobin: 11.3 g/dL — ABNORMAL LOW (ref 12.0–15.0)
Lymphocytes Relative: 25.8 % (ref 12.0–46.0)
Lymphs Abs: 3 10*3/uL (ref 0.7–4.0)
MCHC: 33.3 g/dL (ref 30.0–36.0)
MCV: 84 fl (ref 78.0–100.0)
Monocytes Absolute: 0.7 10*3/uL (ref 0.1–1.0)
Monocytes Relative: 5.5 % (ref 3.0–12.0)
Neutro Abs: 6.6 10*3/uL (ref 1.4–7.7)
Neutrophils Relative %: 55.9 % (ref 43.0–77.0)
Platelets: 325 10*3/uL (ref 150.0–400.0)
RBC: 4.04 Mil/uL (ref 3.87–5.11)
RDW: 14.7 % (ref 11.5–15.5)
WBC: 11.8 10*3/uL — ABNORMAL HIGH (ref 4.0–10.5)

## 2023-07-11 LAB — PROTIME-INR
INR: 1.1 ratio — ABNORMAL HIGH (ref 0.8–1.0)
Prothrombin Time: 12.1 s (ref 9.6–13.1)

## 2023-07-11 LAB — COMPREHENSIVE METABOLIC PANEL WITH GFR
ALT: 9 U/L (ref 0–35)
AST: 14 U/L (ref 0–37)
Albumin: 4 g/dL (ref 3.5–5.2)
Alkaline Phosphatase: 68 U/L (ref 39–117)
BUN: 15 mg/dL (ref 6–23)
CO2: 33 meq/L — ABNORMAL HIGH (ref 19–32)
Calcium: 9.2 mg/dL (ref 8.4–10.5)
Chloride: 100 meq/L (ref 96–112)
Creatinine, Ser: 0.7 mg/dL (ref 0.40–1.20)
GFR: 82.65 mL/min (ref >60.00)
Glucose, Bld: 101 mg/dL — ABNORMAL HIGH (ref 70–99)
Potassium: 3.2 meq/L — ABNORMAL LOW (ref 3.5–5.1)
Sodium: 140 meq/L (ref 135–145)
Total Bilirubin: 0.3 mg/dL (ref 0.2–1.2)
Total Protein: 6.7 g/dL (ref 6.0–8.3)

## 2023-07-11 NOTE — Patient Instructions (Signed)
 Go to the lab on the way out.   If you have mychart we'll likely use that to update you.    We'll update ortho.  I don't know if they will want to proceed with surgery right now.  Take care.  Glad to see you.

## 2023-07-11 NOTE — Assessment & Plan Note (Addendum)
 Admitted with GI bleed.  Having black stools now attributed to iron use.  Discussed using MiraLAX and continuing iron in the meantime.  See notes on labs.  At this point okay for outpatient follow-up.  Benign abdominal exam.  She is going to follow-up with GI.  We'll update ortho.  I don't know if Dr. Addie will want to proceed with surgery right now.

## 2023-07-11 NOTE — Progress Notes (Signed)
  Admit date: 06/29/2023 Discharge date: 07/01/23   Admitted From: Home Disposition: Home Recommendations for Outpatient Follow-up:  Follow up with PCP in 1 week GI to arrange outpatient follow-up for possible colonoscopy to assess sigmoid wall thickening Check CBC and CMP in 1 week  Hospital course 78 year old F with PMH of diverticulosis, hemorrhoids, PUD, HTN and HLD presenting with painless hematochezia, and admitted with possible acute diverticular bleed.  Reportedly has similar episode when she was admitted in February 2025.  Not on antiplatelet or anticoagulation.  Hgb 12.3 (was 14.8 on 6/10).  She also had hypokalemia to 2.8.  CT angio with sigmoid wall thickening/edema.  GI altered and she was admitted for further evaluation and management.    Patient has some intermittent small-volume blood in the stool but hemoglobin leveled off at 11.3 after initial drop.  Evaluated by GI and started on p.o. Augmentin  and advance to soft diet that she tolerated.   On the day of discharge, hemoglobin remained stable at 11.3 and she was cleared for discharge for outpatient follow-up.    See individual problem list below for more.    Problems addressed during this hospitalization Acute blood loss anemia due to painless lower GI bleed likely diverticular: Has had some blood in stool but hemoglobin remained stable after initial drop.  Tolerated soft diet. Recent Labs (within last 365 days)  ======================== -Cleared for discharge by GI on p.o. Augmentin  - GI to arrange outpatient follow-up for colonoscopy to assess sigmoid wall thickening   Abnormal CT abdomen: Showed sigmoid wall thickening.  Has no pain or tenderness on exam. - GI to arrange outpatient follow-up for colonoscopy to rule out malignancy   Hypokalemia/hypomagnesemia: Hypomagnesemia resolved.  Received p.o. KCl 40 mill equivalent before discharge   History of H. pylori/PUD in 2019   Essential hypertension: Normotensive -  Continue home meds.   Hyperlipidemia - Continue Zocor    Hemorrhoids - Continue Anusol  cream   Class I obesity Body mass index is 31.49 kg/m.   ================================= D/w pt about possible ortho surgery.  This may need to be deferred, depending on her blood counts and depending upon input from the orthopedic clinic.  She has GI f/u pending.    Inpatient course d/w pt. admitted with GI bleed and imaging consistent with diverticulitis.  No abd pain prior or currently.  No FCNAV except for upset stomach with prev abx use on empty stomach.  Prev blood in stools but not seen now.  Done with abx done.  Black stools with iron use. D/w pt about trial of miralax to deal with constipation.  Had been using Citrucel.  Some fatigue noted.    Prev colonoscopy with diverticulosis in the sigmoid colon, in the descending colon and in the ascending colon.  Meds, vitals, and allergies reviewed.   ROS: Per HPI unless specifically indicated in ROS section   GEN: nad, alert and oriented HEENT: mucous membranes moist NECK: supple w/o LA CV: rrr. PULM: ctab, no inc wob ABD: soft, +bs EXT: no edema SKIN: Well-perfused Walking with a limp from knee pain.  35 minutes were devoted to patient care in this encounter (this includes time spent reviewing the patient's file/history, interviewing and examining the patient, counseling/reviewing plan with patient).

## 2023-07-12 NOTE — Pre-Procedure Instructions (Signed)
 Surgical Instructions   Your procedure is scheduled on Tuesday, July 8th. Report to [facility] Main Entrance A at 05:30 A.M., then check in with the Admitting office. Any questions or running late day of surgery: call [phone]  Questions prior to your surgery date: call [phone], Monday-Friday, 8am-4pm. If you experience any cold or flu symptoms such as cough, fever, chills, shortness of breath, etc. between now and your scheduled surgery, please notify us  at the above number.     Remember:  Do not eat after midnight the night before your surgery  You may drink clear liquids until 04:30 AM the morning of your surgery.   Clear liquids allowed are: Water, Non-Citrus Juices (without pulp), Carbonated Beverages, Clear Tea (no milk, honey, etc.), Black Coffee Only (NO MILK, CREAM OR POWDERED CREAMER of any kind), and Gatorade.  Patient Instructions  The night before surgery:  No food after midnight. ONLY clear liquids after midnight  The day of surgery (if you do NOT have diabetes):  Drink ONE (1) Pre-Surgery Clear Ensure by 04:30 AM the morning of surgery. Drink in one sitting. Do not sip.  This drink was given to you during your hospital  pre-op appointment visit.  Nothing else to drink after completing the  Pre-Surgery Clear Ensure.          If you have questions, please contact your surgeon's office.    Take these medicines the morning of surgery with A SIP OF WATER  amLODipine  (NORVASC )  simvastatin  (ZOCOR )     One week prior to surgery, STOP taking any Aspirin (unless otherwise instructed by your surgeon) Aleve, Naproxen, Ibuprofen , Motrin , Advil , Goody's, BC's, all herbal medications, fish oil, and non-prescription vitamins.                     Do NOT Smoke (Tobacco/Vaping) for 24 hours prior to your procedure.  If you use a CPAP at night, you may bring your mask/headgear for your overnight stay.   You will be asked to remove any contacts, glasses,  piercing's, hearing aid's, dentures/partials prior to surgery. Please bring cases for these items if needed.    Patients discharged the day of surgery will not be allowed to drive home, and someone needs to stay with them for 24 hours.  SURGICAL WAITING ROOM VISITATION Patients may have no more than 2 support people in the waiting area - these visitors may rotate.   Pre-op nurse will coordinate an appropriate time for 1 ADULT support person, who may not rotate, to accompany patient in pre-op.  Children under the age of 42 must have an adult with them who is not the patient and must remain in the main waiting area with an adult.  If the patient needs to stay at the hospital during part of their recovery, the visitor guidelines for inpatient rooms apply.  Please refer to the [facility] website for the visitor guidelines for any additional information.   If you received a COVID test during your pre-op visit  it is requested that you wear a mask when out in public, stay away from anyone that may not be feeling well and notify your surgeon if you develop symptoms. If you have been in contact with anyone that has tested positive in the last 10 days please notify you surgeon.      Pre-operative 5 CHG Bathing Instructions   You can play a key role in reducing the risk of infection after surgery. Your skin needs to  be as free of germs as possible. You can reduce the number of germs on your skin by washing with CHG (chlorhexidine gluconate) soap before surgery. CHG is an antiseptic soap that kills germs and continues to kill germs even after washing.   DO NOT use if you have an allergy to chlorhexidine/CHG or antibacterial soaps. If your skin becomes reddened or irritated, stop using the CHG and notify one of our RNs at [phone].   Please shower with the CHG soap starting 4 days before surgery using the following schedule:     Please keep in mind the following:  DO NOT shave, including legs  and underarms, starting the day of your first shower.   You may shave your face at any point before/day of surgery.  Place clean sheets on your bed the day you start using CHG soap. Use a clean washcloth (not used since being washed) for each shower. DO NOT sleep with pets once you start using the CHG.   CHG Shower Instructions:  Wash your face and private area with normal soap. If you choose to wash your hair, wash first with your normal shampoo.  After you use shampoo/soap, rinse your hair and body thoroughly to remove shampoo/soap residue.  Turn the water OFF and apply about 3 tablespoons (45 ml) of CHG soap to a CLEAN washcloth.  Apply CHG soap ONLY FROM YOUR NECK DOWN TO YOUR TOES (washing for 3-5 minutes)  DO NOT use CHG soap on face, private areas, open wounds, or sores.  Pay special attention to the area where your surgery is being performed.  If you are having back surgery, having someone wash your back for you may be helpful. Wait 2 minutes after CHG soap is applied, then you may rinse off the CHG soap.  Pat dry with a clean towel  Put on clean clothes/pajamas   If you choose to wear lotion, please use ONLY the CHG-compatible lotions that are listed below.  Additional instructions for the day of surgery: DO NOT APPLY any lotions, deodorants, cologne, or perfumes.   Do not bring valuables to the hospital. [facility] is not responsible for any belongings/valuables. Do not wear nail polish, gel polish, artificial nails, or any other type of covering on natural nails (fingers and toes) Do not wear jewelry or makeup Put on clean/comfortable clothes.  Please brush your teeth.  Ask your nurse before applying any prescription medications to the skin.     CHG Compatible Lotions   Aveeno Moisturizing lotion  Cetaphil Moisturizing Cream  Cetaphil Moisturizing Lotion  Clairol Herbal Essence Moisturizing Lotion, Dry Skin  Clairol Herbal Essence Moisturizing Lotion, Extra Dry Skin   Clairol Herbal Essence Moisturizing Lotion, Normal Skin  Curel Age Defying Therapeutic Moisturizing Lotion with Alpha Hydroxy  Curel Extreme Care Body Lotion  Curel Soothing Hands Moisturizing Hand Lotion  Curel Therapeutic Moisturizing Cream, Fragrance-Free  Curel Therapeutic Moisturizing Lotion, Fragrance-Free  Curel Therapeutic Moisturizing Lotion, Original Formula  Eucerin Daily Replenishing Lotion  Eucerin Dry Skin Therapy Plus Alpha Hydroxy Crme  Eucerin Dry Skin Therapy Plus Alpha Hydroxy Lotion  Eucerin Original Crme  Eucerin Original Lotion  Eucerin Plus Crme Eucerin Plus Lotion  Eucerin TriLipid Replenishing Lotion  Keri Anti-Bacterial Hand Lotion  Keri Deep Conditioning Original Lotion Dry Skin Formula Softly Scented  Keri Deep Conditioning Original Lotion, Fragrance Free Sensitive Skin Formula  Keri Lotion Fast Absorbing Fragrance Free Sensitive Skin Formula  Keri Lotion Fast Absorbing Softly Scented Dry Skin Formula  Keri Original  Lotion  [employer] Skin Renewal Lotion Keri Silky Smooth Lotion  Keri Silky Smooth Sensitive Skin Lotion  Nivea Body Creamy Conditioning Oil  Nivea Body Extra Enriched Teacher, adult education Moisturizing Lotion Nivea Crme  Nivea Skin Firming Lotion  NutraDerm 30 Skin Lotion  NutraDerm Skin Lotion  NutraDerm Therapeutic Skin Cream  NutraDerm Therapeutic Skin Lotion  ProShield Protective Hand Cream  Provon moisturizing lotion  Please read over the following fact sheets that you were given.

## 2023-07-13 ENCOUNTER — Other Ambulatory Visit: Payer: Self-pay | Admitting: Family Medicine

## 2023-07-13 ENCOUNTER — Inpatient Hospital Stay (HOSPITAL_COMMUNITY)
Admission: RE | Admit: 2023-07-13 | Discharge: 2023-07-13 | Disposition: A | Discharge status: Home / Self Care | Source: Ambulatory Visit

## 2023-07-13 MED ORDER — POTASSIUM CITRATE ER 15 MEQ (1620 MG) PO TBCR: 1.0000 | EXTENDED_RELEASE_TABLET | Freq: Two times a day (BID) | ORAL | Status: AC

## 2023-07-19 ENCOUNTER — Other Ambulatory Visit: Payer: Self-pay

## 2023-07-19 ENCOUNTER — Encounter (HOSPITAL_COMMUNITY)
Admission: RE | Admit: 2023-07-19 | Discharge: 2023-07-19 | Disposition: A | Discharge status: Home / Self Care | Source: Ambulatory Visit | Attending: Orthopedic Surgery | Admitting: Orthopedic Surgery

## 2023-07-19 ENCOUNTER — Encounter (HOSPITAL_COMMUNITY): Payer: Self-pay

## 2023-07-19 VITALS — BP 122/60 | HR 82 | Temp 98.5°F | Resp 17 | Ht <= 58 in | Wt 146.0 lb

## 2023-07-19 DIAGNOSIS — Z87891 Personal history of nicotine dependence: Secondary | ICD-10-CM | POA: Insufficient documentation

## 2023-07-19 DIAGNOSIS — I10 Essential (primary) hypertension: Secondary | ICD-10-CM | POA: Insufficient documentation

## 2023-07-19 DIAGNOSIS — Z01818 Encounter for other preprocedural examination: Secondary | ICD-10-CM | POA: Insufficient documentation

## 2023-07-19 DIAGNOSIS — K274 Chronic or unspecified peptic ulcer, site unspecified, with hemorrhage: Secondary | ICD-10-CM | POA: Diagnosis not present

## 2023-07-19 DIAGNOSIS — Z01812 Encounter for preprocedural laboratory examination: Secondary | ICD-10-CM | POA: Diagnosis present

## 2023-07-19 DIAGNOSIS — Z01810 Encounter for preprocedural cardiovascular examination: Secondary | ICD-10-CM | POA: Diagnosis present

## 2023-07-19 DIAGNOSIS — E785 Hyperlipidemia, unspecified: Secondary | ICD-10-CM | POA: Diagnosis not present

## 2023-07-19 DIAGNOSIS — E876 Hypokalemia: Secondary | ICD-10-CM | POA: Diagnosis not present

## 2023-07-19 DIAGNOSIS — D649 Anemia, unspecified: Secondary | ICD-10-CM | POA: Diagnosis not present

## 2023-07-19 DIAGNOSIS — R9431 Abnormal electrocardiogram [ECG] [EKG]: Secondary | ICD-10-CM | POA: Insufficient documentation

## 2023-07-19 HISTORY — DX: Other complications of anesthesia, initial encounter: T88.59XA

## 2023-07-19 LAB — URINALYSIS, W/ REFLEX TO CULTURE (INFECTION SUSPECTED)
Bacteria, UA: NONE SEEN
Bilirubin Urine: NEGATIVE
Glucose, UA: NEGATIVE mg/dL
Hgb urine dipstick: NEGATIVE
Ketones, ur: NEGATIVE mg/dL
Leukocytes,Ua: NEGATIVE
Nitrite: NEGATIVE
Protein, ur: NEGATIVE mg/dL
Specific Gravity, Urine: 1.025 (ref 1.005–1.030)
pH: 5 (ref 5.0–8.0)

## 2023-07-19 LAB — CBC
HCT: 36 % (ref 36.0–46.0)
Hemoglobin: 11.7 g/dL — ABNORMAL LOW (ref 12.0–15.0)
MCH: 28.7 pg (ref 26.0–34.0)
MCHC: 32.5 g/dL (ref 30.0–36.0)
MCV: 88.2 fL (ref 80.0–100.0)
Platelets: 305 10*3/uL (ref 150–400)
RBC: 4.08 MIL/uL (ref 3.87–5.11)
RDW: 14.7 % (ref 11.5–15.5)
WBC: 8.2 10*3/uL (ref 4.0–10.5)
nRBC: 0 % (ref 0.0–0.2)

## 2023-07-19 LAB — BASIC METABOLIC PANEL WITH GFR
Anion gap: 10 (ref 5–15)
BUN: 16 mg/dL (ref 8–23)
CO2: 30 mmol/L (ref 22–32)
Calcium: 8.8 mg/dL — ABNORMAL LOW (ref 8.9–10.3)
Chloride: 100 mmol/L (ref 98–111)
Creatinine, Ser: 0.57 mg/dL (ref 0.44–1.00)
GFR, Estimated: >60 mL/min (ref >60)
Glucose, Bld: 153 mg/dL — ABNORMAL HIGH (ref 70–99)
Potassium: 3 mmol/L — ABNORMAL LOW (ref 3.5–5.1)
Sodium: 140 mmol/L (ref 135–145)

## 2023-07-19 LAB — SURGICAL PCR SCREEN
MRSA, PCR: NEGATIVE
Staphylococcus aureus: NEGATIVE

## 2023-07-19 NOTE — Progress Notes (Signed)
 PCP - Arlyss Cleatus COME Cardiologist - denies  PPM/ICD - denies Device Orders -  Rep Notified -   Chest x-ray - na EKG - 07/19/23 Stress Test - 02/12/14 ECHO - denies Cardiac Cath - denies  Sleep Study - denies CPAP -   Fasting Blood Sugar - na Checks Blood Sugar _____ times a day  Last dose of GLP1 agonist- na GLP1 instructions:   Blood Thinner Instructions:na Aspirin Instructions:na  ERAS Protcol - clears until 0430 PRE-SURGERY Ensure or G2- Ensure  COVID TEST- na   Anesthesia review: yes-recent hospital admit 06/29/23-07/01/23 for lower GI bleed.  Patient denies shortness of breath, fever, cough and chest pain at PAT appointment   All instructions explained to the patient, with a verbal understanding of the material. Patient agrees to go over the instructions while at home for a better understanding. The opportunity to ask questions was provided.

## 2023-07-20 NOTE — Progress Notes (Signed)
 Anesthesia Chart Review:  78 year old female with pertinent history including former smoker (quit 1993), HTN, HLD, peptic ulcer, GI bleed.  Recent admission 6/11 through 07/01/2023 after presenting with painless hematochezia.  She is not on any antiplatelet or anticoagulation. Hgb 12.3 (was 14.8 on 6/10).  She also had hypokalemia to 2.8.  CT angio with sigmoid wall thickening/edema.  GI consulted and she was admitted for further evaluation and management.  Patient has some intermittent small-volume blood in the stool but hemoglobin leveled off at 11.3 after initial drop.  Evaluated by GI and started on p.o. Augmentin  for likely diverticular bleed and advance to soft diet that she tolerated. On the day of discharge, hemoglobin remained stable at 11.3 and she was cleared for discharge for outpatient follow-up.   She was seen by PCP Dr. Arlyss Solian on 07/11/2023 for postdischarge follow-up.  Repeat labs at that time showed stable anemia with hemoglobin 11.3, mild hypokalemia potassium 3.2, otherwise unremarkable.  She was recommended to continue p.o. iron and potassium supplementation.  Preop labs 07/19/2023 show anemia mildly improved with hemoglobin 11.7, persistent mild hypokalemia potassium 3.0, otherwise unremarkable.  EKG 07/19/2023: Normal sinus rhythm.  Rate 66. Possible Septal infarct , age undetermined.  No significant change     Lynwood Geofm RIGGERS [facility]/Anesthesiology Phone [phone] 07/20/2023 1:56 PM

## 2023-07-20 NOTE — Anesthesia Preprocedure Evaluation (Addendum)
 Anesthesia Evaluation  Patient identified by MRN, date of birth, ID band Patient awake    Reviewed: Allergy & Precautions, NPO status , Patient's Chart, lab work & pertinent test results  History of Anesthesia Complications (+) PROLONGED EMERGENCE and history of anesthetic complications  Airway Mallampati: II  TM Distance: >3 FB Neck ROM: Full    Dental  (+) Dental Advisory Given   Pulmonary former smoker   Pulmonary exam normal        Cardiovascular hypertension, Pt. on medications Normal cardiovascular exam     Neuro/Psych negative neurological ROS  negative psych ROS   GI/Hepatic Neg liver ROS, PUD,,,  Endo/Other   Obesity K 3.0   Renal/GU negative Renal ROS     Musculoskeletal  (+) Arthritis ,    Abdominal   Peds  Hematology negative hematology ROS (+)   Anesthesia Other Findings   Reproductive/Obstetrics                              Anesthesia Physical Anesthesia Plan  ASA: 2  Anesthesia Plan: Spinal   Post-op Pain Management: Tylenol  PO (pre-op)* and Regional block*   Induction:   PONV Risk Score and Plan: 2 and Treatment may vary due to age or medical condition and Propofol  infusion  Airway Management Planned: Natural Airway and Simple Face Mask  Additional Equipment: None  Intra-op Plan:   Post-operative Plan:   Informed Consent: I have reviewed the patients History and Physical, chart, labs and discussed the procedure including the risks, benefits and alternatives for the proposed anesthesia with the patient or authorized representative who has indicated his/her understanding and acceptance.       Plan Discussed with: CRNA and Anesthesiologist  Anesthesia Plan Comments: (PAT note by Lynwood Hope, PA-C)         Anesthesia Quick Evaluation

## 2023-07-21 ENCOUNTER — Telehealth: Payer: Self-pay

## 2023-07-21 ENCOUNTER — Telehealth: Payer: Self-pay | Admitting: Family Medicine

## 2023-07-21 NOTE — Telephone Encounter (Signed)
 Copied from CRM [id number]. Topic: Clinical - Medical Advice >> Jul 21, 2023 12:52 PM Burnard DEL wrote: Reason for CRM: Patient had labs done at the hospital on 07/19/2023 for her pre op.She would like to know do she still need to come in for her lab visit on 07/25/2023 with the office?

## 2023-07-21 NOTE — Telephone Encounter (Signed)
 Copied from CRM [id number]. Topic: Clinical - Prescription Issue >> Jul 20, 2023  4:34 PM Zebedee SAUNDERS wrote: Reason for CRM: Pt called would like to know if she can take a gummy form of polyethylene glycol powder (GLYCOLAX/MIRALAX) 17 GM/SCOOP powder? Please call pt at [phone]

## 2023-07-25 ENCOUNTER — Telehealth: Payer: Self-pay | Admitting: Radiology

## 2023-07-25 ENCOUNTER — Other Ambulatory Visit

## 2023-07-25 ENCOUNTER — Telehealth: Payer: Self-pay | Admitting: *Deleted

## 2023-07-25 DIAGNOSIS — M1712 Unilateral primary osteoarthritis, left knee: Secondary | ICD-10-CM

## 2023-07-25 DIAGNOSIS — G8929 Other chronic pain: Secondary | ICD-10-CM

## 2023-07-25 MED ORDER — TRANEXAMIC ACID 1000 MG/10ML IV SOLN
2000.0000 mg | INTRAVENOUS | Status: DC
  Filled 2023-07-25: qty 20

## 2023-07-25 NOTE — Telephone Encounter (Signed)
 Patient left voicemail on triage line that she is scheduled to have surgery with Dr. Addie tomorrow and wants to be sure that it is still on. She states that her blood count was originally low and would like to know what it is.  CB [phone]

## 2023-07-25 NOTE — Care Plan (Signed)
 [facility] RNCM call to patient to discuss her upcoming left total knee arthroplasty with Dr. Addie on 07/26/23 at [facility]. She is agreeable to case management. She has a daughter and grand-daughter that will be assisting in care after discharge. She has her home CPM, RW already. No other DME needed. Anticipate HHPT will be needed after a short hospital stay. Referral made to [facility] after choice provided. Reviewed all post op care instructions. Will continue to follow for needs.

## 2023-07-25 NOTE — Telephone Encounter (Signed)
 I called.  Potassium is 3.05 days ago.  I think if she takes 1 extra tablet tonight of potassium and eat a banana she should be good.

## 2023-07-25 NOTE — Telephone Encounter (Signed)
 Pt notified of Dr. Elfredia comments, lab appt cancelled. Pt has f/u appt with ortho on 08/12/23 if for some reason they don't do repeat labs she will call to get them done here

## 2023-07-25 NOTE — Telephone Encounter (Signed)
 [facility] RNCM pre-op  call completed.

## 2023-07-25 NOTE — Telephone Encounter (Signed)
 Pt notified of PCP's comment

## 2023-07-25 NOTE — Telephone Encounter (Signed)
 This should be a reasonable substitution.  Thanks.

## 2023-07-25 NOTE — Telephone Encounter (Signed)
 I was notified about the prev message today.  She shouldn't need repeat labs today but her K and HGB are still slightly low.  I am deferring to ortho about the decision for surgery based on these labs.    She'll likely need post op labs here at clinic.    I thank all involved.

## 2023-07-26 ENCOUNTER — Encounter (HOSPITAL_COMMUNITY)
Admission: RE | Disposition: A | Discharge status: Home / Self Care | Payer: Self-pay | Source: Home / Self Care | Attending: Orthopedic Surgery

## 2023-07-26 ENCOUNTER — Ambulatory Visit (HOSPITAL_COMMUNITY): Payer: Self-pay | Admitting: Physician Assistant

## 2023-07-26 ENCOUNTER — Ambulatory Visit (HOSPITAL_BASED_OUTPATIENT_CLINIC_OR_DEPARTMENT_OTHER): Payer: Self-pay | Admitting: Anesthesiology

## 2023-07-26 ENCOUNTER — Encounter (HOSPITAL_COMMUNITY): Payer: Self-pay | Admitting: Orthopedic Surgery

## 2023-07-26 ENCOUNTER — Observation Stay (HOSPITAL_COMMUNITY)
Admission: RE | Admit: 2023-07-26 | Discharge: 2023-07-28 | Disposition: A | Discharge status: Home / Self Care | Attending: Orthopedic Surgery | Admitting: Orthopedic Surgery

## 2023-07-26 ENCOUNTER — Other Ambulatory Visit: Payer: Self-pay

## 2023-07-26 DIAGNOSIS — G8918 Other acute postprocedural pain: Secondary | ICD-10-CM | POA: Diagnosis not present

## 2023-07-26 DIAGNOSIS — Z7982 Long term (current) use of aspirin: Secondary | ICD-10-CM | POA: Diagnosis not present

## 2023-07-26 DIAGNOSIS — Z01818 Encounter for other preprocedural examination: Principal | ICD-10-CM

## 2023-07-26 DIAGNOSIS — I10 Essential (primary) hypertension: Secondary | ICD-10-CM | POA: Insufficient documentation

## 2023-07-26 DIAGNOSIS — M1710 Unilateral primary osteoarthritis, unspecified knee: Secondary | ICD-10-CM | POA: Diagnosis present

## 2023-07-26 DIAGNOSIS — Z87891 Personal history of nicotine dependence: Secondary | ICD-10-CM | POA: Diagnosis not present

## 2023-07-26 DIAGNOSIS — M1712 Unilateral primary osteoarthritis, left knee: Secondary | ICD-10-CM | POA: Diagnosis not present

## 2023-07-26 HISTORY — PX: TOTAL KNEE ARTHROPLASTY: SHX125

## 2023-07-26 SURGERY — ARTHROPLASTY, KNEE, TOTAL
Anesthesia: Spinal | Site: Knee | Laterality: Left

## 2023-07-26 MED ORDER — METHOCARBAMOL 500 MG PO TABS
500.0000 mg | ORAL_TABLET | Freq: Four times a day (QID) | ORAL | Status: DC | PRN
  Administered 2023-07-27: 500 mg via ORAL
  Filled 2023-07-26 (×2): qty 1

## 2023-07-26 MED ORDER — POTASSIUM CHLORIDE IN NACL 20-0.9 MEQ/L-% IV SOLN
INTRAVENOUS | Status: AC
  Filled 2023-07-26: qty 1000

## 2023-07-26 MED ORDER — OXYCODONE HCL 5 MG PO TABS: 5.0000 mg | ORAL_TABLET | Freq: Once | ORAL | Status: DC | PRN

## 2023-07-26 MED ORDER — ROPIVACAINE HCL 7.5 MG/ML IJ SOLN
INTRAMUSCULAR | Status: DC | PRN
  Administered 2023-07-26: 20 mL via PERINEURAL

## 2023-07-26 MED ORDER — BUPIVACAINE IN DEXTROSE 0.75-8.25 % IT SOLN
INTRATHECAL | Status: DC | PRN
  Administered 2023-07-26: 1.4 mL via INTRATHECAL

## 2023-07-26 MED ORDER — CALCIUM CARBONATE ANTACID 500 MG PO CHEW
400.0000 mg | CHEWABLE_TABLET | Freq: Four times a day (QID) | ORAL | Status: DC | PRN
  Administered 2023-07-26: 400 mg via ORAL
  Filled 2023-07-26: qty 2

## 2023-07-26 MED ORDER — POVIDONE-IODINE 10 % EX SWAB
2.0000 | Freq: Once | CUTANEOUS | Status: AC
  Administered 2023-07-26: 2 via TOPICAL

## 2023-07-26 MED ORDER — 0.9 % SODIUM CHLORIDE (POUR BTL) OPTIME
TOPICAL | Status: DC | PRN
  Administered 2023-07-26 (×4): 1000 mL

## 2023-07-26 MED ORDER — BUPIVACAINE HCL 0.25 % IJ SOLN
INTRAMUSCULAR | Status: DC | PRN
Start: 2023-07-26 — End: 2023-07-26
  Administered 2023-07-26: 30 mL

## 2023-07-26 MED ORDER — ACETAMINOPHEN 500 MG PO TABS
1000.0000 mg | ORAL_TABLET | Freq: Once | ORAL | Status: AC
  Administered 2023-07-26: 1000 mg via ORAL
  Filled 2023-07-26: qty 2

## 2023-07-26 MED ORDER — POTASSIUM CITRATE ER 10 MEQ (1080 MG) PO TBCR
10.0000 meq | EXTENDED_RELEASE_TABLET | Freq: Every day | ORAL | Status: DC
  Administered 2023-07-26 – 2023-07-28 (×3): 10 meq via ORAL
  Filled 2023-07-26 (×3): qty 1

## 2023-07-26 MED ORDER — ALBUMIN HUMAN 5 % IV SOLN: INTRAVENOUS | Status: DC | PRN

## 2023-07-26 MED ORDER — SODIUM CHLORIDE (PF) 0.9 % IJ SOLN
INTRAMUSCULAR | Status: AC
Start: 2023-07-26 — End: 2023-07-26
  Filled 2023-07-26: qty 10

## 2023-07-26 MED ORDER — BUPIVACAINE LIPOSOME 1.3 % IJ SUSP
INTRAMUSCULAR | Status: DC | PRN
  Administered 2023-07-26: 20 mL

## 2023-07-26 MED ORDER — CLONIDINE HCL (ANALGESIA) 100 MCG/ML EP SOLN
EPIDURAL | Status: AC
  Filled 2023-07-26: qty 10

## 2023-07-26 MED ORDER — VANCOMYCIN HCL 1000 MG IV SOLR
INTRAVENOUS | Status: AC
  Filled 2023-07-26: qty 20

## 2023-07-26 MED ORDER — CHLORHEXIDINE GLUCONATE 0.12 % MT SOLN
15.0000 mL | Freq: Once | OROMUCOSAL | Status: AC
  Administered 2023-07-26: 15 mL via OROMUCOSAL
  Filled 2023-07-26: qty 15

## 2023-07-26 MED ORDER — LACTATED RINGERS IV SOLN: INTRAVENOUS | Status: DC

## 2023-07-26 MED ORDER — CLONIDINE HCL (ANALGESIA) 100 MCG/ML EP SOLN
EPIDURAL | Status: DC | PRN
  Administered 2023-07-26: 1 mL

## 2023-07-26 MED ORDER — DOCUSATE SODIUM 100 MG PO CAPS
100.0000 mg | ORAL_CAPSULE | Freq: Two times a day (BID) | ORAL | Status: DC
  Administered 2023-07-26 – 2023-07-28 (×5): 100 mg via ORAL
  Filled 2023-07-26 (×5): qty 1

## 2023-07-26 MED ORDER — MORPHINE SULFATE (PF) 2 MG/ML IV SOLN: 0.5000 mg | INTRAVENOUS | Status: DC | PRN

## 2023-07-26 MED ORDER — PROPOFOL 10 MG/ML IV BOLUS
INTRAVENOUS | Status: AC
  Filled 2023-07-26: qty 20

## 2023-07-26 MED ORDER — POVIDONE-IODINE 7.5 % EX SOLN
Freq: Once | CUTANEOUS | Status: DC
  Filled 2023-07-26: qty 118

## 2023-07-26 MED ORDER — ONDANSETRON HCL 4 MG/2ML IJ SOLN: 4.0000 mg | Freq: Once | INTRAMUSCULAR | Status: DC | PRN

## 2023-07-26 MED ORDER — FENTANYL CITRATE (PF) 250 MCG/5ML IJ SOLN
INTRAMUSCULAR | Status: DC | PRN
  Administered 2023-07-26: 50 ug via INTRAVENOUS
  Administered 2023-07-26: 25 ug via INTRAVENOUS

## 2023-07-26 MED ORDER — MORPHINE SULFATE (PF) 4 MG/ML IV SOLN
INTRAVENOUS | Status: AC
  Filled 2023-07-26: qty 1

## 2023-07-26 MED ORDER — METOCLOPRAMIDE HCL 5 MG/ML IJ SOLN: 5.0000 mg | Freq: Three times a day (TID) | INTRAMUSCULAR | Status: DC | PRN

## 2023-07-26 MED ORDER — SODIUM CHLORIDE 0.9 % IR SOLN
Status: DC | PRN
  Administered 2023-07-26: 3000 mL

## 2023-07-26 MED ORDER — VANCOMYCIN HCL 1000 MG IV SOLR
INTRAVENOUS | Status: DC | PRN
Start: 2023-07-26 — End: 2023-07-26
  Administered 2023-07-26: 1000 mg

## 2023-07-26 MED ORDER — AMLODIPINE BESYLATE 5 MG PO TABS
2.5000 mg | ORAL_TABLET | Freq: Every day | ORAL | Status: DC
  Administered 2023-07-27 – 2023-07-28 (×2): 2.5 mg via ORAL
  Filled 2023-07-26 (×2): qty 1

## 2023-07-26 MED ORDER — BUPIVACAINE LIPOSOME 1.3 % IJ SUSP
INTRAMUSCULAR | Status: AC
  Filled 2023-07-26: qty 20

## 2023-07-26 MED ORDER — ONDANSETRON HCL 4 MG PO TABS
4.0000 mg | ORAL_TABLET | Freq: Four times a day (QID) | ORAL | Status: DC | PRN
  Administered 2023-07-27 (×2): 4 mg via ORAL
  Filled 2023-07-26 (×2): qty 1

## 2023-07-26 MED ORDER — FENTANYL CITRATE (PF) 100 MCG/2ML IJ SOLN
INTRAMUSCULAR | Status: AC
  Filled 2023-07-26: qty 2

## 2023-07-26 MED ORDER — HYDROCODONE-ACETAMINOPHEN 5-325 MG PO TABS
1.0000 | ORAL_TABLET | ORAL | Status: DC | PRN
  Administered 2023-07-26 – 2023-07-27 (×2): 2 via ORAL
  Filled 2023-07-26 (×3): qty 2

## 2023-07-26 MED ORDER — ONDANSETRON HCL 4 MG/2ML IJ SOLN
INTRAMUSCULAR | Status: DC | PRN
  Administered 2023-07-26: 4 mg via INTRAVENOUS

## 2023-07-26 MED ORDER — FENTANYL CITRATE (PF) 250 MCG/5ML IJ SOLN
INTRAMUSCULAR | Status: AC
  Filled 2023-07-26: qty 5

## 2023-07-26 MED ORDER — SIMVASTATIN 20 MG PO TABS
10.0000 mg | ORAL_TABLET | ORAL | Status: DC
  Administered 2023-07-26 – 2023-07-28 (×2): 10 mg via ORAL
  Filled 2023-07-26 (×2): qty 1

## 2023-07-26 MED ORDER — SODIUM CHLORIDE (PF) 0.9 % IJ SOLN
INTRAMUSCULAR | Status: DC | PRN
  Administered 2023-07-26 (×2): 10 mL

## 2023-07-26 MED ORDER — PROPOFOL 500 MG/50ML IV EMUL
INTRAVENOUS | Status: DC | PRN
Start: 2023-07-26 — End: 2023-07-26
  Administered 2023-07-26: 75 ug/kg/min via INTRAVENOUS

## 2023-07-26 MED ORDER — ALUM & MAG HYDROXIDE-SIMETH 200-200-20 MG/5ML PO SUSP
30.0000 mL | Freq: Four times a day (QID) | ORAL | Status: DC | PRN
  Administered 2023-07-27: 30 mL via ORAL
  Filled 2023-07-26: qty 30

## 2023-07-26 MED ORDER — MORPHINE SULFATE (PF) 4 MG/ML IV SOLN
INTRAVENOUS | Status: DC | PRN
  Administered 2023-07-26 (×2): 4 mg

## 2023-07-26 MED ORDER — TRANEXAMIC ACID 1000 MG/10ML IV SOLN
INTRAVENOUS | Status: DC | PRN
  Administered 2023-07-26: 2000 mg via TOPICAL

## 2023-07-26 MED ORDER — TRANEXAMIC ACID-NACL 1000-0.7 MG/100ML-% IV SOLN
1000.0000 mg | INTRAVENOUS | Status: AC
Start: 2023-07-26 — End: 2023-07-26
  Administered 2023-07-26: 1000 mg via INTRAVENOUS
  Filled 2023-07-26: qty 100

## 2023-07-26 MED ORDER — ONDANSETRON HCL 4 MG/2ML IJ SOLN: 4.0000 mg | Freq: Four times a day (QID) | INTRAMUSCULAR | Status: DC | PRN

## 2023-07-26 MED ORDER — METHOCARBAMOL 1000 MG/10ML IJ SOLN: 500.0000 mg | Freq: Four times a day (QID) | INTRAMUSCULAR | Status: DC | PRN

## 2023-07-26 MED ORDER — DEXAMETHASONE SODIUM PHOSPHATE 10 MG/ML IJ SOLN
INTRAMUSCULAR | Status: AC
  Filled 2023-07-26: qty 1

## 2023-07-26 MED ORDER — METOCLOPRAMIDE HCL 5 MG PO TABS: 5.0000 mg | ORAL_TABLET | Freq: Three times a day (TID) | ORAL | Status: DC | PRN

## 2023-07-26 MED ORDER — ASPIRIN 81 MG PO CHEW
81.0000 mg | CHEWABLE_TABLET | Freq: Every day | ORAL | Status: DC
  Administered 2023-07-26 – 2023-07-28 (×2): 81 mg via ORAL
  Filled 2023-07-26 (×2): qty 1

## 2023-07-26 MED ORDER — PHENOL 1.4 % MT LIQD: 1.0000 | OROMUCOSAL | Status: DC | PRN

## 2023-07-26 MED ORDER — DEXAMETHASONE SODIUM PHOSPHATE 10 MG/ML IJ SOLN
INTRAMUSCULAR | Status: DC | PRN
  Administered 2023-07-26: 5 mg via INTRAVENOUS

## 2023-07-26 MED ORDER — CEFAZOLIN SODIUM-DEXTROSE 2-4 GM/100ML-% IV SOLN
2.0000 g | INTRAVENOUS | Status: AC
  Administered 2023-07-26: 2 g via INTRAVENOUS
  Filled 2023-07-26: qty 100

## 2023-07-26 MED ORDER — ORAL CARE MOUTH RINSE
15.0000 mL | Freq: Once | OROMUCOSAL | Status: AC
Start: 2023-07-26 — End: 2023-07-26

## 2023-07-26 MED ORDER — BUPIVACAINE HCL (PF) 0.25 % IJ SOLN
INTRAMUSCULAR | Status: AC
  Filled 2023-07-26: qty 30

## 2023-07-26 MED ORDER — FENTANYL CITRATE (PF) 100 MCG/2ML IJ SOLN
25.0000 ug | INTRAMUSCULAR | Status: DC | PRN
  Administered 2023-07-26: 50 ug via INTRAVENOUS

## 2023-07-26 MED ORDER — INDAPAMIDE 2.5 MG PO TABS
2.5000 mg | ORAL_TABLET | Freq: Every day | ORAL | Status: DC
  Administered 2023-07-28: 2.5 mg via ORAL
  Filled 2023-07-26 (×2): qty 1

## 2023-07-26 MED ORDER — OXYCODONE HCL 5 MG/5ML PO SOLN: 5.0000 mg | Freq: Once | ORAL | Status: DC | PRN

## 2023-07-26 MED ORDER — PHENYLEPHRINE HCL-NACL 20-0.9 MG/250ML-% IV SOLN
INTRAVENOUS | Status: DC | PRN
  Administered 2023-07-26: 25 ug/min via INTRAVENOUS

## 2023-07-26 MED ORDER — SODIUM CHLORIDE 0.9% FLUSH: INTRAVENOUS | Status: DC | PRN

## 2023-07-26 MED ORDER — VITAMIN D 25 MCG (1000 UNIT) PO TABS
4000.0000 [IU] | ORAL_TABLET | Freq: Every day | ORAL | Status: DC
  Administered 2023-07-26 – 2023-07-27 (×2): 4000 [IU] via ORAL
  Filled 2023-07-26 (×3): qty 4

## 2023-07-26 MED ORDER — ONDANSETRON HCL 4 MG/2ML IJ SOLN
INTRAMUSCULAR | Status: AC
  Filled 2023-07-26: qty 2

## 2023-07-26 MED ORDER — IRRISEPT - 450ML BOTTLE WITH 0.05% CHG IN STERILE WATER, USP 99.95% OPTIME
TOPICAL | Status: DC | PRN
  Administered 2023-07-26: 450 mL via TOPICAL

## 2023-07-26 MED ORDER — CEFAZOLIN SODIUM-DEXTROSE 2-4 GM/100ML-% IV SOLN
2.0000 g | Freq: Four times a day (QID) | INTRAVENOUS | Status: AC
  Administered 2023-07-26 (×2): 2 g via INTRAVENOUS
  Filled 2023-07-26 (×2): qty 100

## 2023-07-26 MED ORDER — PROPOFOL 10 MG/ML IV BOLUS
INTRAVENOUS | Status: DC | PRN
  Administered 2023-07-26: 40 mg via INTRAVENOUS
  Administered 2023-07-26: 30 mg via INTRAVENOUS

## 2023-07-26 MED ORDER — MENTHOL 3 MG MT LOZG: 1.0000 | LOZENGE | OROMUCOSAL | Status: DC | PRN

## 2023-07-26 SURGICAL SUPPLY — 69 items
BAG COUNTER SPONGE SURGICOUNT (BAG) ×1 IMPLANT
BAG DECANTER FOR FLEXI CONT (MISCELLANEOUS) ×1 IMPLANT
BANDAGE ESMARK 6X9 LF (GAUZE/BANDAGES/DRESSINGS) ×1 IMPLANT
BIT DRILL QUICK REL 1/8 2PK SL (BIT) IMPLANT
BLADE SAG 18X100X1.27 (BLADE) ×1 IMPLANT
BLADE SAW SAG 90X13X1.27 (BLADE) ×1 IMPLANT
BNDG COHESIVE 6X5 TAN ST LF (GAUZE/BANDAGES/DRESSINGS) ×1 IMPLANT
BNDG ELASTIC 6X15 VLCR STRL LF (GAUZE/BANDAGES/DRESSINGS) ×1 IMPLANT
BOWL SMART MIX CTS (DISPOSABLE) IMPLANT
CEMENT BONE SIMPLEX SPEEDSET (Cement) IMPLANT
CNTNR URN SCR LID CUP LEK RST (MISCELLANEOUS) ×1 IMPLANT
COMPONENT FEM CMT PS N 4 LT (Joint) IMPLANT
COMPONENT TIB CMT KNEE C NP LT (Joint) IMPLANT
COVER SURGICAL LIGHT HANDLE (MISCELLANEOUS) ×1 IMPLANT
CUFF TOURN SGL QUICK 42 (TOURNIQUET CUFF) IMPLANT
CUFF TRNQT CYL 34X4.125X (TOURNIQUET CUFF) ×1 IMPLANT
DRAPE INCISE IOBAN 66X45 STRL (DRAPES) IMPLANT
DRAPE SURG ORHT 6 SPLT 77X108 (DRAPES) ×3 IMPLANT
DRAPE U-SHAPE 47X51 STRL (DRAPES) ×1 IMPLANT
DRSG AQUACEL AG ADV 3.5X14 (GAUZE/BANDAGES/DRESSINGS) IMPLANT
DURAPREP 26ML APPLICATOR (WOUND CARE) ×2 IMPLANT
ELECT CAUTERY BLADE 6.4 (BLADE) ×1 IMPLANT
ELECTRODE REM PT RTRN 9FT ADLT (ELECTROSURGICAL) ×1 IMPLANT
GAUZE SPONGE 4X4 12PLY STRL (GAUZE/BANDAGES/DRESSINGS) ×1 IMPLANT
GLOVE BIOGEL PI IND STRL 6.5 (GLOVE) ×1 IMPLANT
GLOVE BIOGEL PI IND STRL 7.0 (GLOVE) ×1 IMPLANT
GLOVE BIOGEL PI IND STRL 8 (GLOVE) ×1 IMPLANT
GLOVE ECLIPSE 7.0 STRL STRAW (GLOVE) ×1 IMPLANT
GLOVE ECLIPSE 8.0 STRL XLNG CF (GLOVE) ×1 IMPLANT
GLOVE SURG ENC MOIS LTX SZ6.5 (GLOVE) ×3 IMPLANT
GOWN STRL REUS W/ TWL LRG LVL3 (GOWN DISPOSABLE) ×3 IMPLANT
HOOD PEEL AWAY T7 (MISCELLANEOUS) ×3 IMPLANT
IMMOBILIZER KNEE 20 THIGH 36 (SOFTGOODS) IMPLANT
IMMOBILIZER KNEE 22 UNIV (SOFTGOODS) IMPLANT
IMMOBILIZER KNEE 24 THIGH 36 (SOFTGOODS) IMPLANT
INSERT AS PERS 4-5 C-D 12 LT (Insert) IMPLANT
KIT BASIN OR (CUSTOM PROCEDURE TRAY) ×1 IMPLANT
KIT TURNOVER KIT B (KITS) ×1 IMPLANT
MANIFOLD NEPTUNE II (INSTRUMENTS) ×1 IMPLANT
NDL 22X1.5 STRL (OR ONLY) (MISCELLANEOUS) ×2 IMPLANT
NDL SPNL 18GX3.5 QUINCKE PK (NEEDLE) ×1 IMPLANT
NEEDLE 22X1.5 STRL (OR ONLY) (MISCELLANEOUS) ×2 IMPLANT
NEEDLE SPNL 18GX3.5 QUINCKE PK (NEEDLE) ×1 IMPLANT
NS IRRIG 1000ML POUR BTL (IV SOLUTION) ×2 IMPLANT
PACK TOTAL JOINT (CUSTOM PROCEDURE TRAY) ×1 IMPLANT
PAD ARMBOARD POSITIONER FOAM (MISCELLANEOUS) ×2 IMPLANT
PAD CAST 4YDX4 CTTN HI CHSV (CAST SUPPLIES) ×1 IMPLANT
PADDING CAST COTTON 6X4 STRL (CAST SUPPLIES) ×1 IMPLANT
PIN DRILL HDLS TROCAR 75 4PK (PIN) IMPLANT
SCREW FEMALE HEX FIX 25X2.5 (ORTHOPEDIC DISPOSABLE SUPPLIES) IMPLANT
SET HNDPC FAN SPRY TIP SCT (DISPOSABLE) ×1 IMPLANT
SPIKE FLUID TRANSFER (MISCELLANEOUS) ×1 IMPLANT
SPONGE T-LAP 18X18 ~~LOC~~+RFID (SPONGE) IMPLANT
STEM POLY PAT PLY 29M KNEE (Knees) IMPLANT
STRIP CLOSURE SKIN 1/2X4 (GAUZE/BANDAGES/DRESSINGS) ×2 IMPLANT
SUCTION TUBE FRAZIER 10FR DISP (SUCTIONS) ×1 IMPLANT
SUT MNCRL AB 3-0 PS2 18 (SUTURE) ×1 IMPLANT
SUT VIC AB 0 CT1 27XBRD ANBCTR (SUTURE) ×3 IMPLANT
SUT VIC AB 1 CT1 36 (SUTURE) ×5 IMPLANT
SUT VIC AB 2-0 CT2 27 (SUTURE) ×4 IMPLANT
SUT VICRYL 0 27 CT2 27 ABS (SUTURE) ×1 IMPLANT
SYR 30ML LL (SYRINGE) ×3 IMPLANT
SYR TB 1ML LUER SLIP (SYRINGE) ×1 IMPLANT
TOWEL GREEN STERILE (TOWEL DISPOSABLE) ×2 IMPLANT
TOWEL GREEN STERILE FF (TOWEL DISPOSABLE) ×2 IMPLANT
TRAY CATH INTERMITTENT SS 16FR (CATHETERS) IMPLANT
TUBE SUCT ARGYLE STRL (TUBING) IMPLANT
WATER STERILE IRR 1000ML POUR (IV SOLUTION) IMPLANT
YANKAUER SUCT BULB TIP NO VENT (SUCTIONS) ×1 IMPLANT

## 2023-07-26 NOTE — Plan of Care (Signed)

## 2023-07-26 NOTE — Evaluation (Signed)
 Physical Therapy Evaluation Patient Details Name: Amorie Rentz MRN: [id number] DOB: 1945-07-08 Today's Date: 07/26/2023  History of Present Illness  78 yo female s/p L TKR 07/25/23. PMH includes lower GIB, atherosclerosis, diverticulosis, HTN, HLD.  Clinical Impression   Pt presents with mild L knee stiffness during ROM, increased time and effort to mobilize s/p TKR, anticipated post-op weakness, and decreased activity tolerance vs baseline. Pt to benefit from acute PT to address deficits. Pt ambulated hallway distance with use of RW and close guard for safety, cues for form and safety. Pt educated on ankle pumps (20/hour) to perform this afternoon/evening to increase circulation, to pt's tolerance and limited by pain. PT to progress mobility as tolerated, and will continue to follow acutely.            If plan is discharge home, recommend the following: A little help with walking and/or transfers;A little help with bathing/dressing/bathroom   Can travel by private vehicle        Equipment Recommendations None recommended by PT  Recommendations for Other Services       Functional Status Assessment Patient has had a recent decline in their functional status and demonstrates the ability to make significant improvements in function in a reasonable and predictable amount of time.     Precautions / Restrictions Precautions Precautions: Fall Required Braces or Orthoses: Knee Immobilizer - Left Knee Immobilizer - Left: On when out of bed or walking;Discontinue once straight leg raise with < 10 degree lag Restrictions Weight Bearing Restrictions Per Provider Order: Yes LLE Weight Bearing Per Provider Order: Weight bearing as tolerated      Mobility  Bed Mobility Overal bed mobility: Needs Assistance Bed Mobility: Supine to Sit     Supine to sit: Min assist, HOB elevated     General bed mobility comments: light assist for LE progression OOB    Transfers Overall transfer level:  Needs assistance Equipment used: Rolling walker (2 wheels) Transfers: Sit to/from Stand Sit to Stand: Contact guard assist           General transfer comment: for safety, cues for correct hand placement. stand x2, from EOB and toilet    Ambulation/Gait Ambulation/Gait assistance: Contact guard assist Gait Distance (Feet): 100 Feet Assistive device: Rolling walker (2 wheels) Gait Pattern/deviations: Step-to pattern, Decreased step length - left, Trunk flexed Gait velocity: decr     General Gait Details: cues for upright posture, placement in RW, sequencing  Stairs            Wheelchair Mobility     Tilt Bed    Modified Rankin (Stroke Patients Only)       Balance Overall balance assessment: Needs assistance Sitting-balance support: No upper extremity supported, Feet supported Sitting balance-Leahy Scale: Good     Standing balance support: Bilateral upper extremity supported, During functional activity, Reliant on assistive device for balance Standing balance-Leahy Scale: Fair                               Pertinent Vitals/Pain Pain Assessment Pain Assessment: 0-10 Pain Score: 0-No pain Pain Intervention(s): Monitored during session    Home Living Family/patient expects to be discharged to:: Private residence Living Arrangements: Children (daughter) Available Help at Discharge: Family Type of Home: House Home Access: Stairs to enter   Secretary/administrator of Steps: 1 (threshold) Alternate Level Stairs-Number of Steps: flight Home Layout: Two level;1/2 bath on main level Home Equipment: Cane - single  point;Rolling Walker (2 wheels) Additional Comments: CPM    Prior Function Prior Level of Function : Independent/Modified Independent             Mobility Comments: pt using cane PRN given stiffness and pain. can stay on first floor of home and sponge bathe if needed, states they have turned the dining room into her room for now        Extremity/Trunk Assessment   Upper Extremity Assessment Upper Extremity Assessment: Defer to OT evaluation    Lower Extremity Assessment Lower Extremity Assessment: Overall WFL for tasks assessed;LLE deficits/detail LLE Deficits / Details: anticipated post-operative weakness; able to perform ankle pumps, quad set, heel slide to 35 deg limited by pain, SLR without quad lag LLE Sensation: WNL    Cervical / Trunk Assessment Cervical / Trunk Assessment: Normal  Communication   Communication Communication: No apparent difficulties    Cognition Arousal: Alert Behavior During Therapy: WFL for tasks assessed/performed   PT - Cognitive impairments: No apparent impairments                         Following commands: Intact       Cueing Cueing Techniques: Verbal cues, Gestural cues     General Comments      Exercises Total Joint Exercises Ankle Circles/Pumps: AROM, Both, 5 reps, Supine Quad Sets: AROM, Left, 5 reps, Supine Heel Slides: AAROM, Left, 5 reps, Supine Goniometric ROM: L knee aarom 0-35 deg limited by pain and stiffness   Assessment/Plan    PT Assessment Patient needs continued PT services  PT Problem List Decreased strength;Decreased mobility;Decreased safety awareness;Decreased activity tolerance;Decreased balance;Decreased knowledge of use of DME;Pain       PT Treatment Interventions Therapeutic activities;DME instruction;Gait training;Therapeutic exercise;Patient/family education;Balance training;Stair training;Functional mobility training;Neuromuscular re-education    PT Goals (Current goals can be found in the Care Plan section)  Acute Rehab PT Goals Patient Stated Goal: home PT Goal Formulation: With patient Time For Goal Achievement: 08/02/23 Potential to Achieve Goals: Good    Frequency 7X/week     Co-evaluation               AM-PAC PT 6 Clicks Mobility  Outcome Measure Help needed turning from your back to your side  while in a flat bed without using bedrails?: A Little Help needed moving from lying on your back to sitting on the side of a flat bed without using bedrails?: A Little Help needed moving to and from a bed to a chair (including a wheelchair)?: A Little Help needed standing up from a chair using your arms (e.g., wheelchair or bedside chair)?: A Little Help needed to walk in hospital room?: A Little Help needed climbing 3-5 steps with a railing? : A Little 6 Click Score: 18    End of Session Equipment Utilized During Treatment: Gait belt;Left knee immobilizer Activity Tolerance: Patient limited by fatigue Patient left: in chair;with call bell/phone within reach Nurse Communication: Mobility status PT Visit Diagnosis: Other abnormalities of gait and mobility (R26.89);Muscle weakness (generalized) (M62.81)    Time: 8442-8374 PT Time Calculation (min) (ACUTE ONLY): 28 min   Charges:   PT Evaluation $PT Eval Low Complexity: 1 Low PT Treatments $Gait Training: 8-22 mins PT General Charges $$ ACUTE PT VISIT: 1 Visit         Johana RAMAN, PT DPT Acute Rehabilitation Services Secure Chat Preferred  Office [phone]   Terrea Bruster FORBES [place] 07/26/2023, 4:52 PM

## 2023-07-26 NOTE — Anesthesia Procedure Notes (Signed)
 Anesthesia Regional Block: Adductor canal block   Pre-Anesthetic Checklist: , timeout performed,  Correct Patient, Correct Site, Correct Laterality,  Correct Procedure, Correct Position, site marked,  Risks and benefits discussed,  Surgical consent,  Pre-op evaluation,  At surgeon's request and post-op pain management  Laterality: Left  Prep: chloraprep       Needles:  Injection technique: Single-shot  Needle Type: Echogenic Needle     Needle Length: 10cm  Needle Gauge: 21     Additional Needles:   Narrative:  Start time: 07/26/2023 7:12 AM End time: 07/26/2023 7:14 AM Injection made incrementally with aspirations every 5 mL.  Performed by: Personally  Anesthesiologist: Lucious Debby BRAVO, MD  Additional Notes: No pain on injection. No increased resistance to injection. Injection made in 5cc increments. Good needle visualization. Patient tolerated the procedure well.

## 2023-07-26 NOTE — H&P (Signed)
 TOTAL KNEE ADMISSION H&P  Patient is being admitted for right total knee arthroplasty.  Subjective:  Chief Complaint:left knee pain.  HPI: Patricia Hoover, 78 y.o. female, has a history of pain and functional disability in the left knee due to arthritis and has failed non-surgical conservative treatments for greater than 12 weeks to includeNSAID's and/or analgesics, corticosteriod injections, flexibility and strengthening excercises, and activity modification.  Onset of symptoms was gradual, starting >10 years ago with gradually worsening course since that time. The patient noted no past surgery on the left knee(s).  Patient currently rates pain in the left knee(s) at 8 out of 10 with activity. Patient has night pain, worsening of pain with activity and weight bearing, pain that interferes with activities of daily living, pain with passive range of motion, crepitus, and joint swelling.  Patient has evidence of subchondral sclerosis and joint space narrowing by imaging studies. This patient has had a long history of knee pain with failure of conservative treatment.. There is no active infection.  Patient Active Problem List   Diagnosis Date Noted   Lower GI bleeding 06/29/2023   Hypokalemia 03/09/2023   Aortic atherosclerosis (HCC) 03/09/2023   Unilateral primary osteoarthritis, left knee 11/26/2022   Dysuria 04/21/2022   Stiff-legged gait 04/21/2022   Scalp lesion 09/21/2021   Clavicle enlargement 04/05/2021   Hyperpigmentation 03/01/2021   Vitamin D  deficiency 03/01/2021   Leg pain 05/21/2019   Right leg pain 05/21/2019   GI bleed 03/04/2018   Healthcare maintenance 01/08/2018   Paresthesia 01/08/2018   Neck pain 08/31/2017   Angioedema 06/15/2017   External hemorrhoids    Diverticulosis of large intestine without diverticulitis    Internal hemorrhoids    Advance care planning 12/23/2016   Renal stone 04/20/2016   Heartburn 12/17/2015   Medicare annual wellness visit, subsequent  06/05/2013   Hyperglycemia 06/05/2013   Knee pain 08/31/2012   Age-related osteoporosis without current pathological fracture 08/18/2012   HTN (hypertension) 08/09/2012   HLD (hyperlipidemia) 08/09/2012   Past Medical History:  Diagnosis Date   Angioedema 06/15/2017   Arrhythmia    possible hx, resolved prev   Blood transfusion without reported diagnosis 1972   had reaction; had to stop   Carpal tunnel syndrome    Cataract 2019   resolved with surgery   Complication of anesthesia    takes a long to wake up   Duodenal ulcer    H/O exercise stress test 2012   normal   Heart murmur    as child   Hematochezia    High cholesterol    History of kidney stones    Hx of colonic polyps    Hypertension    Osteoporosis 2010   t score - 3.9   Peptic ulcer of duodenum    Renal stones    Vitamin D  deficiency 03/01/2021    Past Surgical History:  Procedure Laterality Date   ABDOMINAL HYSTERECTOMY  1972   for endometriosis   CATARACT EXTRACTION W/ INTRAOCULAR LENS IMPLANT Right 06/2017   CATARACT EXTRACTION W/ INTRAOCULAR LENS IMPLANT Left 07/2017   COLONOSCOPY Left 03/02/2017   Procedure: COLONOSCOPY;  Surgeon: Janalyn Keene NOVAK, MD;  Location: [facility] ENDOSCOPY;  Service: Endoscopy;  Laterality: Left;   COLONOSCOPY WITH PROPOFOL  N/A 05/04/2016   Procedure: COLONOSCOPY WITH PROPOFOL ;  Surgeon: Ruel Kung, MD;  Location: [facility] ENDOSCOPY;  Service: Endoscopy;  Laterality: N/A;   ESOPHAGOGASTRODUODENOSCOPY Left 03/02/2017   Procedure: ESOPHAGOGASTRODUODENOSCOPY (EGD);  Surgeon: Janalyn Keene NOVAK, MD;  Location: [facility] ENDOSCOPY;  Service: Endoscopy;  Laterality: Left;   ESOPHAGOGASTRODUODENOSCOPY (EGD) WITH PROPOFOL  N/A 04/08/2017   Procedure: ESOPHAGOGASTRODUODENOSCOPY (EGD) WITH PROPOFOL ;  Surgeon: Janalyn Keene NOVAK, MD;  Location: [facility] ENDOSCOPY;  Service: Endoscopy;  Laterality: N/A;   LITHOTRIPSY     for kidney stone x5   LITHOTRIPSY  09/2017    Current Facility-Administered  Medications  Medication Dose Route Frequency Provider Last Rate Last Admin   ceFAZolin  (ANCEF ) IVPB 2g/100 mL premix  2 g Intravenous On Call to OR Magnant, Carlin CROME, PA-C       lactated ringers  infusion   Intravenous Continuous Lucious Debby BRAVO, MD       povidone-iodine  (BETADINE ) 7.5 % scrub   Topical Once Magnant, Charles L, PA-C       tranexamic acid  (CYKLOKAPRON ) 2,000 mg in sodium chloride  0.9 % 50 mL Topical Application  2,000 mg Topical To OR Addie, Cordella Hamilton, MD       tranexamic acid  (CYKLOKAPRON ) IVPB 1,000 mg  1,000 mg Intravenous To OR Magnant, Charles L, PA-C       Allergies  Allergen Reactions   Ace Inhibitors Swelling   Angiotensin Receptor Blockers     Would avoid, h/o swelling with ACE prev   Aspirin  Other (See Comments)    GI bleed   Bisphosphonates     GI side effects   Nsaids     GI bleed   Prolia [Denosumab]     GI upset    Social History   Tobacco Use   Smoking status: Former    Current packs/day: 0.00    Types: Cigarettes    Quit date: 01/19/1991    Years since quitting: 32.5   Smokeless tobacco: Never  Substance Use Topics   Alcohol use: No    Comment: rare wine    Family History  Problem Relation Age of Onset   Heart disease Mother    Renal Disease Mother    Heart failure Mother    Colon cancer Neg Hx    Breast cancer Neg Hx      Review of Systems  Musculoskeletal:  Positive for arthralgias.  All other systems reviewed and are negative.   Objective:  Physical Exam Vitals reviewed.  HENT:     Head: Normocephalic.     Nose: Nose normal.     Mouth/Throat:     Mouth: Mucous membranes are moist.  Eyes:     Pupils: Pupils are equal, round, and reactive to light.  Cardiovascular:     Rate and Rhythm: Normal rate.     Pulses: Normal pulses.  Pulmonary:     Effort: Pulmonary effort is normal.  Abdominal:     General: Abdomen is flat.  Musculoskeletal:     Cervical back: Normal range of motion.  Skin:    General: Skin is warm.      Capillary Refill: Capillary refill takes less than 2 seconds.  Neurological:     General: No focal deficit present.     Mental Status: She is alert.  Psychiatric:        Mood and Affect: Mood normal.   Ortho exam demonstrates mild varus alignment. Pedal pulses palpable. Range of motion is about 5-1 05. No effusion. Collateral and cruciate ligaments are stable. Extensor mechanism intact and nontender. No masses lymphadenopathy or skin changes noted in that knee region. No groin pain with internal or external rotation of the leg.   Vital signs in last 24 hours: Temp:  [98.9 F (37.2 C)] 98.9 F (37.2  C) (07/08 0554) Pulse Rate:  [76] 76 (07/08 0554) Resp:  [17] 17 (07/08 0554) BP: (141)/(68) 141/68 (07/08 0554) SpO2:  [97 %] 97 % (07/08 0554) Weight:  [65.8 kg] 65.8 kg (07/08 0554)  Labs:   Estimated body mass index is 32.51 kg/m as calculated from the following:   Height as of this encounter: 4' 8 (1.422 m).   Weight as of this encounter: 65.8 kg.   Imaging Review Plain radiographs demonstrate severe degenerative joint disease of the left knee(s). The overall alignment ismild varus. The bone quality appears to be good for age and reported activity level.      Assessment/Plan:  End stage arthritis, left knee   The patient history, physical examination, clinical judgment of the provider and imaging studies are consistent with end stage degenerative joint disease of the left knee(s) and total knee arthroplasty is deemed medically necessary. The treatment options including medical management, injection therapy arthroscopy and arthroplasty were discussed at length. The risks and benefits of total knee arthroplasty were presented and reviewed. The risks due to aseptic loosening, infection, stiffness, patella tracking problems, thromboembolic complications and other imponderables were discussed. The patient acknowledged the explanation, agreed to proceed with the plan and consent was  signed. Patient is being admitted for inpatient treatment for surgery, pain control, PT, OT, prophylactic antibiotics, VTE prophylaxis, progressive ambulation and ADL's and discharge planning. The patient is planning to be discharged home with home health services  Plan asa 66 for dvt prophylaxis and likely cemented tka     Patient's anticipated LOS is less than 2 midnights, meeting these requirements: - Younger than 59 - Lives within 1 hour of care - Has a competent adult at home to recover with post-op recover - NO history of  - Chronic pain requiring opiods  - Diabetes  - Coronary Artery Disease  - Heart failure  - Heart attack  - Stroke  - DVT/VTE  - Cardiac arrhythmia  - Respiratory Failure/COPD  - Renal failure  - Anemia  - Advanced Liver disease

## 2023-07-26 NOTE — Anesthesia Postprocedure Evaluation (Signed)
 Anesthesia Post Note  Patient: Printmaker  Procedure(s) Performed: ARTHROPLASTY, KNEE, TOTAL, LEFT (Left: Knee)     Patient location during evaluation: PACU Anesthesia Type: Spinal Level of consciousness: awake and alert Pain management: pain level controlled Vital Signs Assessment: post-procedure vital signs reviewed and stable Respiratory status: spontaneous breathing and respiratory function stable Cardiovascular status: blood pressure returned to baseline and stable Postop Assessment: spinal receding and no apparent nausea or vomiting Anesthetic complications: no   There were no known notable events for this encounter.  Last Vitals:  Vitals:   07/26/23 1245 07/26/23 1312  BP: (!) 116/58 112/63  Pulse: 72 68  Resp: 11 20  Temp:  36.5 C  SpO2: 92% 96%    Last Pain:  Vitals:   07/26/23 1215  TempSrc:   PainSc: Asleep                 Debby FORBES Like

## 2023-07-26 NOTE — Anesthesia Procedure Notes (Addendum)
 Procedure Name: MAC Date/Time: 07/26/2023 7:42 AM  Performed by: Jolynn Mage, CRNAPre-anesthesia Checklist: Patient identified, Emergency Drugs available, Suction available, Timeout performed and Patient being monitored Patient Re-evaluated:Patient Re-evaluated prior to induction Oxygen Delivery Method: Simple face mask

## 2023-07-26 NOTE — Op Note (Signed)
 Patricia Hoover, Patricia Hoover MEDICAL RECORD NO: [id number] ACCOUNT NO: [id number] DATE OF BIRTH: 1945-09-22 FACILITY: [facility] LOCATION: [facility] PHYSICIAN: Cordella RAMAN. Addie, MD  Operative Report   DATE OF PROCEDURE: 07/26/2023  PREOPERATIVE DIAGNOSIS:  Left knee arthritis.  POSTOPERATIVE DIAGNOSIS:  Left knee arthritis.  PROCEDURES PERFORMED:  Left total knee replacement using Persona cemented cruciate-retaining size 4 narrow femur with cemented size C tibia, 29-mm cemented 8-mm all-poly patella with 12-mm medial congruent polyethylene liner.  SURGEON:  Cordella RAMAN. Addie, MD.  ASSISTANT:  April Landy, RNFA.  INDICATIONS:  The patient is a 78 year old with left knee pain.  The patient presents for operative management after failure of conservative management and explanation of risks and benefits.  DESCRIPTION OF PROCEDURE:  The patient was brought to the operating room where spinal anesthetic was induced.  Preoperative IV antibiotics were administered.  Timeout was called.  The left leg was prescrubbed with alcohol and Betadine  and allowed to air  dry and prepped with DuraPrep solution and draped in a sterile manner.  Ioban was used to cover the operative field.  The patient had full extension and about 120 degrees of flexion.  The patient had slight varus alignment.  After elevating the leg and  exsanguinating with the Esmarch wrap, the tourniquet was inflated.  Timeout was called.  Anterior approach to the knee was made.  Skin and subcutaneous tissue were sharply divided.  Irrisept solution was utilized.  A median parapatellar approach was made  and marked with a #1 Vicryl suture.  The patella was then everted.  Severe arthritis was present in the patellofemoral joint as well as the medial compartment.  Lateral patellofemoral ligament was released.  Fat pad partially excised.  Medial soft  tissue dissection was performed proportional to the patient's mild preoperative varus deformity.  ACL released.   Anterior horn lateral meniscus released.  Soft tissue removed from the anterior distal femur.  Patella was everted.  The knee was flexed.   Osteophytes were removed with a rongeur.  Bone quality was good.  The posterior retractor was placed and the collateral ligaments were protected.  Intramedullary alignment was then used to make a cut of 10 mm off the least affected lateral tibial  plateau.  This was perpendicular to the mechanical axis of the tibia.  Next, the femur was then cut in 5 degrees of valgus using intramedullary alignment.  A 10-mm cut was made.  This allowed a 10-mm spacer to fit within the extension gap.  Next, we  sized, we cut the femur in 3 degrees of external rotation.  In order to achieve a more symmetric cut on the small femur, we moved the guide 2 mm posterior in order to achieve a better surface for the anterior flange.  This gave symmetric flexion and  extension gaps of 10-12 mm.  Next, the tibial tray was placed in alignment with the medial third of the tibial tubercle.  Screws were placed.  Next, we placed the trial femur and tibia into the knee.  The patella was then cut from 22 to 13 mm.  An 8-mm  3-peg patella was then placed.  With trial components in position, the patient did have a little bit of lift off beyond 110 degrees of flexion.  The PCL was released and that corrected.  The patient had slight hyperextension with the 10-mm spacer.  We  tried an 11 and 12-mm spacer and that gave very good stability with no lift-off up to 120 degrees of flexion.  Very good patellar tracking using no thumbs technique.  At this time, final preparations were made on both the femur and the tibia.  Trial  components were removed.  Thorough irrigation was performed with 3 liters of pulsatile irrigation.  We then let a TXA sponge sit for 3 minutes along with Irrisept solution into the knee.  Those were removed, and we anesthetized the capsule using  Marcaine , Exparel  and saline.  At this time,  the bone plugs were placed in both the femur and the tibia.  Irrisept solution was applied and removed.  Vancomycin  powder was then placed and the components were cemented into position with a 12-mm spacer.   The 12-mm spacer gave full extension but also very good flexion and good stability to varus and valgus stress at 0, 30, and 90 degrees.  The true spacer was placed after cement hardening occurred.  Tourniquet was released.  Excess cement was removed.   Three liters of pouring irrigation was utilized.  The knee was then closed over a bolster using #1 Vicryl suture.  Prior to final closure, we did irrigate the knee out again with Irrisept solution and placed in some vancomycin  powder. Full complete  closure was performed.  We then injected a solution of Marcaine , morphine , and clonidine  proportional to the patient's weight into the knee for postoperative pain relief.  Next, the incision was closed using interrupted inverted #0 Vicryl suture, 2-0  Vicryl suture, and 3-0 Monocryl with Steri-Strips, Aquacel dressing, Ace wrap, and knee immobilizer placed.  The patient tolerated the procedure well without immediate complications.    MUK D: 07/26/2023 11:09:54 am T: 07/26/2023 11:24:00 am  JOB: [id number]/ [id number]

## 2023-07-26 NOTE — Transfer of Care (Addendum)
 Immediate Anesthesia Transfer of Care Note  Patient: Patricia Hoover  Procedure(s) Performed: ARTHROPLASTY, KNEE, TOTAL, LEFT (Left: Knee)  Patient Location: PACU  Anesthesia Type:MAC/Spinal  Level of Consciousness: awake and oriented  Airway & Oxygen Therapy: Patient Spontanous Breathing  Post-op Assessment: Report given to RN  Post vital signs: Reviewed  Last Vitals:  Vitals Value Taken Time  BP 127/74 07/26/23 10:51  Temp 36.6 C 07/26/23 10:50  Pulse 79 07/26/23 10:55  Resp 31 07/26/23 10:55  SpO2 95 % 07/26/23 10:55  Vitals shown include unfiled device data.  Last Pain:  Vitals:   07/26/23 0620  TempSrc:   PainSc: 0-No pain         Complications: There were no known notable events for this encounter.

## 2023-07-26 NOTE — Telephone Encounter (Signed)
LMOM of the below message from [employer]

## 2023-07-26 NOTE — Progress Notes (Signed)
 Orthopedic Tech Progress Note Patient Details:  Patricia Hoover 10/30/1945 [id number]  CPM Left Knee CPM Left Knee: On Left Knee Flexion (Degrees): 10 Left Knee Extension (Degrees): 40  Post Interventions Patient Tolerated: Well Instructions Provided: Care of device  Delanna LITTIE Pac 07/26/2023, 12:54 PM

## 2023-07-26 NOTE — Brief Op Note (Signed)
   07/26/2023  11:02 AM  PATIENT:  Patricia Hoover  78 y.o. female  PRE-OPERATIVE DIAGNOSIS:  left knee osteoarthritis  POST-OPERATIVE DIAGNOSIS:  left knee osteoarthritis  PROCEDURE:  Procedure(s): ARTHROPLASTY, KNEE, TOTAL, LEFT  SURGEON:  Surgeon(s): Addie, Cordella Hamilton, MD  ASSISTANT: green rnfa  ANESTHESIA:   spinal  EBL: 30 ml    Total I/O In: 1100 [I.V.:650; IV Piggyback:450] Out: 1235 [Urine:1200; Blood:35]  BLOOD ADMINISTERED: none  DRAINS: none   LOCAL MEDICATIONS USED: Marcaine  morphine  clonidine  Exparel   SPECIMEN:  No Specimen  COUNTS:  YES  TOURNIQUET:   Total Tourniquet Time Documented: Thigh (Left) - 102 minutes Total: Thigh (Left) - 102 minutes   DICTATION: .Other Dictation: Dictation Number [id number] PLAN OF CARE: Admit for overnight observation  PATIENT DISPOSITION:  PACU - hemodynamically stable

## 2023-07-26 NOTE — Anesthesia Procedure Notes (Signed)
 Spinal  Patient location during procedure: OR Start time: 07/26/2023 7:45 AM End time: 07/26/2023 7:48 AM Reason for block: surgical anesthesia Staffing Performed: anesthesiologist  Anesthesiologist: Lucious Debby BRAVO, MD Performed by: Lucious Debby BRAVO, MD Authorized by: Lucious Debby BRAVO, MD   Preanesthetic Checklist Completed: patient identified, IV checked, risks and benefits discussed, surgical consent, monitors and equipment checked, pre-op evaluation and timeout performed Spinal Block Patient position: sitting Prep: DuraPrep Patient monitoring: heart rate, cardiac monitor, continuous pulse ox and blood pressure Approach: midline Location: L3-4 Injection technique: single-shot Needle Needle type: Pencan  Needle gauge: 24 G Additional Notes Consent was obtained prior to the procedure with all questions answered and concerns addressed. Risks including, but not limited to, bleeding, infection, nerve damage, paralysis, failed block, inadequate analgesia, allergic reaction, high spinal, itching, and headache were discussed and the patient wished to proceed. Functioning IV was confirmed and monitors were applied. Sterile prep and drape, including hand hygiene, mask, and sterile gloves were used. The patient was positioned and the spine was prepped. The skin was anesthetized with lidocaine . Free flow of clear CSF was obtained prior to injecting local anesthetic into the CSF. The spinal needle aspirated freely following injection. The needle was carefully withdrawn. The patient tolerated the procedure well.   Debby Lucious, MD

## 2023-07-27 ENCOUNTER — Encounter (HOSPITAL_COMMUNITY): Payer: Self-pay | Admitting: Orthopedic Surgery

## 2023-07-27 DIAGNOSIS — M1712 Unilateral primary osteoarthritis, left knee: Secondary | ICD-10-CM | POA: Diagnosis not present

## 2023-07-27 DIAGNOSIS — Z87891 Personal history of nicotine dependence: Secondary | ICD-10-CM | POA: Diagnosis not present

## 2023-07-27 DIAGNOSIS — Z7982 Long term (current) use of aspirin: Secondary | ICD-10-CM | POA: Diagnosis not present

## 2023-07-27 DIAGNOSIS — I10 Essential (primary) hypertension: Secondary | ICD-10-CM | POA: Diagnosis not present

## 2023-07-27 LAB — BASIC METABOLIC PANEL WITH GFR
Anion gap: 12 (ref 5–15)
BUN: 13 mg/dL (ref 8–23)
CO2: 28 mmol/L (ref 22–32)
Calcium: 9.2 mg/dL (ref 8.9–10.3)
Chloride: 97 mmol/L — ABNORMAL LOW (ref 98–111)
Creatinine, Ser: 0.65 mg/dL (ref 0.44–1.00)
GFR, Estimated: >60 mL/min (ref >60)
Glucose, Bld: 183 mg/dL — ABNORMAL HIGH (ref 70–99)
Potassium: 3 mmol/L — ABNORMAL LOW (ref 3.5–5.1)
Sodium: 137 mmol/L (ref 135–145)

## 2023-07-27 MED ORDER — ONDANSETRON HCL 4 MG PO TABS: 4.0000 mg | ORAL_TABLET | Freq: Four times a day (QID) | ORAL | 0 refills | Status: DC | PRN

## 2023-07-27 MED ORDER — ACETAMINOPHEN 500 MG PO TABS
500.0000 mg | ORAL_TABLET | Freq: Four times a day (QID) | ORAL | Status: DC
  Administered 2023-07-27 – 2023-07-28 (×3): 1000 mg via ORAL
  Filled 2023-07-27 (×3): qty 2

## 2023-07-27 MED ORDER — TRAMADOL HCL 50 MG PO TABS: 50.0000 mg | ORAL_TABLET | Freq: Four times a day (QID) | ORAL | 0 refills | Status: DC | PRN

## 2023-07-27 MED ORDER — TRAMADOL HCL 50 MG PO TABS
50.0000 mg | ORAL_TABLET | Freq: Four times a day (QID) | ORAL | Status: DC | PRN
  Administered 2023-07-27 – 2023-07-28 (×2): 50 mg via ORAL
  Filled 2023-07-27 (×2): qty 1

## 2023-07-27 MED ORDER — ASPIRIN 81 MG PO CHEW: 81.0000 mg | CHEWABLE_TABLET | Freq: Every day | ORAL | 0 refills | Status: DC

## 2023-07-27 MED ORDER — SENNA 8.6 MG PO TABS
1.0000 | ORAL_TABLET | Freq: Two times a day (BID) | ORAL | Status: DC | PRN
  Administered 2023-07-27: 17.2 mg via ORAL
  Filled 2023-07-27: qty 2

## 2023-07-27 NOTE — Progress Notes (Signed)
 Physical Therapy Treatment Patient Details Name: Patricia Hoover MRN: [id number] DOB: [birth year] Today's Date: 07/27/2023   History of Present Illness 78 yo female s/p L TKR 07/25/23. PMH includes lower GIB, atherosclerosis, diverticulosis, HTN, HLD.    PT Comments  Pt c/o nausea limiting ambulation tolerance. Focused on L quad strengthening and step through gait pattern. PT to return later today for second session prior to potential d/c today.     If plan is discharge home, recommend the following: A little help with walking and/or transfers;A little help with bathing/dressing/bathroom   Can travel by private vehicle        Equipment Recommendations  None recommended by PT    Recommendations for Other Services       Precautions / Restrictions Precautions Precautions: Fall;Knee Required Braces or Orthoses: Knee Immobilizer - Left Knee Immobilizer - Left: On when out of bed or walking;Discontinue once straight leg raise with < 10 degree lag Restrictions Weight Bearing Restrictions Per Provider Order: Yes LLE Weight Bearing Per Provider Order: Weight bearing as tolerated     Mobility  Bed Mobility Overal bed mobility: Needs Assistance Bed Mobility: Supine to Sit, Sit to Supine     Supine to sit: Min assist, HOB elevated Sit to supine: Contact guard assist, HOB elevated, Used rails   General bed mobility comments: light assist for LE progression OOB, increased time, assist to scoot hips over in bed, with self assist pt able to bring LEs back into bed, increased time    Transfers Overall transfer level: Needs assistance Equipment used: Rolling walker (2 wheels) Transfers: Sit to/from Stand Sit to Stand: Contact guard assist           General transfer comment: for safety, cues for correct hand placement. stand x2, from EOB and toilet    Ambulation/Gait Ambulation/Gait assistance: Contact guard assist Gait Distance (Feet): 140 Feet Assistive device: Rolling walker (2  wheels) Gait Pattern/deviations: Step-to pattern, Decreased step length - left, Trunk flexed, Step-through pattern Gait velocity: decr Gait velocity interpretation: <1.31 ft/sec, indicative of household ambulator   General Gait Details: cues for upright posture, placement in RW, sequencing, focused on L quad set during stance phase vs using bilat UEs to offweight L LE, progressed from step to pattern to step through with decreased step length and assist to continue forward momentum of RW   Stairs             Wheelchair Mobility     Tilt Bed    Modified Rankin (Stroke Patients Only)       Balance Overall balance assessment: Needs assistance Sitting-balance support: No upper extremity supported, Feet supported Sitting balance-Leahy Scale: Good     Standing balance support: Bilateral upper extremity supported, During functional activity, Reliant on assistive device for balance Standing balance-Leahy Scale: Fair                              Hotel manager: No apparent difficulties  Cognition Arousal: Alert Behavior During Therapy: WFL for tasks assessed/performed   PT - Cognitive impairments: No apparent impairments                         Following commands: Intact      Cueing Cueing Techniques: Verbal cues, Gestural cues  Exercises Total Joint Exercises Quad Sets: AROM, Left, Supine, 10 reps Short Arc Quad: AROM, Left, 10 reps, Supine (AA initially but pt able  to hold at end range 5 sec) Heel Slides: AAROM, Left, 5 reps, Supine    General Comments General comments (skin integrity, edema, etc.): L knee incision with dressing      Pertinent Vitals/Pain Pain Assessment Pain Assessment: 0-10 Pain Score: 6  Pain Intervention(s): Monitored during session    Home Living                          Prior Function            PT Goals (current goals can now be found in the care plan section) Acute  Rehab PT Goals Patient Stated Goal: home PT Goal Formulation: With patient Time For Goal Achievement: 08/02/23 Potential to Achieve Goals: Good Progress towards PT goals: Progressing toward goals    Frequency    7X/week      PT Plan      Co-evaluation              AM-PAC PT 6 Clicks Mobility   Outcome Measure  Help needed turning from your back to your side while in a flat bed without using bedrails?: A Little Help needed moving from lying on your back to sitting on the side of a flat bed without using bedrails?: A Little Help needed moving to and from a bed to a chair (including a wheelchair)?: A Little Help needed standing up from a chair using your arms (e.g., wheelchair or bedside chair)?: A Little Help needed to walk in hospital room?: A Little Help needed climbing 3-5 steps with a railing? : A Little 6 Click Score: 18    End of Session Equipment Utilized During Treatment: Gait belt Activity Tolerance: Patient limited by fatigue Patient left: with call bell/phone within reach;in bed;with family/visitor present Nurse Communication: Mobility status PT Visit Diagnosis: Other abnormalities of gait and mobility (R26.89);Muscle weakness (generalized) (M62.81)     Time: 9082-9049 PT Time Calculation (min) (ACUTE ONLY): 33 min  Charges:    $Gait Training: 8-22 mins $Therapeutic Exercise: 8-22 mins PT General Charges $$ ACUTE PT VISIT: 1 Visit                     Norene Ames, PT, DPT Acute Rehabilitation Services Secure chat preferred Office #: [phone]    Norene CHRISTELLA Ames 07/27/2023, 10:06 AM

## 2023-07-27 NOTE — Care Management Obs Status (Signed)
 MEDICARE OBSERVATION STATUS NOTIFICATION   Patient Details  Name: Delita Chiquito MRN: [id number] Date of Birth: [birth year]   Medicare Observation Status Notification Given:  Yes    Andrez JULIANNA George, RN 07/27/2023, 2:46 PM

## 2023-07-27 NOTE — Progress Notes (Signed)
 Physical Therapy Treatment Patient Details Name: Patricia Hoover MRN: [id number] DOB: 02-Feb-1945 Today's Date: 07/27/2023   History of Present Illness 78 yo female s/p L TKR 07/25/23. PMH includes lower GIB, atherosclerosis, diverticulosis, HTN, HLD.    PT Comments  Pt c/o abdominal discomfort and nausea limiting mobility. From L TKA standpoint pt mobilizing well and able to tolerate increased amb distance without L knee buckling.  Pt and dtr with good return demonstration of safe stair negotiation needed to enter home. Acute PT to cont to follow.   If plan is discharge home, recommend the following: A little help with walking and/or transfers;A little help with bathing/dressing/bathroom   Can travel by private vehicle        Equipment Recommendations  None recommended by PT    Recommendations for Other Services       Precautions / Restrictions Precautions Precautions: Fall;Knee Restrictions Weight Bearing Restrictions Per Provider Order: Yes LLE Weight Bearing Per Provider Order: Weight bearing as tolerated     Mobility  Bed Mobility Overal bed mobility: Needs Assistance Bed Mobility: Supine to Sit, Sit to Supine     Supine to sit: Contact guard Sit to supine: Contact guard assist   General bed mobility comments: HOB elevated, increased time; no physical assist required    Transfers Overall transfer level: Needs assistance Equipment used: Rolling walker (2 wheels) Transfers: Sit to/from Stand Sit to Stand: Contact guard assist           General transfer comment: cueing for hand placement    Ambulation/Gait Ambulation/Gait assistance: Contact guard assist Gait Distance (Feet): 200 Feet Assistive device: Rolling walker (2 wheels) Gait Pattern/deviations: Step-to pattern, Decreased step length - left, Trunk flexed, Step-through pattern Gait velocity: decr Gait velocity interpretation: <1.31 ft/sec, indicative of household ambulator   General Gait Details: freq  stops due to abdominal pain, cues for upright posture, placement in RW, sequencing, focused on L quad set during stance phase vs using bilat UEs to offweight L LE, progressed from step to pattern to step through with decreased step length and assist to continue forward momentum of RW   Stairs Stairs: Yes   Stair Management: Two rails, Step to pattern, Forwards Number of Stairs: 1 General stair comments: 1 step to mimic home set up,  discussed with dtr and pt up with the good (R LE), down with the bad ( L LE) both with good return demonstration and understanding. discussed placing RW up on platform step instead of using railing as home doesn't have railings to enter home   Wheelchair Mobility     Tilt Bed    Modified Rankin (Stroke Patients Only)       Balance Overall balance assessment: Needs assistance Sitting-balance support: No upper extremity supported, Feet supported Sitting balance-Leahy Scale: Good     Standing balance support: Bilateral upper extremity supported, Single extremity supported, During functional activity Standing balance-Leahy Scale: Fair                              Hotel manager: No apparent difficulties  Cognition Arousal: Alert Behavior During Therapy: WFL for tasks assessed/performed   PT - Cognitive impairments: No apparent impairments                         Following commands: Intact      Cueing Cueing Techniques: Verbal cues  Exercises Total Joint Exercises Quad Sets: AROM, Left,  Supine, 10 reps Short Arc Quad: AROM, Left, 10 reps, Supine (AA initially but pt able to hold at end range 5 sec) Heel Slides: AAROM, Left, 5 reps, Supine    General Comments General comments (skin integrity, edema, etc.): assisted to bathrom, pt supervision with hygiene, pt leaning on sink to wash hands      Pertinent Vitals/Pain Pain Assessment Pain Assessment: Faces Pain Score: 6  Faces Pain Scale:  Hurts even more Pain Location: more abdomen this 2nd session vs L knee Pain Descriptors / Indicators: Discomfort Pain Intervention(s): Monitored during session    Home Living Family/patient expects to be discharged to:: Private residence Living Arrangements: Children (daughter) Available Help at Discharge: Family Type of Home: House Home Access: Stairs to enter   Secretary/administrator of Steps: 1 (threshold) Alternate Level Stairs-Number of Steps: flight Home Layout: Two level;1/2 bath on main level Home Equipment: Cane - single Librarian, academic (2 wheels);Grab bars - tub/shower Additional Comments: CPM    Prior Function            PT Goals (current goals can now be found in the care plan section) Acute Rehab PT Goals Patient Stated Goal: home PT Goal Formulation: With patient Time For Goal Achievement: 08/02/23 Potential to Achieve Goals: Good Progress towards PT goals: Progressing toward goals    Frequency    7X/week      PT Plan      Co-evaluation              AM-PAC PT 6 Clicks Mobility   Outcome Measure  Help needed turning from your back to your side while in a flat bed without using bedrails?: A Little Help needed moving from lying on your back to sitting on the side of a flat bed without using bedrails?: A Little Help needed moving to and from a bed to a chair (including a wheelchair)?: A Little Help needed standing up from a chair using your arms (e.g., wheelchair or bedside chair)?: A Little Help needed to walk in hospital room?: A Little Help needed climbing 3-5 steps with a railing? : A Little 6 Click Score: 18    End of Session Equipment Utilized During Treatment: Gait belt Activity Tolerance: Patient limited by fatigue Patient left: with call bell/phone within reach;in bed;with family/visitor present Nurse Communication: Mobility status PT Visit Diagnosis: Other abnormalities of gait and mobility (R26.89);Muscle weakness  (generalized) (M62.81)     Time: 8842-8768 PT Time Calculation (min) (ACUTE ONLY): 34 min  Charges:    $Gait Training: 23-37 mins PT General Charges $$ ACUTE PT VISIT: 1 Visit                     Norene Ames, PT, DPT Acute Rehabilitation Services Secure chat preferred Office #: [phone]    Norene CHRISTELLA Ames 07/27/2023, 1:24 PM

## 2023-07-27 NOTE — Progress Notes (Signed)
  Subjective: Patient stable.  Pain better controlled yesterday than today.   Objective: Vital signs in last 24 hours: Temp:  [97.7 F (36.5 C)-99.5 F (37.5 C)] 99.5 F (37.5 C) (07/09 0621) Pulse Rate:  [58-83] 70 (07/09 0621) Resp:  [9-20] 18 (07/09 0621) BP: (106-136)/(53-74) 136/53 (07/09 0621) SpO2:  [91 %-99 %] 93 % (07/09 0621)  Intake/Output from previous day: 07/08 0701 - 07/09 0700 In: 2300 [P.O.:1200; I.V.:650; IV Piggyback:450] Out: 1235 [Urine:1200; Blood:35] Intake/Output this shift: No intake/output data recorded.  Exam:  Sensation intact distally Intact pulses distally Dorsiflexion/Plantar flexion intact  Labs: No results for input(s): HGB in the last 72 hours. No results for input(s): WBC, RBC, HCT, PLT in the last 72 hours. No results for input(s): NA, K, CL, CO2, BUN, CREATININE, GLUCOSE, CALCIUM  in the last 72 hours. No results for input(s): LABPT, INR in the last 72 hours.  Assessment/Plan: Plan at this time is to see how patient does with physical therapy.  She is able to do a straight leg raise today.  I spent time in the CPM machine.  Dressing removed.  If she is feeling good then I think we may be able to get her out by this afternoon after physical therapy this morning and early this afternoon.  If not we will see how she does in the a.m. for discharge tomorrow.   G Scott Annalynne Ibanez 07/27/2023, 7:17 AM

## 2023-07-27 NOTE — Evaluation (Signed)
 Occupational Therapy Evaluation Patient Details Name: Teneshia Hedeen MRN: [id number] DOB: 03-16-1945 Today's Date: 07/27/2023   History of Present Illness   78 yo female s/p L TKR 07/25/23. PMH includes lower GIB, atherosclerosis, diverticulosis, HTN, HLD.     Clinical Impressions PTA patient independent.  Admitted for above and presents with problem list below. Today needs min guard for bed mobility, contact guard for transfers with cueing for hand placement using RW, and up to contact guard for ADLs.  Educated on compensatory techniques for LB dressing and bathing, hand placement during transfers.  Daughter looking into purchasing a shower chair.  Pt has good support at home.  No further questions or concerns.  OT will sign off.      If plan is discharge home, recommend the following:   A little help with walking and/or transfers;A little help with bathing/dressing/bathroom;Assistance with cooking/housework;Assist for transportation     Functional Status Assessment         Equipment Recommendations   Tub/shower seat (daughter purchasing)     Recommendations for Other Services         Precautions/Restrictions   Precautions Precautions: Fall;Knee Required Braces or Orthoses: Knee Immobilizer - Left Knee Immobilizer - Left: On when out of bed or walking;Discontinue once straight leg raise with < 10 degree lag Restrictions Weight Bearing Restrictions Per Provider Order: Yes LLE Weight Bearing Per Provider Order: Weight bearing as tolerated     Mobility Bed Mobility Overal bed mobility: Needs Assistance Bed Mobility: Supine to Sit, Sit to Supine     Supine to sit: Contact guard Sit to supine: Contact guard assist   General bed mobility comments: HOB elevated, increased time; no physical assist required    Transfers Overall transfer level: Needs assistance Equipment used: Rolling walker (2 wheels) Transfers: Sit to/from Stand Sit to Stand: Contact guard  assist           General transfer comment: cueing for hand placement      Balance Overall balance assessment: Needs assistance Sitting-balance support: No upper extremity supported, Feet supported Sitting balance-Leahy Scale: Good     Standing balance support: Bilateral upper extremity supported, Single extremity supported, During functional activity Standing balance-Leahy Scale: Fair                             ADL either performed or assessed with clinical judgement   ADL Overall ADL's : Needs assistance/impaired     Grooming: Set up;Sitting   Upper Body Bathing: Set up;Sitting   Lower Body Bathing: Contact guard assist;Sitting/lateral leans;Sit to/from stand Lower Body Bathing Details (indicate cue type and reason): reviewed recommendations for shower chair once cleared by MD t o shower Upper Body Dressing : Set up;Sitting   Lower Body Dressing: Contact guard assist;Sit to/from stand Lower Body Dressing Details (indicate cue type and reason): simulated, pt able to reach feet.  educated on compensatory techniques and safety with dressing sitting Toilet Transfer: Contact guard assist;Rolling walker (2 wheels)       Tub/ Shower Transfer: Walk-in shower;Contact guard assist;Rolling walker (2 wheels) Tub/Shower Transfer Details (indicate cue type and reason): simulated, educate on using RW to support self when stepping over threshold. Functional mobility during ADLs: Contact guard assist       Vision   Vision Assessment?: No apparent visual deficits     Perception         Praxis         Pertinent Vitals/Pain  Pain Assessment Pain Assessment: Faces Faces Pain Scale: Hurts little more Pain Location: L knee Pain Descriptors / Indicators: Discomfort, Operative site guarding Pain Intervention(s): Limited activity within patient's tolerance, Monitored during session, Repositioned     Extremity/Trunk Assessment Upper Extremity Assessment Upper  Extremity Assessment: Overall WFL for tasks assessed   Lower Extremity Assessment Lower Extremity Assessment: Defer to PT evaluation   Cervical / Trunk Assessment Cervical / Trunk Assessment: Normal   Communication Communication Communication: No apparent difficulties   Cognition Arousal: Alert Behavior During Therapy: WFL for tasks assessed/performed Cognition: No apparent impairments                               Following commands: Intact       Cueing  General Comments   Cueing Techniques: Verbal cues  L knee incision with dressing   Exercises     Shoulder Instructions      Home Living Family/patient expects to be discharged to:: Private residence Living Arrangements: Children (daughter) Available Help at Discharge: Family Type of Home: House Home Access: Stairs to enter Secretary/administrator of Steps: 1 (threshold)   Home Layout: Two level;1/2 bath on main level Alternate Level Stairs-Number of Steps: flight   Bathroom Shower/Tub: Walk-in shower;Door   [employer] Toilet: Standard     Home Equipment: Cane - single Librarian, academic (2 wheels);Grab bars - tub/shower   Additional Comments: CPM      Prior Functioning/Environment Prior Level of Function : Independent/Modified Independent             Mobility Comments: pt using cane PRN given stiffness and pain. can stay on first floor of home and sponge bathe if needed, states they have turned the dining room into her room for now ADLs Comments: independent ADls    OT Problem List: Decreased strength;Decreased activity tolerance;Impaired balance (sitting and/or standing);Pain;Decreased knowledge of use of DME or AE;Decreased knowledge of precautions   OT Treatment/Interventions:        OT Goals(Current goals can be found in the care plan section)   Acute Rehab OT Goals Patient Stated Goal: get back to line dancing OT Goal Formulation: With patient   OT Frequency:        Co-evaluation              AM-PAC OT 6 Clicks Daily Activity     Outcome Measure Help from another person eating meals?: None Help from another person taking care of personal grooming?: A Little Help from another person toileting, which includes using toliet, bedpan, or urinal?: A Little Help from another person bathing (including washing, rinsing, drying)?: A Little Help from another person to put on and taking off regular upper body clothing?: A Little Help from another person to put on and taking off regular lower body clothing?: A Little 6 Click Score: 19   End of Session Equipment Utilized During Treatment: Rolling walker (2 wheels) Nurse Communication: Mobility status  Activity Tolerance: Patient tolerated treatment well;Patient limited by fatigue Patient left: in bed;with call bell/phone within reach;with family/visitor present  OT Visit Diagnosis: Other abnormalities of gait and mobility (R26.89);Pain Pain - Right/Left: Left Pain - part of body: Knee                Time: 8953-8895 OT Time Calculation (min): 18 min Charges:  OT General Charges $OT Visit: 1 Visit OT Evaluation $OT Eval Low Complexity: 1 Low  Etta NOVAK, OT Acute Rehabilitation Services  Office [phone] Secure Chat Preferred    Etta GORMAN Hope 07/27/2023, 11:21 AM

## 2023-07-27 NOTE — Plan of Care (Signed)
  Problem: Education: Goal: Knowledge of General Education information will improve Description: Including pain rating scale, medication(s)/side effects and non-pharmacologic comfort measures Outcome: Completed/Met   Problem: Health Behavior/Discharge Planning: Goal: Ability to manage health-related needs will improve Outcome: Completed/Met   Problem: Clinical Measurements: Goal: Ability to maintain clinical measurements within normal limits will improve Outcome: Completed/Met Goal: Will remain free from infection Outcome: Completed/Met Goal: Diagnostic test results will improve Outcome: Completed/Met Goal: Respiratory complications will improve Outcome: Completed/Met Goal: Cardiovascular complication will be avoided Outcome: Completed/Met   Problem: Activity: Goal: Risk for activity intolerance will decrease Outcome: Completed/Met   Problem: Nutrition: Goal: Adequate nutrition will be maintained Outcome: Completed/Met   Problem: Coping: Goal: Level of anxiety will decrease Outcome: Completed/Met   Problem: Elimination: Goal: Will not experience complications related to bowel motility Outcome: Completed/Met Goal: Will not experience complications related to urinary retention Outcome: Completed/Met   Problem: Pain Managment: Goal: General experience of comfort will improve and/or be controlled Outcome: Completed/Met   Problem: Safety: Goal: Ability to remain free from injury will improve Outcome: Completed/Met   Problem: Skin Integrity: Goal: Risk for impaired skin integrity will decrease Outcome: Completed/Met   Problem: Education: Goal: Knowledge of the prescribed therapeutic regimen will improve Outcome: Completed/Met Goal: Individualized Educational Video(s) Outcome: Completed/Met   Problem: Activity: Goal: Ability to avoid complications of mobility impairment will improve Outcome: Completed/Met Goal: Range of joint motion will improve Outcome:  Completed/Met   Problem: Clinical Measurements: Goal: Postoperative complications will be avoided or minimized Outcome: Completed/Met   Problem: Pain Management: Goal: Pain level will decrease with appropriate interventions Outcome: Completed/Met   Problem: Skin Integrity: Goal: Will show signs of wound healing Outcome: Completed/Met

## 2023-07-28 DIAGNOSIS — Z471 Aftercare following joint replacement surgery: Secondary | ICD-10-CM | POA: Diagnosis not present

## 2023-07-28 DIAGNOSIS — D649 Anemia, unspecified: Secondary | ICD-10-CM | POA: Diagnosis not present

## 2023-07-28 DIAGNOSIS — Z96652 Presence of left artificial knee joint: Secondary | ICD-10-CM | POA: Diagnosis not present

## 2023-07-28 DIAGNOSIS — M810 Age-related osteoporosis without current pathological fracture: Secondary | ICD-10-CM | POA: Diagnosis not present

## 2023-07-28 DIAGNOSIS — M1712 Unilateral primary osteoarthritis, left knee: Secondary | ICD-10-CM | POA: Diagnosis not present

## 2023-07-28 DIAGNOSIS — Z87891 Personal history of nicotine dependence: Secondary | ICD-10-CM | POA: Diagnosis not present

## 2023-07-28 DIAGNOSIS — I700 Atherosclerosis of aorta: Secondary | ICD-10-CM | POA: Diagnosis not present

## 2023-07-28 DIAGNOSIS — E7800 Pure hypercholesterolemia, unspecified: Secondary | ICD-10-CM | POA: Diagnosis not present

## 2023-07-28 DIAGNOSIS — Z556 Problems related to health literacy: Secondary | ICD-10-CM | POA: Diagnosis not present

## 2023-07-28 DIAGNOSIS — I10 Essential (primary) hypertension: Secondary | ICD-10-CM | POA: Diagnosis not present

## 2023-07-28 DIAGNOSIS — Z7982 Long term (current) use of aspirin: Secondary | ICD-10-CM | POA: Diagnosis not present

## 2023-07-28 NOTE — Progress Notes (Signed)
 Physical Therapy Treatment Patient Details Name: Patricia Hoover MRN: [id number] DOB: Jul 03, 1945 Today's Date: 07/28/2023   History of Present Illness 78 yo female s/p L TKR 07/25/23. PMH includes lower GIB, atherosclerosis, diverticulosis, HTN, HLD.    PT Comments  Pt seated up EOB on arrival, pleasant and eager to mobilize. Pt demonstrating continued progress towards acute goals completing transfers and gait with RW for support with grossly supervision for safety, with light cues for improved gait mechanics and symmetrical stride. Pt issued HEP and all exercises reviewed with pt verbalizing and/or demonstrating understanding of all. Pt was educated on continued walker use to maximize functional independence, safety, and decrease risk for falls as well as safe car entry/exit, ice, HEP and compliance, all precautions and importance of continued mobility. Anticipate safe discharge, with assist level outlined below, once medically cleared, will continue to follow acutely.     If plan is discharge home, recommend the following: A little help with walking and/or transfers;A little help with bathing/dressing/bathroom   Can travel by private vehicle        Equipment Recommendations  None recommended by PT    Recommendations for Other Services       Precautions / Restrictions Precautions Precautions: Fall;Knee Required Braces or Orthoses: Knee Immobilizer - Left Knee Immobilizer - Left: On when out of bed or walking;Discontinue once straight leg raise with < 10 degree lag Restrictions Weight Bearing Restrictions Per Provider Order: Yes LLE Weight Bearing Per Provider Order: Weight bearing as tolerated     Mobility  Bed Mobility Overal bed mobility: Needs Assistance             General bed mobility comments: seated up EOB on arrival    Transfers Overall transfer level: Needs assistance Equipment used: Rolling walker (2 wheels) Transfers: Sit to/from Stand Sit to Stand: Supervision            General transfer comment: cueing for hand placement    Ambulation/Gait Ambulation/Gait assistance: Supervision Gait Distance (Feet): 225 Feet Assistive device: Rolling walker (2 wheels) Gait Pattern/deviations: Decreased step length - left, Trunk flexed, Step-through pattern Gait velocity: decr     General Gait Details: cues for upright posture, placement in RW, sequencing focuesed on increased heel strike on L   Stairs             Wheelchair Mobility     Tilt Bed    Modified Rankin (Stroke Patients Only)       Balance Overall balance assessment: Needs assistance Sitting-balance support: No upper extremity supported, Feet supported Sitting balance-Leahy Scale: Good     Standing balance support: Bilateral upper extremity supported, Single extremity supported, During functional activity Standing balance-Leahy Scale: Fair                              Hotel manager: No apparent difficulties  Cognition Arousal: Alert Behavior During Therapy: WFL for tasks assessed/performed   PT - Cognitive impairments: No apparent impairments                         Following commands: Intact      Cueing Cueing Techniques: Verbal cues  Exercises      General Comments General comments (skin integrity, edema, etc.): pr daughter present and supportive      Pertinent Vitals/Pain Pain Assessment Pain Assessment: Faces Faces Pain Scale: Hurts a little bit Pain Location: L knee Pain Descriptors /  Indicators: Discomfort Pain Intervention(s): Monitored during session, Limited activity within patient's tolerance    Home Living                          Prior Function            PT Goals (current goals can now be found in the care plan section) Acute Rehab PT Goals Patient Stated Goal: home PT Goal Formulation: With patient Time For Goal Achievement: 08/02/23 Progress towards PT goals:  Progressing toward goals    Frequency    7X/week      PT Plan      Co-evaluation              AM-PAC PT 6 Clicks Mobility   Outcome Measure  Help needed turning from your back to your side while in a flat bed without using bedrails?: A Little Help needed moving from lying on your back to sitting on the side of a flat bed without using bedrails?: A Little Help needed moving to and from a bed to a chair (including a wheelchair)?: A Little Help needed standing up from a chair using your arms (e.g., wheelchair or bedside chair)?: A Little Help needed to walk in hospital room?: A Little Help needed climbing 3-5 steps with a railing? : A Little 6 Click Score: 18    End of Session Equipment Utilized During Treatment: Gait belt Activity Tolerance: Patient tolerated treatment well Patient left: with call bell/phone within reach;in bed;with family/visitor present (seated EOB) Nurse Communication: Mobility status PT Visit Diagnosis: Other abnormalities of gait and mobility (R26.89);Muscle weakness (generalized) (M62.81)     Time: 9084-9066 PT Time Calculation (min) (ACUTE ONLY): 18 min  Charges:    $Gait Training: 8-22 mins PT General Charges $$ ACUTE PT VISIT: 1 Visit                     Maor Meckel R. PTA Acute Rehabilitation Services Office: [phone]   Therisa CHRISTELLA Boor 07/28/2023, 9:42 AM

## 2023-07-28 NOTE — Progress Notes (Signed)
 Pt stable  No n/v Did well with PT Dc home today

## 2023-07-28 NOTE — Progress Notes (Signed)
 Patient alert and oriented, void, ambulate. Surgical site clean and dry no sign of infection. D/c instructions explain and to the patient and family. All questions answered.

## 2023-08-01 DIAGNOSIS — D649 Anemia, unspecified: Secondary | ICD-10-CM | POA: Diagnosis not present

## 2023-08-01 DIAGNOSIS — I700 Atherosclerosis of aorta: Secondary | ICD-10-CM | POA: Diagnosis not present

## 2023-08-01 DIAGNOSIS — I10 Essential (primary) hypertension: Secondary | ICD-10-CM | POA: Diagnosis not present

## 2023-08-01 DIAGNOSIS — Z96652 Presence of left artificial knee joint: Secondary | ICD-10-CM | POA: Diagnosis not present

## 2023-08-01 DIAGNOSIS — M810 Age-related osteoporosis without current pathological fracture: Secondary | ICD-10-CM | POA: Diagnosis not present

## 2023-08-01 DIAGNOSIS — Z471 Aftercare following joint replacement surgery: Secondary | ICD-10-CM | POA: Diagnosis not present

## 2023-08-02 ENCOUNTER — Telehealth: Payer: Self-pay | Admitting: Radiology

## 2023-08-02 ENCOUNTER — Other Ambulatory Visit: Payer: Self-pay | Admitting: Orthopedic Surgery

## 2023-08-02 MED ORDER — METHOCARBAMOL 500 MG PO TABS: 500.0000 mg | ORAL_TABLET | Freq: Three times a day (TID) | ORAL | 0 refills | Status: DC | PRN

## 2023-08-02 NOTE — Telephone Encounter (Signed)
 Patient called triage this morning.  States she's having increased pain and trouble walking this morning.  No fall or injury. Patient did have PT yesterday. States this is a new pain.   LT TKA 07/26/23 with Dr. Addie

## 2023-08-02 NOTE — Telephone Encounter (Signed)
 I actually talk with her about pain medicine and muscle relaxer and she really did not want to do either of those because they said they make her feel loopy

## 2023-08-02 NOTE — Telephone Encounter (Signed)
 I spoke with daughter, who is there with her. She states therapy really worked with her yesterday and she did 16 steps, so I think this is why she is in a little more pain this morning. We talked about ice/elevation and taking her pain medication a little more consistently if her pain has increased over night. What do you think about a muscle relaxer as well? Just curious if you felt she may need one or it may be helpful. Thank you.

## 2023-08-02 NOTE — Telephone Encounter (Signed)
 Meds sent

## 2023-08-02 NOTE — Telephone Encounter (Signed)
 Let me know if she wants some tho and I can send in thx

## 2023-08-03 DIAGNOSIS — I10 Essential (primary) hypertension: Secondary | ICD-10-CM | POA: Diagnosis not present

## 2023-08-03 DIAGNOSIS — M810 Age-related osteoporosis without current pathological fracture: Secondary | ICD-10-CM | POA: Diagnosis not present

## 2023-08-03 DIAGNOSIS — Z471 Aftercare following joint replacement surgery: Secondary | ICD-10-CM | POA: Diagnosis not present

## 2023-08-03 DIAGNOSIS — I700 Atherosclerosis of aorta: Secondary | ICD-10-CM | POA: Diagnosis not present

## 2023-08-03 DIAGNOSIS — Z96652 Presence of left artificial knee joint: Secondary | ICD-10-CM | POA: Diagnosis not present

## 2023-08-03 DIAGNOSIS — D649 Anemia, unspecified: Secondary | ICD-10-CM | POA: Diagnosis not present

## 2023-08-03 NOTE — Telephone Encounter (Signed)
Patient aware medication sent

## 2023-08-05 DIAGNOSIS — I700 Atherosclerosis of aorta: Secondary | ICD-10-CM | POA: Diagnosis not present

## 2023-08-05 DIAGNOSIS — I10 Essential (primary) hypertension: Secondary | ICD-10-CM | POA: Diagnosis not present

## 2023-08-05 DIAGNOSIS — M810 Age-related osteoporosis without current pathological fracture: Secondary | ICD-10-CM | POA: Diagnosis not present

## 2023-08-05 DIAGNOSIS — Z471 Aftercare following joint replacement surgery: Secondary | ICD-10-CM | POA: Diagnosis not present

## 2023-08-05 DIAGNOSIS — Z96652 Presence of left artificial knee joint: Secondary | ICD-10-CM | POA: Diagnosis not present

## 2023-08-05 DIAGNOSIS — D649 Anemia, unspecified: Secondary | ICD-10-CM | POA: Diagnosis not present

## 2023-08-08 ENCOUNTER — Telehealth: Payer: Self-pay | Admitting: Gastroenterology

## 2023-08-08 DIAGNOSIS — M810 Age-related osteoporosis without current pathological fracture: Secondary | ICD-10-CM | POA: Diagnosis not present

## 2023-08-08 DIAGNOSIS — I700 Atherosclerosis of aorta: Secondary | ICD-10-CM | POA: Diagnosis not present

## 2023-08-08 DIAGNOSIS — I10 Essential (primary) hypertension: Secondary | ICD-10-CM | POA: Diagnosis not present

## 2023-08-08 DIAGNOSIS — Z96652 Presence of left artificial knee joint: Secondary | ICD-10-CM | POA: Diagnosis not present

## 2023-08-08 DIAGNOSIS — D649 Anemia, unspecified: Secondary | ICD-10-CM | POA: Diagnosis not present

## 2023-08-08 DIAGNOSIS — Z471 Aftercare following joint replacement surgery: Secondary | ICD-10-CM | POA: Diagnosis not present

## 2023-08-08 NOTE — Telephone Encounter (Signed)
 Patient had called in regarding to seeing what she do in order to get more of the Iron-FA-B-Cmp-Biot- Probiotic( Fusion Plus). I Advise patient I would speak to nurse and give her a call back. I called patient back with recommendation that the patient needs to follow up with her PCP or wait until she is seen with Jessica Zehr.

## 2023-08-10 ENCOUNTER — Ambulatory Visit: Payer: Medicare Other

## 2023-08-10 ENCOUNTER — Encounter: Admitting: Surgical

## 2023-08-11 DIAGNOSIS — I10 Essential (primary) hypertension: Secondary | ICD-10-CM | POA: Diagnosis not present

## 2023-08-11 DIAGNOSIS — Z96652 Presence of left artificial knee joint: Secondary | ICD-10-CM | POA: Diagnosis not present

## 2023-08-11 DIAGNOSIS — M810 Age-related osteoporosis without current pathological fracture: Secondary | ICD-10-CM | POA: Diagnosis not present

## 2023-08-11 DIAGNOSIS — D649 Anemia, unspecified: Secondary | ICD-10-CM | POA: Diagnosis not present

## 2023-08-11 DIAGNOSIS — I700 Atherosclerosis of aorta: Secondary | ICD-10-CM | POA: Diagnosis not present

## 2023-08-11 DIAGNOSIS — Z471 Aftercare following joint replacement surgery: Secondary | ICD-10-CM | POA: Diagnosis not present

## 2023-08-12 ENCOUNTER — Encounter: Payer: Self-pay | Admitting: Gastroenterology

## 2023-08-12 ENCOUNTER — Ambulatory Visit: Admitting: Physical Therapy

## 2023-08-12 ENCOUNTER — Encounter: Payer: Self-pay | Admitting: Surgical

## 2023-08-12 ENCOUNTER — Ambulatory Visit: Admitting: Surgical

## 2023-08-12 ENCOUNTER — Ambulatory Visit: Admitting: Gastroenterology

## 2023-08-12 ENCOUNTER — Ambulatory Visit (INDEPENDENT_AMBULATORY_CARE_PROVIDER_SITE_OTHER): Payer: Self-pay

## 2023-08-12 ENCOUNTER — Other Ambulatory Visit: Payer: Self-pay

## 2023-08-12 ENCOUNTER — Encounter: Payer: Self-pay | Admitting: Physical Therapy

## 2023-08-12 VITALS — BP 118/62 | HR 78 | Ht <= 58 in | Wt 143.0 lb

## 2023-08-12 DIAGNOSIS — M25562 Pain in left knee: Secondary | ICD-10-CM

## 2023-08-12 DIAGNOSIS — G8929 Other chronic pain: Secondary | ICD-10-CM

## 2023-08-12 DIAGNOSIS — Z96652 Presence of left artificial knee joint: Secondary | ICD-10-CM | POA: Diagnosis not present

## 2023-08-12 DIAGNOSIS — K921 Melena: Secondary | ICD-10-CM

## 2023-08-12 DIAGNOSIS — R600 Localized edema: Secondary | ICD-10-CM

## 2023-08-12 DIAGNOSIS — M6281 Muscle weakness (generalized): Secondary | ICD-10-CM | POA: Diagnosis not present

## 2023-08-12 DIAGNOSIS — R2681 Unsteadiness on feet: Secondary | ICD-10-CM

## 2023-08-12 DIAGNOSIS — K922 Gastrointestinal hemorrhage, unspecified: Secondary | ICD-10-CM

## 2023-08-12 DIAGNOSIS — R262 Difficulty in walking, not elsewhere classified: Secondary | ICD-10-CM | POA: Diagnosis not present

## 2023-08-12 DIAGNOSIS — R2689 Other abnormalities of gait and mobility: Secondary | ICD-10-CM

## 2023-08-12 DIAGNOSIS — R933 Abnormal findings on diagnostic imaging of other parts of digestive tract: Secondary | ICD-10-CM | POA: Diagnosis not present

## 2023-08-12 NOTE — Patient Instructions (Signed)
 Your provider has requested that you go to the basement level for lab work before leaving today. Press B on the elevator. The lab is located at the first door on the left as you exit the elevator.  You have been scheduled for a colonoscopy. Please follow written instructions given to you at your visit today.   If you use inhalers (even only as needed), please bring them with you on the day of your procedure.  DO NOT TAKE 7 DAYS PRIOR TO TEST- Trulicity (dulaglutide) Ozempic, Wegovy (semaglutide) Mounjaro (tirzepatide) Bydureon Bcise (exanatide extended release)  DO NOT TAKE 1 DAY PRIOR TO YOUR TEST Rybelsus (semaglutide) Adlyxin (lixisenatide) Victoza (liraglutide) Byetta (exanatide) ____________________________________________________________________  _______________________________________________________  If your blood pressure at your visit was 140/90 or greater, please contact your primary care physician to follow up on this.  _______________________________________________________  If you are age 69 or older, your body mass index should be between 23-30. Your Body mass index is 32.06 kg/m. If this is out of the aforementioned range listed, please consider follow up with your Primary Care Provider.  If you are age 75 or younger, your body mass index should be between 19-25. Your Body mass index is 32.06 kg/m. If this is out of the aformentioned range listed, please consider follow up with your Primary Care Provider.   ________________________________________________________  The [facility] providers would like to encourage you to use MYCHART to communicate with providers for non-urgent requests or questions.  Due to long hold times on the telephone, sending your provider a message by [facility] may be a faster and more efficient way to get a response.  Please allow 48 business hours for a response.  Please remember that this is for non-urgent requests.   _______________________________________________________  [facility] is using a team-based approach to care.  Your team is made up of your doctor and two to three APPS. Our APPS (Nurse Practitioners and Physician Assistants) work with your physician to ensure care continuity for you. They are fully qualified to address your health concerns and develop a treatment plan. They communicate directly with your gastroenterologist to care for you. Seeing the Advanced Practice Practitioners on your physician's team can help you by facilitating care more promptly, often allowing for earlier appointments, access to diagnostic testing, procedures, and other specialty referrals.

## 2023-08-12 NOTE — Progress Notes (Signed)
 08/12/2023 Patricia Hoover [id number] [birth year]   HISTORY OF PRESENT ILLNESS: This is a 77 year old female with past medical history of diverticulosis/diverticular bleeds, iron deficiency anemia, colon polyps, hemorrhoids status post banding, H. pylori, peptic ulcer disease, hypertension, hyperlipidemia.  Just had her knee replaced a couple of weeks ago.  She is here for hospital follow-up of suspected diverticular bleed.  Was hospitalized at [facility] in February for the same.  Seen by our team in June for this suspected diverticular bleed.  Did not require blood transfusion.  She is taking samples of an iron fusion type supplement, but says it is very expensive and wanted to know if she needs iron if she could take something different.  She denies abdominal pain.  Was given a course of Augmentin  for finding of diverticulitis on CT scan although once again she never had any abdominal pain complaints.  Has not had any further bleeding.  CT angio abdomen and pelvis 06/2023:  IMPRESSION: 1. No evidence for active contrast extravasation into the lumen of the bowel to suggest hemorrhage. 2. Diffuse diverticular disease throughout the colon with short segment of colonic wall thickening and pericolonic edema/inflammation in the proximal to mid sigmoid colon. Imaging features most suggestive of diverticulitis. No evidence for contrast extravasation in this region of inflammation. No findings of perforation or abscess. 3. Prominent arterial anatomy along the lateral wall of the gastric body with no accumulation of contrast material in this region on delayed imaging. This appears to represent a vessel on arterial phase imaging and could be a vascular malformation. 4. 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 5. Stable 7 mm right adrenal nodule consistent with benign etiology. No followup imaging is recommended. 6.  Aortic Atherosclerosis (ICD10-I70.0).   March  2019 EGD for follow up on PUD  - Normal esophagus. - Z-line irregular, 38 cm from the incisors. - Erythematous mucosa in the antrum. Biopsied. - Duodenal deformity (characterized by mild angulation at healed ulcer site). Biopsied. - Normal duodenal bulb, second portion of the duodenum and examined duodenum. - Biopsies were obtained in the gastric body, at the incisura and in the gastric antrum.   Feb 2019 EGD and colonoscopy for hematochezia - Normal esophagus. - Z-line regular. - Gastric mucosal atrophy. - One non-bleeding duodenal ulcer with no stigmata of bleeding. This is unlikely to be the source of patient's hematochezia given her clinical presenation and lab findings. The non significant drop in her Hgb since admission is consistent with her hematochezia being from the hemorrhoids seen on her colonoscopy today. The ulcer is likely due to her NSAID use. Testing for H. Pylori with biopsies pending. - Small hiatal hernia. - Biopsies were obtained in the gastric body, at the incisura and in the gastric antrum.   Colonoscopy: - Preparation of the colon was fair. - Non-thrombosed external hemorrhoids found on perianal exam. One of these was large but non bleeding. - Diverticulosis in the sigmoid colon, in the descending colon and in the ascending colon. - The rectum, sigmoid colon, descending colon, transverse colon, ascending colon and cecum are normal. - Non-bleeding internal hemorrhoids. - No specimens collected. - The hemorrhoids were the likely source of her hematochezia as evidenced by her clinical history and labs showing a non-significant drop in her Hgb from baseline.     Past Medical History:  Diagnosis Date   Angioedema 06/15/2017   Arrhythmia    possible hx, resolved prev   Blood transfusion without reported  diagnosis 1972   had reaction; had to stop   Carpal tunnel syndrome    Cataract 2019   resolved with surgery   Complication of anesthesia    takes a long to wake up   Duodenal  ulcer    H/O exercise stress test 2012   normal   Heart murmur    as child   Hematochezia    High cholesterol    History of kidney stones    Hx of colonic polyps    Hypertension    Osteoporosis 2010   t score - 3.9   Peptic ulcer of duodenum    Renal stones    Vitamin D  deficiency 03/01/2021   Past Surgical History:  Procedure Laterality Date   ABDOMINAL HYSTERECTOMY  1972   for endometriosis   CATARACT EXTRACTION W/ INTRAOCULAR LENS IMPLANT Right 06/2017   CATARACT EXTRACTION W/ INTRAOCULAR LENS IMPLANT Left 07/2017   COLONOSCOPY Left 03/02/2017   Procedure: COLONOSCOPY;  Surgeon: Janalyn Keene NOVAK, MD;  Location: [facility] ENDOSCOPY;  Service: Endoscopy;  Laterality: Left;   COLONOSCOPY WITH PROPOFOL  N/A 05/04/2016   Procedure: COLONOSCOPY WITH PROPOFOL ;  Surgeon: Ruel Kung, MD;  Location: [facility] ENDOSCOPY;  Service: Endoscopy;  Laterality: N/A;   ESOPHAGOGASTRODUODENOSCOPY Left 03/02/2017   Procedure: ESOPHAGOGASTRODUODENOSCOPY (EGD);  Surgeon: Janalyn Keene NOVAK, MD;  Location: [facility] ENDOSCOPY;  Service: Endoscopy;  Laterality: Left;   ESOPHAGOGASTRODUODENOSCOPY (EGD) WITH PROPOFOL  N/A 04/08/2017   Procedure: ESOPHAGOGASTRODUODENOSCOPY (EGD) WITH PROPOFOL ;  Surgeon: Janalyn Keene NOVAK, MD;  Location: [facility] ENDOSCOPY;  Service: Endoscopy;  Laterality: N/A;   LITHOTRIPSY     for kidney stone x5   LITHOTRIPSY  09/2017   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 Her smoking use included cigarettes. She has never used smokeless tobacco. She reports that she does not drink alcohol and does not use drugs. family history includes Heart disease in her mother; Heart failure in her mother; Renal Disease in her mother. Allergies  Allergen Reactions   Ace Inhibitors Swelling   Angiotensin Receptor  Blockers     Would avoid, h/o swelling with ACE prev   Aspirin  Other (See Comments)    GI bleed   Bisphosphonates     GI side effects   Nsaids     GI bleed   Prolia [Denosumab]     GI upset      Outpatient Encounter Medications as of 08/12/2023  Medication Sig   amLODipine  (NORVASC ) 2.5 MG tablet Take 1 tablet (2.5 mg total) by mouth daily.   Ascorbic Acid (VITAMIN C GUMMIE PO) Take 2 each by mouth daily.   aspirin  81 MG chewable tablet Chew 1 tablet (81 mg total) by mouth daily.   Cholecalciferol  (VITAMIN D3) 50 MCG (2000 UT) capsule Take 2 capsules (4,000 Units total) by mouth daily.   indapamide  (LOZOL ) 2.5 MG tablet Take 1 tablet (2.5 mg total) by mouth daily.   methocarbamol  (ROBAXIN ) 500 MG tablet Take 1 tablet (500 mg total) by mouth every 8 (eight) hours as needed for muscle spasms.   ondansetron  (ZOFRAN ) 4 MG tablet Take 1 tablet (4 mg total) by mouth every 6 (six) hours as needed for nausea.   polyethylene glycol powder (GLYCOLAX/MIRALAX) 17 GM/SCOOP powder Take 17 g by mouth daily as needed for mild constipation.   Potassium Citrate  15  MEQ (1620 MG) TBCR Take 1 tablet by mouth in the morning and at bedtime.   simvastatin  (ZOCOR ) 10 MG tablet Take 1 tablet (10 mg total) by mouth every other day.   traMADol  (ULTRAM ) 50 MG tablet Take 1 tablet (50 mg total) by mouth every 6 (six) hours as needed.   Iron-FA-B Cmp-C-Biot-Probiotic (FUSION PLUS) CAPS Take 1 capsule by mouth every other day. (Patient not taking: Reported on 08/12/2023)   No facility-administered encounter medications on file as of 08/12/2023.    REVIEW OF SYSTEMS  : All other systems reviewed and negative except where noted in the History of Present Illness.   PHYSICAL EXAM: BP 118/62   Pulse 78   Ht 4' 8 (1.422 m)   Wt 143 lb (64.9 kg)   BMI 32.06 kg/m  General: Well developed female in no acute distress Head: Normocephalic and atraumatic Eyes:  Sclerae anicteric, conjunctiva pink. Ears: Normal auditory  acuity Lungs: Clear throughout to auscultation; no W/R/R. Heart: Regular rate and rhythm; no M/R/G. Rectal:  Will be done at the time of colonoscopy. Musculoskeletal: Symmetrical with no gross deformities  Skin: No lesions on visible extremities Extremities: No edema  Neurological: Alert oriented x 4, grossly non-focal Psychological:  Alert and cooperative. Normal mood and affect  ASSESSMENT AND PLAN: 78 year old female with recurrent painless hematochezia likely diverticular hemorrhage.  CT angio negative for active bleeding. She has a history of recurrent lower GI bleeds ( last one in Feb 2025)   Abnormal colon on CT scan  Sigmoid wall thickening, cannot exclude diverticulitis though she hasn't had any abdominal pain and non-tender.  Completed course of Augmentin .  -Check CBC and iron studies today.  Will make recommendations on whether or not she still needs iron supplements pending those results. -Will schedule colonoscopy with Dr. Shila.  She wants to wait until September since she just had a knee replacement a couple of weeks ago.    CC:  Cleatus Arlyss RAMAN, MD

## 2023-08-12 NOTE — Therapy (Signed)
 OUTPATIENT PHYSICAL THERAPY LOWER EXTREMITY EVALUATION   Patient Name: Patricia Hoover MRN: [id number] DOB:[birth year], 78 y.o., female Today's Date: 08/12/2023  END OF SESSION:  PT End of Session - 08/12/23 1505     Visit Number 1    Number of Visits 17    Date for PT Re-Evaluation 10/07/23    Authorization Type MCR    Authorization Time Period 08/12/23 to 10/07/23    Progress Note Due on Visit 10    PT Start Time 1440   pt had been here but didn't check in for PT at the front right away   PT Stop Time 1512    PT Time Calculation (min) 32 min    Activity Tolerance Patient tolerated treatment well;Patient limited by fatigue    Behavior During Therapy [facility] for tasks assessed/performed          Past Medical History:  Diagnosis Date   Angioedema 06/15/2017   Arrhythmia    possible hx, resolved prev   Blood transfusion without reported diagnosis 1972   had reaction; had to stop   Carpal tunnel syndrome    Cataract 2019   resolved with surgery   Complication of anesthesia    takes a long to wake up   Duodenal ulcer    H/O exercise stress test 2012   normal   Heart murmur    as child   Hematochezia    High cholesterol    History of kidney stones    Hx of colonic polyps    Hypertension    Osteoporosis 2010   t score - 3.9   Peptic ulcer of duodenum    Renal stones    Vitamin D  deficiency 03/01/2021   Past Surgical History:  Procedure Laterality Date   ABDOMINAL HYSTERECTOMY  1972   for endometriosis   CATARACT EXTRACTION W/ INTRAOCULAR LENS IMPLANT Right 06/2017   CATARACT EXTRACTION W/ INTRAOCULAR LENS IMPLANT Left 07/2017   COLONOSCOPY Left 03/02/2017   Procedure: COLONOSCOPY;  Surgeon: Janalyn Keene NOVAK, MD;  Location: [facility] ENDOSCOPY;  Service: Endoscopy;  Laterality: Left;   COLONOSCOPY WITH PROPOFOL  N/A 05/04/2016   Procedure: COLONOSCOPY WITH PROPOFOL ;  Surgeon: Ruel Kung, MD;  Location: [facility] ENDOSCOPY;  Service: Endoscopy;  Laterality: N/A;    ESOPHAGOGASTRODUODENOSCOPY Left 03/02/2017   Procedure: ESOPHAGOGASTRODUODENOSCOPY (EGD);  Surgeon: Janalyn Keene NOVAK, MD;  Location: [facility] ENDOSCOPY;  Service: Endoscopy;  Laterality: Left;   ESOPHAGOGASTRODUODENOSCOPY (EGD) WITH PROPOFOL  N/A 04/08/2017   Procedure: ESOPHAGOGASTRODUODENOSCOPY (EGD) WITH PROPOFOL ;  Surgeon: Janalyn Keene NOVAK, MD;  Location: [facility] ENDOSCOPY;  Service: Endoscopy;  Laterality: N/A;   LITHOTRIPSY     for kidney stone x5   LITHOTRIPSY  09/2017   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   Procedure: ARTHROPLASTY, KNEE, TOTAL, LEFT;  Surgeon: Addie Cordella Hamilton, MD;  Location: [facility] OR;  Service: Orthopedics;  Laterality: Left;   Patient Active Problem List   Diagnosis Date Noted   Arthritis of knee 07/26/2023   Lower GI bleeding 06/29/2023   Hypokalemia 03/09/2023   Aortic atherosclerosis (HCC) 03/09/2023   Unilateral primary osteoarthritis, left knee 11/26/2022   Dysuria 04/21/2022   Stiff-legged gait 04/21/2022   Scalp lesion 09/21/2021   Clavicle enlargement 04/05/2021   Hyperpigmentation 03/01/2021   Vitamin D  deficiency 03/01/2021   Leg pain 05/21/2019   Right leg pain 05/21/2019   GI bleed 03/04/2018   Healthcare maintenance 01/08/2018   Paresthesia 01/08/2018   Neck pain 08/31/2017   Angioedema 06/15/2017   External hemorrhoids    Diverticulosis  of large intestine without diverticulitis    Internal hemorrhoids    Advance care planning 12/23/2016   Renal stone 04/20/2016   Heartburn 12/17/2015   Medicare annual wellness visit, subsequent 06/05/2013   Hyperglycemia 06/05/2013   Knee pain 08/31/2012   Age-related osteoporosis without current pathological fracture 08/18/2012   HTN (hypertension) 08/09/2012   HLD (hyperlipidemia) 08/09/2012    PCP: Cleatus Molly MD   REFERRING PROVIDER: Addie Cordella Hamilton, MD  REFERRING DIAG: Diagnosis [phone] (ICD-10-CM) - Chronic pain of left knee M17.12 (ICD-10-CM) - Unilateral primary  osteoarthritis, left knee  THERAPY DIAG:  Difficulty in walking, not elsewhere classified  Muscle weakness (generalized)  Other abnormalities of gait and mobility  Localized edema  Chronic pain of left knee  Unsteadiness on feet  Rationale for Evaluation and Treatment: Rehabilitation  ONSET DATE: L TKR 07/26/23  SUBJECTIVE:   SUBJECTIVE STATEMENT:  Really tired today, had appts all day. Its been painful. Had HHPT but they finished up. Just saw the MD today, they said it was all good.   PERTINENT HISTORY: See above  PAIN:  Are you having pain? Yes: NPRS scale: 5/10 Pain location: L knee  Pain description: pain when I move it  Aggravating factors: bending it  Relieving factors: nothing besides pain pills   PRECAUTIONS: None  RED FLAGS: None   WEIGHT BEARING RESTRICTIONS: No  FALLS:  Has patient fallen in last 6 months? 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    PATIENT SURVEYS:  PSFS: THE PATIENT SPECIFIC FUNCTIONAL SCALE  Place score of 0-10 (0 = unable to perform activity and 10 = able to perform activity at the same level as before injury or problem)  Activity Date: 08/12/23     Bending knee  5    2. Lifting leg  5    3.     4.      Total Score 5      Total Score = Sum of activity scores/number of activities  Minimally Detectable Change: 3 points (for single activity); 2 points (for average score)  [facility] (nd). The Patient Specific Functional Scale .  Retrieved from [email or web address]   COGNITION: Overall cognitive status: Within functional limits for tasks assessed     SENSATION: WFL  EDEMA:   Localized edema appropriate for post-op state    LOWER EXTREMITY ROM:  Active ROM Right eval Left eval  Hip flexion    Hip extension    Hip abduction    Hip adduction    Hip internal rotation    Hip external rotation    Knee flexion  -9*  Knee extension  67*  Ankle dorsiflexion    Ankle plantarflexion    Ankle inversion    Ankle eversion     (Blank rows = not tested)  LOWER EXTREMITY MMT:  MMT Right eval Left eval  Hip flexion    Hip extension    Hip abduction    Hip adduction    Hip internal rotation    Hip external rotation  Knee flexion  3+  Knee extension  3+  Ankle dorsiflexion    Ankle plantarflexion    Ankle inversion    Ankle eversion     (Blank rows = not tested)    GAIT: Distance walked: in clinic distances  Assistive device utilized: Environmental consultant - 2 wheeled Level of assistance: Modified independence Comments: antalgic, limited knee ROM during gait cycle                                                                                                                                TREATMENT DATE:   08/12/23  Eval, POC   Vaso 34* x10 min    PATIENT EDUCATION:  Education details: exam findings, POC, course of care following TKR  Person educated: Patient Education method: Medical illustrator Education comprehension: verbalized understanding, returned demonstration, and needs further education  HOME EXERCISE PROGRAM: TBD   ASSESSMENT:  CLINICAL IMPRESSION: Patient is a 78 y.o. F who was seen today for physical therapy evaluation and treatment for skilled PT care following L TKR. Very fatigued today, had medical appts all morning and early afternoon- we got basic measures and then finished on vaso. Anticipate she will respond well  to skilled PT services.    OBJECTIVE IMPAIRMENTS: Abnormal gait, decreased activity tolerance, decreased balance, decreased knowledge of use of DME, decreased mobility, difficulty walking, decreased ROM, decreased strength, hypomobility, increased muscle spasms, impaired flexibility, and pain.   ACTIVITY LIMITATIONS: sitting, standing, squatting, sleeping, stairs, transfers, bed mobility, and locomotion level  PARTICIPATION LIMITATIONS: driving, shopping, community activity, yard work, and church  PERSONAL FACTORS: Age, Behavior pattern, Education, Fitness, Past/current experiences, Social background, and Time since onset of injury/illness/exacerbation are also affecting patient's functional outcome.   REHAB POTENTIAL: Good  CLINICAL DECISION MAKING: Stable/uncomplicated  EVALUATION COMPLEXITY: Low   GOALS: Goals reviewed with patient? No  SHORT TERM GOALS: Target date: 09/09/2023   Will be compliant with appropriate progressive HEP Goal status: initial    2. L knee AROM flexion to be at least 95* and extension AROM to be no more than 5* Goal status: initial   3. Will be independent with edema management strategies  Goal status: initial    4. Gait pattern to have normalized with LRAD Goal status: initial    LONG TERM GOALS: Target date: 10/07/2023    MMT to have improved by one grade all weak groups Goal status: initial   2. L Knee flexion AROM to be at least 110* Goal status: initial    3. Will be able to ascend and descend stairs reciprocally with no increase in pain Goal status: initial   4. Will be able to ambulate community distances and perform all household tasks with no increase in pain  Goal status: initial   5. PSFS to have improved by at least 2 points to show improved QOL and subjective improvement     PLAN:  PT FREQUENCY: 2x/week  PT DURATION:  8 weeks  PLANNED INTERVENTIONS: 97750- Physical Performance Testing, 97110-Therapeutic exercises,  97530- Therapeutic activity, [id number]- Neuromuscular re-education, 97535- Self Care, [id number]- Manual therapy, [id number]- Gait training, and [id number]- Vasopneumatic device  PLAN FOR NEXT SESSION: Needs HEP  update (very fatigued at eval, deferred) ROM/strength/gait/edema management as appropriate following TKR   Josette Rough, PT, DPT 08/12/23 3:18 PM

## 2023-08-12 NOTE — Progress Notes (Signed)
 Post-Op Visit Note   Patient: Patricia Hoover           Date of Birth: [birth year]           MRN: [id number] Visit Date: 08/12/2023 PCP: Cleatus Arlyss RAMAN, MD   Assessment & Plan:  Chief Complaint:  Chief Complaint  Patient presents with   Left Knee - Routine Post Op    07/26/23 left TKA   Visit Diagnoses:  1. Status post total left knee replacement     Plan: Patricia Hoover is a 78 y.o. female who presents s/p left total knee arthroplasty on 07/26/2023.  Doing well overall.  Using CPM machine.  They deny any calf pain, shortness of breath, chest pain, abdominal pain.  Pain is overall controlled.  Taking aspirin  for DVT prophylaxis.  Ambulating with walker.  Has been doing home health physical therapy and has her first day of outpatient PT today..   On exam, patient has range of motion 7 degrees extension to 70 degrees of knee flexion..  Incision is healing well without evidence of infection or dehiscence.  2+ DP pulse of the operative extremity.  No calf tenderness, negative Homans' sign.  Able to perform straight leg raise.  Intact ankle dorsiflexion.  Plan is follow-up in 4 weeks for clinical recheck with Dr. Addie.  Counseled Patricia Hoover about the importance of achieving full knee extension (straightening of her knee) by propping her heel on a chair or stool and pushing down on her knee.  Hopefully this along with her outpatient PT will substantially improve her knee range of motion.  She ambulates well with heel-to-toe gait.  No buckling of the knee observed.  Follow-up in 4 weeks.  Continue with aspirin  once daily for DVT prophylaxis..    Follow-Up Instructions: Return in about 4 weeks (around 09/09/2023).   Orders:  Orders Placed This Encounter  Procedures   XR Knee 1-2 Views Left   No orders of the defined types were placed in this encounter.   Imaging: No results found.  PMFS History: Patient Active Problem List   Diagnosis Date Noted   Arthritis of knee 07/26/2023   Lower GI  bleeding 06/29/2023   Hypokalemia 03/09/2023   Aortic atherosclerosis (HCC) 03/09/2023   Unilateral primary osteoarthritis, left knee 11/26/2022   Dysuria 04/21/2022   Stiff-legged gait 04/21/2022   Scalp lesion 09/21/2021   Clavicle enlargement 04/05/2021   Hyperpigmentation 03/01/2021   Vitamin D  deficiency 03/01/2021   Leg pain 05/21/2019   Right leg pain 05/21/2019   GI bleed 03/04/2018   Healthcare maintenance 01/08/2018   Paresthesia 01/08/2018   Neck pain 08/31/2017   Angioedema 06/15/2017   External hemorrhoids    Diverticulosis of large intestine without diverticulitis    Internal hemorrhoids    Advance care planning 12/23/2016   Renal stone 04/20/2016   Heartburn 12/17/2015   Medicare annual wellness visit, subsequent 06/05/2013   Hyperglycemia 06/05/2013   Knee pain 08/31/2012   Age-related osteoporosis without current pathological fracture 08/18/2012   HTN (hypertension) 08/09/2012   HLD (hyperlipidemia) 08/09/2012   Past Medical History:  Diagnosis Date   Angioedema 06/15/2017   Arrhythmia    possible hx, resolved prev   Blood transfusion without reported diagnosis 1972   had reaction; had to stop   Carpal tunnel syndrome    Cataract 2019   resolved with surgery   Complication of anesthesia    takes a long to wake up   Duodenal ulcer    H/O exercise stress  test 2012   normal   Heart murmur    as child   Hematochezia    High cholesterol    History of kidney stones    Hx of colonic polyps    Hypertension    Osteoporosis 2010   t score - 3.9   Peptic ulcer of duodenum    Renal stones    Vitamin D  deficiency 03/01/2021    Family History  Problem Relation Age of Onset   Heart disease Mother    Renal Disease Mother    Heart failure Mother    Colon cancer Neg Hx    Breast cancer Neg Hx     Past Surgical History:  Procedure Laterality Date   ABDOMINAL HYSTERECTOMY  1972   for endometriosis   CATARACT EXTRACTION W/ INTRAOCULAR LENS IMPLANT  Right 06/2017   CATARACT EXTRACTION W/ INTRAOCULAR LENS IMPLANT Left 07/2017   COLONOSCOPY Left 03/02/2017   Procedure: COLONOSCOPY;  Surgeon: Janalyn Keene NOVAK, MD;  Location: [facility] ENDOSCOPY;  Service: Endoscopy;  Laterality: Left;   COLONOSCOPY WITH PROPOFOL  N/A 05/04/2016   Procedure: COLONOSCOPY WITH PROPOFOL ;  Surgeon: Ruel Kung, MD;  Location: [facility] ENDOSCOPY;  Service: Endoscopy;  Laterality: N/A;   ESOPHAGOGASTRODUODENOSCOPY Left 03/02/2017   Procedure: ESOPHAGOGASTRODUODENOSCOPY (EGD);  Surgeon: Janalyn Keene NOVAK, MD;  Location: [facility] ENDOSCOPY;  Service: Endoscopy;  Laterality: Left;   ESOPHAGOGASTRODUODENOSCOPY (EGD) WITH PROPOFOL  N/A 04/08/2017   Procedure: ESOPHAGOGASTRODUODENOSCOPY (EGD) WITH PROPOFOL ;  Surgeon: Janalyn Keene NOVAK, MD;  Location: [facility] ENDOSCOPY;  Service: Endoscopy;  Laterality: N/A;   LITHOTRIPSY     for kidney stone x5   LITHOTRIPSY  09/2017   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   Procedure: ARTHROPLASTY, KNEE, TOTAL, LEFT;  Surgeon: Addie Cordella Hamilton, MD;  Location: [facility] OR;  Service: Orthopedics;  Laterality: Left;   Social History   Occupational History   Occupation: Retired    Comment: Product manager  Tobacco Use   Smoking status: Former    Current packs/day: 0.00    Types: Cigarettes    Quit date: 01/19/1991    Years since quitting: 32.5   Smokeless tobacco: Never  Vaping Use   Vaping status: Never Used  Substance and Sexual Activity   Alcohol use: No    Comment: rare wine   Drug use: No   Sexual activity: Not on file

## 2023-08-17 ENCOUNTER — Telehealth: Payer: Self-pay | Admitting: *Deleted

## 2023-08-17 ENCOUNTER — Telehealth: Payer: Self-pay | Admitting: Radiology

## 2023-08-17 NOTE — Telephone Encounter (Signed)
 Patient called the triage line,  states that she would like someone to send her a MyChart message on how to take care of her wound. She states that she doesn't remember what Herlene told her to do with it.

## 2023-08-17 NOTE — Telephone Encounter (Signed)
 Call to patient and reviewed care of incision after dressing removed last week. She is doing well. Thanks.

## 2023-08-18 NOTE — Telephone Encounter (Signed)
 Called and discussed how to take care of incision

## 2023-08-22 ENCOUNTER — Encounter: Payer: Self-pay | Admitting: Rehabilitative and Restorative Service Providers"

## 2023-08-22 ENCOUNTER — Ambulatory Visit (INDEPENDENT_AMBULATORY_CARE_PROVIDER_SITE_OTHER): Admitting: Rehabilitative and Restorative Service Providers"

## 2023-08-22 DIAGNOSIS — R2681 Unsteadiness on feet: Secondary | ICD-10-CM

## 2023-08-22 DIAGNOSIS — M6281 Muscle weakness (generalized): Secondary | ICD-10-CM | POA: Diagnosis not present

## 2023-08-22 DIAGNOSIS — R600 Localized edema: Secondary | ICD-10-CM

## 2023-08-22 DIAGNOSIS — R2689 Other abnormalities of gait and mobility: Secondary | ICD-10-CM

## 2023-08-22 DIAGNOSIS — R262 Difficulty in walking, not elsewhere classified: Secondary | ICD-10-CM | POA: Diagnosis not present

## 2023-08-22 DIAGNOSIS — M25562 Pain in left knee: Secondary | ICD-10-CM | POA: Diagnosis not present

## 2023-08-22 DIAGNOSIS — G8929 Other chronic pain: Secondary | ICD-10-CM | POA: Diagnosis not present

## 2023-08-22 NOTE — Therapy (Signed)
 OUTPATIENT PHYSICAL THERAPY LOWER EXTREMITY TREATMENT   Patient Name: Patricia Hoover MRN: [id number] DOB:January 12, 1946, 78 y.o., female Today's Date: 08/22/2023  END OF SESSION:  PT End of Session - 08/22/23 1103     Visit Number 2    Number of Visits 17    Date for PT Re-Evaluation 10/07/23    Authorization Type MCR    Authorization Time Period 08/12/23 to 10/07/23    Progress Note Due on Visit 10    PT Start Time 1103    PT Stop Time 1156    PT Time Calculation (min) 53 min    Activity Tolerance Patient tolerated treatment well;Patient limited by fatigue    Behavior During Therapy [facility] for tasks assessed/performed           Past Medical History:  Diagnosis Date   Angioedema 06/15/2017   Arrhythmia    possible hx, resolved prev   Blood transfusion without reported diagnosis 1972   had reaction; had to stop   Carpal tunnel syndrome    Cataract 2019   resolved with surgery   Complication of anesthesia    takes a long to wake up   Duodenal ulcer    H/O exercise stress test 2012   normal   Heart murmur    as child   Hematochezia    High cholesterol    History of kidney stones    Hx of colonic polyps    Hypertension    Osteoporosis 2010   t score - 3.9   Peptic ulcer of duodenum    Renal stones    Vitamin D  deficiency 03/01/2021   Past Surgical History:  Procedure Laterality Date   ABDOMINAL HYSTERECTOMY  1972   for endometriosis   CATARACT EXTRACTION W/ INTRAOCULAR LENS IMPLANT Right 06/2017   CATARACT EXTRACTION W/ INTRAOCULAR LENS IMPLANT Left 07/2017   COLONOSCOPY Left 03/02/2017   Procedure: COLONOSCOPY;  Surgeon: Janalyn Keene NOVAK, MD;  Location: [facility] ENDOSCOPY;  Service: Endoscopy;  Laterality: Left;   COLONOSCOPY WITH PROPOFOL  N/A 05/04/2016   Procedure: COLONOSCOPY WITH PROPOFOL ;  Surgeon: Ruel Kung, MD;  Location: [facility] ENDOSCOPY;  Service: Endoscopy;  Laterality: N/A;   ESOPHAGOGASTRODUODENOSCOPY Left 03/02/2017   Procedure: ESOPHAGOGASTRODUODENOSCOPY  (EGD);  Surgeon: Janalyn Keene NOVAK, MD;  Location: [facility] ENDOSCOPY;  Service: Endoscopy;  Laterality: Left;   ESOPHAGOGASTRODUODENOSCOPY (EGD) WITH PROPOFOL  N/A 04/08/2017   Procedure: ESOPHAGOGASTRODUODENOSCOPY (EGD) WITH PROPOFOL ;  Surgeon: Janalyn Keene NOVAK, MD;  Location: [facility] ENDOSCOPY;  Service: Endoscopy;  Laterality: N/A;   LITHOTRIPSY     for kidney stone x5   LITHOTRIPSY  09/2017   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   Procedure: ARTHROPLASTY, KNEE, TOTAL, LEFT;  Surgeon: Addie Cordella Hamilton, MD;  Location: [facility] OR;  Service: Orthopedics;  Laterality: Left;   Patient Active Problem List   Diagnosis Date Noted   Arthritis of left knee 07/26/2023   Lower GI bleeding 06/29/2023   Hypokalemia 03/09/2023   Aortic atherosclerosis (HCC) 03/09/2023   Unilateral primary osteoarthritis, left knee 11/26/2022   Dysuria 04/21/2022   Stiff-legged gait 04/21/2022   Scalp lesion 09/21/2021   Clavicle enlargement 04/05/2021   Hyperpigmentation 03/01/2021   Vitamin D  deficiency 03/01/2021   Leg pain 05/21/2019   Right leg pain 05/21/2019   GI bleed 03/04/2018   Healthcare maintenance 01/08/2018   Paresthesia 01/08/2018   Neck pain 08/31/2017   Angioedema 06/15/2017   External hemorrhoids    Diverticulosis of large intestine without diverticulitis    Internal hemorrhoids    Advance  care planning 12/23/2016   Renal stone 04/20/2016   Heartburn 12/17/2015   Medicare annual wellness visit, subsequent 06/05/2013   Hyperglycemia 06/05/2013   Knee pain 08/31/2012   Age-related osteoporosis without current pathological fracture 08/18/2012   HTN (hypertension) 08/09/2012   HLD (hyperlipidemia) 08/09/2012    PCP: Cleatus Molly MD   REFERRING PROVIDER: Addie Cordella Hamilton, MD  REFERRING DIAG: Diagnosis [phone] (ICD-10-CM) - Chronic pain of left knee M17.12 (ICD-10-CM) - Unilateral primary osteoarthritis, left knee  THERAPY DIAG:  Difficulty in walking, not elsewhere  classified  Muscle weakness (generalized)  Other abnormalities of gait and mobility  Localized edema  Chronic pain of left knee  Unsteadiness on feet  Rationale for Evaluation and Treatment: Rehabilitation  ONSET DATE: L TKR 07/26/23  SUBJECTIVE:   SUBJECTIVE STATEMENT: Loriel notes good early HEP compliance.  She notes sleep is interrupted every 2-3 hours.  She has to get up and move around before getting back to sleep.  PERTINENT HISTORY: See above  PAIN:  Are you having pain? Yes: NPRS scale: 0-5/10, briefly more with flexion Pain location: L knee  Pain description: pain when I move it  Aggravating factors: bending it  Relieving factors: nothing besides pain pills   PRECAUTIONS: None  RED FLAGS: None   WEIGHT BEARING RESTRICTIONS: No  FALLS:  Has patient fallen in last 6 months? 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    PATIENT SURVEYS:  PSFS: THE PATIENT SPECIFIC FUNCTIONAL SCALE  Place score of 0-10 (0 = unable to perform activity and 10 = able to perform activity at the same level as before injury or problem)  Activity Date: 08/12/23     Bending knee  5    2. Lifting leg  5    3.     4.      Total Score 5      Total Score = Sum of activity scores/number of activities  Minimally Detectable Change: 3 points (for single activity); 2 points (for average score)  [facility] (nd). The Patient Specific Functional Scale . Retrieved from  [email or web address]   COGNITION: Overall cognitive status: Within functional limits for tasks assessed     SENSATION: WFL  EDEMA:   Localized edema appropriate for post-op state    LOWER EXTREMITY ROM:  Active ROM Right eval Left eval Left 08/22/2023  Hip flexion     Hip extension     Hip abduction     Hip adduction     Hip internal rotation     Hip external rotation     Knee flexion  -9* 64*  Knee extension  67* -5*  Ankle dorsiflexion     Ankle plantarflexion     Ankle inversion     Ankle eversion      (Blank rows = not tested)  LOWER EXTREMITY MMT:  MMT Right eval Left eval  Hip flexion    Hip extension    Hip abduction    Hip adduction    Hip internal rotation  Hip external rotation    Knee flexion  3+  Knee extension  3+  Ankle dorsiflexion    Ankle plantarflexion    Ankle inversion    Ankle eversion     (Blank rows = not tested)    GAIT: Distance walked: in clinic distances  Assistive device utilized: Environmental consultant - 2 wheeled Level of assistance: Modified independence Comments: antalgic, limited knee ROM during gait cycle                                                                                                                                TREATMENT DATE:  08/22/2023 Recumbent bike Seat 4 for 5 minutes Quadriceps sets with heel prop 2 sets of 10 for 5 seconds Seated knee flexion AAROM (right pushes left into flexion) 10 x 10 seconds  Functional Activities: Double Leg Press 75 pounds 15 reps full extension, stretch into flexion and slow eccentric contraction Single leg press 37 pounds 10 reps bilaterally full extension, stretch into flexion and slow eccentric contraction  Vaso left knee 10 minutes Medium pressure 34 degrees   08/12/23  Eval, POC   Vaso 34* x10 min    PATIENT EDUCATION:  Education details: exam findings, POC, course of care following TKR  Person educated:  Patient Education method: Explanation and Demonstration Education comprehension: verbalized understanding, returned demonstration, and needs further education  HOME EXERCISE PROGRAM:  Access Code: [email or web address] URL: [email or web address] Date: 08/22/2023 Prepared by: Lamar Ivory  Exercises - Seated Knee Flexion AAROM  - 5 x daily - 7 x weekly - 1 sets - 10 reps - 10 seconds hold - Supine Quadricep Sets  - 5 x daily - 7 x weekly - 2 sets - 10 reps - 5 second hold - Supine Heel Slide with Strap  - 5 x daily - 7 x weekly - 1 sets - 10 reps - 10 seconds hold  ASSESSMENT:  CLINICAL IMPRESSION: Shalayne did a good job with her supervised physical therapy today.  Early emphasis will be on active range of motion, both flexion and extension although flexion is more limited, edema control and quadriceps strength.    Patient is a 78 y.o. F who was seen today for physical therapy evaluation and treatment for skilled PT care following L TKR. Very fatigued today, had medical appts all morning and early afternoon- we got basic measures and then finished on vaso. Anticipate she will respond well to skilled PT services.    OBJECTIVE IMPAIRMENTS: Abnormal gait, decreased activity tolerance, decreased balance, decreased knowledge of use of DME, decreased mobility, difficulty walking, decreased ROM, decreased strength, hypomobility, increased muscle spasms, impaired flexibility, and pain.   ACTIVITY LIMITATIONS: sitting, standing, squatting, sleeping, stairs, transfers, bed mobility, and locomotion level  PARTICIPATION LIMITATIONS: driving, shopping, community activity, yard work, and church  PERSONAL FACTORS: Age, Behavior pattern, Education, Fitness, Past/current experiences, Social background, and Time since onset of injury/illness/exacerbation are also affecting patient's functional outcome.  REHAB POTENTIAL: Good  CLINICAL DECISION MAKING: Stable/uncomplicated  EVALUATION COMPLEXITY:  Low   GOALS: Goals reviewed with patient? No  SHORT TERM GOALS: Target date: 09/09/2023   Will be compliant with appropriate progressive HEP Goal status: Started 08/22/2023   2. L knee AROM flexion to be at least 95* and extension AROM to be no more than 5* Goal status: Partially met 08/22/2023   3. Will be independent with edema management strategies  Goal status: initial    4. Gait pattern to have normalized with LRAD Goal status: initial    LONG TERM GOALS: Target date: 10/07/2023    MMT to have improved by one grade all weak groups Goal status: initial   2. L Knee flexion AROM to be at least 110* Goal status: initial    3. Will be able to ascend and descend stairs reciprocally with no increase in pain Goal status: initial   4. Will be able to ambulate community distances and perform all household tasks with no increase in pain  Goal status: initial   5. PSFS to have improved by at least 2 points to show improved QOL and subjective improvement     PLAN:  PT FREQUENCY: 2x/week  PT DURATION: 8 weeks  PLANNED INTERVENTIONS: 97750- Physical Performance Testing, 97110-Therapeutic exercises, 97530- Therapeutic activity, [id number]- Neuromuscular re-education, 97535- Self Care, [id number]- Manual therapy, 97116- Gait training, and 97016- Vasopneumatic device  PLAN FOR NEXT SESSION: Active range of motion, with particular emphasis on flexion.  Quadriceps strengthening and edema control. Myer LELON Ivory, PT, MPT 08/22/23 3:51 PM

## 2023-08-24 ENCOUNTER — Ambulatory Visit (INDEPENDENT_AMBULATORY_CARE_PROVIDER_SITE_OTHER): Admitting: Rehabilitative and Restorative Service Providers"

## 2023-08-24 ENCOUNTER — Encounter: Payer: Self-pay | Admitting: Rehabilitative and Restorative Service Providers"

## 2023-08-24 DIAGNOSIS — R2681 Unsteadiness on feet: Secondary | ICD-10-CM

## 2023-08-24 DIAGNOSIS — M6281 Muscle weakness (generalized): Secondary | ICD-10-CM | POA: Diagnosis not present

## 2023-08-24 DIAGNOSIS — R600 Localized edema: Secondary | ICD-10-CM | POA: Diagnosis not present

## 2023-08-24 DIAGNOSIS — M25562 Pain in left knee: Secondary | ICD-10-CM | POA: Diagnosis not present

## 2023-08-24 DIAGNOSIS — R2689 Other abnormalities of gait and mobility: Secondary | ICD-10-CM

## 2023-08-24 DIAGNOSIS — R262 Difficulty in walking, not elsewhere classified: Secondary | ICD-10-CM

## 2023-08-24 DIAGNOSIS — G8929 Other chronic pain: Secondary | ICD-10-CM

## 2023-08-24 NOTE — Therapy (Signed)
 OUTPATIENT PHYSICAL THERAPY LOWER EXTREMITY TREATMENT   Patient Name: Patricia Hoover MRN: [id number] DOB:02-26-1945, 78 y.o., female Today's Date: 08/24/2023  END OF SESSION:  PT End of Session - 08/24/23 1157     Visit Number 3    Number of Visits 17    Date for PT Re-Evaluation 10/07/23    Authorization Type MCR    Authorization Time Period 08/12/23 to 10/07/23    Progress Note Due on Visit 10    PT Start Time 1143    PT Stop Time 1239    PT Time Calculation (min) 56 min    Activity Tolerance Patient tolerated treatment well;No increased pain;Patient limited by pain    Behavior During Therapy [facility] for tasks assessed/performed            Past Medical History:  Diagnosis Date   Angioedema 06/15/2017   Arrhythmia    possible hx, resolved prev   Blood transfusion without reported diagnosis 1972   had reaction; had to stop   Carpal tunnel syndrome    Cataract 2019   resolved with surgery   Complication of anesthesia    takes a long to wake up   Duodenal ulcer    H/O exercise stress test 2012   normal   Heart murmur    as child   Hematochezia    High cholesterol    History of kidney stones    Hx of colonic polyps    Hypertension    Osteoporosis 2010   t score - 3.9   Peptic ulcer of duodenum    Renal stones    Vitamin D  deficiency 03/01/2021   Past Surgical History:  Procedure Laterality Date   ABDOMINAL HYSTERECTOMY  1972   for endometriosis   CATARACT EXTRACTION W/ INTRAOCULAR LENS IMPLANT Right 06/2017   CATARACT EXTRACTION W/ INTRAOCULAR LENS IMPLANT Left 07/2017   COLONOSCOPY Left 03/02/2017   Procedure: COLONOSCOPY;  Surgeon: Patricia Keene NOVAK, MD;  Location: [facility] ENDOSCOPY;  Service: Endoscopy;  Laterality: Left;   COLONOSCOPY WITH PROPOFOL  N/A 05/04/2016   Procedure: COLONOSCOPY WITH PROPOFOL ;  Surgeon: Patricia Kung, MD;  Location: [facility] ENDOSCOPY;  Service: Endoscopy;  Laterality: N/A;   ESOPHAGOGASTRODUODENOSCOPY Left 03/02/2017   Procedure:  ESOPHAGOGASTRODUODENOSCOPY (EGD);  Surgeon: Patricia Keene NOVAK, MD;  Location: [facility] ENDOSCOPY;  Service: Endoscopy;  Laterality: Left;   ESOPHAGOGASTRODUODENOSCOPY (EGD) WITH PROPOFOL  N/A 04/08/2017   Procedure: ESOPHAGOGASTRODUODENOSCOPY (EGD) WITH PROPOFOL ;  Surgeon: Patricia Keene NOVAK, MD;  Location: [facility] ENDOSCOPY;  Service: Endoscopy;  Laterality: N/A;   LITHOTRIPSY     for kidney stone x5   LITHOTRIPSY  09/2017   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   Procedure: ARTHROPLASTY, KNEE, TOTAL, LEFT;  Surgeon: Patricia Cordella Hamilton, MD;  Location: [facility] OR;  Service: Orthopedics;  Laterality: Left;   Patient Active Problem List   Diagnosis Date Noted   Arthritis of left knee 07/26/2023   Lower GI bleeding 06/29/2023   Hypokalemia 03/09/2023   Aortic atherosclerosis (HCC) 03/09/2023   Unilateral primary osteoarthritis, left knee 11/26/2022   Dysuria 04/21/2022   Stiff-legged gait 04/21/2022   Scalp lesion 09/21/2021   Clavicle enlargement 04/05/2021   Hyperpigmentation 03/01/2021   Vitamin D  deficiency 03/01/2021   Leg pain 05/21/2019   Right leg pain 05/21/2019   GI bleed 03/04/2018   Healthcare maintenance 01/08/2018   Paresthesia 01/08/2018   Neck pain 08/31/2017   Angioedema 06/15/2017   External hemorrhoids    Diverticulosis of large intestine without diverticulitis    Internal hemorrhoids  Advance care planning 12/23/2016   Renal stone 04/20/2016   Heartburn 12/17/2015   Medicare annual wellness visit, subsequent 06/05/2013   Hyperglycemia 06/05/2013   Knee pain 08/31/2012   Age-related osteoporosis without current pathological fracture 08/18/2012   HTN (hypertension) 08/09/2012   HLD (hyperlipidemia) 08/09/2012    PCP: Patricia Molly MD   REFERRING PROVIDER: Addie Cordella Hamilton, MD  REFERRING DIAG: Diagnosis [phone] (ICD-10-CM) - Chronic pain of left knee M17.12 (ICD-10-CM) - Unilateral primary osteoarthritis, left knee  THERAPY DIAG:  Difficulty in  walking, not elsewhere classified  Muscle weakness (generalized)  Other abnormalities of gait and mobility  Localized edema  Chronic pain of left knee  Unsteadiness on feet  Rationale for Evaluation and Treatment: Rehabilitation  ONSET DATE: L TKR 07/26/23  SUBJECTIVE:   SUBJECTIVE STATEMENT: Patricia Hoover reports good HEP compliance yesterday.  Flexion AROM remains her primary concern.  PERTINENT HISTORY: See above  PAIN:  Are you having pain? Yes: NPRS scale: 0-5/10, briefly more with flexion Pain location: L knee  Pain description: pain when I move it  Aggravating factors: bending it  Relieving factors: nothing besides pain pills   PRECAUTIONS: None  RED FLAGS: None   WEIGHT BEARING RESTRICTIONS: No  FALLS:  Has patient fallen in last 6 months? No  LIVING ENVIRONMENT: Lives with: lives with their family Lives in: House/apartment Stairs:  flight inside but does not have to go up  Has following equipment at home: Patricia Hoover - 2 wheeled  OCCUPATION: retiredWater engineer at an [employer]   PLOF: Independent, Independent with basic ADLs, Independent with gait, and Independent with transfers  PATIENT GOALS: be able to line dance   NEXT MD VISIT: Referring 09/09/23  OBJECTIVE:  Note: Objective measures were completed at Evaluation unless otherwise noted.    PATIENT SURVEYS:  PSFS: THE PATIENT SPECIFIC FUNCTIONAL SCALE  Place score of 0-10 (0 = unable to perform activity and 10 = able to perform activity at the same level as before injury or problem)  Activity Date: 08/12/23     Bending knee  5    2. Lifting leg  5    3.     4.      Total Score 5      Total Score = Sum of activity scores/number of activities  Minimally Detectable Change: 3 points (for single activity); 2 points (for average score)  [facility] (nd). The Patient Specific Functional Scale . Retrieved from [email or web address]    COGNITION: Overall cognitive status: Within functional limits for tasks assessed     SENSATION: WFL  EDEMA:   Localized edema appropriate for post-op state    LOWER EXTREMITY ROM:  Active ROM Right eval Left eval Left 08/22/2023 Left 08/24/2023  Hip flexion      Hip extension      Hip abduction      Hip adduction      Hip internal rotation      Hip external rotation      Knee flexion  -9* 64* 73*  Knee extension  67* -5* -5*  Ankle dorsiflexion      Ankle plantarflexion      Ankle inversion      Ankle eversion       (Blank rows = not tested)  LOWER EXTREMITY MMT:  MMT Right eval Left eval  Hip flexion    Hip extension    Hip abduction    Hip adduction    Hip internal rotation    Hip  external rotation    Knee flexion  3+  Knee extension  3+  Ankle dorsiflexion    Ankle plantarflexion    Ankle inversion    Ankle eversion     (Blank rows = not tested)    GAIT: Distance walked: in clinic distances  Assistive device utilized: Environmental consultant - 2 wheeled Level of assistance: Modified independence Comments: antalgic, limited knee ROM during gait cycle                                                                                                                                TREATMENT DATE:  08/24/2023 Recumbent bike Seat 4 for 5 minutes, then 5 full revolutions forward and back (PT assist) Quadriceps sets with heel prop 2 sets of 10 for 5 seconds Seated knee flexion AAROM (right pushes left into flexion) 10 x 10 seconds Tailgate knee flexion 1 minute (focus on pulling into flexion)  Functional Activities: Double Leg Press 75 pounds 15 reps full extension, stretch into flexion and slow eccentric contraction (PT assist with flexion) Single leg press 31 pounds 10 reps bilaterally full extension, stretch into flexion and slow eccentric contraction  Vaso left knee 10 minutes Medium pressure 34 degrees   08/22/2023 Recumbent bike Seat 4 for 5 minutes Quadriceps sets  with heel prop 2 sets of 10 for 5 seconds Seated knee flexion AAROM (right pushes left into flexion) 10 x 10 seconds  Functional Activities: Double Leg Press 75 pounds 15 reps full extension, stretch into flexion and slow eccentric contraction Single leg press 37 pounds 10 reps bilaterally full extension, stretch into flexion and slow eccentric contraction  Vaso left knee 10 minutes Medium pressure 34 degrees   08/12/23  Eval, POC   Vaso 34* x10 min    PATIENT EDUCATION:  Education details: exam findings, POC, course of care following TKR  Person educated: Patient Education method: Explanation and Demonstration Education comprehension: verbalized understanding, returned demonstration, and needs further education  HOME EXERCISE PROGRAM:  Access Code: [email or web address] URL: [email or web address] Date: 08/22/2023 Prepared by: Lamar Ivory  Exercises - Seated Knee Flexion AAROM  - 5 x daily - 7 x weekly - 1 sets - 10 reps - 10 seconds hold - Supine Quadricep Sets  - 5 x daily - 7 x weekly - 2 sets - 10 reps - 5 second hold - Supine Heel Slide with Strap  - 5 x daily - 7 x weekly - 1 sets - 10 reps - 10 seconds hold  ASSESSMENT:  CLINICAL IMPRESSION: Mekesha had improved flexion AROM as compared to Monday.  Emphasis remains active range of motion, both flexion and extension although flexion is more limited, edema control and quadriceps strength.    Patient is a 78 y.o. F who was seen today for physical therapy evaluation and treatment for skilled PT care following L TKR. Very fatigued today, had medical appts all morning and early afternoon- we got basic measures and then  finished on vaso. Anticipate she will respond well to skilled PT services.    OBJECTIVE IMPAIRMENTS: Abnormal gait, decreased activity tolerance, decreased balance, decreased knowledge of use of DME, decreased mobility, difficulty walking, decreased ROM, decreased strength, hypomobility, increased muscle  spasms, impaired flexibility, and pain.   ACTIVITY LIMITATIONS: sitting, standing, squatting, sleeping, stairs, transfers, bed mobility, and locomotion level  PARTICIPATION LIMITATIONS: driving, shopping, community activity, yard work, and church  PERSONAL FACTORS: Age, Behavior pattern, Education, Fitness, Past/current experiences, Social background, and Time since onset of injury/illness/exacerbation are also affecting patient's functional outcome.   REHAB POTENTIAL: Good  CLINICAL DECISION MAKING: Stable/uncomplicated  EVALUATION COMPLEXITY: Low   GOALS: Goals reviewed with patient? No  SHORT TERM GOALS: Target date: 09/09/2023   Will be compliant with appropriate progressive HEP Goal status: On Going 08/24/2023   2. L knee AROM flexion to be at least 95* and extension AROM to be no more than 5* Goal status: Partially met 08/22/2023   3. Will be independent with edema management strategies  Goal status: initial    4. Gait pattern to have normalized with LRAD Goal status: initial    LONG TERM GOALS: Target date: 10/07/2023    MMT to have improved by one grade all weak groups Goal status: initial   2. L Knee flexion AROM to be at least 110* Goal status: initial    3. Will be able to ascend and descend stairs reciprocally with no increase in pain Goal status: initial   4. Will be able to ambulate community distances and perform all household tasks with no increase in pain  Goal status: initial   5. PSFS to have improved by at least 2 points to show improved QOL and subjective improvement     PLAN:  PT FREQUENCY: 2x/week  PT DURATION: 8 weeks  PLANNED INTERVENTIONS: 97750- Physical Performance Testing, 97110-Therapeutic exercises, 97530- Therapeutic activity, [id number]- Neuromuscular re-education, 97535- Self Care, [id number]- Manual therapy, 97116- Gait training, and 97016- Vasopneumatic device  PLAN FOR NEXT SESSION: Active range of motion, with particular  emphasis on flexion.  Quadriceps strengthening and edema control.  Myer LELON Ivory, PT, MPT 08/24/23 12:36 PM

## 2023-08-30 ENCOUNTER — Ambulatory Visit: Admitting: Rehabilitative and Restorative Service Providers"

## 2023-08-30 ENCOUNTER — Encounter: Payer: Self-pay | Admitting: Rehabilitative and Restorative Service Providers"

## 2023-08-30 ENCOUNTER — Encounter: Admitting: Rehabilitative and Restorative Service Providers"

## 2023-08-30 DIAGNOSIS — M6281 Muscle weakness (generalized): Secondary | ICD-10-CM | POA: Diagnosis not present

## 2023-08-30 DIAGNOSIS — R600 Localized edema: Secondary | ICD-10-CM | POA: Diagnosis not present

## 2023-08-30 DIAGNOSIS — M25562 Pain in left knee: Secondary | ICD-10-CM

## 2023-08-30 DIAGNOSIS — G8929 Other chronic pain: Secondary | ICD-10-CM

## 2023-08-30 DIAGNOSIS — R2689 Other abnormalities of gait and mobility: Secondary | ICD-10-CM

## 2023-08-30 DIAGNOSIS — R2681 Unsteadiness on feet: Secondary | ICD-10-CM

## 2023-08-30 DIAGNOSIS — R262 Difficulty in walking, not elsewhere classified: Secondary | ICD-10-CM | POA: Diagnosis not present

## 2023-08-30 NOTE — Therapy (Signed)
 OUTPATIENT PHYSICAL THERAPY TREATMENT   Patient Name: Patricia Hoover MRN: [id number] DOB:03-23-1945, 78 y.o., female Today's Date: 08/30/2023  END OF SESSION:  PT End of Session - 08/30/23 1334     Visit Number 4    Number of Visits 17    Date for PT Re-Evaluation 10/07/23    Authorization Type MCR    Authorization Time Period 08/12/23 to 10/07/23    Progress Note Due on Visit 10    PT Start Time 1342    PT Stop Time 1432    PT Time Calculation (min) 50 min    Activity Tolerance Patient tolerated treatment well;No increased pain    Behavior During Therapy WFL for tasks assessed/performed             Past Medical History:  Diagnosis Date   Angioedema 06/15/2017   Arrhythmia    possible hx, resolved prev   Blood transfusion without reported diagnosis 1972   had reaction; had to stop   Carpal tunnel syndrome    Cataract 2019   resolved with surgery   Complication of anesthesia    takes a long to wake up   Duodenal ulcer    H/O exercise stress test 2012   normal   Heart murmur    as child   Hematochezia    High cholesterol    History of kidney stones    Hx of colonic polyps    Hypertension    Osteoporosis 2010   t score - 3.9   Peptic ulcer of duodenum    Renal stones    Vitamin D  deficiency 03/01/2021   Past Surgical History:  Procedure Laterality Date   ABDOMINAL HYSTERECTOMY  1972   for endometriosis   CATARACT EXTRACTION W/ INTRAOCULAR LENS IMPLANT Right 06/2017   CATARACT EXTRACTION W/ INTRAOCULAR LENS IMPLANT Left 07/2017   COLONOSCOPY Left 03/02/2017   Procedure: COLONOSCOPY;  Surgeon: Janalyn Keene NOVAK, MD;  Location: [facility] ENDOSCOPY;  Service: Endoscopy;  Laterality: Left;   COLONOSCOPY WITH PROPOFOL  N/A 05/04/2016   Procedure: COLONOSCOPY WITH PROPOFOL ;  Surgeon: Ruel Kung, MD;  Location: [facility] ENDOSCOPY;  Service: Endoscopy;  Laterality: N/A;   ESOPHAGOGASTRODUODENOSCOPY Left 03/02/2017   Procedure: ESOPHAGOGASTRODUODENOSCOPY (EGD);  Surgeon:  Janalyn Keene NOVAK, MD;  Location: [facility] ENDOSCOPY;  Service: Endoscopy;  Laterality: Left;   ESOPHAGOGASTRODUODENOSCOPY (EGD) WITH PROPOFOL  N/A 04/08/2017   Procedure: ESOPHAGOGASTRODUODENOSCOPY (EGD) WITH PROPOFOL ;  Surgeon: Janalyn Keene NOVAK, MD;  Location: [facility] ENDOSCOPY;  Service: Endoscopy;  Laterality: N/A;   LITHOTRIPSY     for kidney stone x5   LITHOTRIPSY  09/2017   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   Procedure: ARTHROPLASTY, KNEE, TOTAL, LEFT;  Surgeon: Addie Cordella Hamilton, MD;  Location: [facility] OR;  Service: Orthopedics;  Laterality: Left;   Patient Active Problem List   Diagnosis Date Noted   Arthritis of left knee 07/26/2023   Lower GI bleeding 06/29/2023   Hypokalemia 03/09/2023   Aortic atherosclerosis (HCC) 03/09/2023   Unilateral primary osteoarthritis, left knee 11/26/2022   Dysuria 04/21/2022   Stiff-legged gait 04/21/2022   Scalp lesion 09/21/2021   Clavicle enlargement 04/05/2021   Hyperpigmentation 03/01/2021   Vitamin D  deficiency 03/01/2021   Leg pain 05/21/2019   Right leg pain 05/21/2019   GI bleed 03/04/2018   Healthcare maintenance 01/08/2018   Paresthesia 01/08/2018   Neck pain 08/31/2017   Angioedema 06/15/2017   External hemorrhoids    Diverticulosis of large intestine without diverticulitis    Internal hemorrhoids    Advance care  planning 12/23/2016   Renal stone 04/20/2016   Heartburn 12/17/2015   Medicare annual wellness visit, subsequent 06/05/2013   Hyperglycemia 06/05/2013   Knee pain 08/31/2012   Age-related osteoporosis without current pathological fracture 08/18/2012   HTN (hypertension) 08/09/2012   HLD (hyperlipidemia) 08/09/2012    PCP: Cleatus Molly MD   REFERRING PROVIDER: Addie Cordella Hamilton, MD  REFERRING DIAG: Diagnosis [phone] (ICD-10-CM) - Chronic pain of left knee M17.12 (ICD-10-CM) - Unilateral primary osteoarthritis, left knee  THERAPY DIAG:  Difficulty in walking, not elsewhere classified  Muscle  weakness (generalized)  Other abnormalities of gait and mobility  Localized edema  Chronic pain of left knee  Unsteadiness on feet  Rationale for Evaluation and Treatment: Rehabilitation  ONSET DATE: Lt TKR 07/26/23  SUBJECTIVE:   SUBJECTIVE STATEMENT: Pt indicated no pain at rest in clinic today.  Reported nighttime struggles with pain and tightness.   PERTINENT HISTORY: See above   PAIN:   NPRS scale: up to 5/10 at night.  Pain location: Lt knee  Pain description:  stiffness, tightness.  Aggravating factors: bending it  Relieving factors: nothing besides pain pills   PRECAUTIONS: None  RED FLAGS: None   WEIGHT BEARING RESTRICTIONS: No  FALLS:  Has patient fallen in last 6 months? 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  PATIENT SURVEYS:  PSFS: THE PATIENT SPECIFIC FUNCTIONAL SCALE  Place score of 0-10 (0 = unable to perform activity and 10 = able to perform activity at the same level as before injury or problem)  Activity Date: 08/12/23     Bending knee  5    2. Lifting leg  5    3.     4.      Total Score 5      Total Score = Sum of activity scores/number of activities  Minimally Detectable Change: 3 points (for single activity); 2 points (for average score)   COGNITION: 08/12/2023 Overall cognitive status: Within functional limits for tasks assessed     SENSATION: 08/12/2023 [facility]  EDEMA:  08/12/2023 Localized edema appropriate for post-op state    LOWER EXTREMITY  ROM:  Active ROM Right eval Left eval Left  08/22/2023 Left  08/24/2023  Hip flexion      Hip extension      Hip abduction      Hip adduction      Hip internal rotation      Hip external rotation      Knee flexion  -9* 64* 73*  Knee extension  67* -5* -5*  Ankle dorsiflexion      Ankle plantarflexion      Ankle inversion      Ankle eversion       (Blank rows = not tested)  LOWER EXTREMITY MMT:  MMT Right eval Left Eval 08/12/2023  Hip flexion    Hip extension    Hip abduction    Hip adduction    Hip internal rotation    Hip external rotation    Knee flexion  3+  Knee extension  3+  Ankle dorsiflexion    Ankle plantarflexion    Ankle inversion    Ankle eversion     (Blank rows = not tested)    GAIT: 08/30/2023: Ambulation in clinic c SPC in Rt UE with SBA.  Reduced knee extension in stance, maintaining knee flexion.   08/12/2023 Distance walked: in clinic distances  Assistive device utilized: Environmental consultant - 2 wheeled Level of assistance: Modified independence Comments: antalgic, limited knee ROM during gait cycle                  TREATMENT       DATE: 08/30/2023 Therex:  Nustep Lvl 5 for ROM of knee UE/LE 8 mins, movement to tolerance for good stretch.  Seated Lt knee flexion hamstring curl green band x 15 Seated Lt knee LAQ with 2.5 lb weight with end range pauses and contralateral leg movement opposite 2 x 15  Incline board gastroc stretch 30 sec x 3 bilateral   Gait Training SPC use in clinic household distances < 150 ft several times with cane in Rt UE.  Cues for sequencing and placement give at times in activity.  SBA required.   Neuro Re-ed Tandem stance 1 min x 2 bilateral with occasional HHA on bar Feet closer alternating heel/toe lifts x 15 with occasional HHA on bar   Manual Seated Lt knee flexion c distraction/IR mobilization c movements.  Contralateral leg movement opposite.   Vaso 10 mins Lt leg in elevation medium compression 34 degrees                                                                                                                               TREATMENT       DATE: 08/24/2023 Recumbent bike Seat 4 for 5 minutes, then 5 full revolutions forward and back (PT assist) Quadriceps sets with heel prop 2 sets of 10 for 5 seconds Seated knee flexion AAROM (right pushes left into flexion) 10 x 10 seconds Tailgate knee flexion 1 minute (focus on pulling into flexion)  Functional Activities: Double Leg Press 75 pounds 15 reps full extension, stretch into flexion and slow eccentric contraction (PT assist with flexion) Single leg press 31 pounds 10 reps bilaterally full extension, stretch into flexion and slow eccentric contraction  Vaso left knee 10 minutes Medium pressure 34 degrees   TREATMENT       DATE:08/22/2023 Recumbent bike Seat 4 for 5 minutes Quadriceps sets with heel prop 2 sets of 10 for 5 seconds Seated knee flexion AAROM (right pushes left into flexion) 10 x 10 seconds  Functional Activities: Double Leg Press 75 pounds 15 reps full extension, stretch into flexion and slow eccentric contraction Single leg press 37 pounds 10 reps bilaterally full extension, stretch into flexion and slow eccentric contraction  Vaso left knee 10 minutes Medium pressure 34 degrees     PATIENT EDUCATION:  Education details: exam findings, POC, course of care following TKR  Person educated: Patient Education method: Medical illustrator Education comprehension: verbalized understanding, returned demonstration, and needs further education  HOME EXERCISE PROGRAM:  Access Code: [email or web address] URL: [email or web address] Date: 08/22/2023 Prepared by: Lamar Ivory  Exercises - Seated Knee Flexion AAROM  - 5 x daily - 7 x weekly - 1 sets - 10 reps - 10 seconds hold - Supine Quadricep Sets  - 5 x daily - 7 x weekly - 2 sets - 10 reps - 5 second hold - Supine Heel Slide with Strap  - 5 x daily - 7 x weekly - 1 sets  - 10 reps - 10 seconds hold  ASSESSMENT:  CLINICAL IMPRESSION: Flexion quality improving overall but still limited compared to goal progression.  SPC use in clinic with SBA, not 100% ready for community ambulation.  Continued skilled PT services indicated at this time.   OBJECTIVE IMPAIRMENTS: Abnormal gait, decreased activity tolerance, decreased balance, decreased knowledge of use of DME, decreased mobility, difficulty walking, decreased ROM, decreased strength, hypomobility, increased muscle spasms, impaired flexibility, and pain.   ACTIVITY LIMITATIONS: sitting, standing, squatting, sleeping, stairs, transfers, bed mobility, and locomotion level  PARTICIPATION LIMITATIONS: driving, shopping, community activity, yard work, and church  PERSONAL FACTORS: Age, Behavior pattern, Education, Fitness, Past/current experiences, Social background, and Time since onset of injury/illness/exacerbation are also affecting patient's functional outcome.   REHAB POTENTIAL: Good  CLINICAL DECISION MAKING: Stable/uncomplicated  EVALUATION COMPLEXITY: Low   GOALS: Goals reviewed with patient? No  SHORT TERM GOALS: Target date: 09/09/2023   Will be compliant with appropriate progressive HEP Goal status: on going 08/30/2023    2. L knee AROM flexion to be at least 95* and extension AROM to be no more than 5* Goal status: on going 08/30/2023    3. Will be independent with edema management strategies  Goal status: on going 08/30/2023    4. Gait pattern to have normalized with LRAD Goal status: on going 08/30/2023    LONG TERM GOALS: Target date: 10/07/2023    MMT to have improved by one grade all weak groups Goal status: initial   2. L Knee flexion AROM to be at least 110* Goal status: initial    3. Will be able to ascend and descend stairs reciprocally with no increase in pain Goal status: initial   4. Will be able to ambulate community distances and perform all household tasks with  no increase in pain  Goal status: initial   5. PSFS to have improved by at least 2 points to show improved QOL and subjective improvement  Goal status: initial    PLAN:  PT FREQUENCY: 2x/week  PT DURATION: 8 weeks  PLANNED INTERVENTIONS: 97750- Physical Performance Testing, 97110-Therapeutic exercises, 97530- Therapeutic activity, 97112- Neuromuscular re-education, 97535- Self Care, 02859- Manual therapy, 97116- Gait training, and 97016- Vasopneumatic device  PLAN FOR NEXT SESSION: Strength gains, balance improvements, SPC improvements. Flexion gains.     Ozell Silvan, PT, DPT, OCS, ATC 08/30/23  2:23 PM

## 2023-08-31 NOTE — Discharge Summary (Signed)
 Physician Discharge Summary      Patient ID: Patricia Hoover MRN: [id number] DOB/AGE: 02-12-1945 78 y.o.  Admit date: 07/26/2023 Discharge date: 07/28/2023  Admission Diagnoses:  Principal Problem:   Arthritis of left knee   Discharge Diagnoses:  Same  Surgeries: Procedure(s): ARTHROPLASTY, KNEE, TOTAL, LEFT on 07/26/2023   Consultants:   Discharged Condition: Stable  Hospital Course: Patricia Hoover is an 78 y.o. female who was admitted 07/26/2023 with a chief complaint of left knee pain, and found to have a diagnosis of left knee arthritis.  They were brought to the operating room on 07/26/2023 and underwent the above named procedures.  Pt awoke from anesthesia without complication and was transferred to the floor. On POD1, she was able to ambulate about 140 feet but she had a fair amount of pain/nausea.  This improved by POD 2 and she was comfortable with discharge home.  Discharge home on POD 2 without any red flag signs or symptoms throughout her stay..  Pt will f/u with Dr. Addie in clinic in ~2 weeks.   Antibiotics given:  Anti-infectives (From admission, onward)    Start     Dose/Rate Route Frequency Ordered Stop   07/26/23 1500  ceFAZolin  (ANCEF ) IVPB 2g/100 mL premix        2 g 200 mL/hr over 30 Minutes Intravenous Every 6 hours 07/26/23 1306 07/27/23 1234   07/26/23 0827  vancomycin  (VANCOCIN ) powder  Status:  Discontinued          As needed 07/26/23 0827 07/26/23 1043   07/26/23 0600  ceFAZolin  (ANCEF ) IVPB 2g/100 mL premix        2 g 200 mL/hr over 30 Minutes Intravenous On call to O.R. 07/26/23 9453 07/26/23 9178     .  Recent vital signs:  Vitals:   07/28/23 0324 07/28/23 0732  BP: (!) 152/68 130/62  Pulse: 72 99  Resp: 18 16  Temp: 98.6 F (37 C) 99.3 F (37.4 C)  SpO2: 95% 96%    Recent laboratory studies:  Results for orders placed or performed during the hospital encounter of 07/26/23  Basic metabolic panel   Collection Time: 07/27/23  8:44 AM  Result Value  Ref Range   Sodium 137 135 - 145 mmol/L   Potassium 3.0 (L) 3.5 - 5.1 mmol/L   Chloride 97 (L) 98 - 111 mmol/L   CO2 28 22 - 32 mmol/L   Glucose, Bld 183 (H) 70 - 99 mg/dL   BUN 13 8 - 23 mg/dL   Creatinine, Ser 9.34 0.44 - 1.00 mg/dL   Calcium  9.2 8.9 - 10.3 mg/dL   GFR, Estimated >39 >39 mL/min   Anion gap 12 5 - 15    Discharge Medications:   Allergies as of 07/28/2023       Reactions   Ace Inhibitors Swelling   Angiotensin Receptor Blockers    Would avoid, h/o swelling with ACE prev   Aspirin  Other (See Comments)   GI bleed   Bisphosphonates    GI side effects   Nsaids    GI bleed   Prolia [denosumab]    GI upset        Medication List     TAKE these medications    amLODipine  2.5 MG tablet Commonly known as: NORVASC  Take 1 tablet (2.5 mg total) by mouth daily.   aspirin  81 MG chewable tablet Chew 1 tablet (81 mg total) by mouth daily.   Fusion Plus Caps Take 1 capsule by mouth every other day.  indapamide  2.5 MG tablet Commonly known as: LOZOL  Take 1 tablet (2.5 mg total) by mouth daily.   ondansetron  4 MG tablet Commonly known as: ZOFRAN  Take 1 tablet (4 mg total) by mouth every 6 (six) hours as needed for nausea.   polyethylene glycol powder 17 GM/SCOOP powder Commonly known as: GLYCOLAX/MIRALAX Take 17 g by mouth daily as needed for mild constipation.   Potassium Citrate  15 MEQ (1620 MG) Tbcr Take 1 tablet by mouth in the morning and at bedtime.   simvastatin  10 MG tablet Commonly known as: ZOCOR  Take 1 tablet (10 mg total) by mouth every other day.   traMADol  50 MG tablet Commonly known as: ULTRAM  Take 1 tablet (50 mg total) by mouth every 6 (six) hours as needed.   VITAMIN C GUMMIE PO Take 2 each by mouth daily.   Vitamin D3 50 MCG (2000 UT) capsule Take 2 capsules (4,000 Units total) by mouth daily.        Diagnostic Studies: XR Knee 1-2 Views Left Result Date: 08/12/2023 AP and lateral views of the knee reviewed.  Total knee  arthroplasty prosthesis in good position and alignment without any complicating features.  There is no evidence of dislocation, periprosthetic fracture, patella baja/alta    Disposition: Discharge disposition: 01-Home or Self Care       Discharge Instructions     Call MD / Call 911   Complete by: As directed    If you experience chest pain or shortness of breath, CALL 911 and be transported to the hospital emergency room.  If you develope a fever above 101 F, pus (white drainage) or increased drainage or redness at the wound, or calf pain, call your surgeon's office.   Call MD / Call 911   Complete by: As directed    If you experience chest pain or shortness of breath, CALL 911 and be transported to the hospital emergency room.  If you develope a fever above 101 F, pus (white drainage) or increased drainage or redness at the wound, or calf pain, call your surgeon's office.   Constipation Prevention   Complete by: As directed    Drink plenty of fluids.  Prune juice may be helpful.  You may use a stool softener, such as Colace (over the counter) 100 mg twice a day.  Use MiraLax (over the counter) for constipation as needed.   Constipation Prevention   Complete by: As directed    Drink plenty of fluids.  Prune juice may be helpful.  You may use a stool softener, such as Colace (over the counter) 100 mg twice a day.  Use MiraLax (over the counter) for constipation as needed.   Diet - low sodium heart healthy   Complete by: As directed    Diet - low sodium heart healthy   Complete by: As directed    Discharge instructions   Complete by: As directed    .1.  Use CPM machine 1 hour 3 times a day increasing the degrees daily 2.  Okay to shower dressing is waterproof 3.  Weightbearing as tolerated with walker 4.  Tylenol  and ibuprofen  for pain.  Also can use tramadol  as needed.  Take 1 baby aspirin  a day for 3 weeks to prevent blood clots.   Increase activity slowly as tolerated   Complete  by: As directed    Increase activity slowly as tolerated   Complete by: As directed    Post-operative opioid taper instructions:   Complete by: As directed  POST-OPERATIVE OPIOID TAPER INSTRUCTIONS: It is important to wean off of your opioid medication as soon as possible. If you do not need pain medication after your surgery it is ok to stop day one. Opioids include: Codeine, Hydrocodone (Norco, Vicodin), Oxycodone (Percocet, oxycontin ) and hydromorphone amongst others.  Long term and even short term use of opiods can cause: Increased pain response Dependence Constipation Depression Respiratory depression And more.  Withdrawal symptoms can include Flu like symptoms Nausea, vomiting And more Techniques to manage these symptoms Hydrate well Eat regular healthy meals Stay active Use relaxation techniques(deep breathing, meditating, yoga) Do Not substitute Alcohol to help with tapering If you have been on opioids for less than two weeks and do not have pain than it is ok to stop all together.  Plan to wean off of opioids This plan should start within one week post op of your joint replacement. Maintain the same interval or time between taking each dose and first decrease the dose.  Cut the total daily intake of opioids by one tablet each day Next start to increase the time between doses. The last dose that should be eliminated is the evening dose.      Post-operative opioid taper instructions:   Complete by: As directed    POST-OPERATIVE OPIOID TAPER INSTRUCTIONS: It is important to wean off of your opioid medication as soon as possible. If you do not need pain medication after your surgery it is ok to stop day one. Opioids include: Codeine, Hydrocodone (Norco, Vicodin), Oxycodone (Percocet, oxycontin ) and hydromorphone amongst others.  Long term and even short term use of opiods can cause: Increased pain response Dependence Constipation Depression Respiratory  depression And more.  Withdrawal symptoms can include Flu like symptoms Nausea, vomiting And more Techniques to manage these symptoms Hydrate well Eat regular healthy meals Stay active Use relaxation techniques(deep breathing, meditating, yoga) Do Not substitute Alcohol to help with tapering If you have been on opioids for less than two weeks and do not have pain than it is ok to stop all together.  Plan to wean off of opioids This plan should start within one week post op of your joint replacement. Maintain the same interval or time between taking each dose and first decrease the dose.  Cut the total daily intake of opioids by one tablet each day Next start to increase the time between doses. The last dose that should be eliminated is the evening dose.           Follow-up Information     [facility] Follow up.   Why: [facility] will contact you for the first home visit Contact information: [phone]                 Signed: Herlene Calix 08/31/2023, 11:54 AM

## 2023-09-01 ENCOUNTER — Ambulatory Visit (INDEPENDENT_AMBULATORY_CARE_PROVIDER_SITE_OTHER): Admitting: Rehabilitative and Restorative Service Providers"

## 2023-09-01 ENCOUNTER — Encounter: Payer: Self-pay | Admitting: Rehabilitative and Restorative Service Providers"

## 2023-09-01 DIAGNOSIS — M6281 Muscle weakness (generalized): Secondary | ICD-10-CM

## 2023-09-01 DIAGNOSIS — R2681 Unsteadiness on feet: Secondary | ICD-10-CM

## 2023-09-01 DIAGNOSIS — M25562 Pain in left knee: Secondary | ICD-10-CM

## 2023-09-01 DIAGNOSIS — G8929 Other chronic pain: Secondary | ICD-10-CM

## 2023-09-01 DIAGNOSIS — R262 Difficulty in walking, not elsewhere classified: Secondary | ICD-10-CM

## 2023-09-01 DIAGNOSIS — R600 Localized edema: Secondary | ICD-10-CM

## 2023-09-01 DIAGNOSIS — R2689 Other abnormalities of gait and mobility: Secondary | ICD-10-CM | POA: Diagnosis not present

## 2023-09-01 NOTE — Therapy (Signed)
 OUTPATIENT PHYSICAL THERAPY TREATMENT   Patient Name: Patricia Hoover MRN: [id number] DOB:01-25-1945, 78 y.o., female Today's Date: 09/01/2023  END OF SESSION:  PT End of Session - 09/01/23 1329     Visit Number 5    Number of Visits 17    Date for PT Re-Evaluation 10/07/23    Authorization Type MCR    Authorization Time Period 08/12/23 to 10/07/23    Progress Note Due on Visit 10    PT Start Time 1330    PT Stop Time 1424    PT Time Calculation (min) 54 min    Activity Tolerance Patient limited by pain    Behavior During Therapy [facility] for tasks assessed/performed              Past Medical History:  Diagnosis Date   Angioedema 06/15/2017   Arrhythmia    possible hx, resolved prev   Blood transfusion without reported diagnosis 1972   had reaction; had to stop   Carpal tunnel syndrome    Cataract 2019   resolved with surgery   Complication of anesthesia    takes a long to wake up   Duodenal ulcer    H/O exercise stress test 2012   normal   Heart murmur    as child   Hematochezia    High cholesterol    History of kidney stones    Hx of colonic polyps    Hypertension    Osteoporosis 2010   t score - 3.9   Peptic ulcer of duodenum    Renal stones    Vitamin D  deficiency 03/01/2021   Past Surgical History:  Procedure Laterality Date   ABDOMINAL HYSTERECTOMY  1972   for endometriosis   CATARACT EXTRACTION W/ INTRAOCULAR LENS IMPLANT Right 06/2017   CATARACT EXTRACTION W/ INTRAOCULAR LENS IMPLANT Left 07/2017   COLONOSCOPY Left 03/02/2017   Procedure: COLONOSCOPY;  Surgeon: Janalyn Keene NOVAK, MD;  Location: [facility] ENDOSCOPY;  Service: Endoscopy;  Laterality: Left;   COLONOSCOPY WITH PROPOFOL  N/A 05/04/2016   Procedure: COLONOSCOPY WITH PROPOFOL ;  Surgeon: Ruel Kung, MD;  Location: [facility] ENDOSCOPY;  Service: Endoscopy;  Laterality: N/A;   ESOPHAGOGASTRODUODENOSCOPY Left 03/02/2017   Procedure: ESOPHAGOGASTRODUODENOSCOPY (EGD);  Surgeon: Janalyn Keene NOVAK, MD;   Location: [facility] ENDOSCOPY;  Service: Endoscopy;  Laterality: Left;   ESOPHAGOGASTRODUODENOSCOPY (EGD) WITH PROPOFOL  N/A 04/08/2017   Procedure: ESOPHAGOGASTRODUODENOSCOPY (EGD) WITH PROPOFOL ;  Surgeon: Janalyn Keene NOVAK, MD;  Location: [facility] ENDOSCOPY;  Service: Endoscopy;  Laterality: N/A;   LITHOTRIPSY     for kidney stone x5   LITHOTRIPSY  09/2017   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   Procedure: ARTHROPLASTY, KNEE, TOTAL, LEFT;  Surgeon: Addie Cordella Hamilton, MD;  Location: [facility] OR;  Service: Orthopedics;  Laterality: Left;   Patient Active Problem List   Diagnosis Date Noted   Arthritis of left knee 07/26/2023   Lower GI bleeding 06/29/2023   Hypokalemia 03/09/2023   Aortic atherosclerosis (HCC) 03/09/2023   Unilateral primary osteoarthritis, left knee 11/26/2022   Dysuria 04/21/2022   Stiff-legged gait 04/21/2022   Scalp lesion 09/21/2021   Clavicle enlargement 04/05/2021   Hyperpigmentation 03/01/2021   Vitamin D  deficiency 03/01/2021   Leg pain 05/21/2019   Right leg pain 05/21/2019   GI bleed 03/04/2018   Healthcare maintenance 01/08/2018   Paresthesia 01/08/2018   Neck pain 08/31/2017   Angioedema 06/15/2017   External hemorrhoids    Diverticulosis of large intestine without diverticulitis    Internal hemorrhoids    Advance care planning  12/23/2016   Renal stone 04/20/2016   Heartburn 12/17/2015   Medicare annual wellness visit, subsequent 06/05/2013   Hyperglycemia 06/05/2013   Knee pain 08/31/2012   Age-related osteoporosis without current pathological fracture 08/18/2012   HTN (hypertension) 08/09/2012   HLD (hyperlipidemia) 08/09/2012    PCP: Cleatus Molly MD   REFERRING PROVIDER: Addie Cordella Hamilton, MD  REFERRING DIAG: Diagnosis [phone] (ICD-10-CM) - Chronic pain of left knee M17.12 (ICD-10-CM) - Unilateral primary osteoarthritis, left knee  THERAPY DIAG:  Difficulty in walking, not elsewhere classified  Muscle weakness  (generalized)  Other abnormalities of gait and mobility  Localized edema  Chronic pain of left knee  Unsteadiness on feet  Rationale for Evaluation and Treatment: Rehabilitation  ONSET DATE: Lt TKR 07/26/23  SUBJECTIVE:   SUBJECTIVE STATEMENT: Pt indicated yesterday she was moving to position for exercise and felt pain and knee got tight rest of day.  Reported more soreness.  Reported some movement helped.   PERTINENT HISTORY: See above   PAIN:   NPRS scale: 5/10 upon arrival.  Pain location: Lt knee  Pain description:  stiffness, tightness.  Aggravating factors: bending it  Relieving factors: nothing besides pain pills   PRECAUTIONS: None  RED FLAGS: None   WEIGHT BEARING RESTRICTIONS: No  FALLS:  Has patient fallen in last 6 months? 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  PATIENT SURVEYS:  PSFS: THE PATIENT SPECIFIC FUNCTIONAL SCALE  Place score of 0-10 (0 = unable to perform activity and 10 = able to perform activity at the same level as before injury or problem)  Activity Date: 08/12/23     Bending knee  5    2. Lifting leg  5    3.     4.      Total Score 5      Total Score = Sum of activity scores/number of activities  Minimally Detectable Change: 3 points (for single activity); 2 points (for average score)   COGNITION: 08/12/2023 Overall cognitive status: Within functional limits for tasks assessed     SENSATION: 08/12/2023 [facility]  EDEMA:  08/12/2023 Localized edema  appropriate for post-op state    LOWER EXTREMITY ROM:  Active ROM Right eval Left eval Left  08/22/2023 Left  08/24/2023 Left 09/01/2023  Hip flexion       Hip extension       Hip abduction       Hip adduction       Hip internal rotation       Hip external rotation       Knee flexion  67 64* 73* 62 in supine AROM heel slide  721 in PROM heel slide  Knee extension  -9 -5* -5* -8 in seated AROM LAQ   Ankle dorsiflexion       Ankle plantarflexion       Ankle inversion       Ankle eversion        (Blank rows = not tested)  LOWER EXTREMITY MMT:  MMT Right eval Left Eval 08/12/2023  Hip  flexion    Hip extension    Hip abduction    Hip adduction    Hip internal rotation    Hip external rotation    Knee flexion  3+  Knee extension  3+  Ankle dorsiflexion    Ankle plantarflexion    Ankle inversion    Ankle eversion     (Blank rows = not tested)    GAIT: 08/30/2023: Ambulation in clinic c SPC in Rt UE with SBA.  Reduced knee extension in stance, maintaining knee flexion.   08/12/2023 Distance walked: in clinic distances  Assistive device utilized: Environmental consultant - 2 wheeled Level of assistance: Modified independence Comments: antalgic, limited knee ROM during gait cycle                  TREATMENT       DATE: 09/01/2023 Therex:  Nustep Lvl 5 for ROM gains with UE assist 8 mins  Incilne gastroc stretch 30 sec x 4 Seated Lt knee LAQ 2 x 15 with end range pauses with Rt leg overpressure into flexion for a few seconds Supine heel slide AROM 5 sec hold x 5  Supine Lt leg LAQ with flexion stretch 5 sec hold x 10 in 90 deg hip flexion.   Neuro Re-ed (balance improvements, muscle activitation )  Tandem stance 1 min x 1 bilateral with occasional HHA on bars  Retro step x 15 bilateral with SBA to occasional HHA on bar Standing church pew anterior/posterior weight shifts ankle strategy , involuntary quad activation: 1.5 mins   Manual Seated Lt knee flexion c distraction/IR  mobilization c movements.  Contralateral leg movement opposite.   Vaso 10 mins Lt leg in elevation medium compression 34 degrees  TREATMENT       DATE: 08/30/2023 Therex:  Nustep Lvl 5 for ROM of knee UE/LE 8 mins, movement to tolerance for good stretch.  Seated Lt knee flexion hamstring curl green band x 15 Seated Lt knee LAQ with 2.5 lb weight with end range pauses and contralateral leg movement opposite 2 x 15  Incline board gastroc stretch 30 sec x 3 bilateral   Gait Training SPC use in clinic household distances < 150 ft several times with cane in Rt UE.  Cues for sequencing and placement give at times in activity.  SBA required.   Neuro Re-ed Tandem stance 1 min x 2 bilateral with occasional HHA on bar Feet closer alternating heel/toe lifts x 15 with occasional HHA on bar   Manual Seated Lt knee flexion c distraction/IR mobilization c movements.  Contralateral leg movement opposite.   Vaso 10 mins Lt leg in elevation medium compression 34 degrees                                                                                                                              TREATMENT       DATE: 08/24/2023 Recumbent bike Seat 4 for 5 minutes, then 5 full  revolutions forward and back (PT assist) Quadriceps sets with heel prop 2 sets of 10 for 5 seconds Seated knee flexion AAROM (right pushes left into flexion) 10 x 10 seconds Tailgate knee flexion 1 minute (focus on pulling into flexion)  Functional Activities: Double Leg Press 75 pounds 15 reps full extension, stretch into flexion and slow eccentric contraction (PT assist with flexion) Single leg press 31 pounds 10 reps bilaterally full extension, stretch into flexion and slow eccentric contraction  Vaso left knee 10 minutes Medium pressure 34 degrees   TREATMENT       DATE:08/22/2023 Recumbent bike Seat 4 for 5 minutes Quadriceps sets with heel prop 2 sets of 10 for 5 seconds Seated knee flexion AAROM (right pushes left into  flexion) 10 x 10 seconds  Functional Activities: Double Leg Press 75 pounds 15 reps full extension, stretch into flexion and slow eccentric contraction Single leg press 37 pounds 10 reps bilaterally full extension, stretch into flexion and slow eccentric contraction  Vaso left knee 10 minutes Medium pressure 34 degrees     PATIENT EDUCATION:  Education details: exam findings, POC, course of care following TKR  Person educated: Patient Education method: Explanation and Demonstration Education comprehension: verbalized understanding, returned demonstration, and needs further education  HOME EXERCISE PROGRAM:  Access Code: [email or web address] URL: [email or web address] Date: 08/22/2023 Prepared by: Lamar Ivory  Exercises - Seated Knee Flexion AAROM  - 5 x daily - 7 x weekly - 1 sets - 10 reps - 10 seconds hold - Supine Quadricep Sets  - 5 x daily - 7 x weekly - 2 sets - 10 reps - 5 second hold - Supine Heel Slide with Strap  - 5 x daily - 7 x weekly - 1 sets - 10 reps - 10 seconds hold  ASSESSMENT:  CLINICAL IMPRESSION: Arrival today with more noted stiffness/guarding, assumable to incident of movement pain noted yesterday.  Movement in clinic was able to help reduce symptoms at rest and make some gains in range tolerated while in clinic today.  Recheck of supine heel slide AROM still showed noted restriction in flexion mobility .  Continued emphasis on mobility gains for flexion and extension noted at this time.   OBJECTIVE IMPAIRMENTS: Abnormal gait, decreased activity tolerance, decreased balance, decreased knowledge of use of DME, decreased mobility, difficulty walking, decreased ROM, decreased strength, hypomobility, increased muscle spasms, impaired flexibility, and pain.   ACTIVITY LIMITATIONS: sitting, standing, squatting, sleeping, stairs, transfers, bed mobility, and locomotion level  PARTICIPATION LIMITATIONS: driving, shopping, community activity, yard work, and  church  PERSONAL FACTORS: Age, Behavior pattern, Education, Fitness, Past/current experiences, Social background, and Time since onset of injury/illness/exacerbation are also affecting patient's functional outcome.   REHAB POTENTIAL: Good  CLINICAL DECISION MAKING: Stable/uncomplicated  EVALUATION COMPLEXITY: Low   GOALS: Goals reviewed with patient? No  SHORT TERM GOALS: Target date: 09/09/2023   Will be compliant with appropriate progressive HEP Goal status: on going 08/30/2023    2. L knee AROM flexion to be at least 95* and extension AROM to be no more than 5* Goal status: on going 08/30/2023    3. Will be independent with edema management strategies  Goal status: on going 08/30/2023    4. Gait pattern to have normalized with LRAD Goal status: on going 08/30/2023    LONG TERM GOALS: Target date: 10/07/2023    MMT to have improved by one grade all weak groups Goal status: initial   2. L Knee flexion AROM to be at least  110* Goal status: initial    3. Will be able to ascend and descend stairs reciprocally with no increase in pain Goal status: initial   4. Will be able to ambulate community distances and perform all household tasks with no increase in pain  Goal status: initial   5. PSFS to have improved by at least 2 points to show improved QOL and subjective improvement  Goal status: initial    PLAN:  PT FREQUENCY: 2x/week  PT DURATION: 8 weeks  PLANNED INTERVENTIONS: 97750- Physical Performance Testing, 97110-Therapeutic exercises, 97530- Therapeutic activity, [id number]- Neuromuscular re-education, 97535- Self Care, [id number]- Manual therapy, 97116- Gait training, and 97016- Vasopneumatic device  PLAN FOR NEXT SESSION: Need to progress extension and flexion mobility as able.     Ozell Silvan, PT, DPT, OCS, ATC 09/01/23  2:16 PM

## 2023-09-06 ENCOUNTER — Encounter: Payer: Self-pay | Admitting: Rehabilitative and Restorative Service Providers"

## 2023-09-06 ENCOUNTER — Ambulatory Visit (INDEPENDENT_AMBULATORY_CARE_PROVIDER_SITE_OTHER): Admitting: Rehabilitative and Restorative Service Providers"

## 2023-09-06 DIAGNOSIS — M6281 Muscle weakness (generalized): Secondary | ICD-10-CM

## 2023-09-06 DIAGNOSIS — R2681 Unsteadiness on feet: Secondary | ICD-10-CM

## 2023-09-06 DIAGNOSIS — R600 Localized edema: Secondary | ICD-10-CM | POA: Diagnosis not present

## 2023-09-06 DIAGNOSIS — R2689 Other abnormalities of gait and mobility: Secondary | ICD-10-CM | POA: Diagnosis not present

## 2023-09-06 DIAGNOSIS — M25562 Pain in left knee: Secondary | ICD-10-CM

## 2023-09-06 DIAGNOSIS — G8929 Other chronic pain: Secondary | ICD-10-CM

## 2023-09-06 DIAGNOSIS — R262 Difficulty in walking, not elsewhere classified: Secondary | ICD-10-CM

## 2023-09-06 NOTE — Therapy (Signed)
 OUTPATIENT PHYSICAL THERAPY TREATMENT   Patient Name: Patricia Hoover MRN: [id number] DOB:1945-04-25, 78 y.o., female Today's Date: 09/06/2023  END OF SESSION:  PT End of Session - 09/06/23 1343     Visit Number 6    Number of Visits 17    Date for PT Re-Evaluation 10/07/23    Authorization Type MCR    Authorization Time Period 08/12/23 to 10/07/23    Progress Note Due on Visit 10    PT Start Time 1343    PT Stop Time 1432    PT Time Calculation (min) 49 min    Activity Tolerance Patient tolerated treatment well    Behavior During Therapy [facility] for tasks assessed/performed               Past Medical History:  Diagnosis Date   Angioedema 06/15/2017   Arrhythmia    possible hx, resolved prev   Blood transfusion without reported diagnosis 1972   had reaction; had to stop   Carpal tunnel syndrome    Cataract 2019   resolved with surgery   Complication of anesthesia    takes a long to wake up   Duodenal ulcer    H/O exercise stress test 2012   normal   Heart murmur    as child   Hematochezia    High cholesterol    History of kidney stones    Hx of colonic polyps    Hypertension    Osteoporosis 2010   t score - 3.9   Peptic ulcer of duodenum    Renal stones    Vitamin D  deficiency 03/01/2021   Past Surgical History:  Procedure Laterality Date   ABDOMINAL HYSTERECTOMY  1972   for endometriosis   CATARACT EXTRACTION W/ INTRAOCULAR LENS IMPLANT Right 06/2017   CATARACT EXTRACTION W/ INTRAOCULAR LENS IMPLANT Left 07/2017   COLONOSCOPY Left 03/02/2017   Procedure: COLONOSCOPY;  Surgeon: Janalyn Keene NOVAK, MD;  Location: [facility] ENDOSCOPY;  Service: Endoscopy;  Laterality: Left;   COLONOSCOPY WITH PROPOFOL  N/A 05/04/2016   Procedure: COLONOSCOPY WITH PROPOFOL ;  Surgeon: Ruel Kung, MD;  Location: [facility] ENDOSCOPY;  Service: Endoscopy;  Laterality: N/A;   ESOPHAGOGASTRODUODENOSCOPY Left 03/02/2017   Procedure: ESOPHAGOGASTRODUODENOSCOPY (EGD);  Surgeon: Janalyn Keene NOVAK, MD;  Location: [facility] ENDOSCOPY;  Service: Endoscopy;  Laterality: Left;   ESOPHAGOGASTRODUODENOSCOPY (EGD) WITH PROPOFOL  N/A 04/08/2017   Procedure: ESOPHAGOGASTRODUODENOSCOPY (EGD) WITH PROPOFOL ;  Surgeon: Janalyn Keene NOVAK, MD;  Location: [facility] ENDOSCOPY;  Service: Endoscopy;  Laterality: N/A;   LITHOTRIPSY     for kidney stone x5   LITHOTRIPSY  09/2017   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   Procedure: ARTHROPLASTY, KNEE, TOTAL, LEFT;  Surgeon: Addie Cordella Hamilton, MD;  Location: [facility] OR;  Service: Orthopedics;  Laterality: Left;   Patient Active Problem List   Diagnosis Date Noted   Arthritis of left knee 07/26/2023   Lower GI bleeding 06/29/2023   Hypokalemia 03/09/2023   Aortic atherosclerosis (HCC) 03/09/2023   Unilateral primary osteoarthritis, left knee 11/26/2022   Dysuria 04/21/2022   Stiff-legged gait 04/21/2022   Scalp lesion 09/21/2021   Clavicle enlargement 04/05/2021   Hyperpigmentation 03/01/2021   Vitamin D  deficiency 03/01/2021   Leg pain 05/21/2019   Right leg pain 05/21/2019   GI bleed 03/04/2018   Healthcare maintenance 01/08/2018   Paresthesia 01/08/2018   Neck pain 08/31/2017   Angioedema 06/15/2017   External hemorrhoids    Diverticulosis of large intestine without diverticulitis    Internal hemorrhoids    Advance care  planning 12/23/2016   Renal stone 04/20/2016   Heartburn 12/17/2015   Medicare annual wellness visit, subsequent 06/05/2013   Hyperglycemia 06/05/2013   Knee pain 08/31/2012   Age-related osteoporosis without current pathological fracture 08/18/2012   HTN (hypertension) 08/09/2012   HLD (hyperlipidemia) 08/09/2012    PCP: Cleatus Molly MD   REFERRING PROVIDER: Addie Cordella Hamilton, MD  REFERRING DIAG: Diagnosis [phone] (ICD-10-CM) - Chronic pain of left knee M17.12 (ICD-10-CM) - Unilateral primary osteoarthritis, left knee  THERAPY DIAG:  Difficulty in walking, not elsewhere classified  Muscle weakness  (generalized)  Other abnormalities of gait and mobility  Localized edema  Chronic pain of left knee  Unsteadiness on feet  Rationale for Evaluation and Treatment: Rehabilitation  ONSET DATE: Lt TKR 07/26/23  SUBJECTIVE:   SUBJECTIVE STATEMENT: Pt indicated nighttime still seems the most trouble.   PERTINENT HISTORY: See above   PAIN:   NPRS scale: 5/10 upon arrival.  Pain location: Lt knee  Pain description:  stiffness, tightness.  Aggravating factors: bending it  Relieving factors: nothing besides pain pills   PRECAUTIONS: None  RED FLAGS: None   WEIGHT BEARING RESTRICTIONS: No  FALLS:  Has patient fallen in last 6 months? 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  PATIENT SURVEYS:  PSFS: THE PATIENT SPECIFIC FUNCTIONAL SCALE  Place score of 0-10 (0 = unable to perform activity and 10 = able to perform activity at the same level as before injury or problem)  Activity Date: 08/12/23     Bending knee  5    2. Lifting leg  5    3.     4.      Total Score 5      Total Score = Sum of activity scores/number of activities  Minimally Detectable Change: 3 points (for single activity); 2 points (for average score)   COGNITION: 08/12/2023 Overall cognitive status: Within functional limits for tasks assessed     SENSATION: 08/12/2023 [facility]  EDEMA:  08/12/2023 Localized edema appropriate for post-op state    LOWER EXTREMITY ROM:  Active ROM Right eval Left eval Left  08/22/2023 Left   08/24/2023 Left 09/01/2023  Hip flexion       Hip extension       Hip abduction       Hip adduction       Hip internal rotation       Hip external rotation       Knee flexion  67 64* 73* 62 in supine AROM heel slide  721 in PROM heel slide  Knee extension  -9 -5* -5* -8 in seated AROM LAQ   Ankle dorsiflexion       Ankle plantarflexion       Ankle inversion       Ankle eversion        (Blank rows = not tested)  LOWER EXTREMITY MMT:  MMT Right eval Left Eval 08/12/2023  Hip flexion    Hip extension    Hip abduction    Hip adduction    Hip  internal rotation    Hip external rotation    Knee flexion  3+  Knee extension  3+  Ankle dorsiflexion    Ankle plantarflexion    Ankle inversion    Ankle eversion     (Blank rows = not tested)    GAIT: 08/30/2023: Ambulation in clinic c SPC in Rt UE with SBA.  Reduced knee extension in stance, maintaining knee flexion.   08/12/2023 Distance walked: in clinic distances  Assistive device utilized: Environmental consultant - 2 wheeled Level of assistance: Modified independence Comments: antalgic, limited knee ROM during gait cycle                  TREATMENT       DATE: 09/06/2023 Therex:  Nustep Lvl 5 for ROM gains with UE assist 8 mins Seated Lt leg LAQ with end range pauses each direction with contralateral leg movement opposite x15 0 lbs, x 15 3 lbs  Supine AAROM with strap Lt heel slide 5 sec hold x 10   TherActivity (to improve transfers, ambulation, squatting, stairs) Leg press double leg 75 lbs in available knee flexion x 15 Leg press single leg 37 lbs x 15 in available range  Manual Seated Lt knee flexion c distraction/IR mobilization c movements.  Contralateral leg movement opposite.  Contract/relax for knee flexion gains.   Vaso 10 mins Lt knee in elevation 34 degrees medium compression   TREATMENT       DATE: 09/01/2023 Therex:  Nustep Lvl 5 for ROM gains with UE assist 8 mins  Incilne gastroc stretch 30 sec x 4 Seated Lt knee  LAQ 2 x 15 with end range pauses with Rt leg overpressure into flexion for a few seconds Supine heel slide AROM 5 sec hold x 5  Supine Lt leg LAQ with flexion stretch 5 sec hold x 10 in 90 deg hip flexion.   Neuro Re-ed (balance improvements, muscle activitation )  Tandem stance 1 min x 1 bilateral with occasional HHA on bars  Retro step x 15 bilateral with SBA to occasional HHA on bar Standing church pew anterior/posterior weight shifts ankle strategy , involuntary quad activation: 1.5 mins   Manual Seated Lt knee flexion c distraction/IR mobilization c movements.  Contralateral leg movement opposite.   Vaso 10 mins Lt leg in elevation medium compression 34 degrees  TREATMENT       DATE: 08/30/2023 Therex:  Nustep Lvl 5 for ROM of knee UE/LE 8 mins, movement to tolerance for good stretch.  Seated Lt knee flexion hamstring curl green band x 15 Seated Lt knee LAQ with 2.5 lb weight with end range pauses and contralateral leg movement opposite 2 x 15  Incline board gastroc stretch 30 sec x 3 bilateral   Gait Training SPC use in clinic household distances < 150 ft several times with cane in Rt UE.  Cues for sequencing and placement give at times in activity.  SBA required.   Neuro Re-ed Tandem stance 1 min x 2 bilateral with occasional HHA on bar Feet closer alternating heel/toe lifts x 15 with occasional HHA on bar   Manual Seated Lt knee flexion c distraction/IR mobilization c movements.  Contralateral leg movement opposite.   Vaso 10 mins Lt leg in elevation medium compression 34 degrees  TREATMENT       DATE: 08/24/2023 Recumbent bike Seat 4 for 5 minutes, then 5 full revolutions forward and back (PT assist) Quadriceps sets with heel prop 2 sets of 10 for 5 seconds Seated knee flexion AAROM (right pushes left into flexion) 10 x 10 seconds Tailgate knee  flexion 1 minute (focus on pulling into flexion)  Functional Activities: Double Leg Press 75 pounds 15 reps full extension, stretch into flexion and slow eccentric contraction (PT assist with flexion) Single leg press 31 pounds 10 reps bilaterally full extension, stretch into flexion and slow eccentric contraction  Vaso left knee 10 minutes Medium pressure 34 degrees   TREATMENT       DATE:08/22/2023 Recumbent bike Seat 4 for 5 minutes Quadriceps sets with heel prop 2 sets of 10 for 5 seconds Seated knee flexion AAROM (right pushes left into flexion) 10 x 10 seconds  Functional Activities: Double Leg Press 75 pounds 15 reps full extension, stretch into flexion and slow eccentric contraction Single leg press 37 pounds 10 reps bilaterally full extension, stretch into flexion and slow eccentric contraction  Vaso left knee 10 minutes Medium pressure 34 degrees     PATIENT EDUCATION:  Education details: exam findings, POC, course of care following TKR  Person educated: Patient Education method: Explanation and Demonstration Education comprehension: verbalized understanding, returned demonstration, and needs further education  HOME EXERCISE PROGRAM:  Access Code: [email or web address] URL: [email or web address] Date: 08/22/2023 Prepared by: Lamar Ivory  Exercises - Seated Knee Flexion AAROM  - 5 x daily - 7 x weekly - 1 sets - 10 reps - 10 seconds hold - Supine Quadricep Sets  - 5 x daily - 7 x weekly - 2 sets - 10 reps - 5 second hold - Supine Heel Slide with Strap  - 5 x daily - 7 x weekly - 1 sets - 10 reps - 10 seconds hold  ASSESSMENT:  CLINICAL IMPRESSION: SPC use into clinic and in clinic today.  Mobility deficits still evident in ambulation.   OBJECTIVE IMPAIRMENTS: Abnormal gait, decreased activity tolerance, decreased balance, decreased knowledge of use of DME, decreased mobility, difficulty walking, decreased ROM, decreased strength, hypomobility, increased muscle  spasms, impaired flexibility, and pain.   ACTIVITY LIMITATIONS: sitting, standing, squatting, sleeping, stairs, transfers, bed mobility, and locomotion level  PARTICIPATION LIMITATIONS: driving, shopping, community activity, yard work, and church  PERSONAL FACTORS: Age, Behavior pattern, Education, Fitness, Past/current experiences, Social background, and Time since onset of injury/illness/exacerbation are also affecting patient's functional outcome.   REHAB POTENTIAL: Good  CLINICAL DECISION MAKING: Stable/uncomplicated  EVALUATION COMPLEXITY: Low   GOALS: Goals reviewed with patient? No  SHORT TERM GOALS: Target date: 09/09/2023   Will be compliant with appropriate progressive HEP Goal status: on going 08/30/2023    2. L knee AROM flexion to be at least 95* and extension AROM to be no more than 5* Goal status: on going 08/30/2023    3. Will be independent with edema management strategies  Goal status: on going 08/30/2023    4. Gait pattern to have normalized with LRAD Goal status: on going 08/30/2023    LONG TERM GOALS: Target date: 10/07/2023    MMT to have improved by one grade all weak groups Goal status: initial   2. L Knee flexion AROM to be at least 110* Goal status: initial    3. Will be able to ascend and descend stairs reciprocally with no increase in pain Goal status: initial   4. Will be able to  ambulate community distances and perform all household tasks with no increase in pain  Goal status: initial   5. PSFS to have improved by at least 2 points to show improved QOL and subjective improvement  Goal status: initial    PLAN:  PT FREQUENCY: 2x/week  PT DURATION: 8 weeks  PLANNED INTERVENTIONS: 97750- Physical Performance Testing, 97110-Therapeutic exercises, 97530- Therapeutic activity, [id number]- Neuromuscular re-education, 97535- Self Care, [id number]- Manual therapy, [id number]- Gait training, and [id number]- Vasopneumatic device  PLAN FOR NEXT SESSION:  Mobility gains, strengthening as able.     Ozell Silvan, PT, DPT, OCS, ATC 09/06/23  2:19 PM

## 2023-09-07 NOTE — Therapy (Signed)
 OUTPATIENT PHYSICAL THERAPY TREATMENT   Patient Name: Patricia Hoover MRN: [id number] DOB:02/09/1945, 78 y.o., female Today's Date: 09/08/2023  END OF SESSION:  PT End of Session - 09/08/23 1301     Visit Number 7    Number of Visits 17    Date for PT Re-Evaluation 10/07/23    Authorization Type MCR    Authorization Time Period 08/12/23 to 10/07/23    Progress Note Due on Visit 10    PT Start Time 1301    PT Stop Time 1349    PT Time Calculation (min) 48 min    Activity Tolerance Patient tolerated treatment well    Behavior During Therapy [facility] for tasks assessed/performed              Past Medical History:  Diagnosis Date   Angioedema 06/15/2017   Arrhythmia    possible hx, resolved prev   Blood transfusion without reported diagnosis 1972   had reaction; had to stop   Carpal tunnel syndrome    Cataract 2019   resolved with surgery   Complication of anesthesia    takes a long to wake up   Duodenal ulcer    H/O exercise stress test 2012   normal   Heart murmur    as child   Hematochezia    High cholesterol    History of kidney stones    Hx of colonic polyps    Hypertension    Osteoporosis 2010   t score - 3.9   Peptic ulcer of duodenum    Renal stones    Vitamin D  deficiency 03/01/2021   Past Surgical History:  Procedure Laterality Date   ABDOMINAL HYSTERECTOMY  1972   for endometriosis   CATARACT EXTRACTION W/ INTRAOCULAR LENS IMPLANT Right 06/2017   CATARACT EXTRACTION W/ INTRAOCULAR LENS IMPLANT Left 07/2017   COLONOSCOPY Left 03/02/2017   Procedure: COLONOSCOPY;  Surgeon: Janalyn Keene NOVAK, MD;  Location: [facility] ENDOSCOPY;  Service: Endoscopy;  Laterality: Left;   COLONOSCOPY WITH PROPOFOL  N/A 05/04/2016   Procedure: COLONOSCOPY WITH PROPOFOL ;  Surgeon: Ruel Kung, MD;  Location: [facility] ENDOSCOPY;  Service: Endoscopy;  Laterality: N/A;   ESOPHAGOGASTRODUODENOSCOPY Left 03/02/2017   Procedure: ESOPHAGOGASTRODUODENOSCOPY (EGD);  Surgeon: Janalyn Keene NOVAK, MD;  Location: [facility] ENDOSCOPY;  Service: Endoscopy;  Laterality: Left;   ESOPHAGOGASTRODUODENOSCOPY (EGD) WITH PROPOFOL  N/A 04/08/2017   Procedure: ESOPHAGOGASTRODUODENOSCOPY (EGD) WITH PROPOFOL ;  Surgeon: Janalyn Keene NOVAK, MD;  Location: [facility] ENDOSCOPY;  Service: Endoscopy;  Laterality: N/A;   LITHOTRIPSY     for kidney stone x5   LITHOTRIPSY  09/2017   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   Procedure: ARTHROPLASTY, KNEE, TOTAL, LEFT;  Surgeon: Addie Cordella Hamilton, MD;  Location[facility];  Service: Orthopedics;  Laterality: Left;   Patient Active Problem List   Diagnosis Date Noted   Arthritis of left knee 07/26/2023   Lower GI bleeding 06/29/2023   Hypokalemia 03/09/2023   Aortic atherosclerosis (HCC) 03/09/2023   Unilateral primary osteoarthritis, left knee 11/26/2022   Dysuria 04/21/2022   Stiff-legged gait 04/21/2022   Scalp lesion 09/21/2021   Clavicle enlargement 04/05/2021   Hyperpigmentation 03/01/2021   Vitamin D  deficiency 03/01/2021   Leg pain 05/21/2019   Right leg pain 05/21/2019   GI bleed 03/04/2018   Healthcare maintenance 01/08/2018   Paresthesia 01/08/2018   Neck pain 08/31/2017   Angioedema 06/15/2017   External hemorrhoids    Diverticulosis of large intestine without diverticulitis    Internal hemorrhoids    Advance care planning  12/23/2016   Renal stone 04/20/2016   Heartburn 12/17/2015   Medicare annual wellness visit, subsequent 06/05/2013   Hyperglycemia 06/05/2013   Knee pain 08/31/2012   Age-related osteoporosis without current pathological fracture 08/18/2012   HTN (hypertension) 08/09/2012   HLD (hyperlipidemia) 08/09/2012    PCP: Cleatus Molly MD   REFERRING PROVIDER: Addie Cordella Hamilton, MD  REFERRING DIAG: Diagnosis [phone] (ICD-10-CM) - Chronic pain of left knee M17.12 (ICD-10-CM) - Unilateral primary osteoarthritis, left knee  THERAPY DIAG:  Chronic pain of left knee  Difficulty in walking, not elsewhere  classified  Muscle weakness (generalized)  Other abnormalities of gait and mobility  Localized edema  Unsteadiness on feet  Rationale for Evaluation and Treatment: Rehabilitation  ONSET DATE: Lt TKR 07/26/23  SUBJECTIVE:   SUBJECTIVE STATEMENT: Patient noting increased muscle soreness and knee discomfort following leg press last session. Pain rated at 5/10 with exercise.   PERTINENT HISTORY: See above   PAIN:   NPRS scale: 5/10 upon arrival.  Pain location: Lt knee  Pain description:  stiffness, tightness.  Aggravating factors: bending it  Relieving factors: nothing besides pain pills   PRECAUTIONS: None  RED FLAGS: None   WEIGHT BEARING RESTRICTIONS: No  FALLS:  Has patient fallen in last 6 months? 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  PATIENT SURVEYS:  PSFS: THE PATIENT SPECIFIC FUNCTIONAL SCALE  Place score of 0-10 (0 = unable to perform activity and 10 = able to perform activity at the same level as before injury or problem)  Activity Date: 08/12/23     Bending knee  5    2. Lifting leg  5    3.     4.      Total Score 5      Total Score = Sum of activity scores/number of activities  Minimally Detectable Change: 3 points (for single activity); 2 points (for average score)   COGNITION: 08/12/2023 Overall cognitive status: Within functional limits for tasks assessed     SENSATION: 08/12/2023 [facility]  EDEMA:  08/12/2023 Localized edema appropriate for post-op state    LOWER  EXTREMITY ROM:  Active ROM Right eval Left eval Left  08/22/2023 Left  08/24/2023 Left 09/01/2023  Hip flexion       Hip extension       Hip abduction       Hip adduction       Hip internal rotation       Hip external rotation       Knee flexion  67 64* 73* 62 in supine AROM heel slide  721 in PROM heel slide  Knee extension  -9 -5* -5* -8 in seated AROM LAQ   Ankle dorsiflexion       Ankle plantarflexion       Ankle inversion       Ankle eversion        (Blank rows = not tested)  LOWER EXTREMITY MMT:  MMT Right eval Left Eval 08/12/2023  Hip flexion    Hip extension    Hip  abduction    Hip adduction    Hip internal rotation    Hip external rotation    Knee flexion  3+  Knee extension  3+  Ankle dorsiflexion    Ankle plantarflexion    Ankle inversion    Ankle eversion     (Blank rows = not tested)    GAIT: 08/30/2023: Ambulation in clinic c SPC in Rt UE with SBA.  Reduced knee extension in stance, maintaining knee flexion.   08/12/2023 Distance walked: in clinic distances  Assistive device utilized: Environmental consultant - 2 wheeled Level of assistance: Modified independence Comments: antalgic, limited knee ROM during gait cycle                 TREATMENT       DATE: 09/08/2023 TherEx:  Nustep level 2 for ROM gains with UE assist for 8 minutes  Discussed leg press variations, regressions, and progressions, as well as pain vs discomfort   TherAct Step up with 6 step practicing up with Rt and down with Lt as well as 1x10 leading with Lt up Step down with 4 step 1x2 ; discontinued as patient with minimal knee flexion and performing with compensatory trunk lean and hip hike  Bilat leg press 2x8 with 75# in available range   Manual  Lt LE on 6 step with max knee flexion, PT providing distraction, 6x for 10s  Vaso:  Left knee on wedge for 10 minutes with medium compression at 34deg   TREATMENT       DATE: 09/06/2023 Therex:  Nustep Lvl 5 for ROM gains with UE assist 8  mins Seated Lt leg LAQ with end range pauses each direction with contralateral leg movement opposite x15 0 lbs, x 15 3 lbs  Supine AAROM with strap Lt heel slide 5 sec hold x 10   TherActivity (to improve transfers, ambulation, squatting, stairs) Leg press double leg 75 lbs in available knee flexion x 15 Leg press single leg 37 lbs x 15 in available range  Manual Seated Lt knee flexion c distraction/IR mobilization c movements.  Contralateral leg movement opposite.  Contract/relax for knee flexion gains.   Vaso 10 mins Lt knee in elevation 34 degrees medium compression   TREATMENT       DATE: 09/01/2023 Therex:  Nustep Lvl 5 for ROM gains with UE assist 8 mins  Incilne gastroc stretch 30 sec x 4 Seated Lt knee LAQ 2 x 15 with end range pauses with Rt leg overpressure into flexion for a few seconds Supine heel slide AROM 5 sec hold x 5  Supine Lt leg LAQ with flexion stretch 5 sec hold x 10 in 90 deg hip flexion.   Neuro Re-ed (balance improvements, muscle activitation )  Tandem stance 1 min x 1 bilateral with occasional HHA on bars  Retro step x 15 bilateral with SBA to occasional HHA on bar Standing church pew anterior/posterior weight shifts ankle strategy , involuntary quad activation: 1.5 mins   Manual Seated Lt knee flexion c distraction/IR mobilization c movements.  Contralateral leg movement opposite.   Vaso 10 mins Lt leg in elevation medium compression 34 degrees  TREATMENT       DATE: 08/30/2023 Therex:  Nustep Lvl 5 for ROM of knee UE/LE 8 mins, movement to tolerance for good stretch.  Seated Lt knee flexion hamstring curl green band x 15 Seated Lt knee LAQ with 2.5 lb weight with end range pauses and contralateral leg movement opposite 2 x 15  Incline board gastroc stretch 30 sec x 3 bilateral   Gait Training SPC use in clinic household distances < 150 ft several times with cane in Rt UE.  Cues for sequencing and placement give at times in activity.  SBA required.    Neuro Re-ed Tandem stance 1 min x 2 bilateral with occasional HHA on bar Feet closer alternating heel/toe lifts x 15 with occasional HHA on bar   Manual Seated Lt knee flexion c distraction/IR mobilization c movements.  Contralateral leg movement opposite.   Vaso 10 mins Lt leg in elevation medium compression 34 degrees                                                                                                                                 PATIENT EDUCATION:  Education details: exam findings, POC, course of care following TKR  Person educated: Patient Education method: Explanation and Demonstration Education comprehension: verbalized understanding, returned demonstration, and needs further education  HOME EXERCISE PROGRAM:  Access Code: [email or web address] URL: [email or web address] Date: 08/22/2023 Prepared by: Lamar Ivory  Exercises - Seated Knee Flexion AAROM  - 5 x daily - 7 x weekly - 1 sets - 10 reps - 10 seconds hold - Supine Quadricep Sets  - 5 x daily - 7 x weekly - 2 sets - 10 reps - 5 second hold - Supine Heel Slide with Strap  - 5 x daily - 7 x weekly - 1 sets - 10 reps - 10 seconds hold  ASSESSMENT:  CLINICAL IMPRESSION: Patient arrived to session noting increased soreness in Lt knee that has improved since night of 09/06/2023. Patient continuing to be limited in knee flexion leading to compensatory movements to include during gait. Patient will continue to benefit from skilled PT.   OBJECTIVE IMPAIRMENTS: Abnormal gait, decreased activity tolerance, decreased balance, decreased knowledge of use of DME, decreased mobility, difficulty walking, decreased ROM, decreased strength, hypomobility, increased muscle spasms, impaired flexibility, and pain.   ACTIVITY LIMITATIONS: sitting, standing, squatting, sleeping, stairs, transfers, bed mobility, and locomotion level  PARTICIPATION LIMITATIONS: driving, shopping, community activity, yard work, and  church  PERSONAL FACTORS: Age, Behavior pattern, Education, Fitness, Past/current experiences, Social background, and Time since onset of injury/illness/exacerbation are also affecting patient's functional outcome.   REHAB POTENTIAL: Good  CLINICAL DECISION MAKING: Stable/uncomplicated  EVALUATION COMPLEXITY: Low   GOALS: Goals reviewed with patient? No  SHORT TERM GOALS: Target date: 09/09/2023   Will be compliant with appropriate progressive HEP Goal status: on going 08/30/2023    2. L knee AROM flexion to be at least 95* and extension AROM to be no more than 5* Goal status: on going 08/30/2023    3. Will be independent with edema management strategies  Goal status: on going 08/30/2023    4. Gait pattern to have normalized with LRAD Goal status: on going 08/30/2023    LONG TERM GOALS: Target date: 10/07/2023    MMT to have  improved by one grade all weak groups Goal status: initial   2. L Knee flexion AROM to be at least 110* Goal status: initial    3. Will be able to ascend and descend stairs reciprocally with no increase in pain Goal status: initial   4. Will be able to ambulate community distances and perform all household tasks with no increase in pain  Goal status: initial   5. PSFS to have improved by at least 2 points to show improved QOL and subjective improvement  Goal status: initial    PLAN:  PT FREQUENCY: 2x/week  PT DURATION: 8 weeks  PLANNED INTERVENTIONS: 97750- Physical Performance Testing, 97110-Therapeutic exercises, 97530- Therapeutic activity, [id number]- Neuromuscular re-education, 97535- Self Care, 02859- Manual therapy, [id number]- Gait training, and [id number]- Vasopneumatic device  PLAN FOR NEXT SESSION: Mobility gains, strengthening as able.     Susannah Daring, PT, DPT 09/08/23 2:00 PM

## 2023-09-08 ENCOUNTER — Ambulatory Visit (INDEPENDENT_AMBULATORY_CARE_PROVIDER_SITE_OTHER)

## 2023-09-08 DIAGNOSIS — R600 Localized edema: Secondary | ICD-10-CM

## 2023-09-08 DIAGNOSIS — M25562 Pain in left knee: Secondary | ICD-10-CM | POA: Diagnosis not present

## 2023-09-08 DIAGNOSIS — R2689 Other abnormalities of gait and mobility: Secondary | ICD-10-CM | POA: Diagnosis not present

## 2023-09-08 DIAGNOSIS — R262 Difficulty in walking, not elsewhere classified: Secondary | ICD-10-CM

## 2023-09-08 DIAGNOSIS — M6281 Muscle weakness (generalized): Secondary | ICD-10-CM | POA: Diagnosis not present

## 2023-09-08 DIAGNOSIS — R2681 Unsteadiness on feet: Secondary | ICD-10-CM

## 2023-09-08 DIAGNOSIS — G8929 Other chronic pain: Secondary | ICD-10-CM

## 2023-09-09 ENCOUNTER — Telehealth: Payer: Self-pay

## 2023-09-09 ENCOUNTER — Ambulatory Visit: Admitting: Orthopedic Surgery

## 2023-09-09 ENCOUNTER — Encounter: Payer: Self-pay | Admitting: Orthopedic Surgery

## 2023-09-09 DIAGNOSIS — Z96652 Presence of left artificial knee joint: Secondary | ICD-10-CM

## 2023-09-09 NOTE — Progress Notes (Signed)
 Post-Op Visit Note   Patient: Patricia Hoover           Date of Birth: [birth year]           MRN: [id number] Visit Date: 09/09/2023 PCP: Cleatus Arlyss RAMAN, MD   Assessment & Plan:  Chief Complaint:  Chief Complaint  Patient presents with   Left Knee - Routine Post Op     07/26/23 left TKA     Visit Diagnoses:  1. Status post total left knee replacement     Plan: Overall this patient is now about 5 weeks out left total knee replacement.  She has been trying to get the knee moving but it has been difficult for her.  On exam she has range of motion about 10-50.  Patella is getting more locked in.  She has been doing physical therapy.  Plan at this time is manipulation under anesthesia next week.  She will need physical therapy daily for the first 7 days after surgery as well as CPM machine.  Told her that essentially she had full flexion after the first knee replacement but that has not persisted.  We will get more flexion with my knee manipulation but in order for that to be maintained she will have to spend significant effort and it will be painful.  Risk and benefits are discussed including allergy to infection nerve or vessel damage fracture as well as recurrent stiffness in the knee.  All questions answered.  Follow-Up Instructions: No follow-ups on file.   Orders:  Orders Placed This Encounter  Procedures   Ambulatory referral to Physical Therapy   No orders of the defined types were placed in this encounter.   Imaging: No results found.  PMFS History: Patient Active Problem List   Diagnosis Date Noted   Arthritis of left knee 07/26/2023   Lower GI bleeding 06/29/2023   Hypokalemia 03/09/2023   Aortic atherosclerosis (HCC) 03/09/2023   Unilateral primary osteoarthritis, left knee 11/26/2022   Dysuria 04/21/2022   Stiff-legged gait 04/21/2022   Scalp lesion 09/21/2021   Clavicle enlargement 04/05/2021   Hyperpigmentation 03/01/2021   Vitamin D  deficiency 03/01/2021    Leg pain 05/21/2019   Right leg pain 05/21/2019   GI bleed 03/04/2018   Healthcare maintenance 01/08/2018   Paresthesia 01/08/2018   Neck pain 08/31/2017   Angioedema 06/15/2017   External hemorrhoids    Diverticulosis of large intestine without diverticulitis    Internal hemorrhoids    Advance care planning 12/23/2016   Renal stone 04/20/2016   Heartburn 12/17/2015   Medicare annual wellness visit, subsequent 06/05/2013   Hyperglycemia 06/05/2013   Knee pain 08/31/2012   Age-related osteoporosis without current pathological fracture 08/18/2012   HTN (hypertension) 08/09/2012   HLD (hyperlipidemia) 08/09/2012   Past Medical History:  Diagnosis Date   Angioedema 06/15/2017   Arrhythmia    possible hx, resolved prev   Blood transfusion without reported diagnosis 1972   had reaction; had to stop   Carpal tunnel syndrome    Cataract 2019   resolved with surgery   Complication of anesthesia    takes a long to wake up   Duodenal ulcer    H/O exercise stress test 2012   normal   Heart murmur    as child   Hematochezia    High cholesterol    History of kidney stones    Hx of colonic polyps    Hypertension    Osteoporosis 2010   t score - 3.9  Peptic ulcer of duodenum    Renal stones    Vitamin D  deficiency 03/01/2021    Family History  Problem Relation Age of Onset   Heart disease Mother    Renal Disease Mother    Heart failure Mother    Colon cancer Neg Hx    Breast cancer Neg Hx     Past Surgical History:  Procedure Laterality Date   ABDOMINAL HYSTERECTOMY  1972   for endometriosis   CATARACT EXTRACTION W/ INTRAOCULAR LENS IMPLANT Right 06/2017   CATARACT EXTRACTION W/ INTRAOCULAR LENS IMPLANT Left 07/2017   COLONOSCOPY Left 03/02/2017   Procedure: COLONOSCOPY;  Surgeon: Janalyn Keene NOVAK, MD;  Location: [facility] ENDOSCOPY;  Service: Endoscopy;  Laterality: Left;   COLONOSCOPY WITH PROPOFOL  N/A 05/04/2016   Procedure: COLONOSCOPY WITH PROPOFOL ;  Surgeon: Ruel Kung, MD;  Location: [facility] ENDOSCOPY;  Service: Endoscopy;  Laterality: N/A;   ESOPHAGOGASTRODUODENOSCOPY Left 03/02/2017   Procedure: ESOPHAGOGASTRODUODENOSCOPY (EGD);  Surgeon: Janalyn Keene NOVAK, MD;  Location[facility] ENDOSCOPY;  Service: Endoscopy;  Laterality: Left;   ESOPHAGOGASTRODUODENOSCOPY (EGD) WITH PROPOFOL  N/A 04/08/2017   Procedure: ESOPHAGOGASTRODUODENOSCOPY (EGD) WITH PROPOFOL ;  Surgeon: Janalyn Keene NOVAK, MD;  Location: [facility] ENDOSCOPY;  Service: Endoscopy;  Laterality: N/A;   LITHOTRIPSY     for kidney stone x5   LITHOTRIPSY  09/2017   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   Procedure: ARTHROPLASTY, KNEE, TOTAL, LEFT;  Surgeon: Addie Cordella Hamilton, MD;  Location: [facility] OR;  Service: Orthopedics;  Laterality: Left;   Social History   Occupational History   Occupation: Retired    Comment: Product manager  Tobacco Use   Smoking status: Former    Current packs/day: 0.00    Types: Cigarettes    Quit date: 01/19/1991    Years since quitting: 32.6   Smokeless tobacco: Never  Vaping Use   Vaping status: Never Used  Substance and Sexual Activity   Alcohol use: No    Comment: rare wine   Drug use: No   Sexual activity: Not on file

## 2023-09-09 NOTE — Telephone Encounter (Signed)
 Patient needs CPM set up for next week. Dr Addie plans to do MUA on either 8/26 or 8/28 whenever Debbie can get time at the OR.

## 2023-09-12 ENCOUNTER — Encounter (HOSPITAL_COMMUNITY): Payer: Self-pay | Admitting: Orthopedic Surgery

## 2023-09-12 ENCOUNTER — Other Ambulatory Visit: Payer: Self-pay

## 2023-09-12 NOTE — Telephone Encounter (Signed)
 Order has been sent

## 2023-09-12 NOTE — Progress Notes (Signed)
 SDW call  Patient was given pre-op instructions over the phone. Patient verbalized understanding of instructions provided.     PCP - Dr. Arlyss Solian Cardiologist -  Pulmonary:    PPM/ICD - denies Device Orders - na Rep Notified - na   Chest x-ray - na EKG -  07/19/2023 Stress Test - ECHO -  Cardiac Cath -   Sleep Study/sleep apnea/CPAP: denies  Non-diabetic  Blood Thinner Instructions:denies  Aspirin  Instructions:denies   ERAS Protcol - Clears until 1325  Anesthesia review: No  Denies shortness of breath, fever, cough and chest pain over the phone call  Your procedure is scheduled on Tuesday September 13, 2023  Report to [facility] Main Entrance A at 1355 PM., then check in with the Admitting office.  Call this number if you have problems the morning of surgery:  [phone]   If you have any questions prior to your surgery date call [phone]: Open Monday-Friday 8am-4pm If you experience any cold or flu symptoms such as cough, fever, chills, shortness of breath, etc. between now and your scheduled surgery, please notify us  at the above number     Remember:  Do not eat after midnight the night before your surgery  You may drink clear liquids until  1325 the day of your surgery.   Clear liquids allowed are: Water , Non-Citrus Juices (without pulp), Carbonated Beverages, Clear Tea, Black Coffee ONLY (NO MILK, CREAM OR POWDERED CREAMER of any kind), and Gatorade   Take these medicines the morning of surgery with A SIP OF WATER :  Amlodipine , simvastatin   As of today, STOP taking any Aspirin  (unless otherwise instructed by your surgeon) Aleve, Naproxen, Ibuprofen , Motrin , Advil , Goody's, BC's, all herbal medications, fish oil, and all vitamins.

## 2023-09-13 ENCOUNTER — Ambulatory Visit (HOSPITAL_COMMUNITY): Admitting: Anesthesiology

## 2023-09-13 ENCOUNTER — Ambulatory Visit (HOSPITAL_COMMUNITY)
Admission: RE | Admit: 2023-09-13 | Discharge: 2023-09-13 | Disposition: A | Discharge status: Home / Self Care | Attending: Orthopedic Surgery | Admitting: Orthopedic Surgery

## 2023-09-13 ENCOUNTER — Encounter (HOSPITAL_COMMUNITY): Payer: Self-pay | Admitting: Orthopedic Surgery

## 2023-09-13 ENCOUNTER — Other Ambulatory Visit: Payer: Self-pay

## 2023-09-13 ENCOUNTER — Encounter (HOSPITAL_COMMUNITY)
Admission: RE | Disposition: A | Discharge status: Home / Self Care | Payer: Self-pay | Source: Home / Self Care | Attending: Orthopedic Surgery

## 2023-09-13 DIAGNOSIS — M24662 Ankylosis, left knee: Secondary | ICD-10-CM

## 2023-09-13 DIAGNOSIS — M810 Age-related osteoporosis without current pathological fracture: Secondary | ICD-10-CM | POA: Diagnosis not present

## 2023-09-13 DIAGNOSIS — Z96652 Presence of left artificial knee joint: Secondary | ICD-10-CM | POA: Diagnosis not present

## 2023-09-13 DIAGNOSIS — Z87891 Personal history of nicotine dependence: Secondary | ICD-10-CM | POA: Diagnosis not present

## 2023-09-13 DIAGNOSIS — M25662 Stiffness of left knee, not elsewhere classified: Secondary | ICD-10-CM

## 2023-09-13 DIAGNOSIS — E7800 Pure hypercholesterolemia, unspecified: Secondary | ICD-10-CM | POA: Insufficient documentation

## 2023-09-13 DIAGNOSIS — I10 Essential (primary) hypertension: Secondary | ICD-10-CM | POA: Diagnosis not present

## 2023-09-13 HISTORY — PX: EXAM UNDER ANESTHESIA WITH MANIPULATION OF KNEE: SHX5816

## 2023-09-13 LAB — BASIC METABOLIC PANEL WITH GFR
Anion gap: 13 (ref 5–15)
BUN: 13 mg/dL (ref 8–23)
CO2: 25 mmol/L (ref 22–32)
Calcium: 9.5 mg/dL (ref 8.9–10.3)
Chloride: 101 mmol/L (ref 98–111)
Creatinine, Ser: 0.65 mg/dL (ref 0.44–1.00)
GFR, Estimated: >60 mL/min (ref >60)
Glucose, Bld: 93 mg/dL (ref 70–99)
Potassium: 3.1 mmol/L — ABNORMAL LOW (ref 3.5–5.1)
Sodium: 139 mmol/L (ref 135–145)

## 2023-09-13 LAB — CBC
HCT: 39.1 % (ref 36.0–46.0)
Hemoglobin: 12.7 g/dL (ref 12.0–15.0)
MCH: 28.6 pg (ref 26.0–34.0)
MCHC: 32.5 g/dL (ref 30.0–36.0)
MCV: 88.1 fL (ref 80.0–100.0)
Platelets: 316 K/uL (ref 150–400)
RBC: 4.44 MIL/uL (ref 3.87–5.11)
RDW: 13.3 % (ref 11.5–15.5)
WBC: 8.7 K/uL (ref 4.0–10.5)
nRBC: 0 % (ref 0.0–0.2)

## 2023-09-13 SURGERY — MANIPULATION, JOINT, KNEE, WITH ANESTHESIA
Anesthesia: General | Site: Knee | Laterality: Left

## 2023-09-13 MED ORDER — BUPIVACAINE HCL (PF) 0.25 % IJ SOLN
INTRAMUSCULAR | Status: DC | PRN
  Administered 2023-09-13: 30 mL

## 2023-09-13 MED ORDER — CHLORHEXIDINE GLUCONATE 0.12 % MT SOLN
15.0000 mL | Freq: Once | OROMUCOSAL | Status: AC
  Administered 2023-09-13: 15 mL via OROMUCOSAL
  Filled 2023-09-13: qty 15

## 2023-09-13 MED ORDER — LACTATED RINGERS IV SOLN: INTRAVENOUS | Status: DC

## 2023-09-13 MED ORDER — FENTANYL CITRATE (PF) 100 MCG/2ML IJ SOLN
25.0000 ug | INTRAMUSCULAR | Status: DC | PRN
  Administered 2023-09-13: 25 ug via INTRAVENOUS
  Administered 2023-09-13: 50 ug via INTRAVENOUS
  Administered 2023-09-13: 25 ug via INTRAVENOUS

## 2023-09-13 MED ORDER — FENTANYL CITRATE (PF) 250 MCG/5ML IJ SOLN
INTRAMUSCULAR | Status: DC | PRN
  Administered 2023-09-13 (×2): 50 ug via INTRAVENOUS

## 2023-09-13 MED ORDER — LIDOCAINE 2% (20 MG/ML) 5 ML SYRINGE
INTRAMUSCULAR | Status: DC | PRN
  Administered 2023-09-13: 100 mg via INTRAVENOUS

## 2023-09-13 MED ORDER — METHOCARBAMOL 500 MG PO TABS: 500.0000 mg | ORAL_TABLET | Freq: Three times a day (TID) | ORAL | 1 refills | Status: DC | PRN

## 2023-09-13 MED ORDER — OXYCODONE HCL 5 MG PO TABS: 5.0000 mg | ORAL_TABLET | Freq: Four times a day (QID) | ORAL | 0 refills | Status: DC | PRN

## 2023-09-13 MED ORDER — PROPOFOL 10 MG/ML IV BOLUS
INTRAVENOUS | Status: AC
  Filled 2023-09-13: qty 20

## 2023-09-13 MED ORDER — PROPOFOL 500 MG/50ML IV EMUL
INTRAVENOUS | Status: DC | PRN
  Administered 2023-09-13: 150 mg via INTRAVENOUS
  Administered 2023-09-13: 50 mg via INTRAVENOUS

## 2023-09-13 MED ORDER — PHENYLEPHRINE 80 MCG/ML (10ML) SYRINGE FOR IV PUSH (FOR BLOOD PRESSURE SUPPORT)
PREFILLED_SYRINGE | INTRAVENOUS | Status: DC | PRN
  Administered 2023-09-13 (×2): 80 ug via INTRAVENOUS

## 2023-09-13 MED ORDER — ASPIRIN 81 MG PO CHEW: 81.0000 mg | CHEWABLE_TABLET | Freq: Every day | ORAL | 0 refills | Status: DC

## 2023-09-13 MED ORDER — FENTANYL CITRATE (PF) 100 MCG/2ML IJ SOLN
INTRAMUSCULAR | Status: AC
  Filled 2023-09-13: qty 2

## 2023-09-13 MED ORDER — OXYCODONE HCL 5 MG/5ML PO SOLN: 5.0000 mg | Freq: Once | ORAL | Status: DC | PRN

## 2023-09-13 MED ORDER — DEXAMETHASONE SODIUM PHOSPHATE 10 MG/ML IJ SOLN
INTRAMUSCULAR | Status: DC | PRN
  Administered 2023-09-13: 4 mg via INTRAVENOUS

## 2023-09-13 MED ORDER — CLONIDINE HCL (ANALGESIA) 100 MCG/ML EP SOLN
EPIDURAL | Status: DC | PRN
  Administered 2023-09-13: 27 mL via INTRA_ARTICULAR

## 2023-09-13 MED ORDER — MORPHINE SULFATE (PF) 4 MG/ML IV SOLN
INTRAVENOUS | Status: AC
  Filled 2023-09-13: qty 2

## 2023-09-13 MED ORDER — POVIDONE-IODINE 7.5 % EX SOLN
Freq: Once | CUTANEOUS | Status: DC
  Filled 2023-09-13: qty 118

## 2023-09-13 MED ORDER — FENTANYL CITRATE (PF) 250 MCG/5ML IJ SOLN
INTRAMUSCULAR | Status: AC
Start: 2023-09-13 — End: 2023-09-13
  Filled 2023-09-13: qty 5

## 2023-09-13 MED ORDER — POVIDONE-IODINE 10 % EX SWAB: 2.0000 | Freq: Once | CUTANEOUS | Status: DC

## 2023-09-13 MED ORDER — AMISULPRIDE (ANTIEMETIC) 5 MG/2ML IV SOLN: 10.0000 mg | Freq: Once | INTRAVENOUS | Status: DC | PRN

## 2023-09-13 MED ORDER — ONDANSETRON HCL 4 MG/2ML IJ SOLN
INTRAMUSCULAR | Status: DC | PRN
  Administered 2023-09-13: 4 mg via INTRAVENOUS

## 2023-09-13 MED ORDER — LIDOCAINE 2% (20 MG/ML) 5 ML SYRINGE
INTRAMUSCULAR | Status: AC
Start: 2023-09-13 — End: 2023-09-13
  Filled 2023-09-13: qty 5

## 2023-09-13 MED ORDER — ORAL CARE MOUTH RINSE: 15.0000 mL | Freq: Once | OROMUCOSAL | Status: AC

## 2023-09-13 MED ORDER — MORPHINE SULFATE 4 MG/ML IJ SOLN
INTRAMUSCULAR | Status: DC | PRN
  Administered 2023-09-13: 8 mg via INTRAVENOUS

## 2023-09-13 MED ORDER — ACETAMINOPHEN 500 MG PO TABS: 1000.0000 mg | ORAL_TABLET | Freq: Once | ORAL | Status: DC

## 2023-09-13 MED ORDER — ACETAMINOPHEN 500 MG PO TABS
1000.0000 mg | ORAL_TABLET | Freq: Once | ORAL | Status: AC
  Administered 2023-09-13: 1000 mg via ORAL
  Filled 2023-09-13: qty 2

## 2023-09-13 MED ORDER — POVIDONE-IODINE 10 % EX SWAB
2.0000 | Freq: Once | CUTANEOUS | Status: AC
  Administered 2023-09-13: 2 via TOPICAL

## 2023-09-13 MED ORDER — OXYCODONE HCL 5 MG PO TABS: 5.0000 mg | ORAL_TABLET | Freq: Once | ORAL | Status: DC | PRN

## 2023-09-13 NOTE — Op Note (Signed)
 NAMEANTHONELLA, Patricia Hoover MEDICAL RECORD NO: [id number] ACCOUNT NO: [id number] DATE OF BIRTH: 1945-12-24 FACILITY: [facility] LOCATION: [facility] PHYSICIAN: Cordella RAMAN. Addie, MD  Operative Report   DATE OF PROCEDURE: 09/13/2023  PREOPERATIVE DIAGNOSIS:  Left knee arthrofibrosis.  POSTOPERATIVE DIAGNOSIS:  Left knee arthrofibrosis.  PROCEDURE:  Left knee manipulation under anesthesia with injection of postop pain reliever.  SURGEON:  Cordella RAMAN. Addie, MD.  ASSISTANT:  Herlene Calix, PA  INDICATIONS:  The patient is a 78 year old who is now about 6 weeks out from left total knee replacement.  The patient has had difficulty getting her range of motion.  The patient presents now for manipulation under anesthesia after explanation of risks  and benefits.  DESCRIPTION OF PROCEDURE:  The patient was brought to the operating room where anesthetic was induced.  Timeout was called.  Left knee had range of motion of about 60 degrees of flexion to slightly less than 10 degrees from full extension.  After calling  timeout, the knee was manipulated into 125 degrees of flexion.  Knee had very good range of motion.  Good stability maintained.  At this time, the area was prescrubbed with alcohol and Betadine  and prepped with ChloraPrep solution and an injection of  Marcaine , morphine , and clonidine  was injected into the knee for postop pain relief.  The patient tolerated the procedure well without immediate complication.  Ace wrap was applied after Band-Aid. Transferred to the recovery room in stable condition.   The patient will start physical therapy and CPM tomorrow.    MUK D: 09/13/2023 2:02:17 pm T: 09/13/2023 10:46:00 pm  JOB: [id number]/ [id number]

## 2023-09-13 NOTE — Brief Op Note (Signed)
   09/13/2023  2:02 PM  PATIENT:  Patricia Hoover  78 y.o. female  PRE-OPERATIVE DIAGNOSIS:  STIFFNESS LEFT KNEE  POST-OPERATIVE DIAGNOSIS:  STIFFNESS LEFT KNEE  PROCEDURE:  Procedure(s): MANIPULATION, JOINT, KNEE, WITH ANESTHESIA  SURGEON:  Surgeon(s): Addie, Cordella Hamilton, MD  ASSISTANT: magnant pa  ANESTHESIA:   general  EBL: 0 ml    Total I/O In: 400 [I.V.:400] Out: 0   BLOOD ADMINISTERED: none  DRAINS: none   LOCAL MEDICATIONS USED:  marcaine   clonidine , morphine   SPECIMEN:  No Specimen  COUNTS:  YES  TOURNIQUET:  * No tourniquets in log *  DICTATION: .Other Dictation: Dictation Number done  PLAN OF CARE: Discharge to home after PACU  PATIENT DISPOSITION:  PACU - hemodynamically stable

## 2023-09-13 NOTE — Transfer of Care (Signed)
 Immediate Anesthesia Transfer of Care Note  Patient: Patricia Hoover  Procedure(s) Performed: MANIPULATION, JOINT, KNEE, WITH ANESTHESIA (Left: Knee)  Patient Location: PACU  Anesthesia Type:General  Level of Consciousness: awake and alert   Airway & Oxygen Therapy: Patient Spontanous Breathing and Patient connected to nasal cannula oxygen  Post-op Assessment: Report given to RN and Post -op Vital signs reviewed and stable  Post vital signs: Reviewed and stable  Last Vitals:  Vitals Value Taken Time  BP 162/87 09/13/23 14:04  Temp    Pulse 104 09/13/23 14:05  Resp 18 09/13/23 14:05  SpO2 97 % 09/13/23 14:05  Vitals shown include unfiled device data.  Last Pain:  Vitals:   09/13/23 1142  TempSrc:   PainSc: 0-No pain      Patients Stated Pain Goal: 0 (09/13/23 1124)  Complications: No notable events documented.

## 2023-09-13 NOTE — Anesthesia Procedure Notes (Signed)
 Procedure Name: LMA Insertion Date/Time: 09/13/2023 1:45 PM  Performed by: Lanning Cena RAMAN, CRNAPre-anesthesia Checklist: Patient identified, Emergency Drugs available, Patient being monitored, Suction available and Timeout performed Patient Re-evaluated:Patient Re-evaluated prior to induction Oxygen Delivery Method: Circle system utilized Preoxygenation: Pre-oxygenation with 100% oxygen Induction Type: IV induction LMA: LMA inserted LMA Size: 4.0 Number of attempts: 2 Dental Injury: Teeth and Oropharynx as per pre-operative assessment

## 2023-09-13 NOTE — H&P (Signed)
 Patricia Hoover is an 78 y.o. female.   Chief Complaint: Left knee arthrofibrosis HPI: Patricia Hoover is a 78 year old patient who is about 6 weeks out left total knee replacement.  She has had difficulty gaining flexion range of motion.  She has plateaued in physical therapy.  Presents now for operative management after explanation risk benefits  Past Medical History:  Diagnosis Date   Angioedema 06/15/2017   Arrhythmia    possible hx, resolved prev   Blood transfusion without reported diagnosis 1972   had reaction; had to stop   Carpal tunnel syndrome    Cataract 2019   resolved with surgery   Complication of anesthesia    takes a long to wake up   Duodenal ulcer    H/O exercise stress test 2012   normal   Heart murmur    as child   Hematochezia    High cholesterol    History of kidney stones    Hx of colonic polyps    Hypertension    Osteoporosis 2010   t score - 3.9   Peptic ulcer of duodenum    Renal stones    Vitamin D  deficiency 03/01/2021    Past Surgical History:  Procedure Laterality Date   ABDOMINAL HYSTERECTOMY  1972   for endometriosis   CATARACT EXTRACTION W/ INTRAOCULAR LENS IMPLANT Right 06/2017   CATARACT EXTRACTION W/ INTRAOCULAR LENS IMPLANT Left 07/2017   COLONOSCOPY Left 03/02/2017   Procedure: COLONOSCOPY;  Surgeon: Janalyn Keene NOVAK, MD;  Location: [facility] ENDOSCOPY;  Service: Endoscopy;  Laterality: Left;   COLONOSCOPY WITH PROPOFOL  N/A 05/04/2016   Procedure: COLONOSCOPY WITH PROPOFOL ;  Surgeon: Ruel Kung, MD;  Location: [facility] ENDOSCOPY;  Service: Endoscopy;  Laterality: N/A;   ESOPHAGOGASTRODUODENOSCOPY Left 03/02/2017   Procedure: ESOPHAGOGASTRODUODENOSCOPY (EGD);  Surgeon: Janalyn Keene NOVAK, MD;  Location: [facility] ENDOSCOPY;  Service: Endoscopy;  Laterality: Left;   ESOPHAGOGASTRODUODENOSCOPY (EGD) WITH PROPOFOL  N/A 04/08/2017   Procedure: ESOPHAGOGASTRODUODENOSCOPY (EGD) WITH PROPOFOL ;  Surgeon: Janalyn Keene NOVAK, MD;  Location: [facility] ENDOSCOPY;  Service:  Endoscopy;  Laterality: N/A;   LITHOTRIPSY     for kidney stone x5   LITHOTRIPSY  09/2017   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   Procedure: ARTHROPLASTY, KNEE, TOTAL, LEFT;  Surgeon: Addie Cordella Hamilton, MD;  Location: [facility] OR;  Service: Orthopedics;  Laterality: Left;    Family History  Problem Relation Age of Onset   Heart disease Mother    Renal Disease Mother    Heart failure Mother    Colon cancer Neg Hx    Breast cancer Neg Hx    Social History:  reports that she quit smoking about 32 years ago. Her smoking use included cigarettes. She has never used smokeless tobacco. She reports that she does not drink alcohol and does not use drugs.  Allergies:  Allergies  Allergen Reactions   Ace Inhibitors Swelling   Angiotensin Receptor Blockers     Would avoid, h/o swelling with ACE prev   Aspirin  Other (See Comments)    GI bleed   Bisphosphonates     GI side effects   Nsaids     GI bleed   Prolia [Denosumab]     GI upset    Medications Prior to Admission  Medication Sig Dispense Refill   amLODipine  (NORVASC ) 2.5 MG tablet Take 1 tablet (2.5 mg total) by mouth daily. 90 tablet 3   Ascorbic Acid (VITAMIN C GUMMIE PO) Take 2 each by mouth in the morning.     Cholecalciferol  (VITAMIN  D3 GUMMIES PO) Take 2 each by mouth in the morning.     docusate sodium  (COLACE) 100 MG capsule Take 100 mg by mouth 2 (two) times daily as needed (constipation.).     indapamide  (LOZOL ) 2.5 MG tablet Take 1 tablet (2.5 mg total) by mouth daily. 90 tablet 3   polyethylene glycol powder (GLYCOLAX/MIRALAX) 17 GM/SCOOP powder Take 17 g by mouth daily as needed for mild constipation.     Potassium Citrate  15 MEQ (1620 MG) TBCR Take 1 tablet by mouth in the morning and at bedtime.     simvastatin  (ZOCOR ) 10 MG tablet Take 1 tablet (10 mg total) by mouth every other day. 45 tablet 3    Results for orders placed or performed during the hospital encounter of 09/13/23 (from the past 48 hours)  Basic  metabolic panel per protocol     Status: Abnormal   Collection Time: 09/13/23 11:06 AM  Result Value Ref Range   Sodium 139 135 - 145 mmol/L   Potassium 3.1 (L) 3.5 - 5.1 mmol/L   Chloride 101 98 - 111 mmol/L   CO2 25 22 - 32 mmol/L   Glucose, Bld 93 70 - 99 mg/dL    Comment: Glucose reference range applies only to samples taken after fasting for at least 8 hours.   BUN 13 8 - 23 mg/dL   Creatinine, Ser 9.34 0.44 - 1.00 mg/dL   Calcium  9.5 8.9 - 10.3 mg/dL   GFR, Estimated >39 >39 mL/min    Comment: (NOTE) Calculated using the CKD-EPI Creatinine Equation (2021)    Anion gap 13 5 - 15    Comment: Performed at [facility], [address] [place], [place] [address]  CBC per protocol     Status: None   Collection Time: 09/13/23 11:06 AM  Result Value Ref Range   WBC 8.7 4.0 - 10.5 K/uL   RBC 4.44 3.87 - 5.11 MIL/uL   Hemoglobin 12.7 12.0 - 15.0 g/dL   HCT 60.8 63.9 - 53.9 %   MCV 88.1 80.0 - 100.0 fL   MCH 28.6 26.0 - 34.0 pg   MCHC 32.5 30.0 - 36.0 g/dL   RDW 86.6 88.4 - 84.4 %   Platelets 316 150 - 400 K/uL   nRBC 0.0 0.0 - 0.2 %    Comment: Performed at [facility], [address], [place], [place] [address]   No results found.  Review of Systems  Musculoskeletal:  Positive for arthralgias.  All other systems reviewed and are negative.   Blood pressure (!) 146/77, pulse 87, temperature 97.7 F (36.5 C), temperature source Oral, resp. rate 19, height 4' 8 (1.422 m), weight 63.5 kg, SpO2 98%. Physical Exam Vitals reviewed.  HENT:     Head: Normocephalic.     Nose: Nose normal.     Mouth/Throat:     Mouth: Mucous membranes are moist.  Eyes:     Pupils: Pupils are equal, round, and reactive to light.  Cardiovascular:     Rate and Rhythm: Normal rate.     Pulses: Normal pulses.  Pulmonary:     Effort: Pulmonary effort is normal.  Abdominal:     General: Abdomen is flat.  Musculoskeletal:     Cervical back: Normal range of motion.  Skin:     General: Skin is warm.     Capillary Refill: Capillary refill takes less than 2 seconds.  Neurological:     General: No focal deficit present.  Mental Status: She is alert.  Psychiatric:        Mood and Affect: Mood normal.   Ortho exam demonstrates range of motion on the left of 10 degrees to 50 degrees.  Extensor mechanism intact.  No calf tenderness.  Ankle dorsiflexion intact.  No skin changes or color differences temperature differences left knee versus right knee.  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  Plan is for manipulation under anesthesia with injection into the joint of postop pain relieving medication.  We will start CPM machine tomorrow along with physical therapy tomorrow.  The risk and benefits are explained to the patient including not limited to infection nerve vessel damage knee stiffness fracture as well as potential that this may not help if she does not maintain the range of motion gains that we achieved.  Patient understands risk benefits and wishes to proceed.  KANDICE Glendia Hutchinson, MD 09/13/2023, 1:18 PM

## 2023-09-13 NOTE — Anesthesia Preprocedure Evaluation (Addendum)
 Anesthesia Evaluation  Patient identified by MRN, date of birth, ID band Patient awake    Reviewed: Allergy & Precautions, NPO status , Patient's Chart, lab work & pertinent test results  Airway Mallampati: II  TM Distance: >3 FB Neck ROM: Full    Dental no notable dental hx. (+) Teeth Intact, Dental Advisory Given   Pulmonary former smoker   Pulmonary exam normal breath sounds clear to auscultation       Cardiovascular hypertension, Pt. on medications Normal cardiovascular exam Rhythm:Regular Rate:Normal  HLD   Neuro/Psych negative neurological ROS  negative psych ROS   GI/Hepatic Neg liver ROS, PUD,,,  Endo/Other  negative endocrine ROS    Renal/GU Renal disease  negative genitourinary   Musculoskeletal  (+) Arthritis ,    Abdominal   Peds  Hematology negative hematology ROS (+)   Anesthesia Other Findings   Reproductive/Obstetrics                              Anesthesia Physical Anesthesia Plan  ASA: 2  Anesthesia Plan: General   Post-op Pain Management:    Induction: Intravenous  PONV Risk Score and Plan: 3 and Ondansetron , Dexamethasone  and Treatment may vary due to age or medical condition  Airway Management Planned: LMA  Additional Equipment:   Intra-op Plan:   Post-operative Plan: Extubation in OR  Informed Consent: I have reviewed the patients History and Physical, chart, labs and discussed the procedure including the risks, benefits and alternatives for the proposed anesthesia with the patient or authorized representative who has indicated his/her understanding and acceptance.     Dental advisory given  Plan Discussed with: CRNA  Anesthesia Plan Comments:          Anesthesia Quick Evaluation

## 2023-09-14 ENCOUNTER — Encounter (HOSPITAL_COMMUNITY): Payer: Self-pay | Admitting: Orthopedic Surgery

## 2023-09-14 ENCOUNTER — Telehealth: Payer: Self-pay

## 2023-09-14 ENCOUNTER — Ambulatory Visit (INDEPENDENT_AMBULATORY_CARE_PROVIDER_SITE_OTHER): Admitting: Physical Therapy

## 2023-09-14 DIAGNOSIS — M6281 Muscle weakness (generalized): Secondary | ICD-10-CM

## 2023-09-14 DIAGNOSIS — R262 Difficulty in walking, not elsewhere classified: Secondary | ICD-10-CM | POA: Diagnosis not present

## 2023-09-14 DIAGNOSIS — M25662 Stiffness of left knee, not elsewhere classified: Secondary | ICD-10-CM | POA: Diagnosis not present

## 2023-09-14 DIAGNOSIS — R2689 Other abnormalities of gait and mobility: Secondary | ICD-10-CM | POA: Diagnosis not present

## 2023-09-14 DIAGNOSIS — R600 Localized edema: Secondary | ICD-10-CM

## 2023-09-14 DIAGNOSIS — G8929 Other chronic pain: Secondary | ICD-10-CM

## 2023-09-14 DIAGNOSIS — M25562 Pain in left knee: Secondary | ICD-10-CM

## 2023-09-14 DIAGNOSIS — R2681 Unsteadiness on feet: Secondary | ICD-10-CM

## 2023-09-14 NOTE — Therapy (Addendum)
 OUTPATIENT PHYSICAL THERAPY TREATMENT & PROGRESS NOTE/RE-CERTIFICATION   Patient Name: Patricia Hoover MRN: [id number] DOB:Jun 07, 1945, 78 y.o., female Today's Date: 09/14/2023  Progress Note Reporting Period 08/12/2023 to 09/14/2023  See note below for Objective Data and Assessment of Progress/Goals.      END OF SESSION:  PT End of Session - 09/14/23 1104     Visit Number 8    Number of Visits 30    Date for PT Re-Evaluation 11/11/23    Authorization Type MCR    Authorization Time Period --    Progress Note Due on Visit 18    PT Start Time 1101    PT Stop Time 1200    PT Time Calculation (min) 59 min    Activity Tolerance Patient tolerated treatment well    Behavior During Therapy WFL for tasks assessed/performed               Past Medical History:  Diagnosis Date   Angioedema 06/15/2017   Arrhythmia    possible hx, resolved prev   Blood transfusion without reported diagnosis 1972   had reaction; had to stop   Carpal tunnel syndrome    Cataract 2019   resolved with surgery   Complication of anesthesia    takes a long to wake up   Duodenal ulcer    H/O exercise stress test 2012   normal   Heart murmur    as child   Hematochezia    High cholesterol    History of kidney stones    Hx of colonic polyps    Hypertension    Osteoporosis 2010   t score - 3.9   Peptic ulcer of duodenum    Renal stones    Vitamin D  deficiency 03/01/2021   Past Surgical History:  Procedure Laterality Date   ABDOMINAL HYSTERECTOMY  1972   for endometriosis   CATARACT EXTRACTION W/ INTRAOCULAR LENS IMPLANT Right 06/2017   CATARACT EXTRACTION W/ INTRAOCULAR LENS IMPLANT Left 07/2017   COLONOSCOPY Left 03/02/2017   Procedure: COLONOSCOPY;  Surgeon: Janalyn Keene NOVAK, MD;  Location: [facility] ENDOSCOPY;  Service: Endoscopy;  Laterality: Left;   COLONOSCOPY WITH PROPOFOL  N/A 05/04/2016   Procedure: COLONOSCOPY WITH PROPOFOL ;  Surgeon: Ruel Kung, MD;  Location: [facility] ENDOSCOPY;  Service:  Endoscopy;  Laterality: N/A;   ESOPHAGOGASTRODUODENOSCOPY Left 03/02/2017   Procedure: ESOPHAGOGASTRODUODENOSCOPY (EGD);  Surgeon: Janalyn Keene NOVAK, MD;  Location: [facility] ENDOSCOPY;  Service: Endoscopy;  Laterality: Left;   ESOPHAGOGASTRODUODENOSCOPY (EGD) WITH PROPOFOL  N/A 04/08/2017   Procedure: ESOPHAGOGASTRODUODENOSCOPY (EGD) WITH PROPOFOL ;  Surgeon: Janalyn Keene NOVAK, MD;  Location: [facility] ENDOSCOPY;  Service: Endoscopy;  Laterality: N/A;   EXAM UNDER ANESTHESIA WITH MANIPULATION OF KNEE Left 09/13/2023   Procedure: MANIPULATION, JOINT, KNEE, WITH ANESTHESIA;  Surgeon: Addie Cordella Hamilton, MD;  Location: [facility] OR;  Service: Orthopedics;  Laterality: Left;   LITHOTRIPSY     for kidney stone x5   LITHOTRIPSY  09/2017   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   Procedure: ARTHROPLASTY, KNEE, TOTAL, LEFT;  Surgeon: Addie Cordella Hamilton, MD;  Location: [facility] OR;  Service: Orthopedics;  Laterality: Left;   Patient Active Problem List   Diagnosis Date Noted   Arthritis of left knee 07/26/2023   Lower GI bleeding 06/29/2023   Hypokalemia 03/09/2023   Aortic atherosclerosis (HCC) 03/09/2023   Unilateral primary osteoarthritis, left knee 11/26/2022   Dysuria 04/21/2022   Stiff-legged gait 04/21/2022   Scalp lesion 09/21/2021   Clavicle enlargement 04/05/2021   Hyperpigmentation 03/01/2021   Vitamin D   deficiency 03/01/2021   Leg pain 05/21/2019   Right leg pain 05/21/2019   GI bleed 03/04/2018   Healthcare maintenance 01/08/2018   Paresthesia 01/08/2018   Neck pain 08/31/2017   Angioedema 06/15/2017   External hemorrhoids    Diverticulosis of large intestine without diverticulitis    Internal hemorrhoids    Advance care planning 12/23/2016   Renal stone 04/20/2016   Heartburn 12/17/2015   Medicare annual wellness visit, subsequent 06/05/2013   Hyperglycemia 06/05/2013   Knee pain 08/31/2012   Age-related osteoporosis without current pathological fracture 08/18/2012   HTN (hypertension)  08/09/2012   HLD (hyperlipidemia) 08/09/2012    PCP: Cleatus Molly MD   REFERRING PROVIDER: Addie Cordella Hamilton, MD  REFERRING DIAG: Diagnosis [phone] (ICD-10-CM) - Chronic pain of left knee M17.12 (ICD-10-CM) - Unilateral primary osteoarthritis, left knee  THERAPY DIAG:  Stiffness of left knee, not elsewhere classified  Chronic pain of left knee  Difficulty in walking, not elsewhere classified  Muscle weakness (generalized)  Other abnormalities of gait and mobility  Localized edema  Unsteadiness on feet  Rationale for Evaluation and Treatment: Rehabilitation  ONSET DATE: Lt TKR 07/26/23  SUBJECTIVE:   SUBJECTIVE STATEMENT:  Patient had manipulation done yesterday.   PERTINENT HISTORY: Left TKA 07/26/2023, OA, osteoporosis, heart murmur, HTN  PAIN:  NPRS scale: 5/10 when walking Pain location: Lt knee  Pain description:  stiffness, tightness.  Aggravating factors: bending it  Relieving factors: nothing besides pain pills   PRECAUTIONS: None  RED FLAGS: None   WEIGHT BEARING RESTRICTIONS: No  FALLS:  Has patient fallen in last 6 months? No  LIVING ENVIRONMENT: Lives with: lives with their family Lives in: House/apartment Stairs:  flight inside but does not have to go up  Has following equipment at home: Vannie - 2 wheeled  OCCUPATION: retiredWater engineer at an [employer]   PLOF: Independent, Independent with basic ADLs, Independent with gait, and Independent with transfers  PATIENT GOALS: be able to line dance   NEXT MD VISIT: Referring 09/23/2023  OBJECTIVE:  Note: Objective measures were completed at Evaluation unless otherwise noted.  PATIENT SURVEYS:  PSFS: THE PATIENT SPECIFIC FUNCTIONAL SCALE  Place score of 0-10 (0 = unable to perform activity and 10 = able to perform activity at the same level as before injury or problem)  Activity Date: 08/12/23 09/14/23    Bending knee  5 1   2. Lifting leg  5 1   3.walking  2   4. Standing  for ADLs  2   Total Score 5 1.5     Total Score = Sum of activity scores/number of activities  Minimally Detectable Change: 3 points (for single activity); 2 points (for average score)   COGNITION: 08/12/2023 Overall cognitive status: Within functional limits for tasks assessed     SENSATION: 08/12/2023 [facility]  EDEMA:  08/12/2023 Localized edema appropriate for post-op state    LOWER EXTREMITY ROM:  Active ROM Left eval Left  08/22/23 Left  08/24/23 Left 09/01/23 Left  09/14/23  Hip flexion       Hip extension       Hip abduction       Hip adduction       Hip internal rotation       Hip external rotation       Knee flexion 67 64* 73* 62 in supine AROM heel slide  721 in PROM heel slide Supine 90/90 position A: 68*  Knee extension -9 -5* -5* -8 in seated AROM  LAQ  Supine -9*  Ankle dorsiflexion       Ankle plantarflexion       Ankle inversion       Ankle eversion        (Blank rows = not tested)  LOWER EXTREMITY MMT:  MMT Left Eval 08/12/2023 Left 09/14/23  Hip flexion    Hip extension    Hip abduction    Hip adduction    Hip internal rotation    Hip external rotation    Knee flexion 3+ 3-/5  Knee extension 3+ 3-/5  Ankle dorsiflexion    Ankle plantarflexion    Ankle inversion    Ankle eversion     (Blank rows = not tested)    GAIT: 08/30/2023: Ambulation in clinic c SPC in Rt UE with SBA.  Reduced knee extension in stance, maintaining knee flexion.   08/12/2023 Distance walked: in clinic distances  Assistive device utilized: Environmental consultant - 2 wheeled Level of assistance: Modified independence Comments: antalgic, limited knee ROM during gait cycle                   TREATMENT       DATE: 09/14/2023 TherEx:  Working on functional range: SciFit, seat 6 for first 4 minutes with partial circles, seat 5 for 5 minutes and was able to complete full backward circles with some manual assistance from SPT for 3 revolutions (pt requested to stop due to pain) Supine  heel slides with strap and ball, 2x10.  Patient had a difficult time bending the knee and understanding the exercises. SPT tactile & verbal cues for technique Supine 90/90 position with gravity assist knee flexion. Knee flexion to 68 degrees.  Pt guarding with anxiety and fear of pain.   Manual Seated knee flexion overpressure  Distraction, IR, and overpressure knee flexion with contract,relax technique   Vaso Lt LE elevated, medium compression, 34 degrees for 10 minutes    TREATMENT       DATE: 09/08/2023 TherEx:  Nustep level 2 for ROM gains with UE assist for 8 minutes  Discussed leg press variations, regressions, and progressions, as well as pain vs discomfort   TherAct Step up with 6 step practicing up with Rt and down with Lt as well as 1x10 leading with Lt up Step down with 4 step 1x2 ; discontinued as patient with minimal knee flexion and performing with compensatory trunk lean and hip hike  Bilat leg press 2x8 with 75# in available range   Manual  Lt LE on 6 step with max knee flexion, PT providing distraction, 6x for 10s  Vaso:  Left knee on wedge for 10 minutes with medium compression at 34deg     TREATMENT       DATE: 09/06/2023 Therex:  Nustep Lvl 5 for ROM gains with UE assist 8 mins Seated Lt leg LAQ with end range pauses each direction with contralateral leg movement opposite x15 0 lbs, x 15 3 lbs  Supine AAROM with strap Lt heel slide 5 sec hold x 10   TherActivity (to improve transfers, ambulation, squatting, stairs) Leg press double leg 75 lbs in available knee flexion x 15 Leg press single leg 37 lbs x 15 in available range  Manual Seated Lt knee flexion c distraction/IR mobilization c movements.  Contralateral leg movement opposite.  Contract/relax for knee flexion gains.   Vaso 10 mins Lt knee in elevation 34 degrees medium compression    PATIENT EDUCATION:  Education details: exam findings, POC, course  of care following TKR  Person educated:  Patient Education method: Explanation and Demonstration Education comprehension: verbalized understanding, returned demonstration, and needs further education  HOME EXERCISE PROGRAM:  Access Code: [email or web address] URL: [email or web address] Date: 08/22/2023 Prepared by: Lamar Ivory  Exercises - Seated Knee Flexion AAROM  - 5 x daily - 7 x weekly - 1 sets - 10 reps - 10 seconds hold - Supine Quadricep Sets  - 5 x daily - 7 x weekly - 2 sets - 10 reps - 5 second hold - Supine Heel Slide with Strap  - 5 x daily - 7 x weekly - 1 sets - 10 reps - 10 seconds hold  ASSESSMENT:  CLINICAL IMPRESSION: Patient had manipulation done under anesthesia yesterday.  She would benefit from PT 4-5 days/wk for next 2 weeks to maximize benefits of MUA.  Patient tolerated manual treatment and exercises well today and had a knee flexion ROM increase to 68*. Patient understands the importance of coming to PT everyday and working at home for ROM gains.  Patient will continue to benefit from skilled PT to maximize ROM.   OBJECTIVE IMPAIRMENTS: Abnormal gait, decreased activity tolerance, decreased balance, decreased knowledge of use of DME, decreased mobility, difficulty walking, decreased ROM, decreased strength, hypomobility, increased muscle spasms, impaired flexibility, and pain.   ACTIVITY LIMITATIONS: sitting, standing, squatting, sleeping, stairs, transfers, bed mobility, and locomotion level  PARTICIPATION LIMITATIONS: driving, shopping, community activity, yard work, and church  PERSONAL FACTORS: Age, Behavior pattern, Education, Fitness, Past/current experiences, Social background, and Time since onset of injury/illness/exacerbation are also affecting patient's functional outcome.   REHAB POTENTIAL: Good  CLINICAL DECISION MAKING: Stable/uncomplicated  EVALUATION COMPLEXITY: Low   GOALS: Goals reviewed with patient? No  SHORT TERM GOALS: Target date: 10/07/2023   Will be compliant with  appropriate progressive HEP Goal status: updated, 09/14/2023  2. Lt knee ROM 5-85* Goal status: updated, 09/14/2023  3. Will be independent with edema management strategies  Goal status: updated, 09/14/2023   LONG TERM GOALS: Target date: 11/11/2023   Patient will demonstrate Lt knee MMT WFL throughout to faciltiate usual transfers, stairs, squatting at PLOF for daily life.  Goal status: updated, 09/14/2023  2. L Knee flexion AROM -5 to 90* Goal status: updated, 09/14/2023  3. Will be able to ascend and descend stairs reciprocally with no increase in pain Goal status: updated, 09/14/2023  4. Will be able to ambulate community distances and perform all household tasks with no increase in pain w/ LRAD  Goal status: updated, 09/14/2023  5.  Patient will demonstrate Patient specific functional scale >5 to indicate reduced disability due to condition.  Goal status: updated, 09/14/2023  6. L Knee flexion PROM -2 to 100* Goal status: updated, 09/14/2023   PLAN:  PT FREQUENCY:  4-5x/week for 2 weeks then 2-3x/week for 6 weeks  PT DURATION: 8 weeks  PLANNED INTERVENTIONS: 97750- Physical Performance Testing, 97110-Therapeutic exercises, 97530- Therapeutic activity, 97112- Neuromuscular re-education, 97535- Self Care, [id number]- Manual therapy, [phone]- Gait training, and [id number]- Vasopneumatic device  PLAN FOR NEXT SESSION: Update HEP and flexion/extension gains    Ismael Theophilus Stallion, SPT 09/14/23 4:58 PM   This entire session of physical therapy was performed under the direct supervision of PT signing evaluation /treatment. PT reviewed note and agrees.   Grayce Spatz, PT 09/14/2023, 9:18 PM

## 2023-09-14 NOTE — Anesthesia Postprocedure Evaluation (Signed)
 Anesthesia Post Note  Patient: Printmaker  Procedure(s) Performed: MANIPULATION, JOINT, KNEE, WITH ANESTHESIA (Left: Knee)     Patient location during evaluation: PACU Anesthesia Type: General Level of consciousness: awake and alert Pain management: pain level controlled Vital Signs Assessment: post-procedure vital signs reviewed and stable Respiratory status: spontaneous breathing, nonlabored ventilation, respiratory function stable and patient connected to nasal cannula oxygen Cardiovascular status: blood pressure returned to baseline and stable Postop Assessment: no apparent nausea or vomiting Anesthetic complications: no   No notable events documented.  Last Vitals:  Vitals:   09/13/23 1445 09/13/23 1500  BP: (!) 152/76 (!) 146/67  Pulse: 95 87  Resp: 16 17  Temp:  36.8 C  SpO2: 96% 95%    Last Pain:  Vitals:   09/13/23 1423  TempSrc:   PainSc: 7                  Montrice Montuori L Anaeli Cornwall

## 2023-09-14 NOTE — Telephone Encounter (Signed)
 Patient called to see if she was able to drive. Per Dr Vernetta she is not to drive until she comes back for her post op next week

## 2023-09-15 ENCOUNTER — Ambulatory Visit (INDEPENDENT_AMBULATORY_CARE_PROVIDER_SITE_OTHER): Admitting: Rehabilitative and Restorative Service Providers"

## 2023-09-15 ENCOUNTER — Encounter: Payer: Self-pay | Admitting: Rehabilitative and Restorative Service Providers"

## 2023-09-15 DIAGNOSIS — M6281 Muscle weakness (generalized): Secondary | ICD-10-CM | POA: Diagnosis not present

## 2023-09-15 DIAGNOSIS — M25562 Pain in left knee: Secondary | ICD-10-CM

## 2023-09-15 DIAGNOSIS — R262 Difficulty in walking, not elsewhere classified: Secondary | ICD-10-CM | POA: Diagnosis not present

## 2023-09-15 DIAGNOSIS — R2689 Other abnormalities of gait and mobility: Secondary | ICD-10-CM

## 2023-09-15 DIAGNOSIS — M25662 Stiffness of left knee, not elsewhere classified: Secondary | ICD-10-CM | POA: Diagnosis not present

## 2023-09-15 DIAGNOSIS — G8929 Other chronic pain: Secondary | ICD-10-CM

## 2023-09-15 DIAGNOSIS — R2681 Unsteadiness on feet: Secondary | ICD-10-CM

## 2023-09-15 DIAGNOSIS — R600 Localized edema: Secondary | ICD-10-CM

## 2023-09-15 NOTE — Therapy (Addendum)
 OUTPATIENT PHYSICAL THERAPY TREATMENT    Patient Name: Patricia Hoover MRN: [id number] DOB:1945/08/04, 78 y.o., female Today's Date: 09/15/2023      END OF SESSION:  PT End of Session - 09/15/23 1055     Visit Number 9    Number of Visits 30    Date for PT Re-Evaluation 11/11/23    Authorization Type MCR    Progress Note Due on Visit 18    PT Start Time 1055    PT Stop Time 1150    PT Time Calculation (min) 55 min    Activity Tolerance Patient tolerated treatment well    Behavior During Therapy WFL for tasks assessed/performed               Past Medical History:  Diagnosis Date   Angioedema 06/15/2017   Arrhythmia    possible hx, resolved prev   Blood transfusion without reported diagnosis 1972   had reaction; had to stop   Carpal tunnel syndrome    Cataract 2019   resolved with surgery   Complication of anesthesia    takes a long to wake up   Duodenal ulcer    H/O exercise stress test 2012   normal   Heart murmur    as child   Hematochezia    High cholesterol    History of kidney stones    Hx of colonic polyps    Hypertension    Osteoporosis 2010   t score - 3.9   Peptic ulcer of duodenum    Renal stones    Vitamin D  deficiency 03/01/2021   Past Surgical History:  Procedure Laterality Date   ABDOMINAL HYSTERECTOMY  1972   for endometriosis   CATARACT EXTRACTION W/ INTRAOCULAR LENS IMPLANT Right 06/2017   CATARACT EXTRACTION W/ INTRAOCULAR LENS IMPLANT Left 07/2017   COLONOSCOPY Left 03/02/2017   Procedure: COLONOSCOPY;  Surgeon: Janalyn Keene NOVAK, MD;  Location: [facility] ENDOSCOPY;  Service: Endoscopy;  Laterality: Left;   COLONOSCOPY WITH PROPOFOL  N/A 05/04/2016   Procedure: COLONOSCOPY WITH PROPOFOL ;  Surgeon: Ruel Kung, MD;  Location: [facility] ENDOSCOPY;  Service: Endoscopy;  Laterality: N/A;   ESOPHAGOGASTRODUODENOSCOPY Left 03/02/2017   Procedure: ESOPHAGOGASTRODUODENOSCOPY (EGD);  Surgeon: Janalyn Keene NOVAK, MD;  Location: [facility] ENDOSCOPY;   Service: Endoscopy;  Laterality: Left;   ESOPHAGOGASTRODUODENOSCOPY (EGD) WITH PROPOFOL  N/A 04/08/2017   Procedure: ESOPHAGOGASTRODUODENOSCOPY (EGD) WITH PROPOFOL ;  Surgeon: Janalyn Keene NOVAK, MD;  Location: [facility] ENDOSCOPY;  Service: Endoscopy;  Laterality: N/A;   EXAM UNDER ANESTHESIA WITH MANIPULATION OF KNEE Left 09/13/2023   Procedure: MANIPULATION, JOINT, KNEE, WITH ANESTHESIA;  Surgeon: Addie Cordella Hamilton, MD;  Location: [facility] OR;  Service: Orthopedics;  Laterality: Left;   LITHOTRIPSY     for kidney stone x5   LITHOTRIPSY  09/2017   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   Procedure: ARTHROPLASTY, KNEE, TOTAL, LEFT;  Surgeon: Addie Cordella Hamilton, MD;  Location: [facility] OR;  Service: Orthopedics;  Laterality: Left;   Patient Active Problem List   Diagnosis Date Noted   Arthritis of left knee 07/26/2023   Lower GI bleeding 06/29/2023   Hypokalemia 03/09/2023   Aortic atherosclerosis (HCC) 03/09/2023   Unilateral primary osteoarthritis, left knee 11/26/2022   Dysuria 04/21/2022   Stiff-legged gait 04/21/2022   Scalp lesion 09/21/2021   Clavicle enlargement 04/05/2021   Hyperpigmentation 03/01/2021   Vitamin D  deficiency 03/01/2021   Leg pain 05/21/2019   Right leg pain 05/21/2019   GI bleed 03/04/2018   Healthcare maintenance 01/08/2018   Paresthesia 01/08/2018  Neck pain 08/31/2017   Angioedema 06/15/2017   External hemorrhoids    Diverticulosis of large intestine without diverticulitis    Internal hemorrhoids    Advance care planning 12/23/2016   Renal stone 04/20/2016   Heartburn 12/17/2015   Medicare annual wellness visit, subsequent 06/05/2013   Hyperglycemia 06/05/2013   Knee pain 08/31/2012   Age-related osteoporosis without current pathological fracture 08/18/2012   HTN (hypertension) 08/09/2012   HLD (hyperlipidemia) 08/09/2012    PCP: Cleatus Molly MD   REFERRING PROVIDER: Addie Cordella Hamilton, MD  REFERRING DIAG: Diagnosis [phone] (ICD-10-CM) - Chronic  pain of left knee M17.12 (ICD-10-CM) - Unilateral primary osteoarthritis, left knee  THERAPY DIAG:  Stiffness of left knee, not elsewhere classified  Chronic pain of left knee  Difficulty in walking, not elsewhere classified  Muscle weakness (generalized)  Other abnormalities of gait and mobility  Localized edema  Unsteadiness on feet  Rationale for Evaluation and Treatment: Rehabilitation  ONSET DATE: Lt TKR 07/26/23  SUBJECTIVE:   SUBJECTIVE STATEMENT:  Patient overall doing well. She reports some swelling due to using bending machine at home.   PERTINENT HISTORY: Left TKA 07/26/2023, OA, osteoporosis, heart murmur, HTN  PAIN:  NPRS scale: 5/10 when walking, tightness in resting  Pain location: Lt knee  Pain description:  stiffness, tightness.  Aggravating factors: bending it  Relieving factors: nothing besides pain pills   PRECAUTIONS: None  RED FLAGS: None   WEIGHT BEARING RESTRICTIONS: No  FALLS:  Has patient fallen in last 6 months? No  LIVING ENVIRONMENT: Lives with: lives with their family Lives in: House/apartment Stairs:  flight inside but does not have to go up  Has following equipment at home: Vannie - 2 wheeled  OCCUPATION: retiredWater engineer at an [employer]   PLOF: Independent, Independent with basic ADLs, Independent with gait, and Independent with transfers  PATIENT GOALS: be able to line dance   NEXT MD VISIT: Referring 09/23/2023  OBJECTIVE:  Note: Objective measures were completed at Evaluation unless otherwise noted.  PATIENT SURVEYS:  PSFS: THE PATIENT SPECIFIC FUNCTIONAL SCALE  Place score of 0-10 (0 = unable to perform activity and 10 = able to perform activity at the same level as before injury or problem)  Activity Date: 08/12/23 09/14/23    Bending knee  5 1   2. Lifting leg  5 1   3.walking  2   4. Standing for ADLs  2   Total Score 5 1.5     Total Score = Sum of activity scores/number of activities  Minimally  Detectable Change: 3 points (for single activity); 2 points (for average score)   COGNITION: 08/12/2023 Overall cognitive status: Within functional limits for tasks assessed     SENSATION: 08/12/2023 [facility]  EDEMA:  08/12/2023 Localized edema appropriate for post-op state    LOWER EXTREMITY ROM:  Active ROM Left eval Left  08/22/23 Left  08/24/23 Left 09/01/23 Left  09/14/23  Hip flexion       Hip extension       Hip abduction       Hip adduction       Hip internal rotation       Hip external rotation       Knee flexion 67 64* 73* 62 in supine AROM heel slide  721 in PROM heel slide Supine 90/90 position A: 68*  Knee extension -9 -5* -5* -8 in seated AROM LAQ  Supine -9*  Ankle dorsiflexion       Ankle plantarflexion  Ankle inversion       Ankle eversion        (Blank rows = not tested)  LOWER EXTREMITY MMT:  MMT Left Eval 08/12/2023 Left 09/14/23  Hip flexion    Hip extension    Hip abduction    Hip adduction    Hip internal rotation    Hip external rotation    Knee flexion 3+ 3-/5  Knee extension 3+ 3-/5  Ankle dorsiflexion    Ankle plantarflexion    Ankle inversion    Ankle eversion     (Blank rows = not tested)    GAIT: 08/30/2023: Ambulation in clinic c SPC in Rt UE with SBA.  Reduced knee extension in stance, maintaining knee flexion.   08/12/2023 Distance walked: in clinic distances  Assistive device utilized: Environmental consultant - 2 wheeled Level of assistance: Modified independence Comments: antalgic, limited knee ROM during gait cycle                  TREATMENT       DATE: 09/15/2023 TherEx:  SciFit seat 6, level 1, full fwd rotation after 30 seconds, for a total of 6 minutes  Supine 90/90 position with gravity assist knee flexion with active knee extension as well, 3x6 Heel prop for knee extension, 5x10 seconds due to pain for extended period time  Seated towel AAROM knee flexion/extension, 1x10 Glute bridhe MWM, 2x9  Vaso Lt LE elevated, medium  compression, 34 degrees for 10 minutes    TREATMENT       DATE: 09/14/2023 TherEx:  Working on functional range: SciFit, seat 6 for first 4 minutes with partial circles, seat 5 for 5 minutes and was able to complete full backward circles with some manual assistance from SPT for 3 revolutions (pt requested to stop due to pain) Supine heel slides with strap and ball, 2x10.  Patient had a difficult time bending the knee and understanding the exercises. SPT tactile & verbal cues for technique Supine 90/90 position with gravity assist knee flexion. Knee flexion to 68 degrees.  Pt guarding with anxiety and fear of pain.   Manual Seated knee flexion overpressure  Distraction, IR, and overpressure knee flexion with contract,relax technique   Vaso Lt LE elevated, medium compression, 34 degrees for 10 minutes    TREATMENT       DATE: 09/08/2023 TherEx:  Nustep level 2 for ROM gains with UE assist for 8 minutes  Discussed leg press variations, regressions, and progressions, as well as pain vs discomfort   TherAct Step up with 6 step practicing up with Rt and down with Lt as well as 1x10 leading with Lt up Step down with 4 step 1x2 ; discontinued as patient with minimal knee flexion and performing with compensatory trunk lean and hip hike  Bilat leg press 2x8 with 75# in available range   Manual  Lt LE on 6 step with max knee flexion, PT providing distraction, 6x for 10s  Vaso:  Left knee on wedge for 10 minutes with medium compression at 34deg    PATIENT EDUCATION:  Education details: exam findings, POC, course of care following TKR  Person educated: Patient Education method: Explanation and Demonstration Education comprehension: verbalized understanding, returned demonstration, and needs further education  HOME EXERCISE PROGRAM:  Access Code: [email or web address] URL: [email or web address] Date: 08/22/2023 Prepared by: Lamar Ivory  Exercises - Seated Knee Flexion AAROM  - 5  x daily - 7 x weekly - 1 sets - 10 reps - 10 seconds hold -  Supine Quadricep Sets  - 5 x daily - 7 x weekly - 2 sets - 10 reps - 5 second hold - Supine Heel Slide with Strap  - 5 x daily - 7 x weekly - 1 sets - 10 reps - 10 seconds hold  ASSESSMENT:  CLINICAL IMPRESSION: Patient tolerated treatment well today. Patient's knee flexion ROM has progressed as seen by 6 minutes of fwd rotation on the bike. Patient has limited knee extension and has high levels of pain when doing exercises for knee extension ROM gains. Patient understand the importance of coming in everyday and working at home for ROM gains. Patient will continue to benefit from skilled PT to address impairments and maximize ROM.  OBJECTIVE IMPAIRMENTS: Abnormal gait, decreased activity tolerance, decreased balance, decreased knowledge of use of DME, decreased mobility, difficulty walking, decreased ROM, decreased strength, hypomobility, increased muscle spasms, impaired flexibility, and pain.   ACTIVITY LIMITATIONS: sitting, standing, squatting, sleeping, stairs, transfers, bed mobility, and locomotion level  PARTICIPATION LIMITATIONS: driving, shopping, community activity, yard work, and church  PERSONAL FACTORS: Age, Behavior pattern, Education, Fitness, Past/current experiences, Social background, and Time since onset of injury/illness/exacerbation are also affecting patient's functional outcome.   REHAB POTENTIAL: Good  CLINICAL DECISION MAKING: Stable/uncomplicated  EVALUATION COMPLEXITY: Low   GOALS: Goals reviewed with patient? No  SHORT TERM GOALS: Target date: 10/07/2023   Will be compliant with appropriate progressive HEP Goal status: updated, 09/14/2023  2. Lt knee ROM 5-85* Goal status: updated, 09/14/2023  3. Will be independent with edema management strategies  Goal status: updated, 09/14/2023   LONG TERM GOALS: Target date: 11/11/2023   Patient will demonstrate Lt knee MMT WFL throughout to faciltiate  usual transfers, stairs, squatting at PLOF for daily life.  Goal status: updated, 09/14/2023  2. L Knee flexion AROM -5 to 90* Goal status: updated, 09/14/2023  3. Will be able to ascend and descend stairs reciprocally with no increase in pain Goal status: updated, 09/14/2023  4. Will be able to ambulate community distances and perform all household tasks with no increase in pain w/ LRAD  Goal status: updated, 09/14/2023  5.  Patient will demonstrate Patient specific functional scale >5 to indicate reduced disability due to condition.  Goal status: updated, 09/14/2023  6. L Knee flexion PROM -2 to 100* Goal status: updated, 09/14/2023   PLAN:  PT FREQUENCY:  4-5x/week for 2 weeks then 2-3x/week for 6 weeks  PT DURATION: 8 weeks  PLANNED INTERVENTIONS: 97750- Physical Performance Testing, 97110-Therapeutic exercises, 97530- Therapeutic activity, 97112- Neuromuscular re-education, 97535- Self Care, [id number]- Manual therapy, 97116- Gait training, and 97016- Vasopneumatic device  PLAN FOR NEXT SESSION: monitor pain levels during session with goals of range gains.    Ismael Nap, Student-PT 09/15/2023, 4:16 PM

## 2023-09-16 ENCOUNTER — Ambulatory Visit (INDEPENDENT_AMBULATORY_CARE_PROVIDER_SITE_OTHER)

## 2023-09-16 DIAGNOSIS — G8929 Other chronic pain: Secondary | ICD-10-CM

## 2023-09-16 DIAGNOSIS — M25662 Stiffness of left knee, not elsewhere classified: Secondary | ICD-10-CM

## 2023-09-16 DIAGNOSIS — R600 Localized edema: Secondary | ICD-10-CM

## 2023-09-16 DIAGNOSIS — M25562 Pain in left knee: Secondary | ICD-10-CM | POA: Diagnosis not present

## 2023-09-16 DIAGNOSIS — R2689 Other abnormalities of gait and mobility: Secondary | ICD-10-CM

## 2023-09-16 DIAGNOSIS — M6281 Muscle weakness (generalized): Secondary | ICD-10-CM | POA: Diagnosis not present

## 2023-09-16 DIAGNOSIS — R262 Difficulty in walking, not elsewhere classified: Secondary | ICD-10-CM

## 2023-09-16 NOTE — Therapy (Signed)
 OUTPATIENT PHYSICAL THERAPY TREATMENT    Patient Name: Patricia Hoover MRN: [id number] DOB:03-15-1945, 78 y.o., female Today's Date: 09/16/2023      END OF SESSION:  PT End of Session - 09/16/23 1101     Visit Number 10    Number of Visits 30    Date for PT Re-Evaluation 11/11/23    Authorization Type MCR    Progress Note Due on Visit 18    PT Start Time 1101    PT Stop Time 1152    PT Time Calculation (min) 51 min    Activity Tolerance Patient tolerated treatment well    Behavior During Therapy WFL for tasks assessed/performed               Past Medical History:  Diagnosis Date   Angioedema 06/15/2017   Arrhythmia    possible hx, resolved prev   Blood transfusion without reported diagnosis 1972   had reaction; had to stop   Carpal tunnel syndrome    Cataract 2019   resolved with surgery   Complication of anesthesia    takes a long to wake up   Duodenal ulcer    H/O exercise stress test 2012   normal   Heart murmur    as child   Hematochezia    High cholesterol    History of kidney stones    Hx of colonic polyps    Hypertension    Osteoporosis 2010   t score - 3.9   Peptic ulcer of duodenum    Renal stones    Vitamin D  deficiency 03/01/2021   Past Surgical History:  Procedure Laterality Date   ABDOMINAL HYSTERECTOMY  1972   for endometriosis   CATARACT EXTRACTION W/ INTRAOCULAR LENS IMPLANT Right 06/2017   CATARACT EXTRACTION W/ INTRAOCULAR LENS IMPLANT Left 07/2017   COLONOSCOPY Left 03/02/2017   Procedure: COLONOSCOPY;  Surgeon: Janalyn Keene NOVAK, MD;  Location: [facility] ENDOSCOPY;  Service: Endoscopy;  Laterality: Left;   COLONOSCOPY WITH PROPOFOL  N/A 05/04/2016   Procedure: COLONOSCOPY WITH PROPOFOL ;  Surgeon: Ruel Kung, MD;  Location: [facility] ENDOSCOPY;  Service: Endoscopy;  Laterality: N/A;   ESOPHAGOGASTRODUODENOSCOPY Left 03/02/2017   Procedure: ESOPHAGOGASTRODUODENOSCOPY (EGD);  Surgeon: Janalyn Keene NOVAK, MD;  Location: [facility] ENDOSCOPY;   Service: Endoscopy;  Laterality: Left;   ESOPHAGOGASTRODUODENOSCOPY (EGD) WITH PROPOFOL  N/A 04/08/2017   Procedure: ESOPHAGOGASTRODUODENOSCOPY (EGD) WITH PROPOFOL ;  Surgeon: Janalyn Keene NOVAK, MD;  Location: [facility] ENDOSCOPY;  Service: Endoscopy;  Laterality: N/A;   EXAM UNDER ANESTHESIA WITH MANIPULATION OF KNEE Left 09/13/2023   Procedure: MANIPULATION, JOINT, KNEE, WITH ANESTHESIA;  Surgeon: Addie Cordella Hamilton, MD;  Location: [facility] OR;  Service: Orthopedics;  Laterality: Left;   LITHOTRIPSY     for kidney stone x5   LITHOTRIPSY  09/2017   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   Procedure: ARTHROPLASTY, KNEE, TOTAL, LEFT;  Surgeon: Addie Cordella Hamilton, MD;  Location: [facility] OR;  Service: Orthopedics;  Laterality: Left;   Patient Active Problem List   Diagnosis Date Noted   Arthritis of left knee 07/26/2023   Lower GI bleeding 06/29/2023   Hypokalemia 03/09/2023   Aortic atherosclerosis (HCC) 03/09/2023   Unilateral primary osteoarthritis, left knee 11/26/2022   Dysuria 04/21/2022   Stiff-legged gait 04/21/2022   Scalp lesion 09/21/2021   Clavicle enlargement 04/05/2021   Hyperpigmentation 03/01/2021   Vitamin D  deficiency 03/01/2021   Leg pain 05/21/2019   Right leg pain 05/21/2019   GI bleed 03/04/2018   Healthcare maintenance 01/08/2018   Paresthesia 01/08/2018  Neck pain 08/31/2017   Angioedema 06/15/2017   External hemorrhoids    Diverticulosis of large intestine without diverticulitis    Internal hemorrhoids    Advance care planning 12/23/2016   Renal stone 04/20/2016   Heartburn 12/17/2015   Medicare annual wellness visit, subsequent 06/05/2013   Hyperglycemia 06/05/2013   Knee pain 08/31/2012   Age-related osteoporosis without current pathological fracture 08/18/2012   HTN (hypertension) 08/09/2012   HLD (hyperlipidemia) 08/09/2012    PCP: Cleatus Molly MD   REFERRING PROVIDER: Addie Cordella Hamilton, MD  REFERRING DIAG: Diagnosis [phone] (ICD-10-CM) - Chronic  pain of left knee M17.12 (ICD-10-CM) - Unilateral primary osteoarthritis, left knee  THERAPY DIAG:  Stiffness of left knee, not elsewhere classified  Chronic pain of left knee  Difficulty in walking, not elsewhere classified  Muscle weakness (generalized)  Other abnormalities of gait and mobility  Localized edema  Rationale for Evaluation and Treatment: Rehabilitation  ONSET DATE: Lt TKR 07/26/23  SUBJECTIVE:   SUBJECTIVE STATEMENT:   I feel stiff every time after using that machine. Patient endorsing performing the CPM 3-4x/day for 2 hours at a time. Also seldomly having patient that shoots up the leg.   PERTINENT HISTORY: Left TKA 07/26/2023, OA, osteoporosis, heart murmur, HTN  PAIN:  NPRS scale: 5/10 when walking, tightness in resting  Pain location: Lt knee  Pain description:  stiffness, tightness.  Aggravating factors: bending it  Relieving factors: nothing besides pain pills   PRECAUTIONS: None  RED FLAGS: None   WEIGHT BEARING RESTRICTIONS: No  FALLS:  Has patient fallen in last 6 months? No  LIVING ENVIRONMENT: Lives with: lives with their family Lives in: House/apartment Stairs:  flight inside but does not have to go up  Has following equipment at home: Vannie - 2 wheeled  OCCUPATION: retiredWater engineer at an [employer]   PLOF: Independent, Independent with basic ADLs, Independent with gait, and Independent with transfers  PATIENT GOALS: be able to line dance   NEXT MD VISIT: Referring 09/23/2023  OBJECTIVE:  Note: Objective measures were completed at Evaluation unless otherwise noted.  PATIENT SURVEYS:  PSFS: THE PATIENT SPECIFIC FUNCTIONAL SCALE  Place score of 0-10 (0 = unable to perform activity and 10 = able to perform activity at the same level as before injury or problem)  Activity Date: 08/12/23 09/14/23    Bending knee  5 1   2. Lifting leg  5 1   3.walking  2   4. Standing for ADLs  2   Total Score 5 1.5     Total Score =  Sum of activity scores/number of activities  Minimally Detectable Change: 3 points (for single activity); 2 points (for average score)   COGNITION: 08/12/2023 Overall cognitive status: Within functional limits for tasks assessed     SENSATION: 08/12/2023 [facility]  EDEMA:  08/12/2023 Localized edema appropriate for post-op state    LOWER EXTREMITY ROM:  Active ROM Left eval Left  08/22/23 Left  08/24/23 Left 09/01/23 Left  09/14/23  Hip flexion       Hip extension       Hip abduction       Hip adduction       Hip internal rotation       Hip external rotation       Knee flexion 67 64* 73* 62 in supine AROM heel slide  721 in PROM heel slide Supine 90/90 position A: 68*  Knee extension -9 -5* -5* -8 in seated AROM LAQ  Supine -  9*  Ankle dorsiflexion       Ankle plantarflexion       Ankle inversion       Ankle eversion        (Blank rows = not tested)  LOWER EXTREMITY MMT:  MMT Left Eval 08/12/2023 Left 09/14/23  Hip flexion    Hip extension    Hip abduction    Hip adduction    Hip internal rotation    Hip external rotation    Knee flexion 3+ 3-/5  Knee extension 3+ 3-/5  Ankle dorsiflexion    Ankle plantarflexion    Ankle inversion    Ankle eversion     (Blank rows = not tested)    GAIT: 08/30/2023: Ambulation in clinic c SPC in Rt UE with SBA.  Reduced knee extension in stance, maintaining knee flexion.   08/12/2023 Distance walked: in clinic distances  Assistive device utilized: Environmental consultant - 2 wheeled Level of assistance: Modified independence Comments: antalgic, limited knee ROM during gait cycle                 TREATMENT       DATE: 09/16/2023 TherEx:  UBE with bilat UE and LE full fwd rotations throughout; level 1 for 6 minutes   Requiring square liner to decrease sliding as well as verbal cues for decreased hip movement with improved performance after a few minutes  Mini squats in parallel bars with focus on knee flexion 2x8 using bilat UE support to  increase knee flexion Quad sets with foam roller under ankle to increase knee extension 2x10 with 5s holds  Discussed CPM, importance of active movement vs passive, and HEP   Manual:  Knee flexion with foot on 4 step and PT providing distraction 10x10s   Vaso:  Lt LE elevated on wedge, medium compression for 10 minutes    TREATMENT       DATE: 09/15/2023 TherEx:  SciFit seat 6, level 1, full fwd rotation after 30 seconds, for a total of 6 minutes  Supine 90/90 position with gravity assist knee flexion with active knee extension as well, 3x6 Heel prop for knee extension, 5x10 seconds due to pain for extended period time  Seated towel AAROM knee flexion/extension, 1x10 Glute bridhe MWM, 2x9  Vaso Lt LE elevated, medium compression, 34 degrees for 10 minutes    TREATMENT       DATE: 09/14/2023 TherEx:  Working on functional range: SciFit, seat 6 for first 4 minutes with partial circles, seat 5 for 5 minutes and was able to complete full backward circles with some manual assistance from SPT for 3 revolutions (pt requested to stop due to pain) Supine heel slides with strap and ball, 2x10.  Patient had a difficult time bending the knee and understanding the exercises. SPT tactile & verbal cues for technique Supine 90/90 position with gravity assist knee flexion. Knee flexion to 68 degrees.  Pt guarding with anxiety and fear of pain.   Manual Seated knee flexion overpressure  Distraction, IR, and overpressure knee flexion with contract,relax technique   Vaso Lt LE elevated, medium compression, 34 degrees for 10 minutes    TREATMENT       DATE: 09/08/2023 TherEx:  Nustep level 2 for ROM gains with UE assist for 8 minutes  Discussed leg press variations, regressions, and progressions, as well as pain vs discomfort   TherAct Step up with 6 step practicing up with Rt and down with Lt as well as 1x10 leading with Lt  up Step down with 4 step 1x2 ; discontinued as patient with minimal  knee flexion and performing with compensatory trunk lean and hip hike  Bilat leg press 2x8 with 75# in available range   Manual  Lt LE on 6 step with max knee flexion, PT providing distraction, 6x for 10s  Vaso:  Left knee on wedge for 10 minutes with medium compression at 34deg    PATIENT EDUCATION:  Education details: exam findings, POC, course of care following TKR  Person educated: Patient Education method: Explanation and Demonstration Education comprehension: verbalized understanding, returned demonstration, and needs further education  HOME EXERCISE PROGRAM:  Access Code: [email or web address] URL: [email or web address] Date: 08/22/2023 Prepared by: Lamar Ivory  Exercises - Seated Knee Flexion AAROM  - 5 x daily - 7 x weekly - 1 sets - 10 reps - 10 seconds hold - Supine Quadricep Sets  - 5 x daily - 7 x weekly - 2 sets - 10 reps - 5 second hold - Supine Heel Slide with Strap  - 5 x daily - 7 x weekly - 1 sets - 10 reps - 10 seconds hold  ASSESSMENT:  CLINICAL IMPRESSION:  Patient arrived to session noting increased stiffness and soreness in Lt knee and demonstrating decreased knee flexion throughout swing phase of gait. Patient endorses difficulty with stiffness in the morning and following use of CPM. PT discussed potential decrease in use of CPM to 2 times per day instead of 4 in order to increase time to perform active movement. Patient tolerated all activities well with no notes of increased pain or soreness. Patient will continue to benefit from skilled PT.   OBJECTIVE IMPAIRMENTS: Abnormal gait, decreased activity tolerance, decreased balance, decreased knowledge of use of DME, decreased mobility, difficulty walking, decreased ROM, decreased strength, hypomobility, increased muscle spasms, impaired flexibility, and pain.   ACTIVITY LIMITATIONS: sitting, standing, squatting, sleeping, stairs, transfers, bed mobility, and locomotion level  PARTICIPATION LIMITATIONS:  driving, shopping, community activity, yard work, and church  PERSONAL FACTORS: Age, Behavior pattern, Education, Fitness, Past/current experiences, Social background, and Time since onset of injury/illness/exacerbation are also affecting patient's functional outcome.   REHAB POTENTIAL: Good  CLINICAL DECISION MAKING: Stable/uncomplicated  EVALUATION COMPLEXITY: Low   GOALS: Goals reviewed with patient? No  SHORT TERM GOALS: Target date: 10/07/2023   Will be compliant with appropriate progressive HEP Goal status: updated, 09/14/2023  2. Lt knee ROM 5-85* Goal status: updated, 09/14/2023  3. Will be independent with edema management strategies  Goal status: updated, 09/14/2023   LONG TERM GOALS: Target date: 11/11/2023   Patient will demonstrate Lt knee MMT WFL throughout to faciltiate usual transfers, stairs, squatting at PLOF for daily life.  Goal status: updated, 09/14/2023  2. L Knee flexion AROM -5 to 90* Goal status: updated, 09/14/2023  3. Will be able to ascend and descend stairs reciprocally with no increase in pain Goal status: updated, 09/14/2023  4. Will be able to ambulate community distances and perform all household tasks with no increase in pain w/ LRAD  Goal status: updated, 09/14/2023  5.  Patient will demonstrate Patient specific functional scale >5 to indicate reduced disability due to condition.  Goal status: updated, 09/14/2023  6. L Knee flexion PROM -2 to 100* Goal status: updated, 09/14/2023   PLAN:  PT FREQUENCY:  4-5x/week for 2 weeks then 2-3x/week for 6 weeks  PT DURATION: 8 weeks  PLANNED INTERVENTIONS: 97750- Physical Performance Testing, 97110-Therapeutic exercises, 97530- Therapeutic activity, [id number]- Neuromuscular re-education, 97535- Self Care, [id number]-  Manual therapy, [id number]- Gait training, and [id number]- Vasopneumatic device  PLAN FOR NEXT SESSION: monitor pain levels during session with goals of range gains.    Susannah Daring, PT,  DPT 09/16/23 12:12 PM

## 2023-09-18 DIAGNOSIS — M24662 Ankylosis, left knee: Secondary | ICD-10-CM

## 2023-09-20 ENCOUNTER — Ambulatory Visit (INDEPENDENT_AMBULATORY_CARE_PROVIDER_SITE_OTHER): Admitting: Physical Therapy

## 2023-09-20 ENCOUNTER — Encounter: Payer: Self-pay | Admitting: Physical Therapy

## 2023-09-20 DIAGNOSIS — M25562 Pain in left knee: Secondary | ICD-10-CM | POA: Diagnosis not present

## 2023-09-20 DIAGNOSIS — M25662 Stiffness of left knee, not elsewhere classified: Secondary | ICD-10-CM

## 2023-09-20 DIAGNOSIS — R2689 Other abnormalities of gait and mobility: Secondary | ICD-10-CM | POA: Diagnosis not present

## 2023-09-20 DIAGNOSIS — R600 Localized edema: Secondary | ICD-10-CM | POA: Diagnosis not present

## 2023-09-20 DIAGNOSIS — G8929 Other chronic pain: Secondary | ICD-10-CM

## 2023-09-20 DIAGNOSIS — R262 Difficulty in walking, not elsewhere classified: Secondary | ICD-10-CM | POA: Diagnosis not present

## 2023-09-20 DIAGNOSIS — M6281 Muscle weakness (generalized): Secondary | ICD-10-CM | POA: Diagnosis not present

## 2023-09-20 DIAGNOSIS — R2681 Unsteadiness on feet: Secondary | ICD-10-CM

## 2023-09-20 NOTE — Therapy (Signed)
 OUTPATIENT PHYSICAL THERAPY TREATMENT    Patient Name: Patricia Hoover MRN: [id number] DOB:02/17/1945, 78 y.o., female Today's Date: 09/20/2023      END OF SESSION:  PT End of Session - 09/20/23 1105     Visit Number 11    Number of Visits 30    Date for PT Re-Evaluation 11/11/23    Authorization Type MCR    Authorization Time Period 08/12/23 to 10/07/23    Progress Note Due on Visit 18    PT Start Time 1015    PT Stop Time 1110    PT Time Calculation (min) 55 min    Activity Tolerance Patient tolerated treatment well;Patient limited by pain    Behavior During Therapy [facility] for tasks assessed/performed                Past Medical History:  Diagnosis Date   Angioedema 06/15/2017   Arrhythmia    possible hx, resolved prev   Blood transfusion without reported diagnosis 1972   had reaction; had to stop   Carpal tunnel syndrome    Cataract 2019   resolved with surgery   Complication of anesthesia    takes a long to wake up   Duodenal ulcer    H/O exercise stress test 2012   normal   Heart murmur    as child   Hematochezia    High cholesterol    History of kidney stones    Hx of colonic polyps    Hypertension    Osteoporosis 2010   t score - 3.9   Peptic ulcer of duodenum    Renal stones    Vitamin D  deficiency 03/01/2021   Past Surgical History:  Procedure Laterality Date   ABDOMINAL HYSTERECTOMY  1972   for endometriosis   CATARACT EXTRACTION W/ INTRAOCULAR LENS IMPLANT Right 06/2017   CATARACT EXTRACTION W/ INTRAOCULAR LENS IMPLANT Left 07/2017   COLONOSCOPY Left 03/02/2017   Procedure: COLONOSCOPY;  Surgeon: Janalyn Keene NOVAK, MD;  Location: [facility] ENDOSCOPY;  Service: Endoscopy;  Laterality: Left;   COLONOSCOPY WITH PROPOFOL  N/A 05/04/2016   Procedure: COLONOSCOPY WITH PROPOFOL ;  Surgeon: Ruel Kung, MD;  Location: [facility] ENDOSCOPY;  Service: Endoscopy;  Laterality: N/A;   ESOPHAGOGASTRODUODENOSCOPY Left 03/02/2017   Procedure: ESOPHAGOGASTRODUODENOSCOPY  (EGD);  Surgeon: Janalyn Keene NOVAK, MD;  Location: [facility] ENDOSCOPY;  Service: Endoscopy;  Laterality: Left;   ESOPHAGOGASTRODUODENOSCOPY (EGD) WITH PROPOFOL  N/A 04/08/2017   Procedure: ESOPHAGOGASTRODUODENOSCOPY (EGD) WITH PROPOFOL ;  Surgeon: Janalyn Keene NOVAK, MD;  Location: [facility] ENDOSCOPY;  Service: Endoscopy;  Laterality: N/A;   EXAM UNDER ANESTHESIA WITH MANIPULATION OF KNEE Left 09/13/2023   Procedure: MANIPULATION, JOINT, KNEE, WITH ANESTHESIA;  Surgeon: Addie Cordella Hamilton, MD;  Location: [facility] OR;  Service: Orthopedics;  Laterality: Left;   LITHOTRIPSY     for kidney stone x5   LITHOTRIPSY  09/2017   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   Procedure: ARTHROPLASTY, KNEE, TOTAL, LEFT;  Surgeon: Addie Cordella Hamilton, MD;  Location: [facility] OR;  Service: Orthopedics;  Laterality: Left;   Patient Active Problem List   Diagnosis Date Noted   Arthrofibrosis of knee joint, left 09/18/2023   Arthritis of left knee 07/26/2023   Lower GI bleeding 06/29/2023   Hypokalemia 03/09/2023   Aortic atherosclerosis (HCC) 03/09/2023   Unilateral primary osteoarthritis, left knee 11/26/2022   Dysuria 04/21/2022   Stiff-legged gait 04/21/2022   Scalp lesion 09/21/2021   Clavicle enlargement 04/05/2021   Hyperpigmentation 03/01/2021   Vitamin D  deficiency 03/01/2021   Leg pain 05/21/2019  Right leg pain 05/21/2019   GI bleed 03/04/2018   Healthcare maintenance 01/08/2018   Paresthesia 01/08/2018   Neck pain 08/31/2017   Angioedema 06/15/2017   External hemorrhoids    Diverticulosis of large intestine without diverticulitis    Internal hemorrhoids    Advance care planning 12/23/2016   Renal stone 04/20/2016   Heartburn 12/17/2015   Medicare annual wellness visit, subsequent 06/05/2013   Hyperglycemia 06/05/2013   Knee pain 08/31/2012   Age-related osteoporosis without current pathological fracture 08/18/2012   HTN (hypertension) 08/09/2012   HLD (hyperlipidemia) 08/09/2012    PCP: Cleatus Molly MD   REFERRING PROVIDER: Addie Cordella Hamilton, MD  REFERRING DIAG: Diagnosis [phone] (ICD-10-CM) - Chronic pain of left knee M17.12 (ICD-10-CM) - Unilateral primary osteoarthritis, left knee  THERAPY DIAG:  Stiffness of left knee, not elsewhere classified  Chronic pain of left knee  Difficulty in walking, not elsewhere classified  Muscle weakness (generalized)  Other abnormalities of gait and mobility  Localized edema  Unsteadiness on feet  Rationale for Evaluation and Treatment: Rehabilitation  ONSET DATE: Lt TKR 07/26/23  SUBJECTIVE:   SUBJECTIVE STATEMENT:   I just feel so stiff after I get off that machine Pt stating she has dropped her time to only 2 hours each day.   PERTINENT HISTORY: Left TKA 07/26/2023, OA, osteoporosis, heart murmur, HTN  PAIN:  NPRS scale: 5/10, pt 9/10 briefly during manual over pressure  Pain location: Lt knee  Pain description:  stiffness, tightness.  Aggravating factors: bending it  Relieving factors: nothing besides pain pills   PRECAUTIONS: None  RED FLAGS: None   WEIGHT BEARING RESTRICTIONS: No  FALLS:  Has patient fallen in last 6 months? No  LIVING ENVIRONMENT: Lives with: lives with their family Lives in: House/apartment Stairs:  flight inside but does not have to go up  Has following equipment at home: Vannie - 2 wheeled  OCCUPATION: retiredWater engineer at an [employer]   PLOF: Independent, Independent with basic ADLs, Independent with gait, and Independent with transfers  PATIENT GOALS: be able to line dance   NEXT MD VISIT: Referring 09/23/2023  OBJECTIVE:  Note: Objective measures were completed at Evaluation unless otherwise noted.  PATIENT SURVEYS:  PSFS: THE PATIENT SPECIFIC FUNCTIONAL SCALE  Place score of 0-10 (0 = unable to perform activity and 10 = able to perform activity at the same level as before injury or problem)  Activity Date: 08/12/23 09/14/23    Bending knee  5 1   2.  Lifting leg  5 1   3.walking  2   4. Standing for ADLs  2   Total Score 5 1.5     Total Score = Sum of activity scores/number of activities  Minimally Detectable Change: 3 points (for single activity); 2 points (for average score)   COGNITION: 08/12/2023 Overall cognitive status: Within functional limits for tasks assessed     SENSATION: 08/12/2023 [facility]  EDEMA:  08/12/2023 Localized edema appropriate for post-op state    LOWER EXTREMITY ROM:  Active ROM Left eval Left  08/22/23 Left  08/24/23 Left 09/01/23 Left  09/14/23 Left 09/20/23  Hip flexion        Hip extension        Hip abduction        Hip adduction        Hip internal rotation        Hip external rotation        Knee flexion 67 64* 73* 62 in  supine AROM heel slide  721 in PROM heel slide Supine 90/90 position A: 68* Supine A: 60 P: 65 Sitting:  P: 70  Knee extension -9 -5* -5* -8 in seated AROM LAQ  Supine -9* Supine P: -5  Ankle dorsiflexion        Ankle plantarflexion        Ankle inversion        Ankle eversion         (Blank rows = not tested)  LOWER EXTREMITY MMT:  MMT Left Eval 08/12/2023 Left 09/14/23  Hip flexion    Hip extension    Hip abduction    Hip adduction    Hip internal rotation    Hip external rotation    Knee flexion 3+ 3-/5  Knee extension 3+ 3-/5  Ankle dorsiflexion    Ankle plantarflexion    Ankle inversion    Ankle eversion     (Blank rows = not tested)    GAIT: 08/30/2023: Ambulation in clinic c SPC in Rt UE with SBA.  Reduced knee extension in stance, maintaining knee flexion.   08/12/2023 Distance walked: in clinic distances  Assistive device utilized: Environmental consultant - 2 wheeled Level of assistance: Modified independence Comments: antalgic, limited knee ROM during gait cycle                 TREATMENT       DATE: 09/20/2023 TherEx:  NMR:  Quad sets: x 30 holding 5-10 sec SAQ: working on left VMO activation x 30 holding 5-10 sec Seated SLR: working on holding quad  activation and preventing knee lag 2 x 10  Sit to stand 2 x 10 working on knee extension and qlute squeezes Manual:  Overpressure with quad sets to pt's tolerance Sitting knee flexion c IR and distraction as tolerated Sitting knee flexion with pulls from strap with gradual progression to reach 70 degrees flexion on left knee Vasopneumatic; Medium compression, 34 deg x 10 minutes, LE elevated   TREATMENT       DATE: 09/16/2023 TherEx:  UBE with bilat UE and LE full fwd rotations throughout; level 1 for 6 minutes   Requiring square liner to decrease sliding as well as verbal cues for decreased hip movement with improved performance after a few minutes  Mini squats in parallel bars with focus on knee flexion 2x8 using bilat UE support to increase knee flexion Quad sets with foam roller under ankle to increase knee extension 2x10 with 5s holds  Discussed CPM, importance of active movement vs passive, and HEP   Manual:  Knee flexion with foot on 4 step and PT providing distraction 10x10s   Vaso:  Lt LE elevated on wedge, medium compression for 10 minutes    TREATMENT       DATE: 09/15/2023 TherEx:  SciFit seat 6, level 1, full fwd rotation after 30 seconds, for a total of 6 minutes  Supine 90/90 position with gravity assist knee flexion with active knee extension as well, 3x6 Heel prop for knee extension, 5x10 seconds due to pain for extended period time  Seated towel AAROM knee flexion/extension, 1x10 Glute bridhe MWM, 2x9  Vaso Lt LE elevated, medium compression, 34 degrees for 10 minutes    TREATMENT       DATE: 09/14/2023 TherEx:  Working on functional range: SciFit, seat 6 for first 4 minutes with partial circles, seat 5 for 5 minutes and was able to complete full backward circles with some manual assistance from SPT for 3  revolutions (pt requested to stop due to pain) Supine heel slides with strap and ball, 2x10.  Patient had a difficult time bending the knee and understanding  the exercises. SPT tactile & verbal cues for technique Supine 90/90 position with gravity assist knee flexion. Knee flexion to 68 degrees.  Pt guarding with anxiety and fear of pain.   Manual Seated knee flexion overpressure  Distraction, IR, and overpressure knee flexion with contract,relax technique   Vaso Lt LE elevated, medium compression, 34 degrees for 10 minutes      PATIENT EDUCATION:  Education details: exam findings, POC, course of care following TKR  Person educated: Patient Education method: Explanation and Demonstration Education comprehension: verbalized understanding, returned demonstration, and needs further education  HOME EXERCISE PROGRAM:  Access Code: [email or web address] URL: [email or web address] Date: 08/22/2023 Prepared by: Lamar Ivory  Exercises - Seated Knee Flexion AAROM  - 5 x daily - 7 x weekly - 1 sets - 10 reps - 10 seconds hold - Supine Quadricep Sets  - 5 x daily - 7 x weekly - 2 sets - 10 reps - 5 second hold - Supine Heel Slide with Strap  - 5 x daily - 7 x weekly - 1 sets - 10 reps - 10 seconds hold  ASSESSMENT:  CLINICAL IMPRESSION:  Treatment focusing on manual therapy and NMR to help activate pt's VMO. Pt's ROM focusing on both flexion and extension. Pt's knee flexion was measured to 70 degree in sitting limited by pt's pain. Patient will continue to benefit from skilled PT.   OBJECTIVE IMPAIRMENTS: Abnormal gait, decreased activity tolerance, decreased balance, decreased knowledge of use of DME, decreased mobility, difficulty walking, decreased ROM, decreased strength, hypomobility, increased muscle spasms, impaired flexibility, and pain.   ACTIVITY LIMITATIONS: sitting, standing, squatting, sleeping, stairs, transfers, bed mobility, and locomotion level  PARTICIPATION LIMITATIONS: driving, shopping, community activity, yard work, and church  PERSONAL FACTORS: Age, Behavior pattern, Education, Fitness, Past/current experiences,  Social background, and Time since onset of injury/illness/exacerbation are also affecting patient's functional outcome.   REHAB POTENTIAL: Good  CLINICAL DECISION MAKING: Stable/uncomplicated  EVALUATION COMPLEXITY: Low   GOALS: Goals reviewed with patient? No  SHORT TERM GOALS: Target date: 10/07/2023   Will be compliant with appropriate progressive HEP Goal status: updated, 09/14/2023  2. Lt knee ROM 5-85* Goal status: updated, 09/20/2023  3. Will be independent with edema management strategies  Goal status: updated, 09/20/2023   LONG TERM GOALS: Target date: 11/11/2023   Patient will demonstrate Lt knee MMT WFL throughout to faciltiate usual transfers, stairs, squatting at PLOF for daily life.  Goal status: updated, 09/14/2023  2. L Knee flexion AROM -5 to 90* Goal status: updated, 09/14/2023  3. Will be able to ascend and descend stairs reciprocally with no increase in pain Goal status: updated, 09/14/2023  4. Will be able to ambulate community distances and perform all household tasks with no increase in pain w/ LRAD  Goal status: updated, 09/14/2023  5.  Patient will demonstrate Patient specific functional scale >5 to indicate reduced disability due to condition.  Goal status: updated, 09/14/2023  6. L Knee flexion PROM -2 to 100* Goal status: updated, 09/14/2023   PLAN:  PT FREQUENCY:  4-5x/week for 2 weeks then 2-3x/week for 6 weeks  PT DURATION: 8 weeks  PLANNED INTERVENTIONS: 97750- Physical Performance Testing, 97110-Therapeutic exercises, 97530- Therapeutic activity, 97112- Neuromuscular re-education, 97535- Self Care, [id number]- Manual therapy, 97116- Gait training, and 97016- Vasopneumatic device  PLAN FOR NEXT SESSION: ROM, NMR for  quad activation, functional moblity   Delon Lunger, PT, MPT 09/20/23 12:17 PM   09/20/23 12:17 PM

## 2023-09-21 ENCOUNTER — Ambulatory Visit (INDEPENDENT_AMBULATORY_CARE_PROVIDER_SITE_OTHER)

## 2023-09-21 DIAGNOSIS — R2689 Other abnormalities of gait and mobility: Secondary | ICD-10-CM

## 2023-09-21 DIAGNOSIS — R600 Localized edema: Secondary | ICD-10-CM

## 2023-09-21 DIAGNOSIS — R262 Difficulty in walking, not elsewhere classified: Secondary | ICD-10-CM

## 2023-09-21 DIAGNOSIS — M6281 Muscle weakness (generalized): Secondary | ICD-10-CM | POA: Diagnosis not present

## 2023-09-21 DIAGNOSIS — M25562 Pain in left knee: Secondary | ICD-10-CM

## 2023-09-21 DIAGNOSIS — G8929 Other chronic pain: Secondary | ICD-10-CM

## 2023-09-21 DIAGNOSIS — M25662 Stiffness of left knee, not elsewhere classified: Secondary | ICD-10-CM

## 2023-09-21 NOTE — Therapy (Signed)
 OUTPATIENT PHYSICAL THERAPY TREATMENT    Patient Name: Patricia Hoover MRN: [id number] DOB:1945-08-29, 78 y.o., female Today's Date: 09/21/2023      END OF SESSION:  PT End of Session - 09/21/23 1300     Visit Number 12    Number of Visits 30    Date for PT Re-Evaluation 11/11/23    Authorization Type MCR    Authorization Time Period 08/12/23 to 10/07/23    Progress Note Due on Visit 18    PT Start Time 1302    PT Stop Time 1355    PT Time Calculation (min) 53 min    Activity Tolerance Patient tolerated treatment well;Patient limited by pain    Behavior During Therapy [facility] for tasks assessed/performed            Past Medical History:  Diagnosis Date   Angioedema 06/15/2017   Arrhythmia    possible hx, resolved prev   Blood transfusion without reported diagnosis 1972   had reaction; had to stop   Carpal tunnel syndrome    Cataract 2019   resolved with surgery   Complication of anesthesia    takes a long to wake up   Duodenal ulcer    H/O exercise stress test 2012   normal   Heart murmur    as child   Hematochezia    High cholesterol    History of kidney stones    Hx of colonic polyps    Hypertension    Osteoporosis 2010   t score - 3.9   Peptic ulcer of duodenum    Renal stones    Vitamin D  deficiency 03/01/2021   Past Surgical History:  Procedure Laterality Date   ABDOMINAL HYSTERECTOMY  1972   for endometriosis   CATARACT EXTRACTION W/ INTRAOCULAR LENS IMPLANT Right 06/2017   CATARACT EXTRACTION W/ INTRAOCULAR LENS IMPLANT Left 07/2017   COLONOSCOPY Left 03/02/2017   Procedure: COLONOSCOPY;  Surgeon: Janalyn Keene NOVAK, MD;  Location: [facility] ENDOSCOPY;  Service: Endoscopy;  Laterality: Left;   COLONOSCOPY WITH PROPOFOL  N/A 05/04/2016   Procedure: COLONOSCOPY WITH PROPOFOL ;  Surgeon: Ruel Kung, MD;  Location: [facility] ENDOSCOPY;  Service: Endoscopy;  Laterality: N/A;   ESOPHAGOGASTRODUODENOSCOPY Left 03/02/2017   Procedure: ESOPHAGOGASTRODUODENOSCOPY (EGD);   Surgeon: Janalyn Keene NOVAK, MD;  Location: [facility] ENDOSCOPY;  Service: Endoscopy;  Laterality: Left;   ESOPHAGOGASTRODUODENOSCOPY (EGD) WITH PROPOFOL  N/A 04/08/2017   Procedure: ESOPHAGOGASTRODUODENOSCOPY (EGD) WITH PROPOFOL ;  Surgeon: Janalyn Keene NOVAK, MD;  Location: [facility] ENDOSCOPY;  Service: Endoscopy;  Laterality: N/A;   EXAM UNDER ANESTHESIA WITH MANIPULATION OF KNEE Left 09/13/2023   Procedure: MANIPULATION, JOINT, KNEE, WITH ANESTHESIA;  Surgeon: Addie Cordella Hamilton, MD;  Location: [facility] OR;  Service: Orthopedics;  Laterality: Left;   LITHOTRIPSY     for kidney stone x5   LITHOTRIPSY  09/2017   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   Procedure: ARTHROPLASTY, KNEE, TOTAL, LEFT;  Surgeon: Addie Cordella Hamilton, MD;  Location: [facility] OR;  Service: Orthopedics;  Laterality: Left;   Patient Active Problem List   Diagnosis Date Noted   Arthrofibrosis of knee joint, left 09/18/2023   Arthritis of left knee 07/26/2023   Lower GI bleeding 06/29/2023   Hypokalemia 03/09/2023   Aortic atherosclerosis (HCC) 03/09/2023   Unilateral primary osteoarthritis, left knee 11/26/2022   Dysuria 04/21/2022   Stiff-legged gait 04/21/2022   Scalp lesion 09/21/2021   Clavicle enlargement 04/05/2021   Hyperpigmentation 03/01/2021   Vitamin D  deficiency 03/01/2021   Leg pain 05/21/2019   Right leg  pain 05/21/2019   GI bleed 03/04/2018   Healthcare maintenance 01/08/2018   Paresthesia 01/08/2018   Neck pain 08/31/2017   Angioedema 06/15/2017   External hemorrhoids    Diverticulosis of large intestine without diverticulitis    Internal hemorrhoids    Advance care planning 12/23/2016   Renal stone 04/20/2016   Heartburn 12/17/2015   Medicare annual wellness visit, subsequent 06/05/2013   Hyperglycemia 06/05/2013   Knee pain 08/31/2012   Age-related osteoporosis without current pathological fracture 08/18/2012   HTN (hypertension) 08/09/2012   HLD (hyperlipidemia) 08/09/2012    PCP: Cleatus Molly MD    REFERRING PROVIDER: Addie Cordella Hamilton, MD  REFERRING DIAG: Diagnosis 7256357113 (ICD-10-CM) - Chronic pain of left knee M17.12 (ICD-10-CM) - Unilateral primary osteoarthritis, left knee  THERAPY DIAG:  Stiffness of left knee, not elsewhere classified  Chronic pain of left knee  Difficulty in walking, not elsewhere classified  Muscle weakness (generalized)  Other abnormalities of gait and mobility  Localized edema  Rationale for Evaluation and Treatment: Rehabilitation  ONSET DATE: Lt TKR 07/26/23  SUBJECTIVE:   SUBJECTIVE STATEMENT:   Patient endorsing continued feelings of stiffness, especially in the morning. Pain not rated, but noted with the stiffness.    PERTINENT HISTORY: Left TKA 07/26/2023, OA, osteoporosis, heart murmur, HTN  PAIN:  NPRS scale: 5/10, pt 9/10 briefly during manual over pressure  Pain location: Lt knee  Pain description:  stiffness, tightness.  Aggravating factors: bending it  Relieving factors: nothing besides pain pills   PRECAUTIONS: None  RED FLAGS: None   WEIGHT BEARING RESTRICTIONS: No  FALLS:  Has patient fallen in last 6 months? No  LIVING ENVIRONMENT: Lives with: lives with their family Lives in: House/apartment Stairs:  flight inside but does not have to go up  Has following equipment at home: Vannie - 2 wheeled  OCCUPATION: retiredWater engineer at an [employer]   PLOF: Independent, Independent with basic ADLs, Independent with gait, and Independent with transfers  PATIENT GOALS: be able to line dance   NEXT MD VISIT: Referring 09/23/2023  OBJECTIVE:  Note: Objective measures were completed at Evaluation unless otherwise noted.  PATIENT SURVEYS:  PSFS: THE PATIENT SPECIFIC FUNCTIONAL SCALE  Place score of 0-10 (0 = unable to perform activity and 10 = able to perform activity at the same level as before injury or problem)  Activity Date: 08/12/23 09/14/23    Bending knee  5 1   2. Lifting leg  5 1    3.walking  2   4. Standing for ADLs  2   Total Score 5 1.5     Total Score = Sum of activity scores/number of activities  Minimally Detectable Change: 3 points (for single activity); 2 points (for average score)   COGNITION: 08/12/2023 Overall cognitive status: Within functional limits for tasks assessed     SENSATION: 08/12/2023 [facility]  EDEMA:  08/12/2023 Localized edema appropriate for post-op state    LOWER EXTREMITY ROM:  Active ROM Left eval Left  08/22/23 Left  08/24/23 Left 09/01/23 Left  09/14/23 Left 09/20/23  Hip flexion        Hip extension        Hip abduction        Hip adduction        Hip internal rotation        Hip external rotation        Knee flexion 67 64* 73* 62 in supine AROM heel slide  721 in PROM heel slide Supine  90/90 position A: 68* Supine A: 60 P: 65 Sitting:  P: 70  Knee extension -9 -5* -5* -8 in seated AROM LAQ  Supine -9* Supine P: -5  Ankle dorsiflexion        Ankle plantarflexion        Ankle inversion        Ankle eversion         (Blank rows = not tested)  LOWER EXTREMITY MMT:  MMT Left Eval 08/12/2023 Left 09/14/23  Hip flexion    Hip extension    Hip abduction    Hip adduction    Hip internal rotation    Hip external rotation    Knee flexion 3+ 3-/5  Knee extension 3+ 3-/5  Ankle dorsiflexion    Ankle plantarflexion    Ankle inversion    Ankle eversion     (Blank rows = not tested)    GAIT: 08/30/2023: Ambulation in clinic c SPC in Rt UE with SBA.  Reduced knee extension in stance, maintaining knee flexion.   08/12/2023 Distance walked: in clinic distances  Assistive device utilized: Environmental consultant - 2 wheeled Level of assistance: Modified independence Comments: antalgic, limited knee ROM during gait cycle                 TREATMENT       DATE: 09/21/2023 TherEx:  UBE with bilat UE and LE level 1 for 8 minutes with partial revolutions this date  Seated AAROM knee flexion 1x10 with 10s holds  Verbal cues required  for appropriate hip placement with knee flexion as well as appropriate hamstring contraction Seated AAROM knee extension 1x6 with 5-10s holds  Seated SAQ with foam roller under knee 1x10 with 5s holds  Verbal cues for appropriate form with PT discussing importance of quad contraction, how it changes with edema and pain/discomfort PT discussed HEP, printed another handout, and walked patient through how to open activities through the website   Self-Care:  PT spending extensive time discussing typical soreness, especially in the morning time, as well as how to combat with mobility to include knee flexion and extension, not just ambulation   Vaso:   Lt LE elevated on wedge with medium compression for 10 minutes at 34deg  TREATMENT       DATE: 09/20/2023 TherEx:  NMR:  Quad sets: x 30 holding 5-10 sec SAQ: working on left VMO activation x 30 holding 5-10 sec Seated SLR: working on holding quad activation and preventing knee lag 2 x 10  Sit to stand 2 x 10 working on knee extension and qlute squeezes Manual:  Overpressure with quad sets to pt's tolerance Sitting knee flexion c IR and distraction as tolerated Sitting knee flexion with pulls from strap with gradual progression to reach 70 degrees flexion on left knee Vasopneumatic; Medium compression, 34 deg x 10 minutes, LE elevated   TREATMENT       DATE: 09/16/2023 TherEx:  UBE with bilat UE and LE full fwd rotations throughout; level 1 for 6 minutes   Requiring square liner to decrease sliding as well as verbal cues for decreased hip movement with improved performance after a few minutes  Mini squats in parallel bars with focus on knee flexion 2x8 using bilat UE support to increase knee flexion Quad sets with foam roller under ankle to increase knee extension 2x10 with 5s holds  Discussed CPM, importance of active movement vs passive, and HEP   Manual:  Knee flexion with foot on 4 step and  PT providing distraction 10x10s   Vaso:  Lt  LE elevated on wedge, medium compression for 10 minutes    TREATMENT       DATE: 09/15/2023 TherEx:  SciFit seat 6, level 1, full fwd rotation after 30 seconds, for a total of 6 minutes  Supine 90/90 position with gravity assist knee flexion with active knee extension as well, 3x6 Heel prop for knee extension, 5x10 seconds due to pain for extended period time  Seated towel AAROM knee flexion/extension, 1x10 Glute bridhe MWM, 2x9  Vaso Lt LE elevated, medium compression, 34 degrees for 10 minutes     PATIENT EDUCATION:  Education details: exam findings, POC, course of care following TKR  Person educated: Patient Education method: Explanation and Demonstration Education comprehension: verbalized understanding, returned demonstration, and needs further education  HOME EXERCISE PROGRAM:  Access Code: [email or web address] URL: [email or web address] Date: 08/22/2023 Prepared by: Lamar Ivory  Exercises - Seated Knee Flexion AAROM  - 5 x daily - 7 x weekly - 1 sets - 10 reps - 10 seconds hold - Supine Quadricep Sets  - 5 x daily - 7 x weekly - 2 sets - 10 reps - 5 second hold - Supine Heel Slide with Strap  - 5 x daily - 7 x weekly - 1 sets - 10 reps - 10 seconds hold - Supine Short Arc Quad  - 1 x daily - 7 x weekly - 3 sets - 10 reps (added by Susannah Daring 09/21/2023)  ASSESSMENT:  CLINICAL IMPRESSION:  Patient arrived to session noting increased stiffness, soreness, and pain that is worse in the morning. PT discussed importance of mobility, not just ambulation, to decrease effects of prolonged inactivity (sleeping, etc.). Patient performed AAROM and SAQ with no notes on pain, though requiring verbal and tactile cues for appropriate form. Patient will continue to benefit from skilled PT.  OBJECTIVE IMPAIRMENTS: Abnormal gait, decreased activity tolerance, decreased balance, decreased knowledge of use of DME, decreased mobility, difficulty walking, decreased ROM, decreased strength,  hypomobility, increased muscle spasms, impaired flexibility, and pain.   ACTIVITY LIMITATIONS: sitting, standing, squatting, sleeping, stairs, transfers, bed mobility, and locomotion level  PARTICIPATION LIMITATIONS: driving, shopping, community activity, yard work, and church  PERSONAL FACTORS: Age, Behavior pattern, Education, Fitness, Past/current experiences, Social background, and Time since onset of injury/illness/exacerbation are also affecting patient's functional outcome.   REHAB POTENTIAL: Good  CLINICAL DECISION MAKING: Stable/uncomplicated  EVALUATION COMPLEXITY: Low   GOALS: Goals reviewed with patient? No  SHORT TERM GOALS: Target date: 10/07/2023   Will be compliant with appropriate progressive HEP Goal status: updated, 09/14/2023  2. Lt knee ROM 5-85* Goal status: updated, 09/20/2023  3. Will be independent with edema management strategies  Goal status: updated, 09/20/2023   LONG TERM GOALS: Target date: 11/11/2023   Patient will demonstrate Lt knee MMT WFL throughout to faciltiate usual transfers, stairs, squatting at PLOF for daily life.  Goal status: updated, 09/14/2023  2. L Knee flexion AROM -5 to 90* Goal status: updated, 09/14/2023  3. Will be able to ascend and descend stairs reciprocally with no increase in pain Goal status: updated, 09/14/2023  4. Will be able to ambulate community distances and perform all household tasks with no increase in pain w/ LRAD  Goal status: updated, 09/14/2023  5.  Patient will demonstrate Patient specific functional scale >5 to indicate reduced disability due to condition.  Goal status: updated, 09/14/2023  6. L Knee flexion PROM -2 to 100* Goal status: updated, 09/14/2023   PLAN:  PT FREQUENCY:  4-5x/week for 2 weeks then 2-3x/week for 6 weeks  PT DURATION: 8 weeks  PLANNED INTERVENTIONS: 97750- Physical Performance Testing, 97110-Therapeutic exercises, 97530- Therapeutic activity, [id number]- Neuromuscular re-education,  97535- Self Care, [id number]- Manual therapy, 97116- Gait training, and 97016- Vasopneumatic device  PLAN FOR NEXT SESSION: ROM, NMR for quad activation, functional moblity, reassess HEP completion    Susannah Daring, PT, DPT 09/21/23 2:06 PM

## 2023-09-22 ENCOUNTER — Ambulatory Visit (INDEPENDENT_AMBULATORY_CARE_PROVIDER_SITE_OTHER): Admitting: Physical Therapy

## 2023-09-22 ENCOUNTER — Encounter: Payer: Self-pay | Admitting: Physical Therapy

## 2023-09-22 ENCOUNTER — Telehealth: Payer: Self-pay | Admitting: Gastroenterology

## 2023-09-22 DIAGNOSIS — G8929 Other chronic pain: Secondary | ICD-10-CM | POA: Diagnosis not present

## 2023-09-22 DIAGNOSIS — M6281 Muscle weakness (generalized): Secondary | ICD-10-CM

## 2023-09-22 DIAGNOSIS — R2681 Unsteadiness on feet: Secondary | ICD-10-CM

## 2023-09-22 DIAGNOSIS — M25562 Pain in left knee: Secondary | ICD-10-CM | POA: Diagnosis not present

## 2023-09-22 DIAGNOSIS — M25662 Stiffness of left knee, not elsewhere classified: Secondary | ICD-10-CM | POA: Diagnosis not present

## 2023-09-22 DIAGNOSIS — R600 Localized edema: Secondary | ICD-10-CM

## 2023-09-22 DIAGNOSIS — R2689 Other abnormalities of gait and mobility: Secondary | ICD-10-CM | POA: Diagnosis not present

## 2023-09-22 DIAGNOSIS — R262 Difficulty in walking, not elsewhere classified: Secondary | ICD-10-CM

## 2023-09-22 NOTE — Therapy (Addendum)
 OUTPATIENT PHYSICAL THERAPY TREATMENT    Patient Name: Patricia Hoover MRN: [id number] DOB:1945/03/17, 78 y.o., female Today's Date: 09/22/2023      END OF SESSION:  PT End of Session - 09/22/23 1020     Visit Number 13    Number of Visits 30    Date for PT Re-Evaluation 11/11/23    Authorization Type MCR    Authorization Time Period 08/12/23 to 10/07/23    Progress Note Due on Visit 18    PT Start Time 1017    PT Stop Time 1117    PT Time Calculation (min) 60 min    Activity Tolerance Patient tolerated treatment well;Patient limited by pain    Behavior During Therapy [facility] for tasks assessed/performed             Past Medical History:  Diagnosis Date   Angioedema 06/15/2017   Arrhythmia    possible hx, resolved prev   Blood transfusion without reported diagnosis 1972   had reaction; had to stop   Carpal tunnel syndrome    Cataract 2019   resolved with surgery   Complication of anesthesia    takes a long to wake up   Duodenal ulcer    H/O exercise stress test 2012   normal   Heart murmur    as child   Hematochezia    High cholesterol    History of kidney stones    Hx of colonic polyps    Hypertension    Osteoporosis 2010   t score - 3.9   Peptic ulcer of duodenum    Renal stones    Vitamin D  deficiency 03/01/2021   Past Surgical History:  Procedure Laterality Date   ABDOMINAL HYSTERECTOMY  1972   for endometriosis   CATARACT EXTRACTION W/ INTRAOCULAR LENS IMPLANT Right 06/2017   CATARACT EXTRACTION W/ INTRAOCULAR LENS IMPLANT Left 07/2017   COLONOSCOPY Left 03/02/2017   Procedure: COLONOSCOPY;  Surgeon: Janalyn Keene NOVAK, MD;  Location: [facility] ENDOSCOPY;  Service: Endoscopy;  Laterality: Left;   COLONOSCOPY WITH PROPOFOL  N/A 05/04/2016   Procedure: COLONOSCOPY WITH PROPOFOL ;  Surgeon: Ruel Kung, MD;  Location: [facility] ENDOSCOPY;  Service: Endoscopy;  Laterality: N/A;   ESOPHAGOGASTRODUODENOSCOPY Left 03/02/2017   Procedure: ESOPHAGOGASTRODUODENOSCOPY (EGD);   Surgeon: Janalyn Keene NOVAK, MD;  Location: [facility] ENDOSCOPY;  Service: Endoscopy;  Laterality: Left;   ESOPHAGOGASTRODUODENOSCOPY (EGD) WITH PROPOFOL  N/A 04/08/2017   Procedure: ESOPHAGOGASTRODUODENOSCOPY (EGD) WITH PROPOFOL ;  Surgeon: Janalyn Keene NOVAK, MD;  Location: [facility] ENDOSCOPY;  Service: Endoscopy;  Laterality: N/A;   EXAM UNDER ANESTHESIA WITH MANIPULATION OF KNEE Left 09/13/2023   Procedure: MANIPULATION, JOINT, KNEE, WITH ANESTHESIA;  Surgeon: Addie Cordella Hamilton, MD;  Location: [facility] OR;  Service: Orthopedics;  Laterality: Left;   LITHOTRIPSY     for kidney stone x5   LITHOTRIPSY  09/2017   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 OR;  Service: Orthopedics;  Laterality: Left;   Patient Active Problem List   Diagnosis Date Noted   Arthrofibrosis of knee joint, left 09/18/2023   Arthritis of left knee 07/26/2023   Lower GI bleeding 06/29/2023   Hypokalemia 03/09/2023   Aortic atherosclerosis (HCC) 03/09/2023   Unilateral primary osteoarthritis, left knee 11/26/2022   Dysuria 04/21/2022   Stiff-legged gait 04/21/2022   Scalp lesion 09/21/2021   Clavicle enlargement 04/05/2021   Hyperpigmentation 03/01/2021   Vitamin D  deficiency 03/01/2021   Leg pain 05/21/2019   Right  leg pain 05/21/2019   GI bleed 03/04/2018   Healthcare maintenance 01/08/2018   Paresthesia 01/08/2018   Neck pain 08/31/2017   Angioedema 06/15/2017   External hemorrhoids    Diverticulosis of large intestine without diverticulitis    Internal hemorrhoids    Advance care planning 12/23/2016   Renal stone 04/20/2016   Heartburn 12/17/2015   Medicare annual wellness visit, subsequent 06/05/2013   Hyperglycemia 06/05/2013   Knee pain 08/31/2012   Age-related osteoporosis without current pathological fracture 08/18/2012   HTN (hypertension) 08/09/2012   HLD (hyperlipidemia) 08/09/2012    PCP: Cleatus Molly MD    REFERRING PROVIDER: Addie Cordella Hamilton, MD  REFERRING DIAG: Diagnosis [phone] (ICD-10-CM) - Chronic pain of left knee M17.12 (ICD-10-CM) - Unilateral primary osteoarthritis, left knee  THERAPY DIAG:  Stiffness of left knee, not elsewhere classified  Chronic pain of left knee  Difficulty in walking, not elsewhere classified  Muscle weakness (generalized)  Other abnormalities of gait and mobility  Localized edema  Unsteadiness on feet  Rationale for Evaluation and Treatment: Rehabilitation  ONSET DATE: Lt TKR 07/26/23  SUBJECTIVE:   SUBJECTIVE STATEMENT:   Patient wakes up with high levels of stiffness.    PERTINENT HISTORY: Left TKA 07/26/2023, OA, osteoporosis, heart murmur, HTN  PAIN:  NPRS scale: no pain with rest and can go up to 7/10 with movement Pain location: Lt knee  Pain description:  stiffness, tightness.  Aggravating factors: bending it  Relieving factors: nothing besides pain pills   PRECAUTIONS: None  RED FLAGS: None   WEIGHT BEARING RESTRICTIONS: No  FALLS:  Has patient fallen in last 6 months? No  LIVING ENVIRONMENT: Lives with: lives with their family Lives in: House/apartment Stairs:  flight inside but does not have to go up  Has following equipment at home: Vannie - 2 wheeled  OCCUPATION: retiredWater engineer at an [employer]   PLOF: Independent, Independent with basic ADLs, Independent with gait, and Independent with transfers  PATIENT GOALS: be able to line dance   NEXT MD VISIT: Referring 09/23/2023  OBJECTIVE:  Note: Objective measures were completed at Evaluation unless otherwise noted.  PATIENT SURVEYS:  PSFS: THE PATIENT SPECIFIC FUNCTIONAL SCALE  Place score of 0-10 (0 = unable to perform activity and 10 = able to perform activity at the same level as before injury or problem)  Activity Date: 08/12/23 09/14/23    Bending knee  5 1   2. Lifting leg  5 1   3.walking  2   4. Standing for ADLs  2   Total Score 5  1.5     Total Score = Sum of activity scores/number of activities  Minimally Detectable Change: 3 points (for single activity); 2 points (for average score)   COGNITION: 08/12/2023 Overall cognitive status: Within functional limits for tasks assessed     SENSATION: 08/12/2023 [facility]  EDEMA:  08/12/2023 Localized edema appropriate for post-op state    LOWER EXTREMITY ROM:  Active ROM Left eval Left  08/22/23 Left  08/24/23 Left 09/01/23 Left  09/14/23 Left 09/20/23  Hip flexion        Hip extension        Hip abduction        Hip adduction        Hip internal rotation        Hip external rotation        Knee flexion 67 64* 73* 62 in supine AROM heel slide  721 in PROM heel slide Supine 90/90 position  A: 68* Supine A: 60 P: 65 Sitting:  P: 70  Knee extension -9 -5* -5* -8 in seated AROM LAQ  Supine -9* Supine P: -5  Ankle dorsiflexion        Ankle plantarflexion        Ankle inversion        Ankle eversion         (Blank rows = not tested)  LOWER EXTREMITY MMT:  MMT Left Eval 08/12/2023 Left 09/14/23  Hip flexion    Hip extension    Hip abduction    Hip adduction    Hip internal rotation    Hip external rotation    Knee flexion 3+ 3-/5  Knee extension 3+ 3-/5  Ankle dorsiflexion    Ankle plantarflexion    Ankle inversion    Ankle eversion     (Blank rows = not tested)    GAIT: 08/30/2023: Ambulation in clinic c SPC in Rt UE with SBA.  Reduced knee extension in stance, maintaining knee flexion.   08/12/2023 Distance walked: in clinic distances  Assistive device utilized: Environmental consultant - 2 wheeled Level of assistance: Modified independence Comments: antalgic, limited knee ROM during gait cycle                 TREATMENT       DATE: 09/22/2023 TherEx:  Scifit, seat 6, level 1, for 8 minutes. Partial circles for 5 minutes and occasional bwd/fwd full revolution circles for the last 3 minutes Hip flexor stretch with strap, 3x30 seconds  TherAct TKE against red  resistance band, 1x10. To help with complete weight shift when walking TKE against green resistance band, 1x10. To help with complete weight shift  when walking PT demo & verbal cues on TKE as HEP with HO.  Pt verbalized understanding.   Neuro Re-Ed Quad sets for attempted quad activation in Lt LE. Tactile and verbal cues used. Attempted quad activation in Rt LE first and then at the same time as the Lt LE to get quad activation with minimal results. Patient had most quad activation in Lt LE when rolling up a towel under her knee to push against  Manual Supine with heel prop, grade 3 extension mobs for knee extension. Moderate discomfort from patient Over pressure knee flexion   Gait Training PT demo & verbal cues on knee flexion for swing initiating in terminal stance and knee ext in stance.  Pt able to return demo understanding mimicking PT ambulating with cane.    Vaso  Lt LE elevated on wedge with medium compression for 10 minutes at 34deg   TREATMENT       DATE: 09/21/2023 TherEx:  UBE with bilat UE and LE level 1 for 8 minutes with partial revolutions this date  Seated AAROM knee flexion 1x10 with 10s holds  Verbal cues required for appropriate hip placement with knee flexion as well as appropriate hamstring contraction Seated AAROM knee extension 1x6 with 5-10s holds  Seated SAQ with foam roller under knee 1x10 with 5s holds  Verbal cues for appropriate form with PT discussing importance of quad contraction, how it changes with edema and pain/discomfort PT discussed HEP, printed another handout, and walked patient through how to open activities through the website   Self-Care:  PT spending extensive time discussing typical soreness, especially in the morning time, as well as how to combat with mobility to include knee flexion and extension, not just ambulation   Vaso:   Lt LE elevated on wedge with  medium compression for 10 minutes at 34deg  TREATMENT       DATE:  09/20/2023 TherEx:  NMR:  Quad sets: x 30 holding 5-10 sec SAQ: working on left VMO activation x 30 holding 5-10 sec Seated SLR: working on holding quad activation and preventing knee lag 2 x 10  Sit to stand 2 x 10 working on knee extension and qlute squeezes Manual:  Overpressure with quad sets to pt's tolerance Sitting knee flexion c IR and distraction as tolerated Sitting knee flexion with pulls from strap with gradual progression to reach 70 degrees flexion on left knee Vasopneumatic; Medium compression, 34 deg x 10 minutes, LE elevated   TREATMENT       DATE: 09/16/2023 TherEx:  UBE with bilat UE and LE full fwd rotations throughout; level 1 for 6 minutes   Requiring square liner to decrease sliding as well as verbal cues for decreased hip movement with improved performance after a few minutes  Mini squats in parallel bars with focus on knee flexion 2x8 using bilat UE support to increase knee flexion Quad sets with foam roller under ankle to increase knee extension 2x10 with 5s holds  Discussed CPM, importance of active movement vs passive, and HEP   Manual:  Knee flexion with foot on 4 step and PT providing distraction 10x10s   Vaso:  Lt LE elevated on wedge, medium compression for 10 minutes     PATIENT EDUCATION:  Education details: exam findings, POC, course of care following TKR  Person educated: Patient Education method: Explanation and Demonstration Education comprehension: verbalized understanding, returned demonstration, and needs further education  HOME EXERCISE PROGRAM: Access Code: [email or web address] URL: [email or web address] Date: 09/22/2023 Prepared by: Ismael Theophilus Stallion  Exercises - Supine Heel Slide with Strap  - 5 x daily - 7 x weekly - 1 sets - 10 reps - 10 seconds hold - Supine Knee Extension Stretch on Towel Roll  - 1 x daily - 7 x weekly - 3 sets - 10 reps - Seated Knee Flexion Extension AROM   - 1 x daily - 7 x weekly - 3 sets -  10 reps - Seated Knee Flexion AAROM  - 1 x daily - 7 x weekly - 3 sets - 10 reps - Supine Short Arc Quad  - 1 x daily - 7 x weekly - 3 sets - 10 reps - Standing Terminal Knee Extension with Resistance  - 1 x daily - 7 x weekly - 2-3 sets - 10 reps - 5 seconds hold  ASSESSMENT:  CLINICAL IMPRESSION:  Patient tolerated treatment well today. Patient has difficulty with activating quadriceps in her Lt LE which causes her high levels of frustration. Additionally patient is not able to adequately weight shift onto Lt LE during stance phrase when walking which could be a combination of weakness and inability to get full extension. Patient has moderate levels of pain with manual for knee extension/flexion but would benefit from continual reps of both. Patient will continue to benefit from skilled PT to address impairments and maximize ROM.   OBJECTIVE IMPAIRMENTS: Abnormal gait, decreased activity tolerance, decreased balance, decreased knowledge of use of DME, decreased mobility, difficulty walking, decreased ROM, decreased strength, hypomobility, increased muscle spasms, impaired flexibility, and pain.   ACTIVITY LIMITATIONS: sitting, standing, squatting, sleeping, stairs, transfers, bed mobility, and locomotion level  PARTICIPATION LIMITATIONS: driving, shopping, community activity, yard work, and church  PERSONAL FACTORS: Age, Behavior pattern, Education, Fitness, Past/current experiences, Social background, and Time since onset of  injury/illness/exacerbation are also affecting patient's functional outcome.   REHAB POTENTIAL: Good  CLINICAL DECISION MAKING: Stable/uncomplicated  EVALUATION COMPLEXITY: Low   GOALS: Goals reviewed with patient? No  SHORT TERM GOALS: Target date: 10/07/2023   Will be compliant with appropriate progressive HEP Goal status: updated, 09/14/2023  2. Lt knee ROM 5-85* Goal status: updated, 09/20/2023  3. Will be independent with edema management strategies  Goal  status: updated, 09/20/2023   LONG TERM GOALS: Target date: 11/11/2023   Patient will demonstrate Lt knee MMT WFL throughout to faciltiate usual transfers, stairs, squatting at PLOF for daily life.  Goal status: updated, 09/14/2023  2. L Knee flexion AROM -5 to 90* Goal status: updated, 09/14/2023  3. Will be able to ascend and descend stairs reciprocally with no increase in pain Goal status: updated, 09/14/2023  4. Will be able to ambulate community distances and perform all household tasks with no increase in pain w/ LRAD  Goal status: updated, 09/14/2023  5.  Patient will demonstrate Patient specific functional scale >5 to indicate reduced disability due to condition.  Goal status: updated, 09/14/2023  6. L Knee flexion PROM -2 to 100* Goal status: updated, 09/14/2023   PLAN:  PT FREQUENCY:  4-5x/week for 2 weeks then 2-3x/week for 6 weeks  PT DURATION: 8 weeks  PLANNED INTERVENTIONS: 97750- Physical Performance Testing, 97110-Therapeutic exercises, 97530- Therapeutic activity, 97112- Neuromuscular re-education, 97535- Self Care, [id number]- Manual therapy, [phone]- Gait training, and [id number]- Vasopneumatic device  PLAN FOR NEXT SESSION: flexion/extension gains, gait training, quad activation    Ismael Theophilus Stallion, SPT 09/22/23 3:35 PM   This entire session of physical therapy was performed under the direct supervision of PT signing evaluation /treatment. PT reviewed note and agrees.   Robin Waldron, PT, DPT 09/22/2023, 8:32 PM

## 2023-09-22 NOTE — Telephone Encounter (Signed)
 Patient had CT in February and June this year. 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 Patient asks if this requires any follow up.

## 2023-09-22 NOTE — Telephone Encounter (Signed)
 PT is calling to ask if we would review a CT that she had. She stated that she was told she has a spot on her liver and she would like to talk about that with a provider. Please advise.

## 2023-09-22 NOTE — Telephone Encounter (Signed)
 Called the patient. Left voicemail of my call.

## 2023-09-23 ENCOUNTER — Telehealth: Payer: Self-pay | Admitting: Orthopedic Surgery

## 2023-09-23 ENCOUNTER — Encounter: Admitting: Orthopedic Surgery

## 2023-09-23 ENCOUNTER — Telehealth: Payer: Self-pay | Admitting: Radiology

## 2023-09-23 ENCOUNTER — Encounter: Admitting: Rehabilitative and Restorative Service Providers"

## 2023-09-23 NOTE — Telephone Encounter (Signed)
 IC she is at 90 on CPM and will try to maintain/continue this 2-3 x/day

## 2023-09-23 NOTE — Telephone Encounter (Signed)
 Patient called. Would like to know if she could drive? Had to cancel today because she was around someone with COVID

## 2023-09-23 NOTE — Telephone Encounter (Signed)
 I called and talked with patient this afternoon. This has been taken care of

## 2023-09-23 NOTE — Telephone Encounter (Signed)
 Patient called and said she has a few questions about the machine. If you could give her a call please. CB#[phone]

## 2023-09-23 NOTE — Telephone Encounter (Signed)
 Patient called previously this morning, she had to cancel her appointment with Dr. Addie because her transportation had COVID.   Patient is asking if she can drive herself at this point because she no longer has any transportation due to family members having COVID.   Question she was going to ask today about knee CPM machine, she is currently set at 90, can she increase at this point or not, if so how much... states she has no instructions.   CB# [phone]

## 2023-09-23 NOTE — Telephone Encounter (Signed)
Her decision

## 2023-09-23 NOTE — Therapy (Signed)
 OUTPATIENT PHYSICAL THERAPY TREATMENT    Patient Name: Patricia Hoover MRN: [id number] DOB:[birth year], 78 y.o., female Today's Date: 09/26/2023      END OF SESSION:  PT End of Session - 09/26/23 1522     Visit Number 14    Number of Visits 30    Date for PT Re-Evaluation 11/11/23    Authorization Type MCR    Authorization Time Period 08/12/23 to 10/07/23    PT Start Time 1345    PT Stop Time 1433    PT Time Calculation (min) 48 min    Activity Tolerance Patient tolerated treatment well    Behavior During Therapy [facility] for tasks assessed/performed              Past Medical History:  Diagnosis Date   Angioedema 06/15/2017   Arrhythmia    possible hx, resolved prev   Blood transfusion without reported diagnosis 1972   had reaction; had to stop   Carpal tunnel syndrome    Cataract 2019   resolved with surgery   Complication of anesthesia    takes a long to wake up   Duodenal ulcer    H/O exercise stress test 2012   normal   Heart murmur    as child   Hematochezia    High cholesterol    History of kidney stones    Hx of colonic polyps    Hypertension    Osteoporosis 2010   t score - 3.9   Peptic ulcer of duodenum    Renal stones    Vitamin D  deficiency 03/01/2021   Past Surgical History:  Procedure Laterality Date   ABDOMINAL HYSTERECTOMY  1972   for endometriosis   CATARACT EXTRACTION W/ INTRAOCULAR LENS IMPLANT Right 06/2017   CATARACT EXTRACTION W/ INTRAOCULAR LENS IMPLANT Left 07/2017   COLONOSCOPY Left 03/02/2017   Procedure: COLONOSCOPY;  Surgeon: Janalyn Keene NOVAK, MD;  Location: [facility] ENDOSCOPY;  Service: Endoscopy;  Laterality: Left;   COLONOSCOPY WITH PROPOFOL  N/A 05/04/2016   Procedure: COLONOSCOPY WITH PROPOFOL ;  Surgeon: Ruel Kung, MD;  Location: [facility] ENDOSCOPY;  Service: Endoscopy;  Laterality: N/A;   ESOPHAGOGASTRODUODENOSCOPY Left 03/02/2017   Procedure: ESOPHAGOGASTRODUODENOSCOPY (EGD);  Surgeon: Janalyn Keene NOVAK, MD;  Location: [facility]  ENDOSCOPY;  Service: Endoscopy;  Laterality: Left;   ESOPHAGOGASTRODUODENOSCOPY (EGD) WITH PROPOFOL  N/A 04/08/2017   Procedure: ESOPHAGOGASTRODUODENOSCOPY (EGD) WITH PROPOFOL ;  Surgeon: Janalyn Keene NOVAK, MD;  Location: [facility] ENDOSCOPY;  Service: Endoscopy;  Laterality: N/A;   EXAM UNDER ANESTHESIA WITH MANIPULATION OF KNEE Left 09/13/2023   Procedure: MANIPULATION, JOINT, KNEE, WITH ANESTHESIA;  Surgeon: Addie Cordella Hamilton, MD;  Location: [facility] OR;  Service: Orthopedics;  Laterality: Left;   LITHOTRIPSY     for kidney stone x5   LITHOTRIPSY  09/2017   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   Procedure: ARTHROPLASTY, KNEE, TOTAL, LEFT;  Surgeon: Addie Cordella Hamilton, MD;  Location: [facility] OR;  Service: Orthopedics;  Laterality: Left;   Patient Active Problem List   Diagnosis Date Noted   Arthrofibrosis of knee joint, left 09/18/2023   Arthritis of left knee 07/26/2023   Lower GI bleeding 06/29/2023   Hypokalemia 03/09/2023   Aortic atherosclerosis (HCC) 03/09/2023   Unilateral primary osteoarthritis, left knee 11/26/2022   Dysuria 04/21/2022   Stiff-legged gait 04/21/2022   Scalp lesion 09/21/2021   Clavicle enlargement 04/05/2021   Hyperpigmentation 03/01/2021   Vitamin D  deficiency 03/01/2021   Leg pain 05/21/2019   Right leg pain 05/21/2019   GI bleed 03/04/2018   Healthcare  maintenance 01/08/2018   Paresthesia 01/08/2018   Neck pain 08/31/2017   Angioedema 06/15/2017   External hemorrhoids    Diverticulosis of large intestine without diverticulitis    Internal hemorrhoids    Advance care planning 12/23/2016   Renal stone 04/20/2016   Heartburn 12/17/2015   Medicare annual wellness visit, subsequent 06/05/2013   Hyperglycemia 06/05/2013   Knee pain 08/31/2012   Age-related osteoporosis without current pathological fracture 08/18/2012   HTN (hypertension) 08/09/2012   HLD (hyperlipidemia) 08/09/2012    PCP: Cleatus Molly MD   REFERRING PROVIDER: Addie Cordella Hamilton,  MD  REFERRING DIAG: Diagnosis [phone] (ICD-10-CM) - Chronic pain of left knee M17.12 (ICD-10-CM) - Unilateral primary osteoarthritis, left knee  THERAPY DIAG:  Stiffness of left knee, not elsewhere classified  Chronic pain of left knee  Difficulty in walking, not elsewhere classified  Muscle weakness (generalized)  Other abnormalities of gait and mobility  Localized edema  Rationale for Evaluation and Treatment: Rehabilitation  ONSET DATE: Lt TKR 07/26/23  SUBJECTIVE:   SUBJECTIVE STATEMENT:  Patient reports the knee always feels stiff.  PERTINENT HISTORY: Left TKA 07/26/2023, OA, osteoporosis, heart murmur, HTN  PAIN:  NPRS scale: no pain with rest and can go up to 5/10 with movement Pain location: Lt knee  Pain description:  stiffness, tightness.  Aggravating factors: bending it  Relieving factors: nothing besides pain pills   PRECAUTIONS: None  RED FLAGS: None   WEIGHT BEARING RESTRICTIONS: No  FALLS:  Has patient fallen in last 6 months? No  LIVING ENVIRONMENT: Lives with: lives with their family Lives in: House/apartment Stairs:  flight inside but does not have to go up  Has following equipment at home: Vannie - 2 wheeled  OCCUPATION: retiredWater engineer at an [employer]   PLOF: Independent, Independent with basic ADLs, Independent with gait, and Independent with transfers  PATIENT GOALS: be able to line dance   NEXT MD VISIT: Referring 09/23/2023  OBJECTIVE:  Note: Objective measures were completed at Evaluation unless otherwise noted.  PATIENT SURVEYS:  PSFS: THE PATIENT SPECIFIC FUNCTIONAL SCALE  Place score of 0-10 (0 = unable to perform activity and 10 = able to perform activity at the same level as before injury or problem)  Activity Date: 08/12/23 09/14/23    Bending knee  5 1   2. Lifting leg  5 1   3.walking  2   4. Standing for ADLs  2   Total Score 5 1.5     Total Score = Sum of activity scores/number of  activities  Minimally Detectable Change: 3 points (for single activity); 2 points (for average score)   COGNITION: 08/12/2023 Overall cognitive status: Within functional limits for tasks assessed     SENSATION: 08/12/2023 [facility]  EDEMA:  08/12/2023 Localized edema appropriate for post-op state    LOWER EXTREMITY ROM:  Active ROM Left eval Left  08/22/23 Left  08/24/23 Left 09/01/23 Left  09/14/23 Left 09/20/23  Hip flexion        Hip extension        Hip abduction        Hip adduction        Hip internal rotation        Hip external rotation        Knee flexion 67 64* 73* 62 in supine AROM heel slide  721 in PROM heel slide Supine 90/90 position A: 68* Supine A: 60 P: 65 Sitting:  P: 70  Knee extension -9 -5* -5* -8 in  seated AROM LAQ  Supine -9* Supine P: -5  Ankle dorsiflexion        Ankle plantarflexion        Ankle inversion        Ankle eversion         (Blank rows = not tested)  LOWER EXTREMITY MMT:  MMT Left Eval 08/12/2023 Left 09/14/23  Hip flexion    Hip extension    Hip abduction    Hip adduction    Hip internal rotation    Hip external rotation    Knee flexion 3+ 3-/5  Knee extension 3+ 3-/5  Ankle dorsiflexion    Ankle plantarflexion    Ankle inversion    Ankle eversion     (Blank rows = not tested)    GAIT: 08/30/2023: Ambulation in clinic c SPC in Rt UE with SBA.  Reduced knee extension in stance, maintaining knee flexion.   08/12/2023 Distance walked: in clinic distances  Assistive device utilized: Environmental consultant - 2 wheeled Level of assistance: Modified independence Comments: antalgic, limited knee ROM during gait cycle                 TREATMENT       DATE: 09/26/2023 TherEx:  Scifit, seat 5, level 1, for 8 minutes. Partial circles 6 min Hip flexor stretch with strap, 3x30 seconds  TherAct TKE against green resistance band, 2x10. To help with complete weight shift  when walking Heel slides in seated  for inc flexion with ambulation Neuro  Re-Ed Quad sets for attempted quad activation in Lt LE.  Mini squats in // bars- added to HEP Manual Supine with heel prop, grade 3 extension mobs for knee extension. Over pressure knee flexion   Vaso  Lt LE elevated on wedge with medium compression for 10 minutes at 34deg     TREATMENT       DATE: 09/22/2023 TherEx:  Scifit, seat 6, level 1, for 8 minutes. Partial circles for 5 minutes and occasional bwd/fwd full revolution circles for the last 3 minutes Hip flexor stretch with strap, 3x30 seconds  TherAct TKE against red resistance band, 1x10. To help with complete weight shift when walking TKE against green resistance band, 1x10. To help with complete weight shift  when walking PT demo & verbal cues on TKE as HEP with HO.  Pt verbalized understanding.   Neuro Re-Ed Quad sets for attempted quad activation in Lt LE. Tactile and verbal cues used. Attempted quad activation in Rt LE first and then at the same time as the Lt LE to get quad activation with minimal results. Patient had most quad activation in Lt LE when rolling up a towel under her knee to push against  Manual Supine with heel prop, grade 3 extension mobs for knee extension. Moderate discomfort from patient Over pressure knee flexion   Gait Training PT demo & verbal cues on knee flexion for swing initiating in terminal stance and knee ext in stance.  Pt able to return demo understanding mimicking PT ambulating with cane.    Vaso  Lt LE elevated on wedge with medium compression for 10 minutes at 34deg   TREATMENT       DATE: 09/21/2023 TherEx:  UBE with bilat UE and LE level 1 for 8 minutes with partial revolutions this date  Seated AAROM knee flexion 1x10 with 10s holds  Verbal cues required for appropriate hip placement with knee flexion as well as appropriate hamstring contraction Seated AAROM knee extension 1x6 with  5-10s holds  Seated SAQ with foam roller under knee 1x10 with 5s holds  Verbal cues for  appropriate form with PT discussing importance of quad contraction, how it changes with edema and pain/discomfort PT discussed HEP, printed another handout, and walked patient through how to open activities through the website   Self-Care:  PT spending extensive time discussing typical soreness, especially in the morning time, as well as how to combat with mobility to include knee flexion and extension, not just ambulation   Vaso:   Lt LE elevated on wedge with medium compression for 10 minutes at 34deg  TREATMENT       DATE: 09/20/2023 TherEx:  NMR:  Quad sets: x 30 holding 5-10 sec SAQ: working on left VMO activation x 30 holding 5-10 sec Seated SLR: working on holding quad activation and preventing knee lag 2 x 10  Sit to stand 2 x 10 working on knee extension and qlute squeezes Manual:  Overpressure with quad sets to pt's tolerance Sitting knee flexion c IR and distraction as tolerated Sitting knee flexion with pulls from strap with gradual progression to reach 70 degrees flexion on left knee Vasopneumatic; Medium compression, 34 deg x 10 minutes, LE elevated   TREATMENT       DATE: 09/16/2023 TherEx:  UBE with bilat UE and LE full fwd rotations throughout; level 1 for 6 minutes   Requiring square liner to decrease sliding as well as verbal cues for decreased hip movement with improved performance after a few minutes  Mini squats in parallel bars with focus on knee flexion 2x8 using bilat UE support to increase knee flexion Quad sets with foam roller under ankle to increase knee extension 2x10 with 5s holds  Discussed CPM, importance of active movement vs passive, and HEP   Manual:  Knee flexion with foot on 4 step and PT providing distraction 10x10s   Vaso:  Lt LE elevated on wedge, medium compression for 10 minutes     PATIENT EDUCATION:  Education details: exam findings, POC, course of care following TKR  Person educated: Patient Education method: Explanation and  Demonstration Education comprehension: verbalized understanding, returned demonstration, and needs further education  HOME EXERCISE PROGRAM: Access Code: [email or web address] URL: [email or web address] Date: 09/22/2023 Prepared by: Ismael Theophilus Stallion Access Code: [email or web address] URL: [email or web address] Date: 09/26/2023 Prepared by: Burnard Meth  Exercises - Supine Heel Slide with Strap  - 5 x daily - 7 x weekly - 1 sets - 10 reps - 10 seconds hold - Supine Knee Extension Stretch on Towel Roll  - 1 x daily - 7 x weekly - 3 sets - 10 reps - Seated Knee Flexion Extension AROM   - 1 x daily - 7 x weekly - 3 sets - 10 reps - Seated Knee Flexion AAROM  - 1 x daily - 7 x weekly - 3 sets - 10 reps - Supine Short Arc Quad  - 1 x daily - 7 x weekly - 3 sets - 10 reps - Standing Terminal Knee Extension with Resistance  - 1 x daily - 7 x weekly - 2-3 sets - 10 reps - 5 seconds hold - Mini Squat with Counter Support  - 2 x daily - 7 x weekly - 2 sets - 10 reps - 5 hold  ASSESSMENT:  CLINICAL IMPRESSION:  Pt needed VC for hip hike while flexing the knee.  Demonstrated understanding.  OBJECTIVE IMPAIRMENTS: Abnormal gait, decreased activity tolerance, decreased balance, decreased knowledge of use of DME, decreased mobility,  difficulty walking, decreased ROM, decreased strength, hypomobility, increased muscle spasms, impaired flexibility, and pain.   ACTIVITY LIMITATIONS: sitting, standing, squatting, sleeping, stairs, transfers, bed mobility, and locomotion level  PARTICIPATION LIMITATIONS: driving, shopping, community activity, yard work, and church  PERSONAL FACTORS: Age, Behavior pattern, Education, Fitness, Past/current experiences, Social background, and Time since onset of injury/illness/exacerbation are also affecting patient's functional outcome.   REHAB POTENTIAL: Good  CLINICAL DECISION MAKING: Stable/uncomplicated  EVALUATION COMPLEXITY: Low   GOALS: Goals  reviewed with patient? No  SHORT TERM GOALS: Target date: 10/07/2023   Will be compliant with appropriate progressive HEP Goal status: updated, 09/14/2023  2. Lt knee ROM 5-85* Goal status: updated, 09/20/2023  3. Will be independent with edema management strategies  Goal status: updated, 09/20/2023   LONG TERM GOALS: Target date: 11/11/2023   Patient will demonstrate Lt knee MMT WFL throughout to faciltiate usual transfers, stairs, squatting at PLOF for daily life.  Goal status: updated, 09/14/2023  2. L Knee flexion AROM -5 to 90* Goal status: updated, 09/14/2023  3. Will be able to ascend and descend stairs reciprocally with no increase in pain Goal status: updated, 09/14/2023  4. Will be able to ambulate community distances and perform all household tasks with no increase in pain w/ LRAD  Goal status: updated, 09/14/2023  5.  Patient will demonstrate Patient specific functional scale >5 to indicate reduced disability due to condition.  Goal status: updated, 09/14/2023  6. L Knee flexion PROM -2 to 100* Goal status: updated, 09/14/2023   PLAN:  PT FREQUENCY:  4-5x/week for 2 weeks then 2-3x/week for 6 weeks  PT DURATION: 8 weeks  PLANNED INTERVENTIONS: 97750- Physical Performance Testing, 97110-Therapeutic exercises, 97530- Therapeutic activity, [id number]- Neuromuscular re-education, 97535- Self Care, [id number]- Manual therapy, [id number]- Gait training, and [id number]- Vasopneumatic device  PLAN FOR NEXT SESSION: flexion/extension gains, gait training, quad activation    Burnard CHRISTELLA Meth, PT, DPT 09/26/2023, 3:58 PM

## 2023-09-23 NOTE — Telephone Encounter (Signed)
 IC advised.

## 2023-09-26 ENCOUNTER — Ambulatory Visit: Admitting: Physical Therapy

## 2023-09-26 ENCOUNTER — Ambulatory Visit (INDEPENDENT_AMBULATORY_CARE_PROVIDER_SITE_OTHER)

## 2023-09-26 DIAGNOSIS — M25662 Stiffness of left knee, not elsewhere classified: Secondary | ICD-10-CM

## 2023-09-26 DIAGNOSIS — M25562 Pain in left knee: Secondary | ICD-10-CM | POA: Diagnosis not present

## 2023-09-26 DIAGNOSIS — G8929 Other chronic pain: Secondary | ICD-10-CM | POA: Diagnosis not present

## 2023-09-26 DIAGNOSIS — M6281 Muscle weakness (generalized): Secondary | ICD-10-CM

## 2023-09-26 DIAGNOSIS — R600 Localized edema: Secondary | ICD-10-CM | POA: Diagnosis not present

## 2023-09-26 DIAGNOSIS — R262 Difficulty in walking, not elsewhere classified: Secondary | ICD-10-CM | POA: Diagnosis not present

## 2023-09-26 DIAGNOSIS — R2689 Other abnormalities of gait and mobility: Secondary | ICD-10-CM

## 2023-09-27 ENCOUNTER — Ambulatory Visit (INDEPENDENT_AMBULATORY_CARE_PROVIDER_SITE_OTHER): Admitting: Rehabilitative and Restorative Service Providers"

## 2023-09-27 ENCOUNTER — Encounter: Payer: Self-pay | Admitting: Rehabilitative and Restorative Service Providers"

## 2023-09-27 DIAGNOSIS — R2681 Unsteadiness on feet: Secondary | ICD-10-CM

## 2023-09-27 DIAGNOSIS — M6281 Muscle weakness (generalized): Secondary | ICD-10-CM | POA: Diagnosis not present

## 2023-09-27 DIAGNOSIS — M25662 Stiffness of left knee, not elsewhere classified: Secondary | ICD-10-CM

## 2023-09-27 DIAGNOSIS — R600 Localized edema: Secondary | ICD-10-CM | POA: Diagnosis not present

## 2023-09-27 DIAGNOSIS — R2689 Other abnormalities of gait and mobility: Secondary | ICD-10-CM | POA: Diagnosis not present

## 2023-09-27 DIAGNOSIS — M25562 Pain in left knee: Secondary | ICD-10-CM | POA: Diagnosis not present

## 2023-09-27 DIAGNOSIS — R262 Difficulty in walking, not elsewhere classified: Secondary | ICD-10-CM | POA: Diagnosis not present

## 2023-09-27 DIAGNOSIS — G8929 Other chronic pain: Secondary | ICD-10-CM | POA: Diagnosis not present

## 2023-09-27 NOTE — Therapy (Signed)
 OUTPATIENT PHYSICAL THERAPY TREATMENT    Patient Name: Patricia Hoover MRN: [id number] DOB:1945-02-08, 78 y.o., female Today's Date: 09/27/2023      END OF SESSION:  PT End of Session - 09/27/23 1334     Visit Number 15    Number of Visits 30    Date for PT Re-Evaluation 11/11/23    Authorization Type MCR    Authorization Time Period 08/12/23 to 10/07/23    Progress Note Due on Visit 18    PT Start Time 1340    PT Stop Time 1431    PT Time Calculation (min) 51 min    Activity Tolerance Patient tolerated treatment well    Behavior During Therapy [facility] for tasks assessed/performed               Past Medical History:  Diagnosis Date   Angioedema 06/15/2017   Arrhythmia    possible hx, resolved prev   Blood transfusion without reported diagnosis 1972   had reaction; had to stop   Carpal tunnel syndrome    Cataract 2019   resolved with surgery   Complication of anesthesia    takes a long to wake up   Duodenal ulcer    H/O exercise stress test 2012   normal   Heart murmur    as child   Hematochezia    High cholesterol    History of kidney stones    Hx of colonic polyps    Hypertension    Osteoporosis 2010   t score - 3.9   Peptic ulcer of duodenum    Renal stones    Vitamin D  deficiency 03/01/2021   Past Surgical History:  Procedure Laterality Date   ABDOMINAL HYSTERECTOMY  1972   for endometriosis   CATARACT EXTRACTION W/ INTRAOCULAR LENS IMPLANT Right 06/2017   CATARACT EXTRACTION W/ INTRAOCULAR LENS IMPLANT Left 07/2017   COLONOSCOPY Left 03/02/2017   Procedure: COLONOSCOPY;  Surgeon: Janalyn Keene NOVAK, MD;  Location: [facility] ENDOSCOPY;  Service: Endoscopy;  Laterality: Left;   COLONOSCOPY WITH PROPOFOL  N/A 05/04/2016   Procedure: COLONOSCOPY WITH PROPOFOL ;  Surgeon: Ruel Kung, MD;  Location: [facility] ENDOSCOPY;  Service: Endoscopy;  Laterality: N/A;   ESOPHAGOGASTRODUODENOSCOPY Left 03/02/2017   Procedure: ESOPHAGOGASTRODUODENOSCOPY (EGD);  Surgeon: Janalyn Keene NOVAK, MD;  Location: [facility] ENDOSCOPY;  Service: Endoscopy;  Laterality: Left;   ESOPHAGOGASTRODUODENOSCOPY (EGD) WITH PROPOFOL  N/A 04/08/2017   Procedure: ESOPHAGOGASTRODUODENOSCOPY (EGD) WITH PROPOFOL ;  Surgeon: Janalyn Keene NOVAK, MD;  Location: [facility] ENDOSCOPY;  Service: Endoscopy;  Laterality: N/A;   EXAM UNDER ANESTHESIA WITH MANIPULATION OF KNEE Left 09/13/2023   Procedure: MANIPULATION, JOINT, KNEE, WITH ANESTHESIA;  Surgeon: Addie Cordella Hamilton, MD;  Location: [facility] OR;  Service: Orthopedics;  Laterality: Left;   LITHOTRIPSY     for kidney stone x5   LITHOTRIPSY  09/2017   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   Procedure: ARTHROPLASTY, KNEE, TOTAL, LEFT;  Surgeon: Addie Cordella Hamilton, MD;  Location: [place] OR;  Service: Orthopedics;  Laterality: Left;   Patient Active Problem List   Diagnosis Date Noted   Arthrofibrosis of knee joint, left 09/18/2023   Arthritis of left knee 07/26/2023   Lower GI bleeding 06/29/2023   Hypokalemia 03/09/2023   Aortic atherosclerosis (HCC) 03/09/2023   Unilateral primary osteoarthritis, left knee 11/26/2022   Dysuria 04/21/2022   Stiff-legged gait 04/21/2022   Scalp lesion 09/21/2021   Clavicle enlargement 04/05/2021   Hyperpigmentation 03/01/2021   Vitamin D  deficiency 03/01/2021   Leg pain 05/21/2019   Right leg  pain 05/21/2019   GI bleed 03/04/2018   Healthcare maintenance 01/08/2018   Paresthesia 01/08/2018   Neck pain 08/31/2017   Angioedema 06/15/2017   External hemorrhoids    Diverticulosis of large intestine without diverticulitis    Internal hemorrhoids    Advance care planning 12/23/2016   Renal stone 04/20/2016   Heartburn 12/17/2015   Medicare annual wellness visit, subsequent 06/05/2013   Hyperglycemia 06/05/2013   Knee pain 08/31/2012   Age-related osteoporosis without current pathological fracture 08/18/2012   HTN (hypertension) 08/09/2012   HLD (hyperlipidemia) 08/09/2012    PCP: Cleatus Molly MD   REFERRING  PROVIDER: Addie Cordella Hamilton, MD  REFERRING DIAG: Diagnosis [phone] (ICD-10-CM) - Chronic pain of left knee M17.12 (ICD-10-CM) - Unilateral primary osteoarthritis, left knee  THERAPY DIAG:  Stiffness of left knee, not elsewhere classified  Chronic pain of left knee  Difficulty in walking, not elsewhere classified  Muscle weakness (generalized)  Other abnormalities of gait and mobility  Localized edema  Unsteadiness on feet  Rationale for Evaluation and Treatment: Rehabilitation  ONSET DATE: Lt TKR 07/26/23  SUBJECTIVE:   SUBJECTIVE STATEMENT:   Pt inidcated no specific complaints worsened since last visit.  Just mainly tightness.  Maybe some improvement reported on tightness.    PERTINENT HISTORY: Left TKA 07/26/2023, OA, osteoporosis, heart murmur, HTN  PAIN:  NPRS scale: no pain at rest  Pain location: Lt knee  Pain description:  stiffness, tightness.  Aggravating factors: bending it  Relieving factors: nothing besides pain pills   PRECAUTIONS: None  RED FLAGS: None   WEIGHT BEARING RESTRICTIONS: No  FALLS:  Has patient fallen in last 6 months? No  LIVING ENVIRONMENT: Lives with: lives with their family Lives in: House/apartment Stairs:  flight inside but does not have to go up  Has following equipment at home: Vannie - 2 wheeled  OCCUPATION: retiredWater engineer at an [employer]   PLOF: Independent, Independent with basic ADLs, Independent with gait, and Independent with transfers  PATIENT GOALS: be able to line dance   NEXT MD VISIT: Referring 09/23/2023  OBJECTIVE:  Note: Objective measures were completed at Evaluation unless otherwise noted.  PATIENT SURVEYS:  PSFS: THE PATIENT SPECIFIC FUNCTIONAL SCALE  Place score of 0-10 (0 = unable to perform activity and 10 = able to perform activity at the same level as before injury or problem)  Activity Date: 08/12/23 09/14/23    Bending knee  5 1   2. Lifting leg  5 1   3.walking  2   4.  Standing for ADLs  2   Total Score 5 1.5     Total Score = Sum of activity scores/number of activities  Minimally Detectable Change: 3 points (for single activity); 2 points (for average score)   COGNITION: 08/12/2023 Overall cognitive status: Within functional limits for tasks assessed     SENSATION: 08/12/2023 [facility]  EDEMA:  08/12/2023 Localized edema appropriate for post-op state    LOWER EXTREMITY ROM:  Active ROM Left eval Left  08/22/23 Left  08/24/23 Left 09/01/23 Left  09/14/23 Left 09/20/23  Hip flexion        Hip extension        Hip abduction        Hip adduction        Hip internal rotation        Hip external rotation        Knee flexion 67 64* 73* 62 in supine AROM heel slide  721 in PROM heel  slide Supine 90/90 position A: 68* Supine A: 60 P: 65 Sitting:  P: 70  Knee extension -9 -5* -5* -8 in seated AROM LAQ  Supine -9* Supine P: -5  Ankle dorsiflexion        Ankle plantarflexion        Ankle inversion        Ankle eversion         (Blank rows = not tested)  LOWER EXTREMITY MMT:  MMT Left Eval 08/12/2023 Left 09/14/23  Hip flexion    Hip extension    Hip abduction    Hip adduction    Hip internal rotation    Hip external rotation    Knee flexion 3+ 3-/5  Knee extension 3+ 3-/5  Ankle dorsiflexion    Ankle plantarflexion    Ankle inversion    Ankle eversion     (Blank rows = not tested)    GAIT: 08/30/2023: Ambulation in clinic c SPC in Rt UE with SBA.  Reduced knee extension in stance, maintaining knee flexion.   08/12/2023 Distance walked: in clinic distances  Assistive device utilized: Environmental consultant - 2 wheeled Level of assistance: Modified independence Comments: antalgic, limited knee ROM during gait cycle                  TREATMENT       DATE: 09/27/2023 Therex: UBE UE/LE for knee ROM to tolerance partial circles mainly with some full mixed in at times- 8 mins  , seat 5 Incline gastroc stretch 30 sec x 3 bilateral  Seated quad set  5 sec hold x 10 Lt leg Seated quad set with SLR slowly 3 x 5 Lt leg    TherActivity: Leg press double leg 62 lbs with focus on slow lowering into available flexion x 15 Leg press single leg 37 lbs Lt  x 15 with focus on slow lowering into available flexion  Manual: Supine Lt knee flexion mobilization c movement distraction/IR.  Contract relax for flexion mobility.    Vaso:  Lt LE elevated on wedge with medium compression for 10 minutes at 34deg    TREATMENT       DATE: 09/26/2023 TherEx:  Scifit, seat 5, level 1, for 8 minutes. Partial circles 6 min Hip flexor stretch with strap, 3x30 seconds  TherAct TKE against green resistance band, 2x10. To help with complete weight shift  when walking Heel slides in seated  for inc flexion with ambulation Neuro Re-Ed Quad sets for attempted quad activation in Lt LE.  Mini squats in // bars- added to HEP Manual Supine with heel prop, grade 3 extension mobs for knee extension. Over pressure knee flexion   Vaso  Lt LE elevated on wedge with medium compression for 10 minutes at 34deg     TREATMENT       DATE: 09/22/2023 TherEx:  Scifit, seat 6, level 1, for 8 minutes. Partial circles for 5 minutes and occasional bwd/fwd full revolution circles for the last 3 minutes Hip flexor stretch with strap, 3x30 seconds  TherAct TKE against red resistance band, 1x10. To help with complete weight shift when walking TKE against green resistance band, 1x10. To help with complete weight shift  when walking PT demo & verbal cues on TKE as HEP with HO.  Pt verbalized understanding.   Neuro Re-Ed Quad sets for attempted quad activation in Lt LE. Tactile and verbal cues used. Attempted quad activation in Rt LE first and then at the same time as the Lt  LE to get quad activation with minimal results. Patient had most quad activation in Lt LE when rolling up a towel under her knee to push against  Manual Supine with heel prop, grade 3 extension mobs for  knee extension. Moderate discomfort from patient Over pressure knee flexion   Gait Training PT demo & verbal cues on knee flexion for swing initiating in terminal stance and knee ext in stance.  Pt able to return demo understanding mimicking PT ambulating with cane.    Vaso  Lt LE elevated on wedge with medium compression for 10 minutes at 34deg   TREATMENT       DATE: 09/21/2023 TherEx:  UBE with bilat UE and LE level 1 for 8 minutes with partial revolutions this date  Seated AAROM knee flexion 1x10 with 10s holds  Verbal cues required for appropriate hip placement with knee flexion as well as appropriate hamstring contraction Seated AAROM knee extension 1x6 with 5-10s holds  Seated SAQ with foam roller under knee 1x10 with 5s holds  Verbal cues for appropriate form with PT discussing importance of quad contraction, how it changes with edema and pain/discomfort PT discussed HEP, printed another handout, and walked patient through how to open activities through the website   Self-Care:  PT spending extensive time discussing typical soreness, especially in the morning time, as well as how to combat with mobility to include knee flexion and extension, not just ambulation   Vaso:   Lt LE elevated on wedge with medium compression for 10 minutes at 34deg   PATIENT EDUCATION:  Education details: exam findings, POC, course of care following TKR  Person educated: Patient Education method: Explanation and Demonstration Education comprehension: verbalized understanding, returned demonstration, and needs further education  HOME EXERCISE PROGRAM: Access Code: [email or web address] URL: [email or web address] Date: 09/22/2023 Prepared by: Ismael Theophilus Stallion Access Code: [email or web address] URL: [email or web address] Date: 09/26/2023 Prepared by: Burnard Meth  Exercises - Supine Heel Slide with Strap  - 5 x daily - 7 x weekly - 1 sets - 10 reps - 10 seconds hold - Supine Knee  Extension Stretch on Towel Roll  - 1 x daily - 7 x weekly - 3 sets - 10 reps - Seated Knee Flexion Extension AROM   - 1 x daily - 7 x weekly - 3 sets - 10 reps - Seated Knee Flexion AAROM  - 1 x daily - 7 x weekly - 3 sets - 10 reps - Supine Short Arc Quad  - 1 x daily - 7 x weekly - 3 sets - 10 reps - Standing Terminal Knee Extension with Resistance  - 1 x daily - 7 x weekly - 2-3 sets - 10 reps - 5 seconds hold - Mini Squat with Counter Support  - 2 x daily - 7 x weekly - 2 sets - 10 reps - 5 hold  ASSESSMENT:  CLINICAL IMPRESSION:   Pt to continue to benefit from combination of active mobility and manual intervention to try to promote gains in extension and flexion mobility Lt knee.  Some mild quality improvements noted in gait with some improved use of available flexion in swing noted.    OBJECTIVE IMPAIRMENTS: Abnormal gait, decreased activity tolerance, decreased balance, decreased knowledge of use of DME, decreased mobility, difficulty walking, decreased ROM, decreased strength, hypomobility, increased muscle spasms, impaired flexibility, and pain.   ACTIVITY LIMITATIONS: sitting, standing, squatting, sleeping, stairs, transfers, bed mobility, and locomotion level  PARTICIPATION LIMITATIONS: driving, shopping, community activity, yard work, and church  PERSONAL FACTORS: Age, Behavior pattern, Education, Fitness, Past/current experiences, Social background, and Time since onset of injury/illness/exacerbation are also affecting patient's functional outcome.   REHAB POTENTIAL: Good  CLINICAL DECISION MAKING: Stable/uncomplicated  EVALUATION COMPLEXITY: Low   GOALS: Goals reviewed with patient? No  SHORT TERM GOALS: Target date: 10/07/2023   Will be compliant with appropriate progressive HEP Goal status: updated, 09/14/2023  2. Lt knee ROM 5-85* Goal status: updated, 09/20/2023  3. Will be independent with edema management strategies  Goal status: updated, 09/20/2023   LONG  TERM GOALS: Target date: 11/11/2023   Patient will demonstrate Lt knee MMT WFL throughout to faciltiate usual transfers, stairs, squatting at PLOF for daily life.  Goal status: updated, 09/14/2023  2. L Knee flexion AROM -5 to 90* Goal status: updated, 09/14/2023  3. Will be able to ascend and descend stairs reciprocally with no increase in pain Goal status: updated, 09/14/2023  4. Will be able to ambulate community distances and perform all household tasks with no increase in pain w/ LRAD  Goal status: updated, 09/14/2023  5.  Patient will demonstrate Patient specific functional scale >5 to indicate reduced disability due to condition.  Goal status: updated, 09/14/2023  6. L Knee flexion PROM -2 to 100* Goal status: updated, 09/14/2023   PLAN:  PT FREQUENCY:  4-5x/week for 2 weeks then 2-3x/week for 6 weeks  PT DURATION: 8 weeks  PLANNED INTERVENTIONS: 97750- Physical Performance Testing, 97110-Therapeutic exercises, 97530- Therapeutic activity, [id number]- Neuromuscular re-education, 97535- Self Care, [id number]- Manual therapy, [id number]- Gait training, and [id number]- Vasopneumatic device  PLAN FOR NEXT SESSION: MD update note for visit on Sept 10th.    Ozell Silvan, PT, DPT, OCS, ATC 09/27/23  2:25 PM

## 2023-09-28 ENCOUNTER — Ambulatory Visit (INDEPENDENT_AMBULATORY_CARE_PROVIDER_SITE_OTHER): Admitting: Orthopedic Surgery

## 2023-09-28 ENCOUNTER — Encounter: Payer: Self-pay | Admitting: Rehabilitative and Restorative Service Providers"

## 2023-09-28 ENCOUNTER — Encounter: Payer: Self-pay | Admitting: Orthopedic Surgery

## 2023-09-28 ENCOUNTER — Ambulatory Visit (INDEPENDENT_AMBULATORY_CARE_PROVIDER_SITE_OTHER): Admitting: Rehabilitative and Restorative Service Providers"

## 2023-09-28 DIAGNOSIS — R262 Difficulty in walking, not elsewhere classified: Secondary | ICD-10-CM

## 2023-09-28 DIAGNOSIS — M25562 Pain in left knee: Secondary | ICD-10-CM

## 2023-09-28 DIAGNOSIS — R2689 Other abnormalities of gait and mobility: Secondary | ICD-10-CM | POA: Diagnosis not present

## 2023-09-28 DIAGNOSIS — M25662 Stiffness of left knee, not elsewhere classified: Secondary | ICD-10-CM

## 2023-09-28 DIAGNOSIS — R600 Localized edema: Secondary | ICD-10-CM

## 2023-09-28 DIAGNOSIS — G8929 Other chronic pain: Secondary | ICD-10-CM

## 2023-09-28 DIAGNOSIS — M6281 Muscle weakness (generalized): Secondary | ICD-10-CM

## 2023-09-28 DIAGNOSIS — R2681 Unsteadiness on feet: Secondary | ICD-10-CM

## 2023-09-28 DIAGNOSIS — Z96652 Presence of left artificial knee joint: Secondary | ICD-10-CM

## 2023-09-28 NOTE — Progress Notes (Signed)
 Post-Op Visit Note   Patient: Patricia Hoover           Date of Birth: 09/21/1945           MRN: [id number] Visit Date: 09/28/2023 PCP: Cleatus Arlyss RAMAN, MD   Assessment & Plan:  Chief Complaint:  Chief Complaint  Patient presents with   Left Knee - Routine Post Op    LEFT MUA (surgery date 09-13-23)   Visit Diagnoses:  1. Status post total left knee replacement     Plan: Paetyn is now about 2 weeks out left knee manipulation under anesthesia for arthrofibrosis following total knee replacement.  We did achieve about 120 of flexion at the time of manipulation.  Patient was started on physical therapy daily as well as CPM machine.  On examination today range of motion is about 10-70.  Continue therapy here 2 times a week for 4 weeks.  No further intervention indicated.  6-week return for final recheck.  Follow-Up Instructions: Return in about 6 weeks (around 11/09/2023).   Orders:  Orders Placed This Encounter  Procedures   Ambulatory referral to Physical Therapy   No orders of the defined types were placed in this encounter.   Imaging: No results found.  PMFS History: Patient Active Problem List   Diagnosis Date Noted   Arthrofibrosis of knee joint, left 09/18/2023   Arthritis of left knee 07/26/2023   Lower GI bleeding 06/29/2023   Hypokalemia 03/09/2023   Aortic atherosclerosis (HCC) 03/09/2023   Unilateral primary osteoarthritis, left knee 11/26/2022   Dysuria 04/21/2022   Stiff-legged gait 04/21/2022   Scalp lesion 09/21/2021   Clavicle enlargement 04/05/2021   Hyperpigmentation 03/01/2021   Vitamin D  deficiency 03/01/2021   Leg pain 05/21/2019   Right leg pain 05/21/2019   GI bleed 03/04/2018   Healthcare maintenance 01/08/2018   Paresthesia 01/08/2018   Neck pain 08/31/2017   Angioedema 06/15/2017   External hemorrhoids    Diverticulosis of large intestine without diverticulitis    Internal hemorrhoids    Advance care planning 12/23/2016   Renal stone  04/20/2016   Heartburn 12/17/2015   Medicare annual wellness visit, subsequent 06/05/2013   Hyperglycemia 06/05/2013   Knee pain 08/31/2012   Age-related osteoporosis without current pathological fracture 08/18/2012   HTN (hypertension) 08/09/2012   HLD (hyperlipidemia) 08/09/2012   Past Medical History:  Diagnosis Date   Angioedema 06/15/2017   Arrhythmia    possible hx, resolved prev   Blood transfusion without reported diagnosis 1972   had reaction; had to stop   Carpal tunnel syndrome    Cataract 2019   resolved with surgery   Complication of anesthesia    takes a long to wake up   Duodenal ulcer    H/O exercise stress test 2012   normal   Heart murmur    as child   Hematochezia    High cholesterol    History of kidney stones    Hx of colonic polyps    Hypertension    Osteoporosis 2010   t score - 3.9   Peptic ulcer of duodenum    Renal stones    Vitamin D  deficiency 03/01/2021    Family History  Problem Relation Age of Onset   Heart disease Mother    Renal Disease Mother    Heart failure Mother    Colon cancer Neg Hx    Breast cancer Neg Hx     Past Surgical History:  Procedure Laterality Date   ABDOMINAL HYSTERECTOMY  1972   for endometriosis   CATARACT EXTRACTION W/ INTRAOCULAR LENS IMPLANT Right 06/2017   CATARACT EXTRACTION W/ INTRAOCULAR LENS IMPLANT Left 07/2017   COLONOSCOPY Left 03/02/2017   Procedure: COLONOSCOPY;  Surgeon: Janalyn Keene NOVAK, MD;  Location: [facility] ENDOSCOPY;  Service: Endoscopy;  Laterality: Left;   COLONOSCOPY WITH PROPOFOL  N/A 05/04/2016   Procedure: COLONOSCOPY WITH PROPOFOL ;  Surgeon: Ruel Kung, MD;  Location: [facility] ENDOSCOPY;  Service: Endoscopy;  Laterality: N/A;   ESOPHAGOGASTRODUODENOSCOPY Left 03/02/2017   Procedure: ESOPHAGOGASTRODUODENOSCOPY (EGD);  Surgeon: Janalyn Keene NOVAK, MD;  Location: [facility] ENDOSCOPY;  Service: Endoscopy;  Laterality: Left;   ESOPHAGOGASTRODUODENOSCOPY (EGD) WITH PROPOFOL  N/A 04/08/2017    Procedure: ESOPHAGOGASTRODUODENOSCOPY (EGD) WITH PROPOFOL ;  Surgeon: Janalyn Keene NOVAK, MD;  Location: [facility] ENDOSCOPY;  Service: Endoscopy;  Laterality: N/A;   EXAM UNDER ANESTHESIA WITH MANIPULATION OF KNEE Left 09/13/2023   Procedure: MANIPULATION, JOINT, KNEE, WITH ANESTHESIA;  Surgeon: Addie Cordella Hamilton, MD;  Location: [facility] OR;  Service: Orthopedics;  Laterality: Left;   LITHOTRIPSY     for kidney stone x5   LITHOTRIPSY  09/2017   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   Procedure: ARTHROPLASTY, KNEE, TOTAL, LEFT;  Surgeon: Addie Cordella Hamilton, MD;  Location: [facility] OR;  Service: Orthopedics;  Laterality: Left;   Social History   Occupational History   Occupation: Retired    Comment: Product manager  Tobacco Use   Smoking status: Former    Current packs/day: 0.00    Types: Cigarettes    Quit date: 01/19/1991    Years since quitting: 32.7   Smokeless tobacco: Never  Vaping Use   Vaping status: Never Used  Substance and Sexual Activity   Alcohol use: No    Comment: rare wine   Drug use: No   Sexual activity: Not on file

## 2023-09-28 NOTE — Therapy (Signed)
 OUTPATIENT PHYSICAL THERAPY TREATMENT / PROGRESS NOTE   Patient Name: Patricia Hoover MRN: [id number] DOB:02/28/1945, 78 y.o., female Today's Date: 09/28/2023    Progress Note Reporting Period 09/14/2023 to 09/28/2023  See note below for Objective Data and Assessment of Progress/Goals.      END OF SESSION:  PT End of Session - 09/28/23 1302     Visit Number 17    Number of Visits 30    Date for PT Re-Evaluation 11/11/23    Authorization Type MCR    Authorization Time Period --    Progress Note Due on Visit 27    PT Start Time 1301    PT Stop Time 1341    PT Time Calculation (min) 40 min    Activity Tolerance Patient tolerated treatment well    Behavior During Therapy WFL for tasks assessed/performed               Past Medical History:  Diagnosis Date   Angioedema 06/15/2017   Arrhythmia    possible hx, resolved prev   Blood transfusion without reported diagnosis 1972   had reaction; had to stop   Carpal tunnel syndrome    Cataract 2019   resolved with surgery   Complication of anesthesia    takes a long to wake up   Duodenal ulcer    H/O exercise stress test 2012   normal   Heart murmur    as child   Hematochezia    High cholesterol    History of kidney stones    Hx of colonic polyps    Hypertension    Osteoporosis 2010   t score - 3.9   Peptic ulcer of duodenum    Renal stones    Vitamin D  deficiency 03/01/2021   Past Surgical History:  Procedure Laterality Date   ABDOMINAL HYSTERECTOMY  1972   for endometriosis   CATARACT EXTRACTION W/ INTRAOCULAR LENS IMPLANT Right 06/2017   CATARACT EXTRACTION W/ INTRAOCULAR LENS IMPLANT Left 07/2017   COLONOSCOPY Left 03/02/2017   Procedure: COLONOSCOPY;  Surgeon: Janalyn Keene NOVAK, MD;  Location: [facility] ENDOSCOPY;  Service: Endoscopy;  Laterality: Left;   COLONOSCOPY WITH PROPOFOL  N/A 05/04/2016   Procedure: COLONOSCOPY WITH PROPOFOL ;  Surgeon: Ruel Kung, MD;  Location: [facility] ENDOSCOPY;  Service: Endoscopy;   Laterality: N/A;   ESOPHAGOGASTRODUODENOSCOPY Left 03/02/2017   Procedure: ESOPHAGOGASTRODUODENOSCOPY (EGD);  Surgeon: Janalyn Keene NOVAK, MD;  Location: [facility] ENDOSCOPY;  Service: Endoscopy;  Laterality: Left;   ESOPHAGOGASTRODUODENOSCOPY (EGD) WITH PROPOFOL  N/A 04/08/2017   Procedure: ESOPHAGOGASTRODUODENOSCOPY (EGD) WITH PROPOFOL ;  Surgeon: Janalyn Keene NOVAK, MD;  Location: [facility] ENDOSCOPY;  Service: Endoscopy;  Laterality: N/A;   EXAM UNDER ANESTHESIA WITH MANIPULATION OF KNEE Left 09/13/2023   Procedure: MANIPULATION, JOINT, KNEE, WITH ANESTHESIA;  Surgeon: Addie Cordella Hamilton, MD;  Location: 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   Procedure: ARTHROPLASTY, KNEE, TOTAL, LEFT;  Surgeon: Addie Cordella Hamilton, MD;  Location: [facility] OR;  Service: Orthopedics;  Laterality: Left;   Patient Active Problem List   Diagnosis Date Noted   Arthrofibrosis of knee joint, left 09/18/2023   Arthritis of left knee 07/26/2023   Lower GI bleeding 06/29/2023   Hypokalemia 03/09/2023   Aortic atherosclerosis (HCC) 03/09/2023   Unilateral primary osteoarthritis, left knee 11/26/2022   Dysuria 04/21/2022   Stiff-legged gait 04/21/2022   Scalp lesion 09/21/2021   Clavicle  enlargement 04/05/2021   Hyperpigmentation 03/01/2021   Vitamin D  deficiency 03/01/2021   Leg pain 05/21/2019   Right leg pain 05/21/2019   GI bleed 03/04/2018   Healthcare maintenance 01/08/2018   Paresthesia 01/08/2018   Neck pain 08/31/2017   Angioedema 06/15/2017   External hemorrhoids    Diverticulosis of large intestine without diverticulitis    Internal hemorrhoids    Advance care planning 12/23/2016   Renal stone 04/20/2016   Heartburn 12/17/2015   Medicare annual wellness visit, subsequent 06/05/2013   Hyperglycemia 06/05/2013   Knee pain 08/31/2012   Age-related osteoporosis without current pathological  fracture 08/18/2012   HTN (hypertension) 08/09/2012   HLD (hyperlipidemia) 08/09/2012    PCP: Cleatus Molly MD   REFERRING PROVIDER: Addie Cordella Hamilton, MD  REFERRING DIAG: Diagnosis [phone] (ICD-10-CM) - Chronic pain of left knee M17.12 (ICD-10-CM) - Unilateral primary osteoarthritis, left knee  THERAPY DIAG:  Stiffness of left knee, not elsewhere classified  Chronic pain of left knee  Difficulty in walking, not elsewhere classified  Muscle weakness (generalized)  Other abnormalities of gait and mobility  Localized edema  Unsteadiness on feet  Rationale for Evaluation and Treatment: Rehabilitation  ONSET DATE: Lt TKR 07/26/23  SUBJECTIVE:   SUBJECTIVE STATEMENT:   Pt indicated pain at worst in last week 5/10.  Pt indicated feeling like the stiffness has made some improvements compared to previous.  Reported nighttime stiffness can still be a complaint.    PERTINENT HISTORY: Left TKA 07/26/2023, OA, osteoporosis, heart murmur, HTN  PAIN:  NPRS scale: no pain at rest,  5/10 at worst Pain location: Lt knee  Pain description:  stiffness, tightness.  Aggravating factors: bending it  Relieving factors: nothing besides pain pills   PRECAUTIONS: None  RED FLAGS: None   WEIGHT BEARING RESTRICTIONS: No  FALLS:  Has patient fallen in last 6 months? No  LIVING ENVIRONMENT: Lives with: lives with their family Lives in: House/apartment Stairs:  flight inside but does not have to go up  Has following equipment at home: Vannie - 2 wheeled  OCCUPATION: retiredWater engineer at an [employer]   PLOF: Independent, Independent with basic ADLs, Independent with gait, and Independent with transfers  PATIENT GOALS: be able to line dance   NEXT MD VISIT: Referring 09/23/2023  OBJECTIVE:  Note: Objective measures were completed at Evaluation unless otherwise noted.  PATIENT SURVEYS:  PSFS: THE PATIENT SPECIFIC FUNCTIONAL SCALE  Place score of 0-10 (0 = unable  to perform activity and 10 = able to perform activity at the same level as before injury or problem)  Activity Date: 08/12/23 09/14/23 09/28/2023    Bending knee  5 1 5   2. Lifting leg  5 1 6   3.walking  2 8  4. Standing for ADLs  2 6  Total Score 5 1.5 6.25    Total Score = Sum of activity scores/number of activities  Minimally Detectable Change: 3 points (for single activity); 2 points (for average score)   COGNITION: 08/12/2023 Overall cognitive status: Within functional limits for tasks assessed     SENSATION: 08/12/2023 [facility]  EDEMA:  08/12/2023 Localized edema appropriate for post-op state    LOWER EXTREMITY ROM:  Active ROM Left eval Left  08/22/23 Left  08/24/23 Left 09/01/23 Left  09/14/23 Left 09/20/23 Left 09/28/2023  Hip flexion         Hip extension         Hip abduction         Hip adduction  Hip internal rotation         Hip external rotation         Knee flexion 67 64* 73* 62 in supine AROM heel slide  721 in PROM heel slide Supine 90/90 position A: 68* Supine A: 60 P: 65 Sitting:  P: 70 Supine AROM heel slide: 71  Supine PROM: 74  Knee extension -9 -5* -5* -8 in seated AROM LAQ  Supine -9* Supine P: -5 Seated -6 AROM in heel prop  Ankle dorsiflexion         Ankle plantarflexion         Ankle inversion         Ankle eversion          (Blank rows = not tested)  LOWER EXTREMITY MMT:  MMT Left Eval 08/12/2023 Left 09/14/23 Left 09/28/2023  Hip flexion   5/5  Hip extension     Hip abduction     Hip adduction     Hip internal rotation     Hip external rotation     Knee flexion 3+ 3-/5 4+/5  Knee extension 3+ 3-/5 4/5  Ankle dorsiflexion   5/5  Ankle plantarflexion     Ankle inversion     Ankle eversion      (Blank rows = not tested)    GAIT: 09/28/2023: Ambulation with SPC in Rt UE with some quality improvements in knee flexion in swing.  Lacking TKE still noted in stance.   08/30/2023: Ambulation in clinic c SPC in Rt UE with  SBA.  Reduced knee extension in stance, maintaining knee flexion.   08/12/2023 Distance walked: in clinic distances  Assistive device utilized: Environmental consultant - 2 wheeled Level of assistance: Modified independence Comments: antalgic, limited knee ROM during gait cycle                  TREATMENT       DATE: 09/28/2023 Therex: UBE UE/LE for knee ROM to tolerance partial circles mainly with full mixed in around 6 mins in - 8 mins total , seat 5 Seated knee extension with heel prop and 4# weight pulling knee into extension 60 seconds x 4 with tailgate flexion for 1 min between.  Glute bridge MWM for flexion gains 2 x 5  Seated Lt leg LAQ 3 x 10 for ROM  TherActivity: Mini squats with UE support   Manual: Supine 90/90 gravity assist knee flexion with overpressure into knee flexion/extension  Vaso:  Held due to MD visit.   TREATMENT       DATE: 09/27/2023 Therex: UBE UE/LE for knee ROM to tolerance partial circles mainly with some full mixed in at times- 8 mins  , seat 5 Incline gastroc stretch 30 sec x 3 bilateral  Seated quad set 5 sec hold x 10 Lt leg Seated quad set with SLR slowly 3 x 5 Lt leg    TherActivity: Leg press double leg 62 lbs with focus on slow lowering into available flexion x 15 Leg press single leg 37 lbs Lt  x 15 with focus on slow lowering into available flexion  Manual: Supine Lt knee flexion mobilization c movement distraction/IR.  Contract relax for flexion mobility.    Vaso:  Lt LE elevated on wedge with medium compression for 10 minutes at 34deg    TREATMENT       DATE: 09/26/2023 TherEx:  Scifit, seat 5, level 1, for 8 minutes. Partial circles 6 min Hip flexor stretch with strap, 3x30 seconds  TherAct TKE against green resistance band, 2x10. To help with complete weight shift  when walking Heel slides in seated  for inc flexion with ambulation Neuro Re-Ed Quad sets for attempted quad activation in Lt LE.  Mini squats in // bars- added to  HEP Manual Supine with heel prop, grade 3 extension mobs for knee extension. Over pressure knee flexion   Vaso  Lt LE elevated on wedge with medium compression for 10 minutes at 34deg  PATIENT EDUCATION:  Education details: exam findings, POC, course of care following TKR  Person educated: Patient Education method: Medical illustrator Education comprehension: verbalized understanding, returned demonstration, and needs further education  HOME EXERCISE PROGRAM: Access Code: [email or web address] URL: [email or web address] Date: 09/22/2023 Prepared by: Ismael Theophilus Stallion Access Code: [email or web address] URL: [email or web address] Date: 09/26/2023 Prepared by: Burnard Meth  Exercises - Supine Heel Slide with Strap  - 5 x daily - 7 x weekly - 1 sets - 10 reps - 10 seconds hold - Supine Knee Extension Stretch on Towel Roll  - 1 x daily - 7 x weekly - 3 sets - 10 reps - Seated Knee Flexion Extension AROM   - 1 x daily - 7 x weekly - 3 sets - 10 reps - Seated Knee Flexion AAROM  - 1 x daily - 7 x weekly - 3 sets - 10 reps - Supine Short Arc Quad  - 1 x daily - 7 x weekly - 3 sets - 10 reps - Standing Terminal Knee Extension with Resistance  - 1 x daily - 7 x weekly - 2-3 sets - 10 reps - 5 seconds hold - Mini Squat with Counter Support  - 2 x daily - 7 x weekly - 2 sets - 10 reps - 5 hold  ASSESSMENT:  CLINICAL IMPRESSION:   The patient has attended 17 visits over the course of treatment cycle.  See objective data above for updated information regarding current presentation.  Pt has shown some improvements in patient specific functional scale reassessment as well as strength.  Slow but gains noted in ambulation quality with SPC.  Extension and flexion end ranges are still impacted by tightness/symptoms.  Pt has demonstrated improvement in quality of movements within available range which has reduced functional limitations levels.     OBJECTIVE IMPAIRMENTS:  Abnormal gait, decreased activity tolerance, decreased balance, decreased knowledge of use of DME, decreased mobility, difficulty walking, decreased ROM, decreased strength, hypomobility, increased muscle spasms, impaired flexibility, and pain.   ACTIVITY LIMITATIONS: sitting, standing, squatting, sleeping, stairs, transfers, bed mobility, and locomotion level  PARTICIPATION LIMITATIONS: driving, shopping, community activity, yard work, and church  PERSONAL FACTORS: Age, Behavior pattern, Education, Fitness, Past/current experiences, Social background, and Time since onset of injury/illness/exacerbation are also affecting patient's functional outcome.   REHAB POTENTIAL: Good  CLINICAL DECISION MAKING: Stable/uncomplicated  EVALUATION COMPLEXITY: Low   GOALS: Goals reviewed with patient? No  SHORT TERM GOALS: Target date: 10/07/2023   Will be compliant with appropriate progressive HEP Goal status: updated, 09/14/2023  2. Lt knee ROM 5-85* Goal status: updated, 09/20/2023  3. Will be independent with edema management strategies  Goal status: updated, 09/20/2023   LONG TERM GOALS: Target date: 11/11/2023   Patient will demonstrate Lt knee MMT WFL throughout to faciltiate usual transfers, stairs, squatting at PLOF for daily life.  Goal status: updated, 09/28/2023  2. L Knee flexion AROM -5 to 90* Goal status: updated, 09/28/2023  3. Will be able to ascend and descend stairs reciprocally with no increase  in pain Goal status: updated, 09/28/2023  4. Will be able to ambulate community distances and perform all household tasks with no increase in pain w/ LRAD  Goal status: updated, 09/28/2023  5.  Patient will demonstrate Patient specific functional scale >5 to indicate reduced disability due to condition.  Goal status: updated, 09/28/2023  6. L Knee flexion PROM -2 to 100* Goal status: updated, 09/28/2023   PLAN:  PT FREQUENCY:  4-5x/week for 2 weeks then 2-3x/week for 6 weeks  PT  DURATION: 8 weeks  PLANNED INTERVENTIONS: 97750- Physical Performance Testing, 97110-Therapeutic exercises, 97530- Therapeutic activity, 97112- Neuromuscular re-education, 97535- Self Care, [id number]- Manual therapy, [id number]- Gait training, and [id number]- Vasopneumatic device  PLAN FOR NEXT SESSION: Follow up from MD visit.    Ozell Silvan, PT, DPT, OCS, ATC 09/28/23  1:52 PM

## 2023-09-29 ENCOUNTER — Ambulatory Visit (INDEPENDENT_AMBULATORY_CARE_PROVIDER_SITE_OTHER)

## 2023-09-29 DIAGNOSIS — M25562 Pain in left knee: Secondary | ICD-10-CM | POA: Diagnosis not present

## 2023-09-29 DIAGNOSIS — R2689 Other abnormalities of gait and mobility: Secondary | ICD-10-CM

## 2023-09-29 DIAGNOSIS — R262 Difficulty in walking, not elsewhere classified: Secondary | ICD-10-CM

## 2023-09-29 DIAGNOSIS — R600 Localized edema: Secondary | ICD-10-CM | POA: Diagnosis not present

## 2023-09-29 DIAGNOSIS — M25662 Stiffness of left knee, not elsewhere classified: Secondary | ICD-10-CM

## 2023-09-29 DIAGNOSIS — M6281 Muscle weakness (generalized): Secondary | ICD-10-CM

## 2023-09-29 DIAGNOSIS — G8929 Other chronic pain: Secondary | ICD-10-CM | POA: Diagnosis not present

## 2023-09-29 DIAGNOSIS — R2681 Unsteadiness on feet: Secondary | ICD-10-CM

## 2023-09-29 NOTE — Therapy (Signed)
 OUTPATIENT PHYSICAL THERAPY TREATMENT  Patient Name: Patricia Hoover MRN: [id number] DOB:1945/09/26, 78 y.o., female Today's Date: 09/29/2023     END OF SESSION:  PT End of Session - 09/29/23 1439     Visit Number 18    Number of Visits 30    Date for PT Re-Evaluation 11/11/23    Authorization Type MCR    Authorization Time Period 08/12/23 to 10/07/23    Progress Note Due on Visit 27    PT Start Time 1345    PT Stop Time 1448    PT Time Calculation (min) 63 min    Activity Tolerance Patient tolerated treatment well    Behavior During Therapy [facility] for tasks assessed/performed                Past Medical History:  Diagnosis Date   Angioedema 06/15/2017   Arrhythmia    possible hx, resolved prev   Blood transfusion without reported diagnosis 1972   had reaction; had to stop   Carpal tunnel syndrome    Cataract 2019   resolved with surgery   Complication of anesthesia    takes a long to wake up   Duodenal ulcer    H/O exercise stress test 2012   normal   Heart murmur    as child   Hematochezia    High cholesterol    History of kidney stones    Hx of colonic polyps    Hypertension    Osteoporosis 2010   t score - 3.9   Peptic ulcer of duodenum    Renal stones    Vitamin D  deficiency 03/01/2021   Past Surgical History:  Procedure Laterality Date   ABDOMINAL HYSTERECTOMY  1972   for endometriosis   CATARACT EXTRACTION W/ INTRAOCULAR LENS IMPLANT Right 06/2017   CATARACT EXTRACTION W/ INTRAOCULAR LENS IMPLANT Left 07/2017   COLONOSCOPY Left 03/02/2017   Procedure: COLONOSCOPY;  Surgeon: Janalyn Keene NOVAK, MD;  Location: [facility] ENDOSCOPY;  Service: Endoscopy;  Laterality: Left;   COLONOSCOPY WITH PROPOFOL  N/A 05/04/2016   Procedure: COLONOSCOPY WITH PROPOFOL ;  Surgeon: Ruel Kung, MD;  Location: [facility] ENDOSCOPY;  Service: Endoscopy;  Laterality: N/A;   ESOPHAGOGASTRODUODENOSCOPY Left 03/02/2017   Procedure: ESOPHAGOGASTRODUODENOSCOPY (EGD);  Surgeon: Janalyn Keene NOVAK, MD;  Location: [facility] ENDOSCOPY;  Service: Endoscopy;  Laterality: Left;   ESOPHAGOGASTRODUODENOSCOPY (EGD) WITH PROPOFOL  N/A 04/08/2017   Procedure: ESOPHAGOGASTRODUODENOSCOPY (EGD) WITH PROPOFOL ;  Surgeon: Janalyn Keene NOVAK, MD;  Location: [facility] ENDOSCOPY;  Service: Endoscopy;  Laterality: N/A;   EXAM UNDER ANESTHESIA WITH MANIPULATION OF KNEE Left 09/13/2023   Procedure: MANIPULATION, JOINT, KNEE, WITH ANESTHESIA;  Surgeon: Addie Cordella Hamilton, MD;  Location: [facility] OR;  Service: Orthopedics;  Laterality: Left;   LITHOTRIPSY     for kidney stone x5   LITHOTRIPSY  09/2017   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   Procedure: ARTHROPLASTY, KNEE, TOTAL, LEFT;  Surgeon: Addie Cordella Hamilton, MD;  Location: [facility] OR;  Service: Orthopedics;  Laterality: Left;   Patient Active Problem List   Diagnosis Date Noted   Arthrofibrosis of knee joint, left 09/18/2023   Arthritis of left knee 07/26/2023   Lower GI bleeding 06/29/2023   Hypokalemia 03/09/2023   Aortic atherosclerosis (HCC) 03/09/2023   Unilateral primary osteoarthritis, left knee 11/26/2022   Dysuria 04/21/2022   Stiff-legged gait 04/21/2022   Scalp lesion 09/21/2021   Clavicle enlargement 04/05/2021   Hyperpigmentation 03/01/2021   Vitamin D  deficiency 03/01/2021   Leg pain 05/21/2019   Right leg pain 05/21/2019  GI bleed 03/04/2018   Healthcare maintenance 01/08/2018   Paresthesia 01/08/2018   Neck pain 08/31/2017   Angioedema 06/15/2017   External hemorrhoids    Diverticulosis of large intestine without diverticulitis    Internal hemorrhoids    Advance care planning 12/23/2016   Renal stone 04/20/2016   Heartburn 12/17/2015   Medicare annual wellness visit, subsequent 06/05/2013   Hyperglycemia 06/05/2013   Knee pain 08/31/2012   Age-related osteoporosis without current pathological fracture 08/18/2012   HTN (hypertension) 08/09/2012   HLD (hyperlipidemia) 08/09/2012    PCP: Cleatus Molly MD   REFERRING  PROVIDER: Addie Cordella Hamilton, MD  REFERRING DIAG: Diagnosis [phone] (ICD-10-CM) - Chronic pain of left knee M17.12 (ICD-10-CM) - Unilateral primary osteoarthritis, left knee  THERAPY DIAG:  Stiffness of left knee, not elsewhere classified  Chronic pain of left knee  Difficulty in walking, not elsewhere classified  Muscle weakness (generalized)  Other abnormalities of gait and mobility  Localized edema  Unsteadiness on feet  Rationale for Evaluation and Treatment: Rehabilitation  ONSET DATE: Lt TKR 07/26/23  SUBJECTIVE:   SUBJECTIVE STATEMENT:   Pt reports stiffness at home with all activities.  Doctor visit went  fine  and she is to continue with PT as long as possible.  PERTINENT HISTORY: Left TKA 07/26/2023, OA, osteoporosis, heart murmur, HTN  PAIN:  NPRS scale: no pain at rest,  5/10 at worst Pain location: Lt knee  Pain description:  stiffness, tightness.  Aggravating factors: bending it  Relieving factors: nothing besides pain pills   PRECAUTIONS: None  RED FLAGS: None   WEIGHT BEARING RESTRICTIONS: No  FALLS:  Has patient fallen in last 6 months? No  LIVING ENVIRONMENT: Lives with: lives with their family Lives in: House/apartment Stairs:  flight inside but does not have to go up  Has following equipment at home: Vannie - 2 wheeled  OCCUPATION: retiredWater engineer at an [employer]   PLOF: Independent, Independent with basic ADLs, Independent with gait, and Independent with transfers  PATIENT GOALS: be able to line dance   NEXT MD VISIT: Referring 09/23/2023  OBJECTIVE:  Note: Objective measures were completed at Evaluation unless otherwise noted.  PATIENT SURVEYS:  PSFS: THE PATIENT SPECIFIC FUNCTIONAL SCALE  Place score of 0-10 (0 = unable to perform activity and 10 = able to perform activity at the same level as before injury or problem)  Activity Date: 08/12/23 09/14/23 09/28/2023    Bending knee  5 1 5   2. Lifting leg  5 1 6    3.walking  2 8  4. Standing for ADLs  2 6  Total Score 5 1.5 6.25    Total Score = Sum of activity scores/number of activities  Minimally Detectable Change: 3 points (for single activity); 2 points (for average score)   COGNITION: 08/12/2023 Overall cognitive status: Within functional limits for tasks assessed     SENSATION: 08/12/2023 [facility]  EDEMA:  08/12/2023 Localized edema appropriate for post-op state    LOWER EXTREMITY ROM:  Active ROM Left eval Left  08/22/23 Left  08/24/23 Left 09/01/23 Left  09/14/23 Left 09/20/23 Left 09/28/2023  Hip flexion         Hip extension         Hip abduction         Hip adduction         Hip internal rotation         Hip external rotation         Knee flexion 67 64*  73* 62 in supine AROM heel slide  721 in PROM heel slide Supine 90/90 position A: 68* Supine A: 60 P: 65 Sitting:  P: 70 Supine AROM heel slide: 71  Supine PROM: 74  Knee extension -9 -5* -5* -8 in seated AROM LAQ  Supine -9* Supine P: -5 Seated -6 AROM in heel prop  Ankle dorsiflexion         Ankle plantarflexion         Ankle inversion         Ankle eversion          (Blank rows = not tested)  LOWER EXTREMITY MMT:  MMT Left Eval 08/12/2023 Left 09/14/23 Left 09/28/2023  Hip flexion   5/5  Hip extension     Hip abduction     Hip adduction     Hip internal rotation     Hip external rotation     Knee flexion 3+ 3-/5 4+/5  Knee extension 3+ 3-/5 4/5  Ankle dorsiflexion   5/5  Ankle plantarflexion     Ankle inversion     Ankle eversion      (Blank rows = not tested)    GAIT: 09/28/2023: Ambulation with SPC in Rt UE with some quality improvements in knee flexion in swing.  Lacking TKE still noted in stance.   08/30/2023: Ambulation in clinic c SPC in Rt UE with SBA.  Reduced knee extension in stance, maintaining knee flexion.   08/12/2023 Distance walked: in clinic distances  Assistive device utilized: Environmental consultant - 2 wheeled Level of assistance:  Modified independence Comments: antalgic, limited knee ROM during gait cycle                  TREATMENT        09/29/23 Therex: UBE UE/LE for knee ROM to tolerance partial circles mainly with full mixed in around 6 mins in - 8 mins total , seat 5 Supine knee extension with heel prop and 4# weight pulling knee into extension 3 min SLR 20x Seated Lt leg LAQ 3 x 10 for ROM  TherActivity: Mini squats with UE support  Sit to stands  Manual: Passive extension      DATE: 09/28/2023 Therex: UBE UE/LE for knee ROM to tolerance partial circles mainly with full mixed in around 6 mins in - 8 mins total , seat 5 Seated knee extension with heel prop and 4# weight pulling knee into extension 60 seconds x 4 with tailgate flexion for 1 min between.  Glute bridge MWM for flexion gains 2 x 5  Seated Lt leg LAQ 3 x 10 for ROM  TherActivity: Mini squats with UE support   Manual: Supine 90/90 gravity assist knee flexion with overpressure into knee flexion/extension  Vaso:  Held due to MD visit.   TREATMENT       DATE: 09/27/2023 Therex: UBE UE/LE for knee ROM to tolerance partial circles mainly with some full mixed in at times- 8 mins  , seat 5 Incline gastroc stretch 30 sec x 3 bilateral  Seated quad set 5 sec hold x 10 Lt leg Seated quad set with SLR slowly 3 x 5 Lt leg    TherActivity: Leg press double leg 62 lbs with focus on slow lowering into available flexion x 15 Leg press single leg 37 lbs Lt  x 15 with focus on slow lowering into available flexion  Manual: Supine Lt knee flexion mobilization c movement distraction/IR.  Contract relax for flexion mobility.  Vaso:  Lt LE elevated on wedge with medium compression for 10 minutes at 34deg    TREATMENT       DATE: 09/26/2023 TherEx:  Scifit, seat 5, level 1, for 8 minutes. Partial circles 6 min Hip flexor stretch with strap, 3x30 seconds  TherAct TKE against green resistance band, 2x10. To help with complete weight shift   when walking Heel slides in seated  for inc flexion with ambulation Neuro Re-Ed Quad sets for attempted quad activation in Lt LE.  Mini squats in // bars- added to HEP Manual Supine with heel prop, grade 3 extension mobs for knee extension. Over pressure knee flexion   Vaso  Lt LE elevated on wedge with medium compression for 10 minutes at 34deg  PATIENT EDUCATION:  Education details: exam findings, POC, course of care following TKR  Person educated: Patient Education method: Medical illustrator Education comprehension: verbalized understanding, returned demonstration, and needs further education  HOME EXERCISE PROGRAM: Access Code: [email or web address] URL: [email or web address] Date: 09/22/2023 Prepared by: Ismael Theophilus Stallion Access Code: [email or web address] URL: [email or web address] Date: 09/26/2023 Prepared by: Burnard Meth  Exercises - Supine Heel Slide with Strap  - 5 x daily - 7 x weekly - 1 sets - 10 reps - 10 seconds hold - Supine Knee Extension Stretch on Towel Roll  - 1 x daily - 7 x weekly - 3 sets - 10 reps - Seated Knee Flexion Extension AROM   - 1 x daily - 7 x weekly - 3 sets - 10 reps - Seated Knee Flexion AAROM  - 1 x daily - 7 x weekly - 3 sets - 10 reps - Supine Short Arc Quad  - 1 x daily - 7 x weekly - 3 sets - 10 reps - Standing Terminal Knee Extension with Resistance  - 1 x daily - 7 x weekly - 2-3 sets - 10 reps - 5 seconds hold - Mini Squat with Counter Support  - 2 x daily - 7 x weekly - 2 sets - 10 reps - 5 hold  ASSESSMENT:  CLINICAL IMPRESSION:   Pt needed tactile cues to keep L hip down with knee flexion.  OBJECTIVE IMPAIRMENTS: Abnormal gait, decreased activity tolerance, decreased balance, decreased knowledge of use of DME, decreased mobility, difficulty walking, decreased ROM, decreased strength, hypomobility, increased muscle spasms, impaired flexibility, and pain.   ACTIVITY LIMITATIONS: sitting, standing,  squatting, sleeping, stairs, transfers, bed mobility, and locomotion level  PARTICIPATION LIMITATIONS: driving, shopping, community activity, yard work, and church  PERSONAL FACTORS: Age, Behavior pattern, Education, Fitness, Past/current experiences, Social background, and Time since onset of injury/illness/exacerbation are also affecting patient's functional outcome.   REHAB POTENTIAL: Good  CLINICAL DECISION MAKING: Stable/uncomplicated  EVALUATION COMPLEXITY: Low   GOALS: Goals reviewed with patient? No  SHORT TERM GOALS: Target date: 10/07/2023   Will be compliant with appropriate progressive HEP Goal status: updated, 09/14/2023  2. Lt knee ROM 5-85* Goal status: updated, 09/20/2023  3. Will be independent with edema management strategies  Goal status: updated, 09/20/2023   LONG TERM GOALS: Target date: 11/11/2023   Patient will demonstrate Lt knee MMT WFL throughout to faciltiate usual transfers, stairs, squatting at PLOF for daily life.  Goal status: updated, 09/28/2023  2. L Knee flexion AROM -5 to 90* Goal status: updated, 09/28/2023  3. Will be able to ascend and descend stairs reciprocally with no increase in pain Goal status: updated, 09/28/2023  4. Will be able to ambulate community distances and perform all household tasks  with no increase in pain w/ LRAD  Goal status: updated, 09/28/2023  5.  Patient will demonstrate Patient specific functional scale >5 to indicate reduced disability due to condition.  Goal status: updated, 09/28/2023  6. L Knee flexion PROM -2 to 100* Goal status: updated, 09/28/2023   PLAN:  PT FREQUENCY:  4-5x/week for 2 weeks then 2-3x/week for 6 weeks  PT DURATION: 8 weeks  PLANNED INTERVENTIONS: 97750- Physical Performance Testing, 97110-Therapeutic exercises, 97530- Therapeutic activity, 97112- Neuromuscular re-education, 97535- Self Care, [id number]- Manual therapy, [id number]- Gait training, and [id number]- Vasopneumatic device  PLAN FOR NEXT  SESSION: Continue with ROM and strengthening.   Burnard Meth, PT 09/29/23  2:49 PM

## 2023-09-30 ENCOUNTER — Encounter: Payer: Self-pay | Admitting: Rehabilitative and Restorative Service Providers"

## 2023-09-30 ENCOUNTER — Ambulatory Visit: Admitting: Rehabilitative and Restorative Service Providers"

## 2023-09-30 DIAGNOSIS — M25662 Stiffness of left knee, not elsewhere classified: Secondary | ICD-10-CM

## 2023-09-30 DIAGNOSIS — R2689 Other abnormalities of gait and mobility: Secondary | ICD-10-CM

## 2023-09-30 DIAGNOSIS — R600 Localized edema: Secondary | ICD-10-CM | POA: Diagnosis not present

## 2023-09-30 DIAGNOSIS — G8929 Other chronic pain: Secondary | ICD-10-CM

## 2023-09-30 DIAGNOSIS — R262 Difficulty in walking, not elsewhere classified: Secondary | ICD-10-CM | POA: Diagnosis not present

## 2023-09-30 DIAGNOSIS — R2681 Unsteadiness on feet: Secondary | ICD-10-CM

## 2023-09-30 DIAGNOSIS — M6281 Muscle weakness (generalized): Secondary | ICD-10-CM

## 2023-09-30 DIAGNOSIS — M25562 Pain in left knee: Secondary | ICD-10-CM

## 2023-09-30 NOTE — Therapy (Addendum)
 OUTPATIENT PHYSICAL THERAPY TREATMENT  Patient Name: Patricia Hoover MRN: [id number] DOB:1945/02/05, 78 y.o., female Today's Date: 09/30/2023     END OF SESSION:  PT End of Session - 09/30/23 0932     Visit Number 19    Number of Visits 30    Date for PT Re-Evaluation 11/11/23    Authorization Type MCR    Authorization Time Period --    Progress Note Due on Visit 27    PT Start Time 0931    PT Stop Time 1026    PT Time Calculation (min) 55 min    Activity Tolerance Patient tolerated treatment well    Behavior During Therapy [facility] for tasks assessed/performed           Past Medical History:  Diagnosis Date   Angioedema 06/15/2017   Arrhythmia    possible hx, resolved prev   Blood transfusion without reported diagnosis 1972   had reaction; had to stop   Carpal tunnel syndrome    Cataract 2019   resolved with surgery   Complication of anesthesia    takes a long to wake up   Duodenal ulcer    H/O exercise stress test 2012   normal   Heart murmur    as child   Hematochezia    High cholesterol    History of kidney stones    Hx of colonic polyps    Hypertension    Osteoporosis 2010   t score - 3.9   Peptic ulcer of duodenum    Renal stones    Vitamin D  deficiency 03/01/2021   Past Surgical History:  Procedure Laterality Date   ABDOMINAL HYSTERECTOMY  1972   for endometriosis   CATARACT EXTRACTION W/ INTRAOCULAR LENS IMPLANT Right 06/2017   CATARACT EXTRACTION W/ INTRAOCULAR LENS IMPLANT Left 07/2017   COLONOSCOPY Left 03/02/2017   Procedure: COLONOSCOPY;  Surgeon: Janalyn Keene NOVAK, MD;  Location: [facility] ENDOSCOPY;  Service: Endoscopy;  Laterality: Left;   COLONOSCOPY WITH PROPOFOL  N/A 05/04/2016   Procedure: COLONOSCOPY WITH PROPOFOL ;  Surgeon: Ruel Kung, MD;  Location: [facility] ENDOSCOPY;  Service: Endoscopy;  Laterality: N/A;   ESOPHAGOGASTRODUODENOSCOPY Left 03/02/2017   Procedure: ESOPHAGOGASTRODUODENOSCOPY (EGD);  Surgeon: Janalyn Keene NOVAK, MD;  Location:  [facility] ENDOSCOPY;  Service: Endoscopy;  Laterality: Left;   ESOPHAGOGASTRODUODENOSCOPY (EGD) WITH PROPOFOL  N/A 04/08/2017   Procedure: ESOPHAGOGASTRODUODENOSCOPY (EGD) WITH PROPOFOL ;  Surgeon: Janalyn Keene NOVAK, MD;  Location: [facility] ENDOSCOPY;  Service: Endoscopy;  Laterality: N/A;   EXAM UNDER ANESTHESIA WITH MANIPULATION OF KNEE Left 09/13/2023   Procedure: MANIPULATION, JOINT, KNEE, WITH ANESTHESIA;  Surgeon: Addie Cordella Hamilton, MD;  Location: [facility] OR;  Service: Orthopedics;  Laterality: Left;   LITHOTRIPSY     for kidney stone x5   LITHOTRIPSY  09/2017   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   Procedure: ARTHROPLASTY, KNEE, TOTAL, LEFT;  Surgeon: Addie Cordella Hamilton, MD;  Location: [facility] OR;  Service: Orthopedics;  Laterality: Left;   Patient Active Problem List   Diagnosis Date Noted   Arthrofibrosis of knee joint, left 09/18/2023   Arthritis of left knee 07/26/2023   Lower GI bleeding 06/29/2023   Hypokalemia 03/09/2023   Aortic atherosclerosis (HCC) 03/09/2023   Unilateral primary osteoarthritis, left knee 11/26/2022   Dysuria 04/21/2022   Stiff-legged gait 04/21/2022   Scalp lesion 09/21/2021   Clavicle enlargement 04/05/2021   Hyperpigmentation 03/01/2021   Vitamin D  deficiency 03/01/2021   Leg pain 05/21/2019   Right leg pain 05/21/2019   GI bleed 03/04/2018  Healthcare maintenance 01/08/2018   Paresthesia 01/08/2018   Neck pain 08/31/2017   Angioedema 06/15/2017   External hemorrhoids    Diverticulosis of large intestine without diverticulitis    Internal hemorrhoids    Advance care planning 12/23/2016   Renal stone 04/20/2016   Heartburn 12/17/2015   Medicare annual wellness visit, subsequent 06/05/2013   Hyperglycemia 06/05/2013   Knee pain 08/31/2012   Age-related osteoporosis without current pathological fracture 08/18/2012   HTN (hypertension) 08/09/2012   HLD (hyperlipidemia) 08/09/2012    PCP: Cleatus Molly MD   REFERRING PROVIDER: Addie Cordella Hamilton,  MD  REFERRING DIAG: Diagnosis [id number] (ICD-10-CM) - Chronic pain of left knee M17.12 (ICD-10-CM) - Unilateral primary osteoarthritis, left knee  THERAPY DIAG:  Stiffness of left knee, not elsewhere classified  Chronic pain of left knee  Difficulty in walking, not elsewhere classified  Muscle weakness (generalized)  Other abnormalities of gait and mobility  Localized edema  Unsteadiness on feet  Rationale for Evaluation and Treatment: Rehabilitation  ONSET DATE: Lt TKR 07/26/23  SUBJECTIVE:   SUBJECTIVE STATEMENT:  Patient reports stiffness at morning and night  PERTINENT HISTORY: Left TKA 07/26/2023, OA, osteoporosis, heart murmur, HTN  PAIN:  NPRS scale: no pain at rest,  5/10 at worst Pain location: Lt knee  Pain description:  stiffness, tightness.  Aggravating factors: bending it  Relieving factors: nothing besides pain pills   PRECAUTIONS: None  RED FLAGS: None   WEIGHT BEARING RESTRICTIONS: No  FALLS:  Has patient fallen in last 6 months? No  LIVING ENVIRONMENT: Lives with: lives with their family Lives in: House/apartment Stairs:  flight inside but does not have to go up  Has following equipment at home: Vannie - 2 wheeled  OCCUPATION: retiredWater engineer at an [employer]   PLOF: Independent, Independent with basic ADLs, Independent with gait, and Independent with transfers  PATIENT GOALS: be able to line dance   NEXT MD VISIT: Referring 09/23/2023  OBJECTIVE:  Note: Objective measures were completed at Evaluation unless otherwise noted.  PATIENT SURVEYS:  PSFS: THE PATIENT SPECIFIC FUNCTIONAL SCALE  Place score of 0-10 (0 = unable to perform activity and 10 = able to perform activity at the same level as before injury or problem)  Activity Date: 08/12/23 09/14/23 09/28/2023    Bending knee  5 1 5   2. Lifting leg  5 1 6   3.walking  2 8  4. Standing for ADLs  2 6  Total Score 5 1.5 6.25    Total Score = Sum of activity scores/number  of activities  Minimally Detectable Change: 3 points (for single activity); 2 points (for average score)   COGNITION: 08/12/2023 Overall cognitive status: Within functional limits for tasks assessed     SENSATION: 08/12/2023 [facility]  EDEMA:  08/12/2023 Localized edema appropriate for post-op state    LOWER EXTREMITY ROM:  Active ROM Left eval Left  08/22/23 Left  08/24/23 Left 09/01/23 Left  09/14/23 Left 09/20/23 Left 09/28/2023  Hip flexion         Hip extension         Hip abduction         Hip adduction         Hip internal rotation         Hip external rotation         Knee flexion 67 64* 73* 62 in supine AROM heel slide  721 in PROM heel slide Supine 90/90 position A: 68* Supine A: 60 P: 65 Sitting:  P: 70 Supine AROM heel slide: 71  Supine PROM: 74  Knee extension -9 -5* -5* -8 in seated AROM LAQ  Supine -9* Supine P: -5 Seated -6 AROM in heel prop  Ankle dorsiflexion         Ankle plantarflexion         Ankle inversion         Ankle eversion          (Blank rows = not tested)  LOWER EXTREMITY MMT:  MMT Left Eval 08/12/2023 Left 09/14/23 Left 09/28/2023  Hip flexion   5/5  Hip extension     Hip abduction     Hip adduction     Hip internal rotation     Hip external rotation     Knee flexion 3+ 3-/5 4+/5  Knee extension 3+ 3-/5 4/5  Ankle dorsiflexion   5/5  Ankle plantarflexion     Ankle inversion     Ankle eversion      (Blank rows = not tested)    GAIT: 09/28/2023: Ambulation with SPC in Rt UE with some quality improvements in knee flexion in swing.  Lacking TKE still noted in stance.   08/30/2023: Ambulation in clinic c SPC in Rt UE with SBA.  Reduced knee extension in stance, maintaining knee flexion.   08/12/2023 Distance walked: in clinic distances  Assistive device utilized: Environmental consultant - 2 wheeled Level of assistance: Modified independence Comments: antalgic, limited knee ROM during gait cycle                  TREATMENT        09/30/23   Therex: Recumbent bike, level 1, seat 2 for 9 minutes Glute bridge MWM  Neuro Re-Ed (for muscle activation) Quad set: opposite leg, Lt LE, both LE, 3x each for 3 sets  Bilateral SAQ, 2x8  Manual Grade 3/4 mobs for knee extension  Contract, relax technique for knee flexion   Vaso:  Lt LE elevated on wedge with medium compression for 10 minutes at 34deg  TREATMENT        09/29/23 Therex: UBE UE/LE for knee ROM to tolerance partial circles mainly with full mixed in around 6 mins in - 8 mins total , seat 5 Supine knee extension with heel prop and 4# weight pulling knee into extension 3 min SLR 20x Seated Lt leg LAQ 3 x 10 for ROM  TherActivity: Mini squats with UE support  Sit to stands  Manual: Passive extension    DATE: 09/28/2023 Therex: UBE UE/LE for knee ROM to tolerance partial circles mainly with full mixed in around 6 mins in - 8 mins total , seat 5 Seated knee extension with heel prop and 4# weight pulling knee into extension 60 seconds x 4 with tailgate flexion for 1 min between.  Glute bridge MWM for flexion gains 2 x 5  Seated Lt leg LAQ 3 x 10 for ROM  TherActivity: Mini squats with UE support   Manual: Supine 90/90 gravity assist knee flexion with overpressure into knee flexion/extension  Vaso:  Held due to MD visit.   TREATMENT       DATE: 09/27/2023 Therex: UBE UE/LE for knee ROM to tolerance partial circles mainly with some full mixed in at times- 8 mins  , seat 5 Incline gastroc stretch 30 sec x 3 bilateral  Seated quad set 5 sec hold x 10 Lt leg Seated quad set with SLR slowly 3 x 5 Lt leg    TherActivity: Leg  press double leg 62 lbs with focus on slow lowering into available flexion x 15 Leg press single leg 37 lbs Lt  x 15 with focus on slow lowering into available flexion  Manual: Supine Lt knee flexion mobilization c movement distraction/IR.  Contract relax for flexion mobility.    Vaso:  Lt LE elevated on wedge with medium compression  for 10 minutes at 34deg   PATIENT EDUCATION:  Education details: exam findings, POC, course of care following TKR  Person educated: Patient Education method: Explanation and Demonstration Education comprehension: verbalized understanding, returned demonstration, and needs further education  HOME EXERCISE PROGRAM: Access Code: [email or web address] URL: [email or web address] Date: 09/22/2023 Prepared by: Ismael Theophilus Stallion Access Code: [email or web address] URL: [email or web address] Date: 09/26/2023 Prepared by: Burnard Meth  Exercises - Supine Heel Slide with Strap  - 5 x daily - 7 x weekly - 1 sets - 10 reps - 10 seconds hold - Supine Knee Extension Stretch on Towel Roll  - 1 x daily - 7 x weekly - 3 sets - 10 reps - Seated Knee Flexion Extension AROM   - 1 x daily - 7 x weekly - 3 sets - 10 reps - Seated Knee Flexion AAROM  - 1 x daily - 7 x weekly - 3 sets - 10 reps - Supine Short Arc Quad  - 1 x daily - 7 x weekly - 3 sets - 10 reps - Standing Terminal Knee Extension with Resistance  - 1 x daily - 7 x weekly - 2-3 sets - 10 reps - 5 seconds hold - Mini Squat with Counter Support  - 2 x daily - 7 x weekly - 2 sets - 10 reps - 5 hold  ASSESSMENT:  CLINICAL IMPRESSION:   Patient continues to have difficulty with knee extension/flexion. Patient is able to activate Lt quadriceps more when activating quadriceps bilaterally. Patient will continue to benefit from skilled PT to address ROM.   OBJECTIVE IMPAIRMENTS: Abnormal gait, decreased activity tolerance, decreased balance, decreased knowledge of use of DME, decreased mobility, difficulty walking, decreased ROM, decreased strength, hypomobility, increased muscle spasms, impaired flexibility, and pain.   ACTIVITY LIMITATIONS: sitting, standing, squatting, sleeping, stairs, transfers, bed mobility, and locomotion level  PARTICIPATION LIMITATIONS: driving, shopping, community activity, yard work, and church  PERSONAL  FACTORS: Age, Behavior pattern, Education, Fitness, Past/current experiences, Social background, and Time since onset of injury/illness/exacerbation are also affecting patient's functional outcome.   REHAB POTENTIAL: Good  CLINICAL DECISION MAKING: Stable/uncomplicated  EVALUATION COMPLEXITY: Low   GOALS: Goals reviewed with patient? No  SHORT TERM GOALS: Target date: 10/07/2023   Will be compliant with appropriate progressive HEP Goal status: updated, 09/14/2023  2. Lt knee ROM 5-85* Goal status: updated, 09/20/2023  3. Will be independent with edema management strategies  Goal status: updated, 09/20/2023   LONG TERM GOALS: Target date: 11/11/2023   Patient will demonstrate Lt knee MMT WFL throughout to faciltiate usual transfers, stairs, squatting at PLOF for daily life.  Goal status: updated, 09/28/2023  2. L Knee flexion AROM -5 to 90* Goal status: updated, 09/28/2023  3. Will be able to ascend and descend stairs reciprocally with no increase in pain Goal status: updated, 09/28/2023  4. Will be able to ambulate community distances and perform all household tasks with no increase in pain w/ LRAD  Goal status: updated, 09/28/2023  5.  Patient will demonstrate Patient specific functional scale >5 to indicate reduced disability due to condition.  Goal status: updated, 09/28/2023  6. L Knee flexion  PROM -2 to 100* Goal status: updated, 09/28/2023   PLAN:  PT FREQUENCY:  4-5x/week for 2 weeks then 2-3x/week for 6 weeks  PT DURATION: 8 weeks  PLANNED INTERVENTIONS: 97750- Physical Performance Testing, 97110-Therapeutic exercises, 97530- Therapeutic activity, 97112- Neuromuscular re-education, 97535- Self Care, 02859- Manual therapy, 97116- Gait training, and 97016- Vasopneumatic device  PLAN FOR NEXT SESSION: Continue with ROM and strengthening.   Ismael Theophilus Stallion, SPT 09/30/23  11:22 AM

## 2023-10-06 ENCOUNTER — Ambulatory Visit: Admitting: Gastroenterology

## 2023-10-06 ENCOUNTER — Encounter: Payer: Self-pay | Admitting: Gastroenterology

## 2023-10-06 VITALS — BP 149/86 | HR 85 | Temp 98.1°F | Resp 13 | Ht <= 58 in | Wt 143.0 lb

## 2023-10-06 DIAGNOSIS — K5730 Diverticulosis of large intestine without perforation or abscess without bleeding: Secondary | ICD-10-CM | POA: Diagnosis not present

## 2023-10-06 DIAGNOSIS — K635 Polyp of colon: Secondary | ICD-10-CM | POA: Diagnosis not present

## 2023-10-06 DIAGNOSIS — K648 Other hemorrhoids: Secondary | ICD-10-CM | POA: Diagnosis not present

## 2023-10-06 DIAGNOSIS — D122 Benign neoplasm of ascending colon: Secondary | ICD-10-CM

## 2023-10-06 DIAGNOSIS — I10 Essential (primary) hypertension: Secondary | ICD-10-CM | POA: Diagnosis not present

## 2023-10-06 DIAGNOSIS — K922 Gastrointestinal hemorrhage, unspecified: Secondary | ICD-10-CM

## 2023-10-06 DIAGNOSIS — K644 Residual hemorrhoidal skin tags: Secondary | ICD-10-CM

## 2023-10-06 DIAGNOSIS — K625 Hemorrhage of anus and rectum: Secondary | ICD-10-CM

## 2023-10-06 DIAGNOSIS — E7800 Pure hypercholesterolemia, unspecified: Secondary | ICD-10-CM | POA: Diagnosis not present

## 2023-10-06 MED ORDER — SODIUM CHLORIDE 0.9 % IV SOLN: 500.0000 mL | INTRAVENOUS | Status: DC

## 2023-10-06 NOTE — Progress Notes (Signed)
 [facility] History and Physical   Primary Care Physician:  Cleatus Arlyss RAMAN, MD   Reason for Procedure:  Lower GI bleed, rectal bleeding  Plan:    colonoscopy with possible interventions as needed     HPI: Patricia Hoover is a very pleasant 78 y.o. female here for colonoscopy for evaluation of lower GI bleed .   The risks and benefits as well as alternatives of endoscopic procedure(s) have been discussed and reviewed. All questions answered. The patient agrees to proceed.    Past Medical History:  Diagnosis Date   Angioedema 06/15/2017   Arrhythmia    possible hx, resolved prev   Blood transfusion without reported diagnosis 1972   had reaction; had to stop   Carpal tunnel syndrome    Cataract 2019   resolved with surgery   Complication of anesthesia    takes a long to wake up   Duodenal ulcer    H/O exercise stress test 2012   normal   Heart murmur    as child   Hematochezia    High cholesterol    History of kidney stones    Hx of colonic polyps    Hypertension    Osteoporosis 2010   t score - 3.9   Peptic ulcer of duodenum    Renal stones    Vitamin D  deficiency 03/01/2021    Past Surgical History:  Procedure Laterality Date   ABDOMINAL HYSTERECTOMY  1972   for endometriosis   CATARACT EXTRACTION W/ INTRAOCULAR LENS IMPLANT Right 06/2017   CATARACT EXTRACTION W/ INTRAOCULAR LENS IMPLANT Left 07/2017   COLONOSCOPY Left 03/02/2017   Procedure: COLONOSCOPY;  Surgeon: Janalyn Keene NOVAK, MD;  Location: [facility] ENDOSCOPY;  Service: Endoscopy;  Laterality: Left;   COLONOSCOPY WITH PROPOFOL  N/A 05/04/2016   Procedure: COLONOSCOPY WITH PROPOFOL ;  Surgeon: Ruel Kung, MD;  Location: [facility] ENDOSCOPY;  Service: Endoscopy;  Laterality: N/A;   ESOPHAGOGASTRODUODENOSCOPY Left 03/02/2017   Procedure: ESOPHAGOGASTRODUODENOSCOPY (EGD);  Surgeon: Janalyn Keene NOVAK, MD;  Location: [facility] ENDOSCOPY;  Service: Endoscopy;  Laterality: Left;   ESOPHAGOGASTRODUODENOSCOPY  (EGD) WITH PROPOFOL  N/A 04/08/2017   Procedure: ESOPHAGOGASTRODUODENOSCOPY (EGD) WITH PROPOFOL ;  Surgeon: Janalyn Keene NOVAK, MD;  Location: [facility] ENDOSCOPY;  Service: Endoscopy;  Laterality: N/A;   EXAM UNDER ANESTHESIA WITH MANIPULATION OF KNEE Left 09/13/2023   Procedure: MANIPULATION, JOINT, KNEE, WITH ANESTHESIA;  Surgeon: Addie Cordella Hamilton, MD;  Location: [facility] OR;  Service: Orthopedics;  Laterality: Left;   LITHOTRIPSY     for kidney stone x5   LITHOTRIPSY  09/2017   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   Procedure: ARTHROPLASTY, KNEE, TOTAL, LEFT;  Surgeon: Addie Cordella Hamilton, MD;  Location: [facility] OR;  Service: Orthopedics;  Laterality: Left;    Prior to Admission medications   Medication Sig Start Date End Date Taking? Authorizing Provider  amLODipine  (NORVASC ) 2.5 MG tablet Take 1 tablet (2.5 mg total) by mouth daily. 03/04/23  Yes Cleatus Arlyss RAMAN, MD  Ascorbic Acid (VITAMIN C GUMMIE PO) Take 2 each by mouth in the morning.   Yes [provider]  aspirin  (ASPIRIN  CHILDRENS) 81 MG chewable tablet Chew 1 tablet (81 mg total) by mouth daily. To prevent blood clots 09/13/23 10/13/23 Yes Magnant, Charles L, PA-C  Cholecalciferol  (VITAMIN D3 GUMMIES PO) Take 2 each by mouth in the morning.   Yes [provider]  indapamide  (LOZOL ) 2.5 MG tablet Take 1 tablet (2.5 mg total) by mouth daily. 03/04/23  Yes Cleatus Arlyss RAMAN, MD  polyethylene glycol powder (  GLYCOLAX/MIRALAX) 17 GM/SCOOP powder Take 17 g by mouth daily as needed for mild constipation.   Yes [provider]  Potassium Citrate  15 MEQ (1620 MG) TBCR Take 1 tablet by mouth in the morning and at bedtime. 07/13/23  Yes Cleatus Arlyss RAMAN, MD  simvastatin  (ZOCOR ) 10 MG tablet Take 1 tablet (10 mg total) by mouth every other day. 03/04/23  Yes Cleatus Arlyss RAMAN, MD  docusate sodium  (COLACE) 100 MG capsule Take 100 mg by mouth 2 (two) times daily as needed (constipation.).    [provider]  methocarbamol   (ROBAXIN ) 500 MG tablet Take 1 tablet (500 mg total) by mouth every 8 (eight) hours as needed for muscle spasms. Patient not taking: Reported on 10/06/2023 09/13/23   Magnant, Carlin CROME, PA-C  oxyCODONE  (ROXICODONE ) 5 MG immediate release tablet Take 1 tablet (5 mg total) by mouth every 6 (six) hours as needed for severe pain (pain score 7-10). Patient not taking: Reported on 10/06/2023 09/13/23   Shirly Carlin CROME, PA-C    Current Outpatient Medications  Medication Sig Dispense Refill   amLODipine  (NORVASC ) 2.5 MG tablet Take 1 tablet (2.5 mg total) by mouth daily. 90 tablet 3   Ascorbic Acid (VITAMIN C GUMMIE PO) Take 2 each by mouth in the morning.     aspirin  (ASPIRIN  CHILDRENS) 81 MG chewable tablet Chew 1 tablet (81 mg total) by mouth daily. To prevent blood clots 14 tablet 0   Cholecalciferol  (VITAMIN D3 GUMMIES PO) Take 2 each by mouth in the morning.     indapamide  (LOZOL ) 2.5 MG tablet Take 1 tablet (2.5 mg total) by mouth daily. 90 tablet 3   polyethylene glycol powder (GLYCOLAX/MIRALAX) 17 GM/SCOOP powder Take 17 g by mouth daily as needed for mild constipation.     Potassium Citrate  15 MEQ (1620 MG) TBCR Take 1 tablet by mouth in the morning and at bedtime.     simvastatin  (ZOCOR ) 10 MG tablet Take 1 tablet (10 mg total) by mouth every other day. 45 tablet 3   docusate sodium  (COLACE) 100 MG capsule Take 100 mg by mouth 2 (two) times daily as needed (constipation.).     methocarbamol  (ROBAXIN ) 500 MG tablet Take 1 tablet (500 mg total) by mouth every 8 (eight) hours as needed for muscle spasms. (Patient not taking: Reported on 10/06/2023) 30 tablet 1   oxyCODONE  (ROXICODONE ) 5 MG immediate release tablet Take 1 tablet (5 mg total) by mouth every 6 (six) hours as needed for severe pain (pain score 7-10). (Patient not taking: Reported on 10/06/2023) 30 tablet 0   Current Facility-Administered Medications  Medication Dose Route Frequency Provider Last Rate Last Admin   0.9 %  sodium  chloride infusion  500 mL Intravenous Continuous Rori Goar V, MD        Allergies as of 10/06/2023 - Review Complete 10/06/2023  Allergen Reaction Noted   Ace inhibitors Swelling 06/13/2017   Angiotensin receptor blockers Other (See Comments) 06/14/2017   Aspirin  Other (See Comments) 03/15/2017   Bisphosphonates Other (See Comments) 08/18/2012   Nsaids Other (See Comments) 03/15/2017   Prolia [denosumab] Other (See Comments) 06/21/2013    Family History  Problem Relation Age of Onset   Heart disease Mother    Renal Disease Mother    Heart failure Mother    Colon cancer Neg Hx    Breast cancer Neg Hx     Social History   Socioeconomic History   Marital status: Widowed    Spouse name: Not  on file   Number of children: Not on file   Years of education: Not on file   Highest education level: Not on file  Occupational History   Occupation: Retired    Comment: Product manager  Tobacco Use   Smoking status: Former    Current packs/day: 0.00    Types: Cigarettes    Quit date: 01/19/1991    Years since quitting: 32.7   Smokeless tobacco: Never  Vaping Use   Vaping status: Never Used  Substance and Sexual Activity   Alcohol use: No    Comment: rare wine   Drug use: No   Sexual activity: Not on file  Other Topics Concern   Not on file  Social History Narrative   Education:  1 year Business School   Retired back to [employer] after working in [employer]   Widowed '93   2 kids   Social Drivers of Corporate investment banker Strain: Low Risk  (03/15/2023)   Overall Financial Resource Strain (CARDIA)    Difficulty of Paying Living Expenses: Not hard at all  Food Insecurity: No Food Insecurity (06/30/2023)   Hunger Vital Sign    Worried About Running Out of Food in the Last Year: Never true    Ran Out of Food in the Last Year: Never true  Transportation Needs: No Transportation Needs (06/30/2023)   PRAPARE - Scientist, research (physical sciences) (Medical): No    Lack of Transportation (Non-Medical): No  Physical Activity: Insufficiently Active (03/15/2023)   Exercise Vital Sign    Days of Exercise per Week: 2 days    Minutes of Exercise per Session: 30 min  Stress: Stress Concern Present (03/15/2023)   [employer] of Occupational Health - Occupational Stress Questionnaire    Feeling of Stress : To some extent  Social Connections: Moderately Integrated (06/30/2023)   Social Connection and Isolation Panel    Frequency of Communication with Friends and Family: Twice a week    Frequency of Social Gatherings with Friends and Family: Twice a week    Attends Religious Services: More than 4 times per year    Active Member of [employer] or Organizations: Yes    Attends Banker Meetings: 1 to 4 times per year    Marital Status: Widowed  Intimate Partner Violence: Not At Risk (06/30/2023)   Humiliation, Afraid, Rape, and Kick questionnaire    Fear of Current or Ex-Partner: No    Emotionally Abused: No    Physically Abused: No    Sexually Abused: No    Review of Systems:  All other review of systems negative except as mentioned in the HPI.  Physical Exam: Vital signs in last 24 hours: BP 132/66   Pulse 95   Temp 98.1 F (36.7 C)   Ht 4' 8 (1.422 m)   Wt 143 lb (64.9 kg)   SpO2 96%   BMI 32.06 kg/m  General:   Alert, NAD Lungs:  Clear .   Heart:  Regular rate and rhythm Abdomen:  Soft, nontender and nondistended. Neuro/Psych:  Alert and cooperative. Normal mood and affect. A and O x 3  Reviewed labs, radiology imaging, old records and pertinent past GI work up  Patient is appropriate for planned procedure(s) and anesthesia in an ambulatory setting   K. Veena Ronin Crager , MD [phone]

## 2023-10-06 NOTE — Progress Notes (Signed)
 Called to room to assist during endoscopic procedure.  Patient ID and intended procedure confirmed with present staff. Received instructions for my participation in the procedure from the performing physician.

## 2023-10-06 NOTE — Op Note (Signed)
 [facility] Patient Name: Patricia Hoover Procedure Date: 10/06/2023 12:07 PM MRN: [id number] Endoscopist: Gustav ALONSO Mcgee , MD, [id number] Age: 78 Referring MD:  Date of Birth: 1945-12-28 Gender: Female Account #: [id number] Procedure:                Colonoscopy Indications:              Evaluation of unexplained GI bleeding presenting                            with Hematochezia Medicines:                Monitored Anesthesia Care Procedure:                Pre-Anesthesia Assessment:                           - Prior to the procedure, a History and Physical                            was performed, and patient medications and                            allergies were reviewed. The patient's tolerance of                            previous anesthesia was also reviewed. The risks                            and benefits of the procedure and the sedation                            options and risks were discussed with the patient.                            All questions were answered, and informed consent                            was obtained. Prior Anticoagulants: The patient has                            taken no anticoagulant or antiplatelet agents. ASA                            Grade Assessment: II - A patient with mild systemic                            disease. After reviewing the risks and benefits,                            the patient was deemed in satisfactory condition to                            undergo the procedure.  After obtaining informed consent, the colonoscope                            was passed under direct vision. Throughout the                            procedure, the patient's blood pressure, pulse, and                            oxygen saturations were monitored continuously. The                            Olympus Scope SN: [id number] was introduced through                            the anus and advanced to the the  cecum, identified                            by appendiceal orifice and ileocecal valve. The                            colonoscopy was performed without difficulty. The                            patient tolerated the procedure well. The quality                            of the bowel preparation was adequate. The                            ileocecal valve, appendiceal orifice, and rectum                            were photographed. Scope In: 1:29:04 PM Scope Out: 1:44:44 PM Scope Withdrawal Time: 0 hours 9 minutes 4 seconds  Total Procedure Duration: 0 hours 15 minutes 40 seconds  Findings:                 The perianal and digital rectal examinations were                            normal.                           A 4 mm polyp was found in the ascending colon. The                            polyp was sessile. The polyp was removed with a                            cold snare. Resection and retrieval were complete.                           Scattered large-mouthed, medium-mouthed and  small-mouthed diverticula were found in the sigmoid                            colon, descending colon, transverse colon,                            ascending colon and cecum.                           Non-bleeding external and internal hemorrhoids were                            found during retroflexion. The hemorrhoids were                            medium-sized. Complications:            No immediate complications. Estimated Blood Loss:     Estimated blood loss was minimal. Impression:               - One 4 mm polyp in the ascending colon, removed                            with a cold snare. Resected and retrieved.                           - Moderate diverticulosis in the sigmoid colon, in                            the descending colon, in the transverse colon, in                            the ascending colon and in the cecum.                           - Non-bleeding  external and internal hemorrhoids. Recommendation:           - Patient has a contact number available for                            emergencies. The signs and symptoms of potential                            delayed complications were discussed with the                            patient. Return to normal activities tomorrow.                            Written discharge instructions were provided to the                            patient.                           - Resume previous diet.                           -  Continue present medications.                           - Await pathology results.                           - No repeat colonoscopy due to age. Laquanta Hummel V. Ryelan Kazee, MD 10/06/2023 1:53:28 PM This report has been signed electronically.

## 2023-10-06 NOTE — Patient Instructions (Signed)
 YOU HAD AN ENDOSCOPIC PROCEDURE TODAY AT THE [facility]:   Refer to the procedure report that was given to you for any specific questions about what was found during the examination.  If the procedure report does not answer your questions, please call your gastroenterologist to clarify.  If you requested that your care partner not be given the details of your procedure findings, then the procedure report has been included in a sealed envelope for you to review at your convenience later.  YOU SHOULD EXPECT: Some feelings of bloating in the abdomen. Passage of more gas than usual.  Walking can help get rid of the air that was put into your GI tract during the procedure and reduce the bloating. If you had a lower endoscopy (such as a colonoscopy or flexible sigmoidoscopy) you may notice spotting of blood in your stool or on the toilet paper. If you underwent a bowel prep for your procedure, you may not have a normal bowel movement for a few days.  Please Note:  You might notice some irritation and congestion in your nose or some drainage.  This is from the oxygen used during your procedure.  There is no need for concern and it should clear up in a day or so.  SYMPTOMS TO REPORT IMMEDIATELY:  Following lower endoscopy (colonoscopy or flexible sigmoidoscopy):  Excessive amounts of blood in the stool  Significant tenderness or worsening of abdominal pains  Swelling of the abdomen that is new, acute  Fever of 100F or higher  Resume previous diet Continue present medications Await pathology results No repeat colonoscopy due to age   For urgent or emergent issues, a gastroenterologist can be reached at any hour by calling [phone]. Do not use MyChart messaging for urgent concerns.    DIET:  We do recommend a small meal at first, but then you may proceed to your regular diet.  Drink plenty of fluids but you should avoid alcoholic beverages for 24 hours.  ACTIVITY:  You should  plan to take it easy for the rest of today and you should NOT DRIVE or use heavy machinery until tomorrow (because of the sedation medicines used during the test).    FOLLOW UP: Our staff will call the number listed on your records the next business day following your procedure.  We will call around 7:15- 8:00 am to check on you and address any questions or concerns that you may have regarding the information given to you following your procedure. If we do not reach you, we will leave a message.     If any biopsies were taken you will be contacted by phone or by letter within the next 1-3 weeks.  Please call us  at [phone] if you have not heard about the biopsies in 3 weeks.    SIGNATURES/CONFIDENTIALITY: You and/or your care partner have signed paperwork which will be entered into your electronic medical record.  These signatures attest to the fact that that the information above on your After Visit Summary has been reviewed and is understood.  Full responsibility of the confidentiality of this discharge information lies with you and/or your care-partner.

## 2023-10-06 NOTE — Progress Notes (Signed)
 Vss nad trans to pacu

## 2023-10-07 ENCOUNTER — Telehealth: Payer: Self-pay

## 2023-10-07 NOTE — Telephone Encounter (Signed)
  Follow up Call-     10/06/2023   12:36 PM  Call back number  Post procedure Call Back phone  # [phone]  Permission to leave phone message Yes     Patient questions:  Do you have a fever, pain , or abdominal swelling? No. Pain Score  0 *  Have you tolerated food without any problems? Yes.    Have you been able to return to your normal activities? Yes.    Do you have any questions about your discharge instructions: Diet   No. Medications  No. Follow up visit  No.  Do you have questions or concerns about your Care? No.  Actions: * If pain score is 4 or above: No action needed, pain <4.

## 2023-10-11 ENCOUNTER — Encounter: Payer: Self-pay | Admitting: Family Medicine

## 2023-10-11 ENCOUNTER — Ambulatory Visit (INDEPENDENT_AMBULATORY_CARE_PROVIDER_SITE_OTHER): Admitting: Family Medicine

## 2023-10-11 VITALS — BP 144/82 | HR 90 | Temp 99.0°F | Ht <= 58 in | Wt 141.4 lb

## 2023-10-11 DIAGNOSIS — N390 Urinary tract infection, site not specified: Secondary | ICD-10-CM | POA: Diagnosis not present

## 2023-10-11 DIAGNOSIS — M25569 Pain in unspecified knee: Secondary | ICD-10-CM | POA: Diagnosis not present

## 2023-10-11 DIAGNOSIS — G8929 Other chronic pain: Secondary | ICD-10-CM

## 2023-10-11 DIAGNOSIS — R300 Dysuria: Secondary | ICD-10-CM | POA: Diagnosis not present

## 2023-10-11 DIAGNOSIS — M542 Cervicalgia: Secondary | ICD-10-CM

## 2023-10-11 LAB — POC URINALSYSI DIPSTICK (AUTOMATED)
Bilirubin, UA: NEGATIVE
Blood, UA: NEGATIVE
Glucose, UA: NEGATIVE
Ketones, UA: POSITIVE
Leukocytes, UA: NEGATIVE
Nitrite, UA: NEGATIVE
Protein, UA: POSITIVE — AB
Spec Grav, UA: 1.02 (ref 1.010–1.025)
Urobilinogen, UA: NEGATIVE U/dL — AB
pH, UA: 6 (ref 5.0–8.0)

## 2023-10-11 LAB — SURGICAL PATHOLOGY

## 2023-10-11 NOTE — Progress Notes (Unsigned)
 L knee is still puffy with prev ortho eval d/w pt. she is going to restart PT.    dysuria: some urgency at baseline.  duration of symptoms: going on for weeks.   abdominal pain: no fevers: no back pain:no vomiting: with prep for colonoscopy, that appears to be a separate issue and is resolved.   She had some R neck pain but that is better in the meantime.   She had colonoscopy in the meantime.   Meds, vitals, and allergies reviewed.   Per HPI unless specifically indicated in ROS section   GEN: nad, alert and oriented HEENT: mucous membranes moist NECK: supple CV: rrr.  PULM: ctab, no inc wob ABD: soft, +bs, suprapubic area not tender EXT: L knee puffy but not red SKIN: no acute rash BACK: no CVA pain R upper/lateral trap mildly ttp.

## 2023-10-11 NOTE — Patient Instructions (Signed)
 We'll culture your urine.  Try heating pad on your upper back.  Don't get burned.  Keep working on the knee exercises.  Take care.  Glad to see you.

## 2023-10-12 ENCOUNTER — Ambulatory Visit: Payer: Self-pay | Admitting: Family Medicine

## 2023-10-12 LAB — URINE CULTURE
MICRO NUMBER:: 17005056
Result:: NO GROWTH
SPECIMEN QUALITY:: ADEQUATE

## 2023-10-12 NOTE — Assessment & Plan Note (Signed)
 Discussed continuing with PT and Ortho follow-up.

## 2023-10-12 NOTE — Assessment & Plan Note (Signed)
 Possible UTI.  Urinalysis discussed with patient.  Urine culture pending.  See notes on labs.

## 2023-10-12 NOTE — Assessment & Plan Note (Signed)
 This looks like benign muscle pain.  Discussed using heating pad and updating me as needed.

## 2023-10-18 ENCOUNTER — Encounter: Payer: Self-pay | Admitting: Rehabilitative and Restorative Service Providers"

## 2023-10-18 ENCOUNTER — Ambulatory Visit: Admitting: Rehabilitative and Restorative Service Providers"

## 2023-10-18 DIAGNOSIS — R262 Difficulty in walking, not elsewhere classified: Secondary | ICD-10-CM | POA: Diagnosis not present

## 2023-10-18 DIAGNOSIS — G8929 Other chronic pain: Secondary | ICD-10-CM

## 2023-10-18 DIAGNOSIS — R2681 Unsteadiness on feet: Secondary | ICD-10-CM

## 2023-10-18 DIAGNOSIS — R600 Localized edema: Secondary | ICD-10-CM | POA: Diagnosis not present

## 2023-10-18 DIAGNOSIS — M25562 Pain in left knee: Secondary | ICD-10-CM

## 2023-10-18 DIAGNOSIS — M6281 Muscle weakness (generalized): Secondary | ICD-10-CM

## 2023-10-18 DIAGNOSIS — R2689 Other abnormalities of gait and mobility: Secondary | ICD-10-CM | POA: Diagnosis not present

## 2023-10-18 DIAGNOSIS — M25662 Stiffness of left knee, not elsewhere classified: Secondary | ICD-10-CM | POA: Diagnosis not present

## 2023-10-18 NOTE — Therapy (Signed)
 OUTPATIENT PHYSICAL THERAPY TREATMENT  Patient Name: Patricia Hoover MRN: [id number] DOB:Sep 19, 1945, 78 y.o., female Today's Date: 10/18/2023     END OF SESSION:  PT End of Session - 10/18/23 1012     Visit Number 20    Number of Visits 30    Date for Recertification  11/11/23    Authorization Type MCR    Progress Note Due on Visit 27    PT Start Time 1013    PT Stop Time 1053    PT Time Calculation (min) 40 min    Activity Tolerance Patient tolerated treatment well    Behavior During Therapy WFL for tasks assessed/performed            Past Medical History:  Diagnosis Date   Angioedema 06/15/2017   Arrhythmia    possible hx, resolved prev   Blood transfusion without reported diagnosis 1972   had reaction; had to stop   Carpal tunnel syndrome    Cataract 2019   resolved with surgery   Complication of anesthesia    takes a long to wake up   Duodenal ulcer    H/O exercise stress test 2012   normal   Heart murmur    as child   Hematochezia    High cholesterol    History of kidney stones    Hx of colonic polyps    Hypertension    Osteoporosis 2010   t score - 3.9   Peptic ulcer of duodenum    Renal stones    Vitamin D  deficiency 03/01/2021   Past Surgical History:  Procedure Laterality Date   ABDOMINAL HYSTERECTOMY  1972   for endometriosis   CATARACT EXTRACTION W/ INTRAOCULAR LENS IMPLANT Right 06/2017   CATARACT EXTRACTION W/ INTRAOCULAR LENS IMPLANT Left 07/2017   COLONOSCOPY Left 03/02/2017   Procedure: COLONOSCOPY;  Surgeon: Janalyn Keene NOVAK, MD;  Location: [facility] ENDOSCOPY;  Service: Endoscopy;  Laterality: Left;   COLONOSCOPY WITH PROPOFOL  N/A 05/04/2016   Procedure: COLONOSCOPY WITH PROPOFOL ;  Surgeon: Ruel Kung, MD;  Location: [facility] END[facility]COPY;  Service: Endoscopy;  Laterality: N/A;   ESOPHAGOGASTRODUODENOSCOPY Left 03/02/2017   Procedure: ESOPHAGOGASTRODUODENOSCOPY (EGD);  Surgeon: Janalyn Keene NOVAK, MD;  Location: [facility] ENDOSCOPY;  Service:  Endoscopy;  Laterality: Left;   ESOPHAGOGASTRODUODENOSCOPY (EGD) WITH PROPOFOL  N/A 04/08/2017   Procedure: ESOPHAGOGASTRODUODENOSCOPY (EGD) WITH PROPOFOL ;  Surgeon: Janalyn Keene NOVAK, MD;  Location: [facility] END[facility]COPY;  Service: Endoscopy;  Laterality: N/A;   EXAM UNDER ANESTHESIA WITH MANIPULATION OF KNEE Left 09/13/2023   Procedure: MANIPULATION, JOINT, KNEE, WITH ANESTHESIA;  Surgeon: Addie Cordella Hamilton, MD;  Location: [facility] OR;  Service: Orthopedics;  Laterality: Left;   LITHOTRIPSY     for kidney stone x5   LITHOTRIPSY  09/2017   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   Procedure: ARTHROPLASTY, KNEE, TOTAL, LEFT;  Surgeon: Addie Cordella Hamilton, MD;  Location: [facility] OR;  Service: Orthopedics;  Laterality: Left;   Patient Active Problem List   Diagnosis Date Noted   Arthrofibrosis of knee joint, left 09/18/2023   Arthritis of left knee 07/26/2023   Lower GI bleeding 06/29/2023   Aortic atherosclerosis 03/09/2023   Unilateral primary osteoarthritis, left knee 11/26/2022   Dysuria 04/21/2022   Stiff-legged gait 04/21/2022   Scalp lesion 09/21/2021   Clavicle enlargement 04/05/2021   Hyperpigmentation 03/01/2021   Vitamin D  deficiency 03/01/2021   Leg pain 05/21/2019   Right leg pain 05/21/2019   GI bleed 03/04/2018   Healthcare maintenance 01/08/2018   Paresthesia 01/08/2018   Neck pain  08/31/2017   Angioedema 06/15/2017   External hemorrhoids    Diverticulosis of large intestine without diverticulitis    Internal hemorrhoids    Advance care planning 12/23/2016   Renal stone 04/20/2016   Heartburn 12/17/2015   Medicare annual wellness visit, subsequent 06/05/2013   Hyperglycemia 06/05/2013   Knee pain 08/31/2012   Age-related osteoporosis without current pathological fracture 08/18/2012   HTN (hypertension) 08/09/2012   HLD (hyperlipidemia) 08/09/2012    PCP: Cleatus Molly MD   REFERRING PROVIDER: Addie Cordella Hamilton, MD  REFERRING DIAG: Diagnosis [id number] (ICD-10-CM) -  Chronic pain of left knee M17.12 (ICD-10-CM) - Unilateral primary osteoarthritis, left knee  THERAPY DIAG:  Stiffness of left knee, not elsewhere classified  Chronic pain of left knee  Difficulty in walking, not elsewhere classified  Muscle weakness (generalized)  Other abnormalities of gait and mobility  Localized edema  Unsteadiness on feet  Rationale for Evaluation and Treatment: Rehabilitation  ONSET DATE: Lt TKR 07/26/23  SUBJECTIVE:   SUBJECTIVE STATEMENT:   Pt indicated the same complaints of tightness in morning.  Reported not much pain except for end range.   Pt indicated she was just not able to get appointment scheduled in time since last visit.   PERTINENT HISTORY: Left TKA 07/26/2023, OA, osteoporosis, heart murmur, HTN  PAIN:  NPRS scale: no pain with normal life stuff.   Pain location: Lt knee  Pain description:  stiffness, tightness.  Aggravating factors: bending it  Relieving factors: nothing besides pain pills   PRECAUTIONS: None  RED FLAGS: None   WEIGHT BEARING RESTRICTIONS: No  FALLS:  Has patient fallen in last 6 months? No  LIVING ENVIRONMENT: Lives with: lives with their family Lives in: House/apartment Stairs:  flight inside but does not have to go up  Has following equipment at home: Vannie - 2 wheeled  OCCUPATION: retiredWater engineer at an [employer]   PLOF: Independent, Independent with basic ADLs, Independent with gait, and Independent with transfers  PATIENT GOALS: be able to line dance   NEXT MD VISIT: Referring 09/23/2023  OBJECTIVE:  Note: Objective measures were completed at Evaluation unless otherwise noted.  PATIENT SURVEYS:  PSFS: THE PATIENT SPECIFIC FUNCTIONAL SCALE  Place score of 0-10 (0 = unable to perform activity and 10 = able to perform activity at the same level as before injury or problem)  Activity Date: 08/12/23 09/14/23 09/28/2023    Bending knee  5 1 5   2. Lifting leg  5 1 6   3.walking  2 8  4.  Standing for ADLs  2 6  Total Score 5 1.5 6.25    Total Score = Sum of activity scores/number of activities  Minimally Detectable Change: 3 points (for single activity); 2 points (for average score)   COGNITION: 08/12/2023 Overall cognitive status: Within functional limits for tasks assessed     SENSATION: 08/12/2023 [facility]  EDEMA:  08/12/2023 Localized edema appropriate for post-op state    LOWER EXTREMITY ROM:  Active ROM Left eval Left  08/22/23 Left  08/24/23 Left 09/01/23 Left  09/14/23 Left 09/20/23 Left 09/28/2023 Left 10/18/2023  Hip flexion          Hip extension          Hip abduction          Hip adduction          Hip internal rotation          Hip external rotation          Knee  flexion 67 64* 73* 62 in supine AROM heel slide  72 in PROM heel slide Supine 90/90 position A: 68* Supine A: 60 P: 65 Sitting:  P: 70 Supine AROM heel slide: 71  Supine PROM: 74 Supine AROM heel slide: 82  Supine passive range heel slide: 86    Knee extension -9 -5* -5* -8 in seated AROM LAQ  Supine -9* Supine P: -5 Seated -6 AROM in heel prop   Ankle dorsiflexion          Ankle plantarflexion          Ankle inversion          Ankle eversion           (Blank rows = not tested)  LOWER EXTREMITY MMT:  MMT Left Eval 08/12/2023 Left 09/14/23 Left 09/28/2023 Right 10/18/2023 Left 10/18/2023  Hip flexion   5/5    Hip extension       Hip abduction       Hip adduction       Hip internal rotation       Hip external rotation       Knee flexion 3+ 3-/5 4+/5    Knee extension 3+ 3-/5 4/5 5/5 37, 37.3 lbs 4/5 22, 20.6 lbs  Ankle dorsiflexion   5/5    Ankle plantarflexion       Ankle inversion       Ankle eversion        (Blank rows = not tested)    GAIT: 09/28/2023: Ambulation with SPC in Rt UE with some quality improvements in knee flexion in swing.  Lacking TKE still noted in stance.   08/30/2023: Ambulation in clinic c SPC in Rt UE with SBA.  Reduced knee  extension in stance, maintaining knee flexion.   08/12/2023 Distance walked: in clinic distances  Assistive device utilized: Environmental consultant - 2 wheeled Level of assistance: Modified independence Comments: antalgic, limited knee ROM during gait cycle                  TREATMENT       DATE: 10/18/2023 Therex: UBE UE/LE for knee ROM seat 5 partial circles 5 second stretching to full revolutions 10 mins  (5 mins of each) Seated Lt knee LAQ 7 lbs 2 x 15 , performed bilaterally  Seated isometric Lt leg extension 5 sec hold x 10 with contralateral leg pushing back( cues for home use) Supine Lt knee flexion/extension end range stretching 5 sec holds x 10 in 90 deg hip flexion Supine bridge 2 x 5 with heel slide walk up after each set for flexion gains.  Incline gastroc stretch 30 sec x 3 bilateral   Manual Seated Lt knee flexion c distraction/IR mobilization c movement.  Contract relax for extension and flexion gains.    TREATMENT       DATE: 09/30/23  Therex: Recumbent bike, level 1, seat 2 for 9 minutes Glute bridge MWM  Neuro Re-Ed (for muscle activation) Quad set: opposite leg, Lt LE, both LE, 3x each for 3 sets  Bilateral SAQ, 2x8  Manual Grade 3/4 mobs for knee extension  Contract, relax technique for knee flexion   Vaso:  Lt LE elevated on wedge with medium compression for 10 minutes at 34deg  TREATMENT       DATE: 09/29/23 Therex: UBE UE/LE for knee ROM to tolerance partial circles mainly with full mixed in around 6 mins in - 8 mins total , seat 5 Supine knee extension with heel  prop and 4# weight pulling knee into extension 3 min SLR 20x Seated Lt leg LAQ 3 x 10 for ROM  TherActivity: Mini squats with UE support  Sit to stands  Manual: Passive extension  PATIENT EDUCATION:  Education details: exam findings, POC, course of care following TKR  Person educated: Patient Education method: Explanation and Demonstration Education comprehension: verbalized understanding, returned  demonstration, and needs further education  HOME EXERCISE PROGRAM: Access Code: [email or web address] URL: [email or web address] Date: 09/22/2023 Prepared by: Ismael Theophilus Stallion Access Code: [email or web address] URL: [email or web address] Date: 09/26/2023 Prepared by: Burnard Meth  Exercises - Supine Heel Slide with Strap  - 5 x daily - 7 x weekly - 1 sets - 10 reps - 10 seconds hold - Supine Knee Extension Stretch on Towel Roll  - 1 x daily - 7 x weekly - 3 sets - 10 reps - Seated Knee Flexion Extension AROM   - 1 x daily - 7 x weekly - 3 sets - 10 reps - Seated Knee Flexion AAROM  - 1 x daily - 7 x weekly - 3 sets - 10 reps - Supine Short Arc Quad  - 1 x daily - 7 x weekly - 3 sets - 10 reps - Standing Terminal Knee Extension with Resistance  - 1 x daily - 7 x weekly - 2-3 sets - 10 reps - 5 seconds hold - Mini Squat with Counter Support  - 2 x daily - 7 x weekly - 2 sets - 10 reps - 5 hold  ASSESSMENT:  CLINICAL IMPRESSION:    Reassessment of strength showed both legs lacking compared to normal but Lt lagging.  Importance of improving strength and mobility to help improve patient's functional movements on transfers, ambulation and stairs.   Flexion mobility was better than last assessment 20 days ago but still lacking as well as full extension too. Medical necessity for continued skilled PT services to address these impairments.   OBJECTIVE IMPAIRMENTS: Abnormal gait, decreased activity tolerance, decreased balance, decreased knowledge of use of DME, decreased mobility, difficulty walking, decreased ROM, decreased strength, hypomobility, increased muscle spasms, impaired flexibility, and pain.   ACTIVITY LIMITATIONS: sitting, standing, squatting, sleeping, stairs, transfers, bed mobility, and locomotion level  PARTICIPATION LIMITATIONS: driving, shopping, community activity, yard work, and church  PERSONAL FACTORS: Age, Behavior pattern, Education, Fitness, Past/current  experiences, Social background, and Time since onset of injury/illness/exacerbation are also affecting patient's functional outcome.   REHAB POTENTIAL: Good  CLINICAL DECISION MAKING: Stable/uncomplicated  EVALUATION COMPLEXITY: Low   GOALS: Goals reviewed with patient? No  SHORT TERM GOALS: Target date: 10/07/2023   Will be compliant with appropriate progressive HEP Goal status: Met  2. Lt knee ROM 5-85* Goal status: partially met  3. Will be independent with edema management strategies  Goal status: mostly met   LONG TERM GOALS: Target date: 11/11/2023   Patient will demonstrate Lt knee MMT WFL throughout to faciltiate usual transfers, stairs, squatting at PLOF for daily life.  Goal status: updated, 09/28/2023  2. L Knee flexion AROM -5 to 90* Goal status: updated, 09/28/2023  3. Will be able to ascend and descend stairs reciprocally with no increase in pain Goal status: updated, 09/28/2023  4. Will be able to ambulate community distances and perform all household tasks with no increase in pain w/ LRAD  Goal status: updated, 09/28/2023  5.  Patient will demonstrate Patient specific functional scale >5 to indicate reduced disability due to condition.  Goal status: updated, 09/28/2023  6. Lt Knee flexion  PROM -2 to 100* Goal status: updated, 09/28/2023   PLAN:  PT FREQUENCY:  4-5x/week for 2 weeks then 2-3x/week for 6 weeks  PT DURATION: 8 weeks  PLANNED INTERVENTIONS: 97750- Physical Performance Testing, 97110-Therapeutic exercises, 97530- Therapeutic activity, 97112- Neuromuscular re-education, 97535- Self Care, 02859- Manual therapy, 97116- Gait training, and 97016- Vasopneumatic device  PLAN FOR NEXT SESSION: Needs continued strengthening (OKC, CKC ) while working on motion gains.    Ozell Silvan, PT, DPT, OCS, ATC 10/18/23  10:51 AM

## 2023-10-19 ENCOUNTER — Inpatient Hospital Stay: Admission: RE | Admit: 2023-10-19 | Discharge: 2023-10-19 | Attending: Family Medicine | Admitting: Family Medicine

## 2023-10-19 DIAGNOSIS — Z1231 Encounter for screening mammogram for malignant neoplasm of breast: Secondary | ICD-10-CM | POA: Diagnosis not present

## 2023-10-19 NOTE — Therapy (Signed)
 OUTPATIENT PHYSICAL THERAPY TREATMENT  Patient Name: Patricia Hoover MRN: [id number] DOB:31-Oct-1945, 78 y.o., female Today's Date: 10/20/2023     END OF SESSION:  PT End of Session - 10/20/23 1232     Visit Number 21    Number of Visits 30    Date for Recertification  11/11/23    Authorization Type MCR    Authorization Time Period 08/12/23 to 10/07/23    PT Start Time 1100    PT Stop Time 1150    PT Time Calculation (min) 50 min    Activity Tolerance Patient tolerated treatment well    Behavior During Therapy [facility] for tasks assessed/performed             Past Medical History:  Diagnosis Date   Angioedema 06/15/2017   Arrhythmia    possible hx, resolved prev   Blood transfusion without reported diagnosis 1972   had reaction; had to stop   Carpal tunnel syndrome    Cataract 2019   resolved with surgery   Complication of anesthesia    takes a long to wake up   Duodenal ulcer    H/O exercise stress test 2012   normal   Heart murmur    as child   Hematochezia    High cholesterol    History of kidney stones    Hx of colonic polyps    Hypertension    Osteoporosis 2010   t score - 3.9   Peptic ulcer of duodenum    Renal stones    Vitamin D  deficiency 03/01/2021   Past Surgical History:  Procedure Laterality Date   ABDOMINAL HYSTERECTOMY  1972   for endometriosis   CATARACT EXTRACTION W/ INTRAOCULAR LENS IMPLANT Right 06/2017   CATARACT EXTRACTION W/ INTRAOCULAR LENS IMPLANT Left 07/2017   COLONOSCOPY Left 03/02/2017   Procedure: COLONOSCOPY;  Surgeon: Janalyn Keene NOVAK, MD;  Location: [facility] ENDOSCOPY;  Service: Endoscopy;  Laterality: Left;   COLONOSCOPY WITH PROPOFOL  N/A 05/04/2016   Procedure: COLONOSCOPY WITH PROPOFOL ;  Surgeon: Ruel Kung, MD;  Location: [facility] ENDOSCOPY;  Service: Endoscopy;  Laterality: N/A;   ESOPHAGOGASTRODUODENOSCOPY Left 03/02/2017   Procedure: ESOPHAGOGASTRODUODENOSCOPY (EGD);  Surgeon: Janalyn Keene NOVAK, MD;  Location: [facility] ENDOSCOPY;   Service: Endoscopy;  Laterality: Left;   ESOPHAGOGASTRODUODENOSCOPY (EGD) WITH PROPOFOL  N/A 04/08/2017   Procedure: ESOPHAGOGASTRODUODENOSCOPY (EGD) WITH PROPOFOL ;  Surgeon: Janalyn Keene NOVAK, MD;  Location: [facility] ENDOSCOPY;  Service: Endoscopy;  Laterality: N/A;   EXAM UNDER ANESTHESIA WITH MANIPULATION OF KNEE Left 09/13/2023   Procedure: MANIPULATION, JOINT, KNEE, WITH ANESTHESIA;  Surgeon: Addie Cordella Hamilton, MD;  Location: 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   Procedure: ARTHROPLASTY, KNEE, TOTAL, LEFT;  Surgeon: Addie Cordella Hamilton, MD;  Location: [facility] OR;  Service: Orthopedics;  Laterality: Left;   Patient Active Problem List   Diagnosis Date Noted   Arthrofibrosis of knee joint, left 09/18/2023   Arthritis of left knee 07/26/2023   Lower GI bleeding 06/29/2023   Aortic atherosclerosis 03/09/2023   Unilateral primary osteoarthritis, left knee 11/26/2022   Dysuria 04/21/2022   Stiff-legged gait 04/21/2022   Scalp lesion 09/21/2021   Clavicle enlargement 04/05/2021   Hyperpigmentation 03/01/2021   Vitamin D  deficiency 03/01/2021   Leg pain 05/21/2019   Right leg pain 05/21/2019   GI bleed 03/04/2018   Healthcare maintenance 01/08/2018   Paresthesia 01/08/2018   Neck  pain 08/31/2017   Angioedema 06/15/2017   External hemorrhoids    Diverticulosis of large intestine without diverticulitis    Internal hemorrhoids    Advance care planning 12/23/2016   Renal stone 04/20/2016   Heartburn 12/17/2015   Medicare annual wellness visit, subsequent 06/05/2013   Hyperglycemia 06/05/2013   Knee pain 08/31/2012   Age-related osteoporosis without current pathological fracture 08/18/2012   HTN (hypertension) 08/09/2012   HLD (hyperlipidemia) 08/09/2012    PCP: Cleatus Molly MD   REFERRING PROVIDER: Addie Cordella Hamilton, MD  REFERRING DIAG: Diagnosis [phone]  (ICD-10-CM) - Chronic pain of left knee M17.12 (ICD-10-CM) - Unilateral primary osteoarthritis, left knee  THERAPY DIAG:  Stiffness of left knee, not elsewhere classified  Chronic pain of left knee  Difficulty in walking, not elsewhere classified  Muscle weakness (generalized)  Other abnormalities of gait and mobility  Localized edema  Unsteadiness on feet  Rationale for Evaluation and Treatment: Rehabilitation  ONSET DATE: Lt TKR 07/26/23  SUBJECTIVE:   SUBJECTIVE STATEMENT:   Pt reports continued stiffness in the morning and pain at night.   PERTINENT HISTORY: Left TKA 07/26/2023, OA, osteoporosis, heart murmur, HTN  PAIN:  NPRS scale: no pain with ADLs only sleeping Pain location: Lt knee  Pain description:  stiffness, tightness.  Aggravating factors: bending it  Relieving factors: nothing besides pain pills   PRECAUTIONS: None  RED FLAGS: None   WEIGHT BEARING RESTRICTIONS: No  FALLS:  Has patient fallen in last 6 months? No  LIVING ENVIRONMENT: Lives with: lives with their family Lives in: House/apartment Stairs:  flight inside but does not have to go up  Has following equipment at home: Vannie - 2 wheeled  OCCUPATION: retiredWater engineer at an [employer]   PLOF: Independent, Independent with basic ADLs, Independent with gait, and Independent with transfers  PATIENT GOALS: be able to line dance   NEXT MD VISIT: Referring 09/23/2023  OBJECTIVE:  Note: Objective measures were completed at Evaluation unless otherwise noted.  PATIENT SURVEYS:  PSFS: THE PATIENT SPECIFIC FUNCTIONAL SCALE  Place score of 0-10 (0 = unable to perform activity and 10 = able to perform activity at the same level as before injury or problem)  Activity Date: 08/12/23 09/14/23 09/28/2023    Bending knee  5 1 5   2. Lifting leg  5 1 6   3.walking  2 8  4. Standing for ADLs  2 6  Total Score 5 1.5 6.25    Total Score = Sum of activity scores/number of  activities  Minimally Detectable Change: 3 points (for single activity); 2 points (for average score)   COGNITION: 08/12/2023 Overall cognitive status: Within functional limits for tasks assessed    3 SENSATION: 08/12/2023 [facility]  EDEMA:  08/12/2023 Localized edema appropriate for post-op state    LOWER EXTREMITY ROM:  Active ROM Left eval Left  08/22/23 Left  08/24/23 Left 09/01/23 Left  09/14/23 Left 09/20/23 Left 09/28/2023 Left 10/18/2023 Left  10/20/23  Hip flexion           Hip extension           Hip abduction           Hip adduction           Hip internal rotation           Hip external rotation           Knee flexion 67 64* 73* 62 in supine AROM heel slide  72 in PROM heel slide Supine  90/90 position A: 68* Supine A: 60 P: 65 Sitting:  P: 70 Supine AROM heel slide: 71  Supine PROM: 74 Supine AROM heel slide: 82  Supine passive range heel slide: 86   Supine  A 80  Knee extension -9 -5* -5* -8 in seated AROM LAQ  Supine -9* Supine P: -5 Seated -6 AROM in heel prop    Ankle dorsiflexion           Ankle plantarflexion           Ankle inversion           Ankle eversion            (Blank rows = not tested)  LOWER EXTREMITY MMT:  MMT Left Eval 08/12/2023 Left 09/14/23 Left 09/28/2023 Right 10/18/2023 Left 10/18/2023  Hip flexion   5/5    Hip extension       Hip abduction       Hip adduction       Hip internal rotation       Hip external rotation       Knee flexion 3+ 3-/5 4+/5    Knee extension 3+ 3-/5 4/5 5/5 37, 37.3 lbs 4/5 22, 20.6 lbs  Ankle dorsiflexion   5/5    Ankle plantarflexion       Ankle inversion       Ankle eversion        (Blank rows = not tested)    GAIT: 09/28/2023: Ambulation with SPC in Rt UE with some quality improvements in knee flexion in swing.  Lacking TKE still noted in stance.   08/30/2023: Ambulation in clinic c SPC in Rt UE with SBA.  Reduced knee extension in stance, maintaining knee flexion.    08/12/2023 Distance walked: in clinic distances  Assistive device utilized: Vannie - 2 wheeled Level of assistance: Modified independence Comments: antalgic, limited knee ROM during gait cycle                  TREATMENT        10/20/23 L knee Therex: UBE UE/LE for knee ROM seat 5 partial circles 5 second stretching to full revolutions 10 mins  (5 mins of each) Seated Lt knee LAQ 7 lbs 2 x 15 , performed bilaterally  Supine bridge 2 x 5 with heel slide walk up after each set for flexion gains.  Incline gastroc stretch 30 sec x 3 bilateral  Squats 2x10  Incline board stretch Manual Seated Lt knee flexion c distraction/IR mobilization c movement.  Passive flexion. Vaso:  Lt LE elevated on wedge with medium compression for 10 minutes at 34deg     DATE: 10/18/2023 Therex: UBE UE/LE for knee ROM seat 5 partial circles 5 second stretching to full revolutions 10 mins  (5 mins of each) Seated Lt knee LAQ 7 lbs 2 x 15 , performed bilaterally  Seated isometric Lt leg extension 5 sec hold x 10 with contralateral leg pushing back( cues for home use) Supine Lt knee flexion/extension end range stretching 5 sec holds x 10 in 90 deg hip flexion Supine bridge 2 x 5 with heel slide walk up after each set for flexion gains.  Incline gastroc stretch 30 sec x 3 bilateral   Manual Seated Lt knee flexion c distraction/IR mobilization c movement.  Contract relax for extension and flexion gains.    TREATMENT       DATE: 09/30/23  Therex: Recumbent bike, level 1, seat 2 for 9 minutes Glute bridge MWM  Neuro Re-Ed (for muscle activation) Quad set: opposite leg, Lt LE, both LE, 3x each for 3 sets  Bilateral SAQ, 2x8  Manual Grade 3/4 mobs for knee extension  Contract, relax technique for knee flexion   Vaso:  Lt LE elevated on wedge with medium compression for 10 minutes at 34deg  TREATMENT       DATE: 09/29/23 Therex: UBE UE/LE for knee ROM to tolerance partial circles mainly with full  mixed in around 6 mins in - 8 mins total , seat 5 Supine knee extension with heel prop and 4# weight pulling knee into extension 3 min SLR 20x Seated Lt leg LAQ 3 x 10 for ROM  TherActivity: Mini squats with UE support  Sit to stands  Manual: Passive extension  PATIENT EDUCATION:  Education details: exam findings, POC, course of care following TKR  Person educated: Patient Education method: Explanation and Demonstration Education comprehension: verbalized understanding, returned demonstration, and needs further education  HOME EXERCISE PROGRAM: Access Code: [email or web address] URL: [email or web address] Date: 09/22/2023 Prepared by: Ismael Theophilus Stallion Access Code: [email or web address] URL: [email or web address] Date: 09/26/2023 Prepared by: Burnard Meth  Exercises - Supine Heel Slide with Strap  - 5 x daily - 7 x weekly - 1 sets - 10 reps - 10 seconds hold - Supine Knee Extension Stretch on Towel Roll  - 1 x daily - 7 x weekly - 3 sets - 10 reps - Seated Knee Flexion Extension AROM   - 1 x daily - 7 x weekly - 3 sets - 10 reps - Seated Knee Flexion AAROM  - 1 x daily - 7 x weekly - 3 sets - 10 reps - Supine Short Arc Quad  - 1 x daily - 7 x weekly - 3 sets - 10 reps - Standing Terminal Knee Extension with Resistance  - 1 x daily - 7 x weekly - 2-3 sets - 10 reps - 5 seconds hold - Mini Squat with Counter Support  - 2 x daily - 7 x weekly - 2 sets - 10 reps - 5 hold  ASSESSMENT:  CLINICAL IMPRESSION:    Needed VC for squatting form.  Demonstrated understanding. OBJECTIVE IMPAIRMENTS: Abnormal gait, decreased activity tolerance, decreased balance, decreased knowledge of use of DME, decreased mobility, difficulty walking, decreased ROM, decreased strength, hypomobility, increased muscle spasms, impaired flexibility, and pain.   ACTIVITY LIMITATIONS: sitting, standing, squatting, sleeping, stairs, transfers, bed mobility, and locomotion level  PARTICIPATION  LIMITATIONS: driving, shopping, community activity, yard work, and church  PERSONAL FACTORS: Age, Behavior pattern, Education, Fitness, Past/current experiences, Social background, and Time since onset of injury/illness/exacerbation are also affecting patient's functional outcome.   REHAB POTENTIAL: Good  CLINICAL DECISION MAKING: Stable/uncomplicated  EVALUATION COMPLEXITY: Low   GOALS: Goals reviewed with patient? No  SHORT TERM GOALS: Target date: 10/07/2023   Will be compliant with appropriate progressive HEP Goal status: Met  2. Lt knee ROM 5-85* Goal status: partially met  3. Will be independent with edema management strategies  Goal status: mostly met   LONG TERM GOALS: Target date: 11/11/2023   Patient will demonstrate Lt knee MMT WFL throughout to faciltiate usual transfers, stairs, squatting at PLOF for daily life.  Goal status: updated, 09/28/2023  2. L Knee flexion AROM -5 to 90* Goal status: updated, 09/28/2023  3. Will be able to ascend and descend stairs reciprocally with no increase in pain Goal status: updated, 09/28/2023  4. Will be able to ambulate community distances and perform all household tasks with no  increase in pain w/ LRAD  Goal status: updated, 09/28/2023  5.  Patient will demonstrate Patient specific functional scale >5 to indicate reduced disability due to condition.  Goal status: updated, 09/28/2023  6. Lt Knee flexion PROM -2 to 100* Goal status: updated, 09/28/2023   PLAN:  PT FREQUENCY:  4-5x/week for 2 weeks then 2-3x/week for 6 weeks  PT DURATION: 8 weeks  PLANNED INTERVENTIONS: 97750- Physical Performance Testing, 97110-Therapeutic exercises, 97530- Therapeutic activity, 97112- Neuromuscular re-education, 97535- Self Care, [id number]- Manual therapy, [id number]- Gait training, and 97016- Vasopneumatic device  PLAN FOR NEXT SESSION: Needs continued strengthening (OKC, CKC ) while working on motion gains.    Burnard Meth, PT 10/20/23  12:34  PM

## 2023-10-20 ENCOUNTER — Ambulatory Visit

## 2023-10-20 DIAGNOSIS — R2681 Unsteadiness on feet: Secondary | ICD-10-CM

## 2023-10-20 DIAGNOSIS — R262 Difficulty in walking, not elsewhere classified: Secondary | ICD-10-CM | POA: Diagnosis not present

## 2023-10-20 DIAGNOSIS — R2689 Other abnormalities of gait and mobility: Secondary | ICD-10-CM | POA: Diagnosis not present

## 2023-10-20 DIAGNOSIS — M25562 Pain in left knee: Secondary | ICD-10-CM

## 2023-10-20 DIAGNOSIS — G8929 Other chronic pain: Secondary | ICD-10-CM | POA: Diagnosis not present

## 2023-10-20 DIAGNOSIS — M25662 Stiffness of left knee, not elsewhere classified: Secondary | ICD-10-CM | POA: Diagnosis not present

## 2023-10-20 DIAGNOSIS — R600 Localized edema: Secondary | ICD-10-CM

## 2023-10-20 DIAGNOSIS — M6281 Muscle weakness (generalized): Secondary | ICD-10-CM | POA: Diagnosis not present

## 2023-10-23 ENCOUNTER — Ambulatory Visit: Payer: Self-pay | Admitting: Family Medicine

## 2023-10-23 NOTE — Therapy (Signed)
 OUTPATIENT PHYSICAL THERAPY TREATMENT  Patient Name: Patricia Hoover MRN: [id number] DOB:1945-09-03, 78 y.o., female Today's Date: 10/24/2023     END OF SESSION:  PT End of Session - 10/24/23 1144     Visit Number 22    Number of Visits 30    Date for Recertification  11/11/23    Authorization Type MCR    Authorization Time Period 08/12/23 to 10/07/23    PT Start Time 1102    PT Stop Time 1152    PT Time Calculation (min) 50 min    Activity Tolerance No increased pain    Behavior During Therapy [facility] for tasks assessed/performed              Past Medical History:  Diagnosis Date   Angioedema 06/15/2017   Arrhythmia    possible hx, resolved prev   Blood transfusion without reported diagnosis 1972   had reaction; had to stop   Carpal tunnel syndrome    Cataract 2019   resolved with surgery   Complication of anesthesia    takes a long to wake up   Duodenal ulcer    H/O exercise stress test 2012   normal   Heart murmur    as child   Hematochezia    High cholesterol    History of kidney stones    Hx of colonic polyps    Hypertension    Osteoporosis 2010   t score - 3.9   Peptic ulcer of duodenum    Renal stones    Vitamin D  deficiency 03/01/2021   Past Surgical History:  Procedure Laterality Date   ABDOMINAL HYSTERECTOMY  1972   for endometriosis   CATARACT EXTRACTION W/ INTRAOCULAR LENS IMPLANT Right 06/2017   CATARACT EXTRACTION W/ INTRAOCULAR LENS IMPLANT Left 07/2017   COLONOSCOPY Left 03/02/2017   Procedure: COLONOSCOPY;  Surgeon: Janalyn Keene NOVAK, MD;  Location: [facility] ENDOSCOPY;  Service: Endoscopy;  Laterality: Left;   COLONOSCOPY WITH PROPOFOL  N/A 05/04/2016   Procedure: COLONOSCOPY WITH PROPOFOL ;  Surgeon: Ruel Kung, MD;  Location: [facility] ENDOSCOPY;  Service: Endoscopy;  Laterality: N/A;   ESOPHAGOGASTRODUODENOSCOPY Left 03/02/2017   Procedure: ESOPHAGOGASTRODUODENOSCOPY (EGD);  Surgeon: Janalyn Keene NOVAK, MD;  Location: [facility] ENDOSCOPY;  Service:  Endoscopy;  Laterality: Left;   ESOPHAGOGASTRODUODENOSCOPY (EGD) WITH PROPOFOL  N/A 04/08/2017   Procedure: ESOPHAGOGASTRODUODENOSCOPY (EGD) WITH PROPOFOL ;  Surgeon: Janalyn Keene NOVAK, MD;  Location: [facility] ENDOSCOPY;  Service: Endoscopy;  Laterality: N/A;   EXAM UNDER ANESTHESIA WITH MANIPULATION OF KNEE Left 09/13/2023   Procedure: MANIPULATION, JOINT, KNEE, WITH ANESTHESIA;  Surgeon: Addie Cordella Hamilton, MD;  Location: [facility] OR;  Service: Orthopedics;  Laterality: Left;   LITHOTRIPSY     for kidney stone x5   LITHOTRIPSY  09/2017   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   Procedure: ARTHROPLASTY, KNEE, TOTAL, LEFT;  Surgeon: Addie Cordella Hamilton, MD;  Location: [facility] OR;  Service: Orthopedics;  Laterality: Left;   Patient Active Problem List   Diagnosis Date Noted   Arthrofibrosis of knee joint, left 09/18/2023   Arthritis of left knee 07/26/2023   Lower GI bleeding 06/29/2023   Aortic atherosclerosis 03/09/2023   Unilateral primary osteoarthritis, left knee 11/26/2022   Dysuria 04/21/2022   Stiff-legged gait 04/21/2022   Scalp lesion 09/21/2021   Clavicle enlargement 04/05/2021   Hyperpigmentation 03/01/2021   Vitamin D  deficiency 03/01/2021   Leg pain 05/21/2019   Right leg pain 05/21/2019   GI bleed 03/04/2018   Healthcare maintenance 01/08/2018   Paresthesia 01/08/2018   Neck  pain 08/31/2017   Angioedema 06/15/2017   External hemorrhoids    Diverticulosis of large intestine without diverticulitis    Internal hemorrhoids    Advance care planning 12/23/2016   Renal stone 04/20/2016   Heartburn 12/17/2015   Medicare annual wellness visit, subsequent 06/05/2013   Hyperglycemia 06/05/2013   Knee pain 08/31/2012   Age-related osteoporosis without current pathological fracture 08/18/2012   HTN (hypertension) 08/09/2012   HLD (hyperlipidemia) 08/09/2012    PCP: Cleatus Molly MD   REFERRING PROVIDER: Addie Cordella Hamilton, MD  REFERRING DIAG: Diagnosis [phone] (ICD-10-CM) -  Chronic pain of left knee M17.12 (ICD-10-CM) - Unilateral primary osteoarthritis, left knee  THERAPY DIAG:  Stiffness of left knee, not elsewhere classified  Chronic pain of left knee  Difficulty in walking, not elsewhere classified  Muscle weakness (generalized)  Other abnormalities of gait and mobility  Localized edema  Unsteadiness on feet  Rationale for Evaluation and Treatment: Rehabilitation  ONSET DATE: Lt TKR 07/26/23  SUBJECTIVE:   SUBJECTIVE STATEMENT:   Pt reports able to do more at home with less knee pain.   PERTINENT HISTORY: Left TKA 07/26/2023, OA, osteoporosis, heart murmur, HTN  PAIN:  NPRS scale: no pain today Pain location: Lt knee  Pain description:  stiffness, tightness.  Aggravating factors: bending it  Relieving factors: nothing besides pain pills   PRECAUTIONS: None  RED FLAGS: None   WEIGHT BEARING RESTRICTIONS: No  FALLS:  Has patient fallen in last 6 months? No  LIVING ENVIRONMENT: Lives with: lives with their family Lives in: House/apartment Stairs:  flight inside but does not have to go up  Has following equipment at home: Vannie - 2 wheeled  OCCUPATION: retiredWater engineer at an [employer]   PLOF: Independent, Independent with basic ADLs, Independent with gait, and Independent with transfers  PATIENT GOALS: be able to line dance   NEXT MD VISIT: Referring 09/23/2023  OBJECTIVE:  Note: Objective measures were completed at Evaluation unless otherwise noted.  PATIENT SURVEYS:  PSFS: THE PATIENT SPECIFIC FUNCTIONAL SCALE  Place score of 0-10 (0 = unable to perform activity and 10 = able to perform activity at the same level as before injury or problem)  Activity Date: 08/12/23 09/14/23 09/28/2023    Bending knee  5 1 5   2. Lifting leg  5 1 6   3.walking  2 8  4. Standing for ADLs  2 6  Total Score 5 1.5 6.25    Total Score = Sum of activity scores/number of activities  Minimally Detectable Change: 3 points (for single  activity); 2 points (for average score)   COGNITION: 08/12/2023 Overall cognitive status: Within functional limits for tasks assessed    3 SENSATION: 08/12/2023 [facility]  EDEMA:  08/12/2023 Localized edema appropriate for post-op state    LOWER EXTREMITY ROM:  Active ROM Left eval Left  08/22/23 Left  08/24/23 Left 09/01/23 Left  09/14/23 Left 09/20/23 Left 09/28/2023 Left 10/18/2023 Left  10/20/23  Hip flexion           Hip extension           Hip abduction           Hip adduction           Hip internal rotation           Hip external rotation           Knee flexion 67 64* 73* 62 in supine AROM heel slide  72 in PROM heel slide Supine 90/90 position  A: 68* Supine A: 60 P: 65 Sitting:  P: 70 Supine AROM heel slide: 71  Supine PROM: 74 Supine AROM heel slide: 82  Supine passive range heel slide: 86   Supine  A 80  Knee extension -9 -5* -5* -8 in seated AROM LAQ  Supine -9* Supine P: -5 Seated -6 AROM in heel prop    Ankle dorsiflexion           Ankle plantarflexion           Ankle inversion           Ankle eversion            (Blank rows = not tested)  LOWER EXTREMITY MMT:  MMT Left Eval 08/12/2023 Left 09/14/23 Left 09/28/2023 Right 10/18/2023 Left 10/18/2023  Hip flexion   5/5    Hip extension       Hip abduction       Hip adduction       Hip internal rotation       Hip external rotation       Knee flexion 3+ 3-/5 4+/5    Knee extension 3+ 3-/5 4/5 5/5 37, 37.3 lbs 4/5 22, 20.6 lbs  Ankle dorsiflexion   5/5    Ankle plantarflexion       Ankle inversion       Ankle eversion        (Blank rows = not tested)    GAIT: 09/28/2023: Ambulation with SPC in Rt UE with some quality improvements in knee flexion in swing.  Lacking TKE still noted in stance.   08/30/2023: Ambulation in clinic c SPC in Rt UE with SBA.  Reduced knee extension in stance, maintaining knee flexion.   08/12/2023 Distance walked: in clinic distances  Assistive device utilized:  Vannie - 2 wheeled Level of assistance: Modified independence Comments: antalgic, limited knee ROM during gait cycle                  TREATMENT  L knee      10/24/23 Therex: UBE UE/LE for knee ROM seat 5 then 4   (5 mins of each) Seated Lt knee LAQ 7 lbs 2 x 15 , performed bilaterally  Supine bridge 2 x 5 with heel slide walk up after each set for flexion gains.  Incline gastroc stretch 30 sec x 3 bilateral  Squats 2x10  Incline board stretch There act for transfers and stairs Step ups 6 step Lean ins on 6 step. Lateral lifts  Manual Seated Lt knee flexion c distraction/IR mobilization c movement.  Passive flexion. Vaso:  Lt LE elevated on wedge with medium compression for 10 minutes at 34deg     10/20/23 Therex: UBE UE/LE for knee ROM seat 5 partial circles 5 second stretching to full revolutions 10 mins  (5 mins of each) Seated Lt knee LAQ 7 lbs 2 x 15 , performed bilaterally  Supine bridge 2 x 5 with heel slide walk up after each set for flexion gains.  Incline gastroc stretch 30 sec x 3 bilateral  Squats 2x10  Incline board stretch Manual Seated Lt knee flexion c distraction/IR mobilization c movement.  Passive flexion. Vaso:  Lt LE elevated on wedge with medium compression for 10 minutes at 34deg     DATE: 10/18/2023 Therex: UBE UE/LE for knee ROM seat 5 partial circles 5 second stretching to full revolutions 10 mins  (5 mins of each) Seated Lt knee LAQ 7 lbs 2 x 15 , performed  bilaterally  Seated isometric Lt leg extension 5 sec hold x 10 with contralateral leg pushing back( cues for home use) Supine Lt knee flexion/extension end range stretching 5 sec holds x 10 in 90 deg hip flexion Supine bridge 2 x 5 with heel slide walk up after each set for flexion gains.  Incline gastroc stretch 30 sec x 3 bilateral   Manual Seated Lt knee flexion c distraction/IR mobilization c movement.  Contract relax for extension and flexion gains.    TREATMENT       DATE:  09/30/23  Therex: Recumbent bike, level 1, seat 2 for 9 minutes Glute bridge MWM  Neuro Re-Ed (for muscle activation) Quad set: opposite leg, Lt LE, both LE, 3x each for 3 sets  Bilateral SAQ, 2x8  Manual Grade 3/4 mobs for knee extension  Contract, relax technique for knee flexion   Vaso:  Lt LE elevated on wedge with medium compression for 10 minutes at 34deg  TREATMENT       DATE: 09/29/23 Therex: UBE UE/LE for knee ROM to tolerance partial circles mainly with full mixed in around 6 mins in - 8 mins total , seat 5 Supine knee extension with heel prop and 4# weight pulling knee into extension 3 min SLR 20x Seated Lt leg LAQ 3 x 10 for ROM  TherActivity: Mini squats with UE support  Sit to stands  Manual: Passive extension  PATIENT EDUCATION:  Education details: exam findings, POC, course of care following TKR  Person educated: Patient Education method: Explanation and Demonstration Education comprehension: verbalized understanding, returned demonstration, and needs further education  HOME EXERCISE PROGRAM: Access Code: [email or web address] URL: [email or web address] Date: 09/22/2023 Prepared by: Ismael Theophilus Stallion Access Code: [email or web address] URL: [email or web address] Date: 09/26/2023 Prepared by: Burnard Meth  Exercises - Supine Heel Slide with Strap  - 5 x daily - 7 x weekly - 1 sets - 10 reps - 10 seconds hold - Supine Knee Extension Stretch on Towel Roll  - 1 x daily - 7 x weekly - 3 sets - 10 reps - Seated Knee Flexion Extension AROM   - 1 x daily - 7 x weekly - 3 sets - 10 reps - Seated Knee Flexion AAROM  - 1 x daily - 7 x weekly - 3 sets - 10 reps - Supine Short Arc Quad  - 1 x daily - 7 x weekly - 3 sets - 10 reps - Standing Terminal Knee Extension with Resistance  - 1 x daily - 7 x weekly - 2-3 sets - 10 reps - 5 seconds hold - Mini Squat with Counter Support  - 2 x daily - 7 x weekly - 2 sets - 10 reps - 5  hold  ASSESSMENT:  CLINICAL IMPRESSION:    Very stiff into flexion.  No flexion gains.   OBJECTIVE IMPAIRMENTS: Abnormal gait, decreased activity tolerance, decreased balance, decreased knowledge of use of DME, decreased mobility, difficulty walking, decreased ROM, decreased strength, hypomobility, increased muscle spasms, impaired flexibility, and pain.   ACTIVITY LIMITATIONS: sitting, standing, squatting, sleeping, stairs, transfers, bed mobility, and locomotion level  PARTICIPATION LIMITATIONS: driving, shopping, community activity, yard work, and church  PERSONAL FACTORS: Age, Behavior pattern, Education, Fitness, Past/current experiences, Social background, and Time since onset of injury/illness/exacerbation are also affecting patient's functional outcome.   REHAB POTENTIAL: Good  CLINICAL DECISION MAKING: Stable/uncomplicated  EVALUATION COMPLEXITY: Low   GOALS: Goals reviewed with patient? No  SHORT TERM GOALS: Target date: 10/07/2023   Will be compliant with appropriate  progressive HEP Goal status: Met  2. Lt knee ROM 5-85* Goal status: partially met  3. Will be independent with edema management strategies  Goal status: mostly met   LONG TERM GOALS: Target date: 11/11/2023   Patient will demonstrate Lt knee MMT WFL throughout to faciltiate usual transfers, stairs, squatting at PLOF for daily life.  Goal status: updated, 09/28/2023  2. L Knee flexion AROM -5 to 90* Goal status: updated, 09/28/2023  3. Will be able to ascend and descend stairs reciprocally with no increase in pain Goal status: updated, 09/28/2023  4. Will be able to ambulate community distances and perform all household tasks with no increase in pain w/ LRAD  Goal status: updated, 09/28/2023  5.  Patient will demonstrate Patient specific functional scale >5 to indicate reduced disability due to condition.  Goal status: updated, 09/28/2023  6. Lt Knee flexion PROM -2 to 100* Goal status: updated,  09/28/2023   PLAN:  PT FREQUENCY:  4-5x/week for 2 weeks then 2-3x/week for 6 weeks  PT DURATION: 8 weeks  PLANNED INTERVENTIONS: 97750- Physical Performance Testing, 97110-Therapeutic exercises, 97530- Therapeutic activity, 97112- Neuromuscular re-education, 97535- Self Care, [id number]- Manual therapy, 97116- Gait training, and 97016- Vasopneumatic device  PLAN FOR NEXT SESSION: Needs continued strengthening (OKC, CKC ) while working on motion gains.    Burnard Meth, PT 10/24/23  11:49 AM

## 2023-10-24 ENCOUNTER — Ambulatory Visit (INDEPENDENT_AMBULATORY_CARE_PROVIDER_SITE_OTHER)

## 2023-10-24 DIAGNOSIS — R262 Difficulty in walking, not elsewhere classified: Secondary | ICD-10-CM

## 2023-10-24 DIAGNOSIS — R600 Localized edema: Secondary | ICD-10-CM

## 2023-10-24 DIAGNOSIS — G8929 Other chronic pain: Secondary | ICD-10-CM | POA: Diagnosis not present

## 2023-10-24 DIAGNOSIS — M25662 Stiffness of left knee, not elsewhere classified: Secondary | ICD-10-CM | POA: Diagnosis not present

## 2023-10-24 DIAGNOSIS — M6281 Muscle weakness (generalized): Secondary | ICD-10-CM | POA: Diagnosis not present

## 2023-10-24 DIAGNOSIS — R2689 Other abnormalities of gait and mobility: Secondary | ICD-10-CM | POA: Diagnosis not present

## 2023-10-24 DIAGNOSIS — M25562 Pain in left knee: Secondary | ICD-10-CM | POA: Diagnosis not present

## 2023-10-24 DIAGNOSIS — R2681 Unsteadiness on feet: Secondary | ICD-10-CM

## 2023-10-25 NOTE — Therapy (Signed)
 OUTPATIENT PHYSICAL THERAPY TREATMENT  Patient Name: Patricia Hoover MRN: [id number] DOB:03-13-1945, 78 y.o., female Today's Date: 10/26/2023     END OF SESSION:  PT End of Session - 10/26/23 1054     Visit Number 23    Number of Visits 30    Date for Recertification  11/11/23    Authorization Type MCR    Authorization Time Period 08/12/23 to 10/07/23    PT Start Time 1100    PT Stop Time 1148    PT Time Calculation (min) 48 min    Activity Tolerance Patient tolerated treatment well    Behavior During Therapy [facility] for tasks assessed/performed               Past Medical History:  Diagnosis Date   Angioedema 06/15/2017   Arrhythmia    possible hx, resolved prev   Blood transfusion without reported diagnosis 1972   had reaction; had to stop   Carpal tunnel syndrome    Cataract 2019   resolved with surgery   Complication of anesthesia    takes a long to wake up   Duodenal ulcer    H/O exercise stress test 2012   normal   Heart murmur    as child   Hematochezia    High cholesterol    History of kidney stones    Hx of colonic polyps    Hypertension    Osteoporosis 2010   t score - 3.9   Peptic ulcer of duodenum    Renal stones    Vitamin D  deficiency 03/01/2021   Past Surgical History:  Procedure Laterality Date   ABDOMINAL HYSTERECTOMY  1972   for endometriosis   CATARACT EXTRACTION W/ INTRAOCULAR LENS IMPLANT Right 06/2017   CATARACT EXTRACTION W/ INTRAOCULAR LENS IMPLANT Left 07/2017   COLONOSCOPY Left 03/02/2017   Procedure: COLONOSCOPY;  Surgeon: Janalyn Keene NOVAK, MD;  Location: [facility] ENDOSCOPY;  Service: Endoscopy;  Laterality: Left;   COLONOSCOPY WITH PROPOFOL  N/A 05/04/2016   Procedure: COLONOSCOPY WITH PROPOFOL ;  Surgeon: Ruel Kung, MD;  Location: [facility] ENDOSCOPY;  Service: Endoscopy;  Laterality: N/A;   ESOPHAGOGASTRODUODENOSCOPY Left 03/02/2017   Procedure: ESOPHAGOGASTRODUODENOSCOPY (EGD);  Surgeon: Janalyn Keene NOVAK, MD;  Location: [facility]  ENDOSCOPY;  Service: Endoscopy;  Laterality: Left;   ESOPHAGOGASTRODUODENOSCOPY (EGD) WITH PROPOFOL  N/A 04/08/2017   Procedure: ESOPHAGOGASTRODUODENOSCOPY (EGD) WITH PROPOFOL ;  Surgeon: Janalyn Keene NOVAK, MD;  Location: [facility] ENDOSCOPY;  Service: Endoscopy;  Laterality: N/A;   EXAM UNDER ANESTHESIA WITH MANIPULATION OF KNEE Left 09/13/2023   Procedure: MANIPULATION, JOINT, KNEE, WITH ANESTHESIA;  Surgeon: Addie Cordella Hamilton, MD;  Location[facility] OR;  Service: Orthopedics;  Laterality: Left;   LITHOTRIPSY     for kidney stone x5   LITHOTRIPSY  09/2017   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   Procedure: ARTHROPLASTY, KNEE, TOTAL, LEFT;  Surgeon: Addie Cordella Hamilton, MD;  Location: [facility] OR;  Service: Orthopedics;  Laterality: Left;   Patient Active Problem List   Diagnosis Date Noted   Arthrofibrosis of knee joint, left 09/18/2023   Arthritis of left knee 07/26/2023   Lower GI bleeding 06/29/2023   Aortic atherosclerosis 03/09/2023   Unilateral primary osteoarthritis, left knee 11/26/2022   Dysuria 04/21/2022   Stiff-legged gait 04/21/2022   Scalp lesion 09/21/2021   Clavicle enlargement 04/05/2021   Hyperpigmentation 03/01/2021   Vitamin D  deficiency 03/01/2021   Leg pain 05/21/2019   Right leg pain 05/21/2019   GI bleed 03/04/2018   Healthcare maintenance 01/08/2018   Paresthesia 01/08/2018  Neck pain 08/31/2017   Angioedema 06/15/2017   External hemorrhoids    Diverticulosis of large intestine without diverticulitis    Internal hemorrhoids    Advance care planning 12/23/2016   Renal stone 04/20/2016   Heartburn 12/17/2015   Medicare annual wellness visit, subsequent 06/05/2013   Hyperglycemia 06/05/2013   Knee pain 08/31/2012   Age-related osteoporosis without current pathological fracture 08/18/2012   HTN (hypertension) 08/09/2012   HLD (hyperlipidemia) 08/09/2012    PCP: Cleatus Molly MD   REFERRING PROVIDER: Addie Cordella Hamilton, MD  REFERRING DIAG: Diagnosis  [phone] (ICD-10-CM) - Chronic pain of left knee M17.12 (ICD-10-CM) - Unilateral primary osteoarthritis, left knee  THERAPY DIAG:  Stiffness of left knee, not elsewhere classified  Chronic pain of left knee  Difficulty in walking, not elsewhere classified  Muscle weakness (generalized)  Other abnormalities of gait and mobility  Localized edema  Rationale for Evaluation and Treatment: Rehabilitation  ONSET DATE: Lt TKR 07/26/23  SUBJECTIVE:   SUBJECTIVE STATEMENT:   Pt states it continues to be stiff.   PERTINENT HISTORY: Left TKA 07/26/2023, OA, osteoporosis, heart murmur, HTN  PAIN:  NPRS scale: no pain today Pain location: Lt knee  Pain description:  stiffness, tightness.  Aggravating factors: bending it  Relieving factors: nothing besides pain pills   PRECAUTIONS: None  RED FLAGS: None   WEIGHT BEARING RESTRICTIONS: No  FALLS:  Has patient fallen in last 6 months? No  LIVING ENVIRONMENT: Lives with: lives with their family Lives in: House/apartment Stairs:  flight inside but does not have to go up  Has following equipment at home: Vannie - 2 wheeled  OCCUPATION: retiredWater engineer at an [employer]   PLOF: Independent, Independent with basic ADLs, Independent with gait, and Independent with transfers  PATIENT GOALS: be able to line dance   NEXT MD VISIT: Referring 09/23/2023  OBJECTIVE:  Note: Objective measures were completed at Evaluation unless otherwise noted.  PATIENT SURVEYS:  PSFS: THE PATIENT SPECIFIC FUNCTIONAL SCALE  Place score of 0-10 (0 = unable to perform activity and 10 = able to perform activity at the same level as before injury or problem)  Activity Date: 08/12/23 09/14/23 09/28/2023    Bending knee  5 1 5   2. Lifting leg  5 1 6   3.walking  2 8  4. Standing for ADLs  2 6  Total Score 5 1.5 6.25    Total Score = Sum of activity scores/number of activities  Minimally Detectable Change: 3 points (for single activity); 2  points (for average score)   COGNITION: 08/12/2023 Overall cognitive status: Within functional limits for tasks assessed    3 SENSATION: 08/12/2023 [facility]  EDEMA:  08/12/2023 Localized edema appropriate for post-op state    LOWER EXTREMITY ROM:  Active ROM Left eval Left  08/22/23 Left  08/24/23 Left 09/01/23 Left  09/14/23 Left 09/20/23 Left 09/28/2023 Left 10/18/2023 Left  10/20/23 Left  10/26/23  Hip flexion            Hip extension            Hip abduction            Hip adduction            Hip internal rotation            Hip external rotation            Knee flexion 67 64* 73* 62 in supine AROM heel slide  72 in PROM heel slide Supine 90/90  position A: 68* Supine A: 60 P: 65 Sitting:  P: 70 Supine AROM heel slide: 71  Supine PROM: 74 Supine AROM heel slide: 82  Supine passive range heel slide: 86   Supine  A 80 Supine  A 85 P 92  Knee extension -9 -5* -5* -8 in seated AROM LAQ  Supine -9* Supine P: -5 Seated -6 AROM in heel prop     Ankle dorsiflexion            Ankle plantarflexion            Ankle inversion            Ankle eversion             (Blank rows = not tested)  LOWER EXTREMITY MMT:  MMT Left Eval 08/12/2023 Left 09/14/23 Left 09/28/2023 Right 10/18/2023 Left 10/18/2023  Hip flexion   5/5    Hip extension       Hip abduction       Hip adduction       Hip internal rotation       Hip external rotation       Knee flexion 3+ 3-/5 4+/5    Knee extension 3+ 3-/5 4/5 5/5 37, 37.3 lbs 4/5 22, 20.6 lbs  Ankle dorsiflexion   5/5    Ankle plantarflexion       Ankle inversion       Ankle eversion        (Blank rows = not tested)    GAIT: 09/28/2023: Ambulation with SPC in Rt UE with some quality improvements in knee flexion in swing.  Lacking TKE still noted in stance.   08/30/2023: Ambulation in clinic c SPC in Rt UE with SBA.  Reduced knee extension in stance, maintaining knee flexion.   08/12/2023 Distance walked: in clinic  distances  Assistive device utilized: Vannie - 2 wheeled Level of assistance: Modified independence Comments: antalgic, limited knee ROM during gait cycle                  TREATMENT  L knee      10/26/23 Therex: Nustep level 3 7 min Seated Lt knee LAQ 7 lbs 2 x 15 , performed bilaterally  Supine bridge 2 x 5 with heel slide walk up after each set for flexion gains.  Incline gastroc stretch 30 sec x 3 bilateral  Squats 2x10   There act for transfers and stairs Leg press 75# 3x10  L LE #50 3x10 Step ups 6 step Lean ins on 6 step. Lateral lifts  Manual Seated Lt knee flexion c distraction/IR mobilization c movement.  Passive flexion. Vaso:  Lt LE elevated on wedge with medium compression for 10 minutes at 34deg   10/24/23 Therex: UBE UE/LE for knee ROM seat 5 then 4   (5 mins of each) Seated Lt knee LAQ 7 lbs 2 x 15 , performed bilaterally  Supine bridge 2 x 5 with heel slide walk up after each set for flexion gains.  Incline gastroc stretch 30 sec x 3 bilateral  Squats 2x10  Incline board stretch There act for transfers and stairs Step ups 6 step Lean ins on 6 step. Lateral lifts  Manual Seated Lt knee flexion c distraction/IR mobilization c movement.  Passive flexion. Vaso:  Lt LE elevated on wedge with medium compression for 10 minutes at 34deg     10/20/23 Therex: UBE UE/LE for knee ROM seat 5 partial circles 5 second stretching to full revolutions 10  mins  (5 mins of each) Seated Lt knee LAQ 7 lbs 2 x 15 , performed bilaterally  Supine bridge 2 x 5 with heel slide walk up after each set for flexion gains.  Incline gastroc stretch 30 sec x 3 bilateral  Squats 2x10  Incline board stretch Manual Seated Lt knee flexion c distraction/IR mobilization c movement.  Passive flexion. Vaso:  Lt LE elevated on wedge with medium compression for 10 minutes at 34deg     DATE: 10/18/2023 Therex: UBE UE/LE for knee ROM seat 5 partial circles 5 second stretching to  full revolutions 10 mins  (5 mins of each) Seated Lt knee LAQ 7 lbs 2 x 15 , performed bilaterally  Seated isometric Lt leg extension 5 sec hold x 10 with contralateral leg pushing back( cues for home use) Supine Lt knee flexion/extension end range stretching 5 sec holds x 10 in 90 deg hip flexion Supine bridge 2 x 5 with heel slide walk up after each set for flexion gains.  Incline gastroc stretch 30 sec x 3 bilateral   Manual Seated Lt knee flexion c distraction/IR mobilization c movement.  Contract relax for extension and flexion gains.    TREATMENT       DATE: 09/30/23  Therex: Recumbent bike, level 1, seat 2 for 9 minutes Glute bridge MWM  Neuro Re-Ed (for muscle activation) Quad set: opposite leg, Lt LE, both LE, 3x each for 3 sets  Bilateral SAQ, 2x8  Manual Grade 3/4 mobs for knee extension  Contract, relax technique for knee flexion   Vaso:  Lt LE elevated on wedge with medium compression for 10 minutes at 34deg  TREATMENT       DATE: 09/29/23 Therex: UBE UE/LE for knee ROM to tolerance partial circles mainly with full mixed in around 6 mins in - 8 mins total , seat 5 Supine knee extension with heel prop and 4# weight pulling knee into extension 3 min SLR 20x Seated Lt leg LAQ 3 x 10 for ROM  TherActivity: Mini squats with UE support  Sit to stands  Manual: Passive extension  PATIENT EDUCATION:  Education details: exam findings, POC, course of care following TKR  Person educated: Patient Education method: Explanation and Demonstration Education comprehension: verbalized understanding, returned demonstration, and needs further education  HOME EXERCISE PROGRAM: Access Code: [email or web address] URL: [email or web address] Date: 09/22/2023 Prepared by: Ismael Theophilus Stallion Access Code: [email or web address] URL: [email or web address] Date: 09/26/2023 Prepared by: Burnard Meth  Exercises - Supine Heel Slide with Strap  - 5 x daily - 7 x weekly - 1  sets - 10 reps - 10 seconds hold - Supine Knee Extension Stretch on Towel Roll  - 1 x daily - 7 x weekly - 3 sets - 10 reps - Seated Knee Flexion Extension AROM   - 1 x daily - 7 x weekly - 3 sets - 10 reps - Seated Knee Flexion AAROM  - 1 x daily - 7 x weekly - 3 sets - 10 reps - Supine Short Arc Quad  - 1 x daily - 7 x weekly - 3 sets - 10 reps - Standing Terminal Knee Extension with Resistance  - 1 x daily - 7 x weekly - 2-3 sets - 10 reps - 5 seconds hold - Mini Squat with Counter Support  - 2 x daily - 7 x weekly - 2 sets - 10 reps - 5 hold  ASSESSMENT:  CLINICAL IMPRESSION:   Pt demos increased flexion 5 degrees,  after exercises  and manual.   OBJECTIVE IMPAIRMENTS: Abnormal gait, decreased activity tolerance, decreased balance, decreased knowledge of use of DME, decreased mobility, difficulty walking, decreased ROM, decreased strength, hypomobility, increased muscle spasms, impaired flexibility, and pain.   ACTIVITY LIMITATIONS: sitting, standing, squatting, sleeping, stairs, transfers, bed mobility, and locomotion level  PARTICIPATION LIMITATIONS: driving, shopping, community activity, yard work, and church  PERSONAL FACTORS: Age, Behavior pattern, Education, Fitness, Past/current experiences, Social background, and Time since onset of injury/illness/exacerbation are also affecting patient's functional outcome.   REHAB POTENTIAL: Good  CLINICAL DECISION MAKING: Stable/uncomplicated  EVALUATION COMPLEXITY: Low   GOALS: Goals reviewed with patient? No  SHORT TERM GOALS: Target date: 10/07/2023   Will be compliant with appropriate progressive HEP Goal status: Met  2. Lt knee ROM 5-85* Goal status: partially met  3. Will be independent with edema management strategies  Goal status: mostly met   LONG TERM GOALS: Target date: 11/11/2023   Patient will demonstrate Lt knee MMT WFL throughout to faciltiate usual transfers, stairs, squatting at PLOF for daily life.  Goal  status: updated, 09/28/2023  2. L Knee flexion AROM -5 to 90* Goal status: updated, 09/28/2023  3. Will be able to ascend and descend stairs reciprocally with no increase in pain Goal status: updated, 09/28/2023  4. Will be able to ambulate community distances and perform all household tasks with no increase in pain w/ LRAD  Goal status: updated, 09/28/2023  5.  Patient will demonstrate Patient specific functional scale >5 to indicate reduced disability due to condition.  Goal status: updated, 09/28/2023  6. Lt Knee flexion PROM -2 to 100* Goal status: updated, 09/28/2023   PLAN:  PT FREQUENCY:  4-5x/week for 2 weeks then 2-3x/week for 6 weeks  PT DURATION: 8 weeks  PLANNED INTERVENTIONS: 97750- Physical Performance Testing, 97110-Therapeutic exercises, 97530- Therapeutic activity, 97112- Neuromuscular re-education, 97535- Self Care, [id number]- Manual therapy, 97116- Gait training, and 97016- Vasopneumatic device  PLAN FOR NEXT SESSION: Needs continued strengthening (OKC, CKC ) while working on motion gains.    Burnard Meth, PT 10/26/23  12:58 PM

## 2023-10-26 ENCOUNTER — Ambulatory Visit

## 2023-10-26 DIAGNOSIS — R600 Localized edema: Secondary | ICD-10-CM

## 2023-10-26 DIAGNOSIS — R2689 Other abnormalities of gait and mobility: Secondary | ICD-10-CM | POA: Diagnosis not present

## 2023-10-26 DIAGNOSIS — M25662 Stiffness of left knee, not elsewhere classified: Secondary | ICD-10-CM

## 2023-10-26 DIAGNOSIS — G8929 Other chronic pain: Secondary | ICD-10-CM

## 2023-10-26 DIAGNOSIS — M6281 Muscle weakness (generalized): Secondary | ICD-10-CM

## 2023-10-26 DIAGNOSIS — M25562 Pain in left knee: Secondary | ICD-10-CM | POA: Diagnosis not present

## 2023-10-26 DIAGNOSIS — R262 Difficulty in walking, not elsewhere classified: Secondary | ICD-10-CM | POA: Diagnosis not present

## 2023-10-27 ENCOUNTER — Other Ambulatory Visit (HOSPITAL_BASED_OUTPATIENT_CLINIC_OR_DEPARTMENT_OTHER)

## 2023-10-27 ENCOUNTER — Other Ambulatory Visit: Payer: Medicare Other

## 2023-10-28 ENCOUNTER — Ambulatory Visit: Payer: Self-pay | Admitting: Gastroenterology

## 2023-10-28 NOTE — Therapy (Signed)
 OUTPATIENT PHYSICAL THERAPY TREATMENT  Patient Name: Patricia Hoover MRN: [id number] DOB:1945/07/15, 78 y.o., female Today's Date: 10/31/2023     END OF SESSION:  PT End of Session - 10/31/23 1229     Visit Number 24    Number of Visits 30    Date for Recertification  11/11/23    Authorization Type MCR    Authorization Time Period 08/12/23 to 10/07/23    PT Start Time 1100    PT Stop Time 1148    PT Time Calculation (min) 48 min    Activity Tolerance Patient tolerated treatment well    Behavior During Therapy [facility] for tasks assessed/performed                Past Medical History:  Diagnosis Date   Angioedema 06/15/2017   Arrhythmia    possible hx, resolved prev   Blood transfusion without reported diagnosis 1972   had reaction; had to stop   Carpal tunnel syndrome    Cataract 2019   resolved with surgery   Complication of anesthesia    takes a long to wake up   Duodenal ulcer    H/O exercise stress test 2012   normal   Heart murmur    as child   Hematochezia    High cholesterol    History of kidney stones    Hx of colonic polyps    Hypertension    Osteoporosis 2010   t score - 3.9   Peptic ulcer of duodenum    Renal stones    Vitamin D  deficiency 03/01/2021   Past Surgical History:  Procedure Laterality Date   ABDOMINAL HYSTERECTOMY  1972   for endometriosis   CATARACT EXTRACTION W/ INTRAOCULAR LENS IMPLANT Right 06/2017   CATARACT EXTRACTION W/ INTRAOCULAR LENS IMPLANT Left 07/2017   COLONOSCOPY Left 03/02/2017   Procedure: COLONOSCOPY;  Surgeon: Janalyn Keene NOVAK, MD;  Location: [facility] ENDOSCOPY;  Service: Endoscopy;  Laterality: Left;   COLONOSCOPY WITH PROPOFOL  N/A 05/04/2016   Procedure: COLONOSCOPY WITH PROPOFOL ;  Surgeon: Ruel Kung, MD;  Location: [facility] ENDOSCOPY;  Service: Endoscopy;  Laterality: N/A;   ESOPHAGOGASTRODUODENOSCOPY Left 03/02/2017   Procedure: ESOPHAGOGASTRODUODENOSCOPY (EGD);  Surgeon: Janalyn Keene NOVAK, MD;  Location: [facility]  ENDOSCOPY;  Service: Endoscopy;  Laterality: Left;   ESOPHAGOGASTRODUODENOSCOPY (EGD) WITH PROPOFOL  N/A 04/08/2017   Procedure: ESOPHAGOGASTRODUODENOSCOPY (EGD) WITH PROPOFOL ;  Surgeon: Janalyn Keene NOVAK, MD;  Location: [facility] ENDOSCOPY;  Service: Endoscopy;  Laterality: N/A;   EXAM UNDER ANESTHESIA WITH MANIPULATION OF KNEE Left 09/13/2023   Procedure: MANIPULATION, JOINT, KNEE, WITH ANESTHESIA;  Surgeon: Addie Cordella Hamilton, MD;  Location: [facility] OR;  Service: Orthopedics;  Laterality: Left;   LITHOTRIPSY     for kidney stone x5   LITHOTRIPSY  09/2017   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   Procedure: ARTHROPLASTY, KNEE, TOTAL, LEFT;  Surgeon: Addie Cordella Hamilton, MD;  Location: [facility] OR;  Service: Orthopedics;  Laterality: Left;   Patient Active Problem List   Diagnosis Date Noted   Arthrofibrosis of knee joint, left 09/18/2023   Arthritis of left knee 07/26/2023   Lower GI bleeding 06/29/2023   Aortic atherosclerosis 03/09/2023   Unilateral primary osteoarthritis, left knee 11/26/2022   Dysuria 04/21/2022   Stiff-legged gait 04/21/2022   Scalp lesion 09/21/2021   Clavicle enlargement 04/05/2021   Hyperpigmentation 03/01/2021   Vitamin D  deficiency 03/01/2021   Leg pain 05/21/2019   Right leg pain 05/21/2019   GI bleed 03/04/2018   Healthcare maintenance 01/08/2018   Paresthesia 01/08/2018  Neck pain 08/31/2017   Angioedema 06/15/2017   External hemorrhoids    Diverticulosis of large intestine without diverticulitis    Internal hemorrhoids    Advance care planning 12/23/2016   Renal stone 04/20/2016   Heartburn 12/17/2015   Medicare annual wellness visit, subsequent 06/05/2013   Hyperglycemia 06/05/2013   Knee pain 08/31/2012   Age-related osteoporosis without current pathological fracture 08/18/2012   HTN (hypertension) 08/09/2012   HLD (hyperlipidemia) 08/09/2012    PCP: Cleatus Molly MD   REFERRING PROVIDER: Addie Cordella Hamilton, MD  REFERRING DIAG: Diagnosis  [phone] (ICD-10-CM) - Chronic pain of left knee M17.12 (ICD-10-CM) - Unilateral primary osteoarthritis, left knee  THERAPY DIAG:  Stiffness of left knee, not elsewhere classified  Chronic pain of left knee  Difficulty in walking, not elsewhere classified  Muscle weakness (generalized)  Other abnormalities of gait and mobility  Localized edema  Unsteadiness on feet  Rationale for Evaluation and Treatment: Rehabilitation  ONSET DATE: Lt TKR 07/26/23  SUBJECTIVE:   SUBJECTIVE STATEMENT:   Pt reports some pain with the stiffness and then it goes away.   PERTINENT HISTORY: Left TKA 07/26/2023, OA, osteoporosis, heart murmur, HTN  PAIN:  NPRS scale:2/10 Pain location: Lt knee  Pain description:  stiffness, tightness.  Aggravating factors: bending it  Relieving factors: nothing besides pain pills   PRECAUTIONS: None  RED FLAGS: None   WEIGHT BEARING RESTRICTIONS: No  FALLS:  Has patient fallen in last 6 months? No  LIVING ENVIRONMENT: Lives with: lives with their family Lives in: House/apartment Stairs:  flight inside but does not have to go up  Has following equipment at home: Vannie - 2 wheeled  OCCUPATION: retiredWater engineer at an [employer]   PLOF: Independent, Independent with basic ADLs, Independent with gait, and Independent with transfers  PATIENT GOALS: be able to line dance   NEXT MD VISIT: Referring 09/23/2023  OBJECTIVE:  Note: Objective measures were completed at Evaluation unless otherwise noted.  PATIENT SURVEYS:  PSFS: THE PATIENT SPECIFIC FUNCTIONAL SCALE  Place score of 0-10 (0 = unable to perform activity and 10 = able to perform activity at the same level as before injury or problem)  Activity Date: 08/12/23 09/14/23 09/28/2023    Bending knee  5 1 5   2. Lifting leg  5 1 6   3.walking  2 8  4. Standing for ADLs  2 6  Total Score 5 1.5 6.25    Total Score = Sum of activity scores/number of activities  Minimally Detectable  Change: 3 points (for single activity); 2 points (for average score)   COGNITION: 08/12/2023 Overall cognitive status: Within functional limits for tasks assessed    3 SENSATION: 08/12/2023 [facility]  EDEMA:  08/12/2023 Localized edema appropriate for post-op state    LOWER EXTREMITY ROM:  Active ROM Left eval Left  08/22/23 Left  08/24/23 Left 09/01/23 Left  09/14/23 Left 09/20/23 Left 09/28/2023 Left 10/18/2023 Left  10/20/23 Left  10/26/23  Hip flexion            Hip extension            Hip abduction            Hip adduction            Hip internal rotation            Hip external rotation            Knee flexion 67 64* 73* 62 in supine AROM heel slide  72  in PROM heel slide Supine 90/90 position A: 68* Supine A: 60 P: 65 Sitting:  P: 70 Supine AROM heel slide: 71  Supine PROM: 74 Supine AROM heel slide: 82  Supine passive range heel slide: 86   Supine  A 80 Supine  A 85 P 92  Knee extension -9 -5* -5* -8 in seated AROM LAQ  Supine -9* Supine P: -5 Seated -6 AROM in heel prop     Ankle dorsiflexion            Ankle plantarflexion            Ankle inversion            Ankle eversion             (Blank rows = not tested)  LOWER EXTREMITY MMT:  MMT Left Eval 08/12/2023 Left 09/14/23 Left 09/28/2023 Right 10/18/2023 Left 10/18/2023  Hip flexion   5/5    Hip extension       Hip abduction       Hip adduction       Hip internal rotation       Hip external rotation       Knee flexion 3+ 3-/5 4+/5    Knee extension 3+ 3-/5 4/5 5/5 37, 37.3 lbs 4/5 22, 20.6 lbs  Ankle dorsiflexion   5/5    Ankle plantarflexion       Ankle inversion       Ankle eversion        (Blank rows = not tested)    GAIT: 09/28/2023: Ambulation with SPC in Rt UE with some quality improvements in knee flexion in swing.  Lacking TKE still noted in stance.   08/30/2023: Ambulation in clinic c SPC in Rt UE with SBA.  Reduced knee extension in stance, maintaining knee flexion.    08/12/2023 Distance walked: in clinic distances  Assistive device utilized: Environmental consultant - 2 wheeled Level of assistance: Modified independence Comments: antalgic, limited knee ROM during gait cycle                  TREATMENT  L knee      10/31/23 Therex: Sci Fit UE/LE for knee ROM seat 5 then 4   (5 mins of each) Seated Lt knee LAQ 7 lbs 2 x 15 , performed bilaterally  Incline gastroc stretch 30 sec x 3 bilateral  Squats 2x10   There act for transfers and stairs Leg press 75# 3x10  L LE #50 3x10 Step ups 6 step Lean ins on 8 step. Lateral lifts 10x  Manual Supine Lt knee extension Vaso:  Lt LE elevated on wedge with medium compression for 10 minutes at 34deg       10/26/23 Therex: Nustep level 3 7 min Seated Lt knee LAQ 7 lbs 2 x 15 , performed bilaterally  Supine bridge 2 x 5 with heel slide walk up after each set for flexion gains.  Incline gastroc stretch 30 sec x 3 bilateral  Squats 2x10   There act for transfers and stairs Leg press 75# 3x10  L LE #50 3x10 Step ups 6 step Lean ins on 6 step. Lateral lifts  Manual Seated Lt knee flexion c distraction/IR mobilization c movement.  Passive flexion. Vaso:  Lt LE elevated on wedge with medium compression for 10 minutes at 34deg   10/24/23 Therex: UBE UE/LE for knee ROM seat 5 then 4   (5 mins of each) Seated Lt knee LAQ 7 lbs 2 x 15 ,  performed bilaterally  Supine bridge 2 x 5 with heel slide walk up after each set for flexion gains.  Incline gastroc stretch 30 sec x 3 bilateral  Squats 2x10  Incline board stretch There act for transfers and stairs Step ups 6 step Lean ins on 6 step. Lateral lifts  Manual Seated Lt knee flexion c distraction/IR mobilization c movement.  Passive flexion. Vaso:  Lt LE elevated on wedge with medium compression for 10 minutes at 34deg     10/20/23 Therex: UBE UE/LE for knee ROM seat 5 partial circles 5 second stretching to full revolutions 10 mins  (5 mins of  each) Seated Lt knee LAQ 7 lbs 2 x 15 , performed bilaterally  Supine bridge 2 x 5 with heel slide walk up after each set for flexion gains.  Incline gastroc stretch 30 sec x 3 bilateral  Squats 2x10  Incline board stretch Manual Seated Lt knee flexion c distraction/IR mobilization c movement.  Passive flexion. Vaso:  Lt LE elevated on wedge with medium compression for 10 minutes at 34deg     DATE: 10/18/2023 Therex: UBE UE/LE for knee ROM seat 5 partial circles 5 second stretching to full revolutions 10 mins  (5 mins of each) Seated Lt knee LAQ 7 lbs 2 x 15 , performed bilaterally  Seated isometric Lt leg extension 5 sec hold x 10 with contralateral leg pushing back( cues for home use) Supine Lt knee flexion/extension end range stretching 5 sec holds x 10 in 90 deg hip flexion Supine bridge 2 x 5 with heel slide walk up after each set for flexion gains.  Incline gastroc stretch 30 sec x 3 bilateral   Manual Seated Lt knee flexion c distraction/IR mobilization c movement.  Contract relax for extension and flexion gains.    TREATMENT       DATE: 09/30/23  Therex: Recumbent bike, level 1, seat 2 for 9 minutes Glute bridge MWM  Neuro Re-Ed (for muscle activation) Quad set: opposite leg, Lt LE, both LE, 3x each for 3 sets  Bilateral SAQ, 2x8  Manual Grade 3/4 mobs for knee extension  Contract, relax technique for knee flexion   Vaso:  Lt LE elevated on wedge with medium compression for 10 minutes at 34deg  TREATMENT       DATE: 09/29/23 Therex: UBE UE/LE for knee ROM to tolerance partial circles mainly with full mixed in around 6 mins in - 8 mins total , seat 5 Supine knee extension with heel prop and 4# weight pulling knee into extension 3 min SLR 20x Seated Lt leg LAQ 3 x 10 for ROM  TherActivity: Mini squats with UE support  Sit to stands  Manual: Passive extension  PATIENT EDUCATION:  Education details: exam findings, POC, course of care following TKR  Person  educated: Patient Education method: Explanation and Demonstration Education comprehension: verbalized understanding, returned demonstration, and needs further education  HOME EXERCISE PROGRAM: Access Code: [email or web address] URL: [email or web address] Date: 09/22/2023 Prepared by: Ismael Theophilus Stallion Access Code: [email or web address] URL: [email or web address] Date: 09/26/2023 Prepared by: Burnard Meth  Exercises - Supine Heel Slide with Strap  - 5 x daily - 7 x weekly - 1 sets - 10 reps - 10 seconds hold - Supine Knee Extension Stretch on Towel Roll  - 1 x daily - 7 x weekly - 3 sets - 10 reps - Seated Knee Flexion Extension AROM   - 1 x daily - 7 x weekly - 3 sets - 10 reps - Seated Knee  Flexion AAROM  - 1 x daily - 7 x weekly - 3 sets - 10 reps - Supine Short Arc Quad  - 1 x daily - 7 x weekly - 3 sets - 10 reps - Standing Terminal Knee Extension with Resistance  - 1 x daily - 7 x weekly - 2-3 sets - 10 reps - 5 seconds hold - Mini Squat with Counter Support  - 2 x daily - 7 x weekly - 2 sets - 10 reps - 5 hold  ASSESSMENT:  CLINICAL IMPRESSION: Pt still with -5 degrees of full extension.  No pain.  Needed VC or quad sets.  Demonstrated understanding.    OBJECTIVE IMPAIRMENTS: Abnormal gait, decreased activity tolerance, decreased balance, decreased knowledge of use of DME, decreased mobility, difficulty walking, decreased ROM, decreased strength, hypomobility, increased muscle spasms, impaired flexibility, and pain.   ACTIVITY LIMITATIONS: sitting, standing, squatting, sleeping, stairs, transfers, bed mobility, and locomotion level  PARTICIPATION LIMITATIONS: driving, shopping, community activity, yard work, and church  PERSONAL FACTORS: Age, Behavior pattern, Education, Fitness, Past/current experiences, Social background, and Time since onset of injury/illness/exacerbation are also affecting patient's functional outcome.   REHAB POTENTIAL: Good  CLINICAL  DECISION MAKING: Stable/uncomplicated  EVALUATION COMPLEXITY: Low   GOALS: Goals reviewed with patient? No  SHORT TERM GOALS: Target date: 10/07/2023   Will be compliant with appropriate progressive HEP Goal status: Met  2. Lt knee ROM 5-85* Goal status: partially met  3. Will be independent with edema management strategies  Goal status: mostly met   LONG TERM GOALS: Target date: 11/11/2023   Patient will demonstrate Lt knee MMT WFL throughout to faciltiate usual transfers, stairs, squatting at PLOF for daily life.  Goal status: updated, 09/28/2023  2. L Knee flexion AROM -5 to 90* Goal status: updated, 09/28/2023  3. Will be able to ascend and descend stairs reciprocally with no increase in pain Goal status: updated, 09/28/2023  4. Will be able to ambulate community distances and perform all household tasks with no increase in pain w/ LRAD  Goal status: updated, 09/28/2023  5.  Patient will demonstrate Patient specific functional scale >5 to indicate reduced disability due to condition.  Goal status: updated, 09/28/2023  6. Lt Knee flexion PROM -2 to 100* Goal status: updated, 09/28/2023   PLAN:  PT FREQUENCY:  4-5x/week for 2 weeks then 2-3x/week for 6 weeks  PT DURATION: 8 weeks  PLANNED INTERVENTIONS: 97750- Physical Performance Testing, 97110-Therapeutic exercises, 97530- Therapeutic activity, 97112- Neuromuscular re-education, 97535- Self Care, [id number]- Manual therapy, [id number]- Gait training, and 97016- Vasopneumatic device  PLAN FOR NEXT SESSION: Needs continued strengthening (OKC, CKC ) while working on motion gains.    Burnard Meth, PT 10/31/23  12:31 PM

## 2023-10-31 ENCOUNTER — Ambulatory Visit (INDEPENDENT_AMBULATORY_CARE_PROVIDER_SITE_OTHER)

## 2023-10-31 DIAGNOSIS — R262 Difficulty in walking, not elsewhere classified: Secondary | ICD-10-CM | POA: Diagnosis not present

## 2023-10-31 DIAGNOSIS — M25562 Pain in left knee: Secondary | ICD-10-CM | POA: Diagnosis not present

## 2023-10-31 DIAGNOSIS — R600 Localized edema: Secondary | ICD-10-CM | POA: Diagnosis not present

## 2023-10-31 DIAGNOSIS — M25662 Stiffness of left knee, not elsewhere classified: Secondary | ICD-10-CM

## 2023-10-31 DIAGNOSIS — G8929 Other chronic pain: Secondary | ICD-10-CM

## 2023-10-31 DIAGNOSIS — R2689 Other abnormalities of gait and mobility: Secondary | ICD-10-CM

## 2023-10-31 DIAGNOSIS — M6281 Muscle weakness (generalized): Secondary | ICD-10-CM | POA: Diagnosis not present

## 2023-10-31 DIAGNOSIS — R2681 Unsteadiness on feet: Secondary | ICD-10-CM

## 2023-11-02 ENCOUNTER — Ambulatory Visit (INDEPENDENT_AMBULATORY_CARE_PROVIDER_SITE_OTHER)

## 2023-11-02 DIAGNOSIS — G8929 Other chronic pain: Secondary | ICD-10-CM

## 2023-11-02 DIAGNOSIS — R2689 Other abnormalities of gait and mobility: Secondary | ICD-10-CM | POA: Diagnosis not present

## 2023-11-02 DIAGNOSIS — R600 Localized edema: Secondary | ICD-10-CM

## 2023-11-02 DIAGNOSIS — M25562 Pain in left knee: Secondary | ICD-10-CM | POA: Diagnosis not present

## 2023-11-02 DIAGNOSIS — M25662 Stiffness of left knee, not elsewhere classified: Secondary | ICD-10-CM | POA: Diagnosis not present

## 2023-11-02 DIAGNOSIS — R262 Difficulty in walking, not elsewhere classified: Secondary | ICD-10-CM

## 2023-11-02 DIAGNOSIS — R2681 Unsteadiness on feet: Secondary | ICD-10-CM | POA: Diagnosis not present

## 2023-11-02 DIAGNOSIS — M6281 Muscle weakness (generalized): Secondary | ICD-10-CM | POA: Diagnosis not present

## 2023-11-02 NOTE — Therapy (Signed)
 OUTPATIENT PHYSICAL THERAPY TREATMENT  Patient Name: Patricia Hoover MRN: [id number] DOB:1945-01-29, 78 y.o., female Today's Date: 11/02/2023     END OF SESSION:  PT End of Session - 11/02/23 1150     Visit Number 25    Number of Visits 30    Date for Recertification  11/11/23    Authorization Type MCR    Authorization Time Period 08/12/23 to 10/07/23    PT Start Time 1100    PT Stop Time 1203    PT Time Calculation (min) 63 min    Activity Tolerance Patient tolerated treatment well    Behavior During Therapy [facility] for tasks assessed/performed                 Past Medical History:  Diagnosis Date   Angioedema 06/15/2017   Arrhythmia    possible hx, resolved prev   Blood transfusion without reported diagnosis 1972   had reaction; had to stop   Carpal tunnel syndrome    Cataract 2019   resolved with surgery   Complication of anesthesia    takes a long to wake up   Duodenal ulcer    H/O exercise stress test 2012   normal   Heart murmur    as child   Hematochezia    High cholesterol    History of kidney stones    Hx of colonic polyps    Hypertension    Osteoporosis 2010   t score - 3.9   Peptic ulcer of duodenum    Renal stones    Vitamin D  deficiency 03/01/2021   Past Surgical History:  Procedure Laterality Date   ABDOMINAL HYSTERECTOMY  1972   for endometriosis   CATARACT EXTRACTION W/ INTRAOCULAR LENS IMPLANT Right 06/2017   CATARACT EXTRACTION W/ INTRAOCULAR LENS IMPLANT Left 07/2017   COLONOSCOPY Left 03/02/2017   Procedure: COLONOSCOPY;  Surgeon: Janalyn Keene NOVAK, MD;  Location: [facility] ENDOSCOPY;  Service: Endoscopy;  Laterality: Left;   COLONOSCOPY WITH PROPOFOL  N/A 05/04/2016   Procedure: COLONOSCOPY WITH PROPOFOL ;  Surgeon: Ruel Kung, MD;  Location: [facility] ENDOSCOPY;  Service: Endoscopy;  Laterality: N/A;   ESOPHAGOGASTRODUODENOSCOPY Left 03/02/2017   Procedure: ESOPHAGOGASTRODUODENOSCOPY (EGD);  Surgeon: Janalyn Keene NOVAK, MD;  Location: [facility]  ENDOSCOPY;  Service: Endoscopy;  Laterality: Left;   ESOPHAGOGASTRODUODENOSCOPY (EGD) WITH PROPOFOL  N/A 04/08/2017   Procedure: ESOPHAGOGASTRODUODENOSCOPY (EGD) WITH PROPOFOL ;  Surgeon: Janalyn Keene NOVAK, MD;  Location: [facility] ENDOSCOPY;  Service: Endoscopy;  Laterality: N/A;   EXAM UNDER ANESTHESIA WITH MANIPULATION OF KNEE Left 09/13/2023   Procedure: MANIPULATION, JOINT, KNEE, WITH ANESTHESIA;  Surgeon: Addie Cordella Hamilton, MD;  Location: [facility] OR;  Service: Orthopedics;  Laterality: Left;   LITHOTRIPSY     for kidney stone x5   LITHOTRIPSY  09/2017   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   Procedure: ARTHROPLASTY, KNEE, TOTAL, LEFT;  Surgeon: Addie Cordella Hamilton, MD;  Location: [facility] OR;  Service: Orthopedics;  Laterality: Left;   Patient Active Problem List   Diagnosis Date Noted   Arthrofibrosis of knee joint, left 09/18/2023   Arthritis of left knee 07/26/2023   Lower GI bleeding 06/29/2023   Aortic atherosclerosis 03/09/2023   Unilateral primary osteoarthritis, left knee 11/26/2022   Dysuria 04/21/2022   Stiff-legged gait 04/21/2022   Scalp lesion 09/21/2021   Clavicle enlargement 04/05/2021   Hyperpigmentation 03/01/2021   Vitamin D  deficiency 03/01/2021   Leg pain 05/21/2019   Right leg pain 05/21/2019   GI bleed 03/04/2018   Healthcare maintenance 01/08/2018   Paresthesia  01/08/2018   Neck pain 08/31/2017   Angioedema 06/15/2017   External hemorrhoids    Diverticulosis of large intestine without diverticulitis    Internal hemorrhoids    Advance care planning 12/23/2016   Renal stone 04/20/2016   Heartburn 12/17/2015   Medicare annual wellness visit, subsequent 06/05/2013   Hyperglycemia 06/05/2013   Knee pain 08/31/2012   Age-related osteoporosis without current pathological fracture 08/18/2012   HTN (hypertension) 08/09/2012   HLD (hyperlipidemia) 08/09/2012    PCP: Cleatus Molly MD   REFERRING PROVIDER: Addie Cordella Hamilton, MD  REFERRING DIAG: Diagnosis  [phone] (ICD-10-CM) - Chronic pain of left knee M17.12 (ICD-10-CM) - Unilateral primary osteoarthritis, left knee  THERAPY DIAG:  Stiffness of left knee, not elsewhere classified  Chronic pain of left knee  Difficulty in walking, not elsewhere classified  Muscle weakness (generalized)  Other abnormalities of gait and mobility  Localized edema  Unsteadiness on feet  Rationale for Evaluation and Treatment: Rehabilitation  ONSET DATE: Lt TKR 07/26/23  SUBJECTIVE:   SUBJECTIVE STATEMENT:   Pt reports still stiff in/out of a car.   PERTINENT HISTORY: Left TKA 07/26/2023, OA, osteoporosis, heart murmur, HTN  PAIN:  NPRS scale:2/10 Pain location: Lt knee  Pain description:  stiffness, tightness.  Aggravating factors: bending it  Relieving factors: nothing besides pain pills   PRECAUTIONS: None  RED FLAGS: None   WEIGHT BEARING RESTRICTIONS: No  FALLS:  Has patient fallen in last 6 months? No  LIVING ENVIRONMENT: Lives with: lives with their family Lives in: House/apartment Stairs:  flight inside but does not have to go up  Has following equipment at home: Vannie - 2 wheeled  OCCUPATION: retiredWater engineer at an [employer]   PLOF: Independent, Independent with basic ADLs, Independent with gait, and Independent with transfers  PATIENT GOALS: be able to line dance   NEXT MD VISIT: Referring 09/23/2023  OBJECTIVE:  Note: Objective measures were completed at Evaluation unless otherwise noted.  PATIENT SURVEYS:  PSFS: THE PATIENT SPECIFIC FUNCTIONAL SCALE  Place score of 0-10 (0 = unable to perform activity and 10 = able to perform activity at the same level as before injury or problem)  Activity Date: 08/12/23 09/14/23 09/28/2023    Bending knee  5 1 5   2. Lifting leg  5 1 6   3.walking  2 8  4. Standing for ADLs  2 6  Total Score 5 1.5 6.25    Total Score = Sum of activity scores/number of activities  Minimally Detectable Change: 3 points (for  single activity); 2 points (for average score)   COGNITION: 08/12/2023 Overall cognitive status: Within functional limits for tasks assessed    3 SENSATION: 08/12/2023 [facility]  EDEMA:  08/12/2023 Localized edema appropriate for post-op state    LOWER EXTREMITY ROM:  Active ROM Left eval Left  08/22/23 Left  08/24/23 Left 09/01/23 Left  09/14/23 Left 09/20/23 Left 09/28/2023 Left 10/18/2023 Left  10/20/23 Left  10/26/23  Hip flexion            Hip extension            Hip abduction            Hip adduction            Hip internal rotation            Hip external rotation            Knee flexion 67 64* 73* 62 in supine AROM heel slide  72 in  PROM heel slide Supine 90/90 position A: 68* Supine A: 60 P: 65 Sitting:  P: 70 Supine AROM heel slide: 71  Supine PROM: 74 Supine AROM heel slide: 82  Supine passive range heel slide: 86   Supine  A 80 Supine  A 85 P 92  Knee extension -9 -5* -5* -8 in seated AROM LAQ  Supine -9* Supine P: -5 Seated -6 AROM in heel prop     Ankle dorsiflexion            Ankle plantarflexion            Ankle inversion            Ankle eversion             (Blank rows = not tested)  LOWER EXTREMITY MMT:  MMT Left Eval 08/12/2023 Left 09/14/23 Left 09/28/2023 Right 10/18/2023 Left 10/18/2023  Hip flexion   5/5    Hip extension       Hip abduction       Hip adduction       Hip internal rotation       Hip external rotation       Knee flexion 3+ 3-/5 4+/5    Knee extension 3+ 3-/5 4/5 5/5 37, 37.3 lbs 4/5 22, 20.6 lbs  Ankle dorsiflexion   5/5    Ankle plantarflexion       Ankle inversion       Ankle eversion        (Blank rows = not tested)    GAIT: 09/28/2023: Ambulation with SPC in Rt UE with some quality improvements in knee flexion in swing.  Lacking TKE still noted in stance.   08/30/2023: Ambulation in clinic c SPC in Rt UE with SBA.  Reduced knee extension in stance, maintaining knee flexion.   08/12/2023 Distance  walked: in clinic distances  Assistive device utilized: Environmental consultant - 2 wheeled Level of assistance: Modified independence Comments: antalgic, limited knee ROM during gait cycle                  TREATMENT  L knee      11/02/23 Therex: Sci Fit UE/LE for knee ROM seat 5 8 min Seated Lt knee LAQ 7 lbs 2 x 15 , performed bilaterally  Incline gastroc stretch 30 sec x 3 bilateral  Squats 2x10  Prone lie for extension 3 min  There act for transfers and stairs Leg press 81# 3x10  L LE #50 3x10 Step ups 6 step Lean ins on 6 step. Lateral lifts 10x Neuro  Backward weight transfer for ext  2x10 Tandem stance 325 sec 2x each foot Vaso:  Lt LE elevated on wedge with medium compression for 10 minutes at 34deg    10/31/23 Therex: Sci Fit UE/LE for knee ROM seat 5 then 4   (5 mins of each) Seated Lt knee LAQ 7 lbs 2 x 15 , performed bilaterally  Incline gastroc stretch 30 sec x 3 bilateral  Squats 2x10   There act for transfers and stairs Leg press 75# 3x10  L LE #50 3x10 Step ups 6 step Lean ins on 8 step. Lateral lifts 10x  Manual Supine Lt knee extension Vaso:  Lt LE elevated on wedge with medium compression for 10 minutes at 34deg       10/26/23 Therex: Nustep level 3 7 min Seated Lt knee LAQ 7 lbs 2 x 15 , performed bilaterally  Supine bridge 2 x 5 with heel slide walk  up after each set for flexion gains.  Incline gastroc stretch 30 sec x 3 bilateral  Squats 2x10   There act for transfers and stairs Leg press 75# 3x10  L LE #50 3x10 Step ups 6 step Lean ins on 6 step. Lateral lifts  Manual Seated Lt knee flexion c distraction/IR mobilization c movement.  Passive flexion. Vaso:  Lt LE elevated on wedge with medium compression for 10 minutes at 34deg   10/24/23 Therex: UBE UE/LE for knee ROM seat 5 then 4   (5 mins of each) Seated Lt knee LAQ 7 lbs 2 x 15 , performed bilaterally  Supine bridge 2 x 5 with heel slide walk up after each set for flexion gains.   Incline gastroc stretch 30 sec x 3 bilateral  Squats 2x10  Incline board stretch There act for transfers and stairs Step ups 6 step Lean ins on 6 step. Lateral lifts  Manual Seated Lt knee flexion c distraction/IR mobilization c movement.  Passive flexion. Vaso:  Lt LE elevated on wedge with medium compression for 10 minutes at 34deg     10/20/23 Therex: UBE UE/LE for knee ROM seat 5 partial circles 5 second stretching to full revolutions 10 mins  (5 mins of each) Seated Lt knee LAQ 7 lbs 2 x 15 , performed bilaterally  Supine bridge 2 x 5 with heel slide walk up after each set for flexion gains.  Incline gastroc stretch 30 sec x 3 bilateral  Squats 2x10  Incline board stretch Manual Seated Lt knee flexion c distraction/IR mobilization c movement.  Passive flexion. Vaso:  Lt LE elevated on wedge with medium compression for 10 minutes at 34deg     DATE: 10/18/2023 Therex: UBE UE/LE for knee ROM seat 5 partial circles 5 second stretching to full revolutions 10 mins  (5 mins of each) Seated Lt knee LAQ 7 lbs 2 x 15 , performed bilaterally  Seated isometric Lt leg extension 5 sec hold x 10 with contralateral leg pushing back( cues for home use) Supine Lt knee flexion/extension end range stretching 5 sec holds x 10 in 90 deg hip flexion Supine bridge 2 x 5 with heel slide walk up after each set for flexion gains.  Incline gastroc stretch 30 sec x 3 bilateral   Manual Seated Lt knee flexion c distraction/IR mobilization c movement.  Contract relax for extension and flexion gains.    TREATMENT       DATE: 09/30/23  Therex: Recumbent bike, level 1, seat 2 for 9 minutes Glute bridge MWM  Neuro Re-Ed (for muscle activation) Quad set: opposite leg, Lt LE, both LE, 3x each for 3 sets  Bilateral SAQ, 2x8  Manual Grade 3/4 mobs for knee extension  Contract, relax technique for knee flexion   Vaso:  Lt LE elevated on wedge with medium compression for 10 minutes at  34deg  TREATMENT       DATE: 09/29/23 Therex: UBE UE/LE for knee ROM to tolerance partial circles mainly with full mixed in around 6 mins in - 8 mins total , seat 5 Supine knee extension with heel prop and 4# weight pulling knee into extension 3 min SLR 20x Seated Lt leg LAQ 3 x 10 for ROM  TherActivity: Mini squats with UE support  Sit to stands  Manual: Passive extension  PATIENT EDUCATION:  Education details: exam findings, POC, course of care following TKR  Person educated: Patient Education method: Medical illustrator Education comprehension: verbalized understanding, returned demonstration, and  needs further education  HOME EXERCISE PROGRAM: Access Code: [email or web address] URL: [email or web address] Date: 09/22/2023 Prepared by: Ismael Theophilus Stallion Access Code: [email or web address] URL: [email or web address] Date: 09/26/2023 Prepared by: Burnard Meth  Exercises - Supine Heel Slide with Strap  - 5 x daily - 7 x weekly - 1 sets - 10 reps - 10 seconds hold - Supine Knee Extension Stretch on Towel Roll  - 1 x daily - 7 x weekly - 3 sets - 10 reps - Seated Knee Flexion Extension AROM   - 1 x daily - 7 x weekly - 3 sets - 10 reps - Seated Knee Flexion AAROM  - 1 x daily - 7 x weekly - 3 sets - 10 reps - Supine Short Arc Quad  - 1 x daily - 7 x weekly - 3 sets - 10 reps - Standing Terminal Knee Extension with Resistance  - 1 x daily - 7 x weekly - 2-3 sets - 10 reps - 5 seconds hold - Mini Squat with Counter Support  - 2 x daily - 7 x weekly - 2 sets - 10 reps - 5 hold  ASSESSMENT:  CLINICAL IMPRESSION: VC needed to emphasize extension with backward ambulation/weight shift.  OBJECTIVE IMPAIRMENTS: Abnormal gait, decreased activity tolerance, decreased balance, decreased knowledge of use of DME, decreased mobility, difficulty walking, decreased ROM, decreased strength, hypomobility, increased muscle spasms, impaired flexibility, and pain.   ACTIVITY  LIMITATIONS: sitting, standing, squatting, sleeping, stairs, transfers, bed mobility, and locomotion level  PARTICIPATION LIMITATIONS: driving, shopping, community activity, yard work, and church  PERSONAL FACTORS: Age, Behavior pattern, Education, Fitness, Past/current experiences, Social background, and Time since onset of injury/illness/exacerbation are also affecting patient's functional outcome.   REHAB POTENTIAL: Good  CLINICAL DECISION MAKING: Stable/uncomplicated  EVALUATION COMPLEXITY: Low   GOALS: Goals reviewed with patient? No  SHORT TERM GOALS: Target date: 10/07/2023   Will be compliant with appropriate progressive HEP Goal status: Met  2. Lt knee ROM 5-85* Goal status: partially met  3. Will be independent with edema management strategies  Goal status: mostly met   LONG TERM GOALS: Target date: 11/11/2023   Patient will demonstrate Lt knee MMT WFL throughout to faciltiate usual transfers, stairs, squatting at PLOF for daily life.  Goal status: updated, 09/28/2023  2. L Knee flexion AROM -5 to 90* Goal status: updated, 09/28/2023  3. Will be able to ascend and descend stairs reciprocally with no increase in pain Goal status: updated, 09/28/2023  4. Will be able to ambulate community distances and perform all household tasks with no increase in pain w/ LRAD  Goal status: updated, 09/28/2023  5.  Patient will demonstrate Patient specific functional scale >5 to indicate reduced disability due to condition.  Goal status: updated, 09/28/2023  6. Lt Knee flexion PROM -2 to 100* Goal status: updated, 09/28/2023   PLAN:  PT FREQUENCY:  4-5x/week for 2 weeks then 2-3x/week for 6 weeks  PT DURATION: 8 weeks  PLANNED INTERVENTIONS: 97750- Physical Performance Testing, 97110-Therapeutic exercises, 97530- Therapeutic activity, 97112- Neuromuscular re-education, 97535- Self Care, [id number]- Manual therapy, 97116- Gait training, and 97016- Vasopneumatic device  PLAN FOR  NEXT SESSION: Needs continued strengthening (OKC, CKC ) while working on motion gains.    Burnard Meth, PT 11/02/23  11:53 AM

## 2023-11-09 ENCOUNTER — Ambulatory Visit (INDEPENDENT_AMBULATORY_CARE_PROVIDER_SITE_OTHER): Admitting: Rehabilitative and Restorative Service Providers"

## 2023-11-09 ENCOUNTER — Encounter: Payer: Self-pay | Admitting: Rehabilitative and Restorative Service Providers"

## 2023-11-09 DIAGNOSIS — G8929 Other chronic pain: Secondary | ICD-10-CM | POA: Diagnosis not present

## 2023-11-09 DIAGNOSIS — M25562 Pain in left knee: Secondary | ICD-10-CM | POA: Diagnosis not present

## 2023-11-09 DIAGNOSIS — M6281 Muscle weakness (generalized): Secondary | ICD-10-CM

## 2023-11-09 DIAGNOSIS — R600 Localized edema: Secondary | ICD-10-CM | POA: Diagnosis not present

## 2023-11-09 DIAGNOSIS — R2689 Other abnormalities of gait and mobility: Secondary | ICD-10-CM | POA: Diagnosis not present

## 2023-11-09 DIAGNOSIS — R2681 Unsteadiness on feet: Secondary | ICD-10-CM

## 2023-11-09 DIAGNOSIS — R262 Difficulty in walking, not elsewhere classified: Secondary | ICD-10-CM | POA: Diagnosis not present

## 2023-11-09 DIAGNOSIS — M25662 Stiffness of left knee, not elsewhere classified: Secondary | ICD-10-CM | POA: Diagnosis not present

## 2023-11-09 NOTE — Therapy (Addendum)
 " OUTPATIENT PHYSICAL THERAPY TREATMENT / PROGRESS NOTE / DISCHARGE Patient Name: Patricia Hoover MRN: [id number] DOB:[birth year], 78 y.o., female Today's Date: 11/09/2023  Progress Note Reporting Period 09/28/2023 to 11/09/2023  See note below for Objective Data and Assessment of Progress/Goals.       END OF SESSION:  PT End of Session - 11/09/23 1349     Visit Number 26    Number of Visits 30    Date for Recertification  11/11/23    Authorization Type MCR    Authorization Time Period 08/12/23 to 10/07/23    PT Start Time 1336    PT Stop Time 1416    PT Time Calculation (min) 40 min    Activity Tolerance Patient tolerated treatment well    Behavior During Therapy [facility] for tasks assessed/performed                  Past Medical History:  Diagnosis Date   Angioedema 06/15/2017   Arrhythmia    possible hx, resolved prev   Blood transfusion without reported diagnosis 1972   had reaction; had to stop   Carpal tunnel syndrome    Cataract 2019   resolved with surgery   Complication of anesthesia    takes a long to wake up   Duodenal ulcer    H/O exercise stress test 2012   normal   Heart murmur    as child   Hematochezia    High cholesterol    History of kidney stones    Hx of colonic polyps    Hypertension    Osteoporosis 2010   t score - 3.9   Peptic ulcer of duodenum    Renal stones    Vitamin D  deficiency 03/01/2021   Past Surgical History:  Procedure Laterality Date   ABDOMINAL HYSTERECTOMY  1972   for endometriosis   CATARACT EXTRACTION W/ INTRAOCULAR LENS IMPLANT Right 06/2017   CATARACT EXTRACTION W/ INTRAOCULAR LENS IMPLANT Left 07/2017   COLONOSCOPY Left 03/02/2017   Procedure: COLONOSCOPY;  Surgeon: Janalyn Keene NOVAK, MD;  Location: [facility] ENDOSCOPY;  Service: Endoscopy;  Laterality: Left;   COLONOSCOPY WITH PROPOFOL  N/A 05/04/2016   Procedure: COLONOSCOPY WITH PROPOFOL ;  Surgeon: Ruel Kung, MD;  Location: [facility] ENDOSCOPY;  Service: Endoscopy;   Laterality: N/A;   ESOPHAGOGASTRODUODENOSCOPY Left 03/02/2017   Procedure: ESOPHAGOGASTRODUODENOSCOPY (EGD);  Surgeon: Janalyn Keene NOVAK, MD;  Location: [facility] ENDOSCOPY;  Service: Endoscopy;  Laterality: Left;   ESOPHAGOGASTRODUODENOSCOPY (EGD) WITH PROPOFOL  N/A 04/08/2017   Procedure: ESOPHAGOGASTRODUODENOSCOPY (EGD) WITH PROPOFOL ;  Surgeon: Janalyn Keene NOVAK, MD;  Location: [facility] ENDOSCOPY;  Service: Endoscopy;  Laterality: N/A;   EXAM UNDER ANESTHESIA WITH MANIPULATION OF KNEE Left 09/13/2023   Procedure: MANIPULATION, JOINT, KNEE, WITH ANESTHESIA;  Surgeon: Addie Cordella Hamilton, MD;  Location: [facility] OR;  Service: Orthopedics;  Laterality: Left;   LITHOTRIPSY     for kidney stone x5   LITHOTRIPSY  09/2017   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   Procedure: ARTHROPLASTY, KNEE, TOTAL, LEFT;  Surgeon: Addie Cordella Hamilton, MD;  Location: [facility] OR;  Service: Orthopedics;  Laterality: Left;   Patient Active Problem List   Diagnosis Date Noted   Arthrofibrosis of knee joint, left 09/18/2023   Arthritis of left knee 07/26/2023   Lower GI bleeding 06/29/2023   Aortic atherosclerosis 03/09/2023   Unilateral primary osteoarthritis, left knee 11/26/2022   Dysuria 04/21/2022   Stiff-legged gait 04/21/2022   Scalp lesion 09/21/2021   Clavicle enlargement 04/05/2021   Hyperpigmentation 03/01/2021   Vitamin  D deficiency 03/01/2021   Leg pain 05/21/2019   Right leg pain 05/21/2019   GI bleed 03/04/2018   Healthcare maintenance 01/08/2018   Paresthesia 01/08/2018   Neck pain 08/31/2017   Angioedema 06/15/2017   External hemorrhoids    Diverticulosis of large intestine without diverticulitis    Internal hemorrhoids    Advance care planning 12/23/2016   Renal stone 04/20/2016   Heartburn 12/17/2015   Medicare annual wellness visit, subsequent 06/05/2013   Hyperglycemia 06/05/2013   Knee pain 08/31/2012   Age-related osteoporosis without current pathological fracture 08/18/2012   HTN  (hypertension) 08/09/2012   HLD (hyperlipidemia) 08/09/2012    PCP: Cleatus Molly MD   REFERRING PROVIDER: Addie Cordella Hamilton, MD  REFERRING DIAG: Diagnosis [phone] (ICD-10-CM) - Chronic pain of left knee M17.12 (ICD-10-CM) - Unilateral primary osteoarthritis, left knee  THERAPY DIAG:  Stiffness of left knee, not elsewhere classified  Chronic pain of left knee  Difficulty in walking, not elsewhere classified  Muscle weakness (generalized)  Other abnormalities of gait and mobility  Localized edema  Unsteadiness on feet  Rationale for Evaluation and Treatment: Rehabilitation  ONSET DATE: Lt TKR 07/26/23  SUBJECTIVE:   SUBJECTIVE STATEMENT:   Pt indicated no pain upon arrival today.  Reported leg press seems to take a few days to recover from(muscle and knee).    PERTINENT HISTORY: Left TKA 07/26/2023, OA, osteoporosis, heart murmur, HTN  PAIN:  NPRS scale: 0/10 Pain location: Lt knee  Pain description:  stiffness, tightness.  Aggravating factors: bending it  Relieving factors: nothing besides pain pills   PRECAUTIONS: None  RED FLAGS: None   WEIGHT BEARING RESTRICTIONS: No  FALLS:  Has patient fallen in last 6 months? No  LIVING ENVIRONMENT: Lives with: lives with their family Lives in: House/apartment Stairs:  flight inside but does not have to go up  Has following equipment at home: Vannie - 2 wheeled  OCCUPATION: retiredwater engineer at an [employer]   PLOF: Independent, Independent with basic ADLs, Independent with gait, and Independent with transfers  PATIENT GOALS: be able to line dance   NEXT MD VISIT: Referring 09/23/2023  OBJECTIVE:  Note: Objective measures were completed at Evaluation unless otherwise noted.  PATIENT SURVEYS:  PSFS: THE PATIENT SPECIFIC FUNCTIONAL SCALE  Place score of 0-10 (0 = unable to perform activity and 10 = able to perform activity at the same level as before injury or problem)  Activity Date: 08/12/23  09/14/23 09/28/2023  11/09/2023   Bending knee  5 1 5 9   2. Lifting leg  5 1 6 8   3.walking  2 8 10   4. Standing for ADLs  2 6 10   Total Score 5 1.5 6.25 9.25 avg    Total Score = Sum of activity scores/number of activities  Minimally Detectable Change: 3 points (for single activity); 2 points (for average score)   COGNITION: 08/12/2023 Overall cognitive status: Within functional limits for tasks assessed     SENSATION: 08/12/2023 [facility]  EDEMA:  08/12/2023 Localized edema appropriate for post-op state    LOWER EXTREMITY ROM:  Active ROM Left eval Left  08/22/23 Left  08/24/23 Left 09/01/23 Left  09/14/23 Left 09/20/23 Left 09/28/2023 Left 10/18/2023 Left  10/20/23 Left  10/26/23 Left 11/09/2023  Hip flexion             Hip extension             Hip abduction  Hip adduction             Hip internal rotation             Hip external rotation             Knee flexion 67 64* 73* 62 in supine AROM heel slide  72 in PROM heel slide Supine 90/90 position A: 68* Supine A: 60 P: 65 Sitting:  P: 70 Supine AROM heel slide: 71  Supine PROM: 74 Supine AROM heel slide: 82  Supine passive range heel slide: 86   Supine  A 80 Supine  A 85 P 92 Supine AROM heel slide: 86  PROM 90    Knee extension -9 -5* -5* -8 in seated AROM LAQ  Supine -9* Supine P: -5 Seated -6 AROM in heel prop      Ankle dorsiflexion             Ankle plantarflexion             Ankle inversion             Ankle eversion              (Blank rows = not tested)  LOWER EXTREMITY MMT:  MMT Left Eval 08/12/2023 Left 09/14/23 Left 09/28/2023 Right 10/18/2023 Left 10/18/2023 Left 11/09/2023  Hip flexion   5/5     Hip extension        Hip abduction        Hip adduction        Hip internal rotation        Hip external rotation        Knee flexion 3+ 3-/5 4+/5     Knee extension 3+ 3-/5 4/5 5/5 37, 37.3 lbs 4/5 22, 20.6 lbs 5/5 30, 31.7 lbs  Ankle dorsiflexion   5/5     Ankle  plantarflexion        Ankle inversion        Ankle eversion         (Blank rows = not tested)   GAIT: 11/09/2023: Ambulation in clinic distances independent, does carry [facility].  09/28/2023: Ambulation with SPC in Rt UE with some quality improvements in knee flexion in swing.  Lacking TKE still noted in stance.   08/30/2023: Ambulation in clinic c SPC in Rt UE with SBA.  Reduced knee extension in stance, maintaining knee flexion.   08/12/2023 Distance walked: in clinic distances  Assistive device utilized: Environmental Consultant - 2 wheeled Level of assistance: Modified independence Comments: antalgic, limited knee ROM during gait cycle                  TREATMENT      DATE:  11/09/2023 Therex: UBE LE only lvl 3.0 10 mins for ROM, endurance Incline gastroc stretch 30 sec x 3 bilateral  Supine Lt leg AROM heel slide 5 sec hold x 10 Supine bridge 3 x 5 with Lt leg walk up into flexion after each set.  Seated Lt  leg LAQ with end range pauses each direction with contralateral leg movement opposite  2 x 15 5 lb weight   Discussed HEP use and importance of continued mobility in at home activity.   TherActivity Flight of stairs down with partial flight reciprocally and partial with focus on WB on Rt leg for safety.  Hand rail use bialterally   Manual Supine Lt knee flexion c distraction/IR mobilization with movement to promote knee flexion mobility.    TREATMENT  DATE: 11/02/23 Therex: Sci Fit UE/LE for knee ROM seat 5 8 min Seated Lt knee LAQ 7 lbs 2 x 15 , performed bilaterally  Incline gastroc stretch 30 sec x 3 bilateral  Squats 2x10  Prone lie for extension 3 min  There act for transfers and stairs Leg press 81# 3x10  L LE #50 3x10 Step ups 6 step Lean ins on 6 step. Lateral lifts 10x Neuro  Backward weight transfer for ext  2x10 Tandem stance 325 sec 2x each foot Vaso:  Lt LE elevated on wedge with medium compression for 10 minutes at 34deg    TREATMENT      DATE:  10/31/23 Therex: Sci Fit UE/LE for knee ROM seat 5 then 4   (5 mins of each) Seated Lt knee LAQ 7 lbs 2 x 15 , performed bilaterally  Incline gastroc stretch 30 sec x 3 bilateral  Squats 2x10   There act for transfers and stairs Leg press 75# 3x10  L LE #50 3x10 Step ups 6 step Lean ins on 8 step. Lateral lifts 10x  Manual Supine Lt knee extension Vaso:  Lt LE elevated on wedge with medium compression for 10 minutes at 34deg   PATIENT EDUCATION:  Education details: HEP review.  Person educated: Patient Education method: Medical Illustrator Education comprehension: verbalized understanding, returned demonstration, and needs further education  HOME EXERCISE PROGRAM: Access Code: [email or web address] URL: [email or web address] Date: 09/22/2023 Prepared by: Ismael Theophilus Stallion Access Code: [email or web address] URL: [email or web address] Date: 09/26/2023 Prepared by: Burnard Meth  Exercises - Supine Heel Slide with Strap  - 5 x daily - 7 x weekly - 1 sets - 10 reps - 10 seconds hold - Supine Knee Extension Stretch on Towel Roll  - 1 x daily - 7 x weekly - 3 sets - 10 reps - Seated Knee Flexion Extension AROM   - 1 x daily - 7 x weekly - 3 sets - 10 reps - Seated Knee Flexion AAROM  - 1 x daily - 7 x weekly - 3 sets - 10 reps - Supine Short Arc Quad  - 1 x daily - 7 x weekly - 3 sets - 10 reps - Standing Terminal Knee Extension with Resistance  - 1 x daily - 7 x weekly - 2-3 sets - 10 reps - 5 seconds hold - Mini Squat with Counter Support  - 2 x daily - 7 x weekly - 2 sets - 10 reps - 5 hold  ASSESSMENT:  CLINICAL IMPRESSION:  The patient has attended 26 visits over the course of treatment cycle.   See objective data above for updated information regarding current presentation.  Discussion with patient today about continued importance of HEP going forward.  Due to overall presentation and time past since procedures, it was appropriate to discuss HEP  transitioning plans.  Pt seemed confident in functional activity (see PSFS improvements).  Pt to return tomorrow for HEP review/prep for HEP transitioning/discharge.     OBJECTIVE IMPAIRMENTS: Abnormal gait, decreased activity tolerance, decreased balance, decreased knowledge of use of DME, decreased mobility, difficulty walking, decreased ROM, decreased strength, hypomobility, increased muscle spasms, impaired flexibility, and pain.   ACTIVITY LIMITATIONS: sitting, standing, squatting, sleeping, stairs, transfers, bed mobility, and locomotion level  PARTICIPATION LIMITATIONS: driving, shopping, community activity, yard work, and church  PERSONAL FACTORS: Age, Behavior pattern, Education, Fitness, Past/current experiences, Social background, and Time since onset of injury/illness/exacerbation are also affecting patient's functional outcome.   REHAB POTENTIAL: Good  CLINICAL DECISION MAKING: Stable/uncomplicated  EVALUATION COMPLEXITY: Low   GOALS: Goals reviewed with patient? No  SHORT TERM GOALS: Target date: 10/07/2023   Will be compliant with appropriate progressive HEP Goal status: Met  2. Lt knee ROM 5-85* Goal status: partially met  3. Will be independent with edema management strategies  Goal status: mostly met   LONG TERM GOALS: Target date: 11/11/2023   Patient will demonstrate Lt knee MMT 5/5 WFL throughout to faciltiate usual transfers, stairs, squatting at PLOF for daily life.  Goal status: met 11/09/2023  2. L Knee flexion AROM -5 to 90* Goal status: on going 11/09/2023  3. Will be able to ascend and descend stairs reciprocally with no increase in pain Goal status: partially met 11/09/2023  4. Will be able to ambulate community distances and perform all household tasks with no increase in pain w/ LRAD  Goal status: Met 11/09/2023  5.  Patient will demonstrate Patient specific functional scale >5 to indicate reduced disability due to condition.  Goal  status: Met 11/09/2023  6. Lt Knee flexion PROM -2 to 100* Goal status: on going 11/09/2023   PLAN:  PT FREQUENCY:  4-5x/week for 2 weeks then 2-3x/week for 6 weeks  PT DURATION: 8 weeks  PLANNED INTERVENTIONS: 97750- Physical Performance Testing, 97110-Therapeutic exercises, 97530- Therapeutic activity, 97112- Neuromuscular re-education, 97535- Self Care, [id number]- Manual therapy, [id number]- Gait training, and [id number]- Vasopneumatic device  PLAN FOR NEXT SESSION: HEP transitioning plan probable discharge.  Next visit is last visit scheduled prior to MD visit next week.    Work stairs with WB on Lt.    Ozell Silvan, PT, DPT, OCS, ATC 11/09/23  2:20 PM  PHYSICAL THERAPY DISCHARGE SUMMARY  Visits from Start of Care: 26  Current functional level related to goals / functional outcomes: See note   Remaining deficits: See note   Education / Equipment: HEP  Patient goals were partially met. Patient is being discharged due to not returning since the last visit.  Ozell Silvan, PT, DPT, OCS, ATC 02/15/24  11:31 AM     "

## 2023-11-09 NOTE — Therapy (Incomplete)
 OUTPATIENT PHYSICAL THERAPY TREATMENT  Patient Name: Patricia Hoover MRN: [id number] DOB:11/24/1945, 78 y.o., female Today's Date: 11/09/2023     END OF SESSION:RECERT/Progress           Past Medical History:  Diagnosis Date   Angioedema 06/15/2017   Arrhythmia    possible hx, resolved prev   Blood transfusion without reported diagnosis 1972   had reaction; had to stop   Carpal tunnel syndrome    Cataract 2019   resolved with surgery   Complication of anesthesia    takes a long to wake up   Duodenal ulcer    H/O exercise stress test 2012   normal   Heart murmur    as child   Hematochezia    High cholesterol    History of kidney stones    Hx of colonic polyps    Hypertension    Osteoporosis 2010   t score - 3.9   Peptic ulcer of duodenum    Renal stones    Vitamin D  deficiency 03/01/2021   Past Surgical History:  Procedure Laterality Date   ABDOMINAL HYSTERECTOMY  1972   for endometriosis   CATARACT EXTRACTION W/ INTRAOCULAR LENS IMPLANT Right 06/2017   CATARACT EXTRACTION W/ INTRAOCULAR LENS IMPLANT Left 07/2017   COLONOSCOPY Left 03/02/2017   Procedure: COLONOSCOPY;  Surgeon: Janalyn Keene NOVAK, MD;  Location: [facility] ENDOSCOPY;  Service: Endoscopy;  Laterality: Left;   COLONOSCOPY WITH PROPOFOL  N/A 05/04/2016   Procedure: COLONOSCOPY WITH PROPOFOL ;  Surgeon: Ruel Kung, MD;  Location: [facility] ENDOSCOPY;  Service: Endoscopy;  Laterality: N/A;   ESOPHAGOGASTRODUODENOSCOPY Left 03/02/2017   Procedure: ESOPHAGOGASTRODUODENOSCOPY (EGD);  Surgeon: Janalyn Keene NOVAK, MD;  Location: [facility] ENDOSCOPY;  Service: Endoscopy;  Laterality: Left;   ESOPHAGOGASTRODUODENOSCOPY (EGD) WITH PROPOFOL  N/A 04/08/2017   Procedure: ESOPHAGOGASTRODUODENOSCOPY (EGD) WITH PROPOFOL ;  Surgeon: Janalyn Keene NOVAK, MD;  Location: [facility] ENDOSCOPY;  Service: Endoscopy;  Laterality: N/A;   EXAM UNDER ANESTHESIA WITH MANIPULATION OF KNEE Left 09/13/2023   Procedure: MANIPULATION, JOINT, KNEE, WITH  ANESTHESIA;  Surgeon: Addie Cordella Hamilton, MD;  Location: [facility] OR;  Service: Orthopedics;  Laterality: Left;   LITHOTRIPSY     for kidney stone x5   LITHOTRIPSY  09/2017   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   Procedure: ARTHROPLASTY, KNEE, TOTAL, LEFT;  Surgeon: Addie Cordella Hamilton, MD;  Location: [facility] OR;  Service: Orthopedics;  Laterality: Left;   Patient Active Problem List   Diagnosis Date Noted   Arthrofibrosis of knee joint, left 09/18/2023   Arthritis of left knee 07/26/2023   Lower GI bleeding 06/29/2023   Aortic atherosclerosis 03/09/2023   Unilateral primary osteoarthritis, left knee 11/26/2022   Dysuria 04/21/2022   Stiff-legged gait 04/21/2022   Scalp lesion 09/21/2021   Clavicle enlargement 04/05/2021   Hyperpigmentation 03/01/2021   Vitamin D  deficiency 03/01/2021   Leg pain 05/21/2019   Right leg pain 05/21/2019   GI bleed 03/04/2018   Healthcare maintenance 01/08/2018   Paresthesia 01/08/2018   Neck pain 08/31/2017   Angioedema 06/15/2017   External hemorrhoids    Diverticulosis of large intestine without diverticulitis    Internal hemorrhoids    Advance care planning 12/23/2016   Renal stone 04/20/2016   Heartburn 12/17/2015   Medicare annual wellness visit, subsequent 06/05/2013   Hyperglycemia 06/05/2013   Knee pain 08/31/2012   Age-related osteoporosis without current pathological fracture 08/18/2012   HTN (hypertension) 08/09/2012   HLD (hyperlipidemia) 08/09/2012    PCP: Cleatus Molly MD   REFERRING PROVIDER: Addie Cordella  Glendia, MD  REFERRING DIAG: Diagnosis [phone] (ICD-10-CM) - Chronic pain of left knee M17.12 (ICD-10-CM) - Unilateral primary osteoarthritis, left knee  THERAPY DIAG:  No diagnosis found.  Rationale for Evaluation and Treatment: Rehabilitation  ONSET DATE: Lt TKR 07/26/23  SUBJECTIVE:   SUBJECTIVE STATEMENT:   ***Pt reports still stiff in/out of a car.   PERTINENT HISTORY: Left TKA 07/26/2023, OA, osteoporosis,  heart murmur, HTN  PAIN:  ***NPRS scale:2/10 Pain location: Lt knee  Pain description:  stiffness, tightness.  Aggravating factors: bending it  Relieving factors: nothing besides pain pills   PRECAUTIONS: None  RED FLAGS: None   WEIGHT BEARING RESTRICTIONS: No  FALLS:  Has patient fallen in last 6 months? No  LIVING ENVIRONMENT: Lives with: lives with their family Lives in: House/apartment Stairs:  flight inside but does not have to go up  Has following equipment at home: Vannie - 2 wheeled  OCCUPATION: retiredWater engineer at an [employer]   PLOF: Independent, Independent with basic ADLs, Independent with gait, and Independent with transfers  PATIENT GOALS: be able to line dance   NEXT MD VISIT: Referring 09/23/2023  OBJECTIVE:  Note: Objective measures were completed at Evaluation unless otherwise noted.  PATIENT SURVEYS:  PSFS: THE PATIENT SPECIFIC FUNCTIONAL SCALE  Place score of 0-10 (0 = unable to perform activity and 10 = able to perform activity at the same level as before injury or problem)  Activity Date: 08/12/23 09/14/23 09/28/2023    Bending knee  5 1 5   2. Lifting leg  5 1 6   3.walking  2 8  4. Standing for ADLs  2 6  Total Score 5 1.5 6.25    Total Score = Sum of activity scores/number of activities  Minimally Detectable Change: 3 points (for single activity); 2 points (for average score)   COGNITION: 08/12/2023 Overall cognitive status: Within functional limits for tasks assessed    3 SENSATION: 08/12/2023 [facility]  EDEMA:  08/12/2023 Localized edema appropriate for post-op state    LOWER EXTREMITY ROM:  Active ROM Left eval Left  08/22/23 Left  08/24/23 Left 09/01/23 Left  09/14/23 Left 09/20/23 Left 09/28/2023 Left 10/18/2023 Left  10/20/23 Left  10/26/23  Hip flexion            Hip extension            Hip abduction            Hip adduction            Hip internal rotation            Hip external rotation            Knee flexion 67  64* 73* 62 in supine AROM heel slide  72 in PROM heel slide Supine 90/90 position A: 68* Supine A: 60 P: 65 Sitting:  P: 70 Supine AROM heel slide: 71  Supine PROM: 74 Supine AROM heel slide: 82  Supine passive range heel slide: 86   Supine  A 80 Supine  A 85 P 92  Knee extension -9 -5* -5* -8 in seated AROM LAQ  Supine -9* Supine P: -5 Seated -6 AROM in heel prop     Ankle dorsiflexion            Ankle plantarflexion            Ankle inversion            Ankle eversion             (  Blank rows = not tested)  LOWER EXTREMITY MMT:  MMT Left Eval 08/12/2023 Left 09/14/23 Left 09/28/2023 Right 10/18/2023 Left 10/18/2023  Hip flexion   5/5    Hip extension       Hip abduction       Hip adduction       Hip internal rotation       Hip external rotation       Knee flexion 3+ 3-/5 4+/5    Knee extension 3+ 3-/5 4/5 5/5 37, 37.3 lbs 4/5 22, 20.6 lbs  Ankle dorsiflexion   5/5    Ankle plantarflexion       Ankle inversion       Ankle eversion        (Blank rows = not tested)    GAIT: 09/28/2023: Ambulation with SPC in Rt UE with some quality improvements in knee flexion in swing.  Lacking TKE still noted in stance.   08/30/2023: Ambulation in clinic c SPC in Rt UE with SBA.  Reduced knee extension in stance, maintaining knee flexion.   08/12/2023 Distance walked: in clinic distances  Assistive device utilized: Vannie - 2 wheeled Level of assistance: Modified independence Comments: antalgic, limited knee ROM during gait cycle                  TREATMENT  L knee      11/10/23***     11/02/23 Therex: Sci Fit UE/LE for knee ROM seat 5 8 min Seated Lt knee LAQ 7 lbs 2 x 15 , performed bilaterally  Incline gastroc stretch 30 sec x 3 bilateral  Squats 2x10  Prone lie for extension 3 min  There act for transfers and stairs Leg press 81# 3x10  L LE #50 3x10 Step ups 6 step Lean ins on 6 step. Lateral lifts 10x Neuro  Backward weight transfer for ext   2x10 Tandem stance 325 sec 2x each foot Vaso:  Lt LE elevated on wedge with medium compression for 10 minutes at 34deg    10/31/23 Therex: Sci Fit UE/LE for knee ROM seat 5 then 4   (5 mins of each) Seated Lt knee LAQ 7 lbs 2 x 15 , performed bilaterally  Incline gastroc stretch 30 sec x 3 bilateral  Squats 2x10   There act for transfers and stairs Leg press 75# 3x10  L LE #50 3x10 Step ups 6 step Lean ins on 8 step. Lateral lifts 10x  Manual Supine Lt knee extension Vaso:  Lt LE elevated on wedge with medium compression for 10 minutes at 34deg       10/26/23 Therex: Nustep level 3 7 min Seated Lt knee LAQ 7 lbs 2 x 15 , performed bilaterally  Supine bridge 2 x 5 with heel slide walk up after each set for flexion gains.  Incline gastroc stretch 30 sec x 3 bilateral  Squats 2x10   There act for transfers and stairs Leg press 75# 3x10  L LE #50 3x10 Step ups 6 step Lean ins on 6 step. Lateral lifts  Manual Seated Lt knee flexion c distraction/IR mobilization c movement.  Passive flexion. Vaso:  Lt LE elevated on wedge with medium compression for 10 minutes at 34deg   10/24/23 Therex: UBE UE/LE for knee ROM seat 5 then 4   (5 mins of each) Seated Lt knee LAQ 7 lbs 2 x 15 , performed bilaterally  Supine bridge 2 x 5 with heel slide walk up after each set for flexion gains.  Incline gastroc stretch 30 sec x 3 bilateral  Squats 2x10  Incline board stretch There act for transfers and stairs Step ups 6 step Lean ins on 6 step. Lateral lifts  Manual Seated Lt knee flexion c distraction/IR mobilization c movement.  Passive flexion. Vaso:  Lt LE elevated on wedge with medium compression for 10 minutes at 34deg     10/20/23 Therex: UBE UE/LE for knee ROM seat 5 partial circles 5 second stretching to full revolutions 10 mins  (5 mins of each) Seated Lt knee LAQ 7 lbs 2 x 15 , performed bilaterally  Supine bridge 2 x 5 with heel slide walk up after  each set for flexion gains.  Incline gastroc stretch 30 sec x 3 bilateral  Squats 2x10  Incline board stretch Manual Seated Lt knee flexion c distraction/IR mobilization c movement.  Passive flexion. Vaso:  Lt LE elevated on wedge with medium compression for 10 minutes at 34deg     DATE: 10/18/2023 Therex: UBE UE/LE for knee ROM seat 5 partial circles 5 second stretching to full revolutions 10 mins  (5 mins of each) Seated Lt knee LAQ 7 lbs 2 x 15 , performed bilaterally  Seated isometric Lt leg extension 5 sec hold x 10 with contralateral leg pushing back( cues for home use) Supine Lt knee flexion/extension end range stretching 5 sec holds x 10 in 90 deg hip flexion Supine bridge 2 x 5 with heel slide walk up after each set for flexion gains.  Incline gastroc stretch 30 sec x 3 bilateral   Manual Seated Lt knee flexion c distraction/IR mobilization c movement.  Contract relax for extension and flexion gains.    TREATMENT       DATE: 09/30/23  Therex: Recumbent bike, level 1, seat 2 for 9 minutes Glute bridge MWM  Neuro Re-Ed (for muscle activation) Quad set: opposite leg, Lt LE, both LE, 3x each for 3 sets  Bilateral SAQ, 2x8  Manual Grade 3/4 mobs for knee extension  Contract, relax technique for knee flexion   Vaso:  Lt LE elevated on wedge with medium compression for 10 minutes at 34deg  TREATMENT       DATE: 09/29/23 Therex: UBE UE/LE for knee ROM to tolerance partial circles mainly with full mixed in around 6 mins in - 8 mins total , seat 5 Supine knee extension with heel prop and 4# weight pulling knee into extension 3 min SLR 20x Seated Lt leg LAQ 3 x 10 for ROM  TherActivity: Mini squats with UE support  Sit to stands  Manual: Passive extension  PATIENT EDUCATION:  Education details: exam findings, POC, course of care following TKR  Person educated: Patient Education method: Explanation and Demonstration Education comprehension: verbalized  understanding, returned demonstration, and needs further education  HOME EXERCISE PROGRAM: Access Code: [email or web address] URL: [email or web address] Date: 09/22/2023 Prepared by: Ismael Theophilus Stallion Access Code: [email or web address] URL: [email or web address] Date: 09/26/2023 Prepared by: Burnard Meth  Exercises - Supine Heel Slide with Strap  - 5 x daily - 7 x weekly - 1 sets - 10 reps - 10 seconds hold - Supine Knee Extension Stretch on Towel Roll  - 1 x daily - 7 x weekly - 3 sets - 10 reps - Seated Knee Flexion Extension AROM   - 1 x daily - 7 x weekly - 3 sets - 10 reps - Seated Knee Flexion AAROM  - 1 x daily - 7 x weekly - 3 sets - 10 reps - Supine Short  Arc Quad  - 1 x daily - 7 x weekly - 3 sets - 10 reps - Standing Terminal Knee Extension with Resistance  - 1 x daily - 7 x weekly - 2-3 sets - 10 reps - 5 seconds hold - Mini Squat with Counter Support  - 2 x daily - 7 x weekly - 2 sets - 10 reps - 5 hold  ASSESSMENT:  CLINICAL IMPRESSION: ***VC needed to emphasize extension with backward ambulation/weight shift.  OBJECTIVE IMPAIRMENTS: Abnormal gait, decreased activity tolerance, decreased balance, decreased knowledge of use of DME, decreased mobility, difficulty walking, decreased ROM, decreased strength, hypomobility, increased muscle spasms, impaired flexibility, and pain.   ACTIVITY LIMITATIONS: sitting, standing, squatting, sleeping, stairs, transfers, bed mobility, and locomotion level  PARTICIPATION LIMITATIONS: driving, shopping, community activity, yard work, and church  PERSONAL FACTORS: Age, Behavior pattern, Education, Fitness, Past/current experiences, Social background, and Time since onset of injury/illness/exacerbation are also affecting patient's functional outcome.   REHAB POTENTIAL: Good  CLINICAL DECISION MAKING: Stable/uncomplicated  EVALUATION COMPLEXITY: Low   GOALS: Goals reviewed with patient? No  SHORT TERM GOALS: Target  date: 10/07/2023   Will be compliant with appropriate progressive HEP Goal status: Met  2. Lt knee ROM 5-85* Goal status: partially met  3. Will be independent with edema management strategies  Goal status: mostly met   LONG TERM GOALS: Target date: 11/11/2023***   Patient will demonstrate Lt knee MMT WFL throughout to faciltiate usual transfers, stairs, squatting at PLOF for daily life.  Goal status: updated, 09/28/2023  2. L Knee flexion AROM -5 to 90* Goal status: updated, 09/28/2023  3. Will be able to ascend and descend stairs reciprocally with no increase in pain Goal status: updated, 09/28/2023  4. Will be able to ambulate community distances and perform all household tasks with no increase in pain w/ LRAD  Goal status: updated, 09/28/2023  5.  Patient will demonstrate Patient specific functional scale >5 to indicate reduced disability due to condition.  Goal status: updated, 09/28/2023  6. Lt Knee flexion PROM -2 to 100* Goal status: updated, 09/28/2023   PLAN:  PT FREQUENCY:  4-5x/week for 2 weeks then 2-3x/week for 6 weeks  PT DURATION: 8 weeks  PLANNED INTERVENTIONS: 97750- Physical Performance Testing, 97110-Therapeutic exercises, 97530- Therapeutic activity, 97112- Neuromuscular re-education, 97535- Self Care, 02859- Manual therapy, 97116- Gait training, and 97016- Vasopneumatic device  PLAN FOR NEXT SESSION: ***Needs continued strengthening (OKC, CKC ) while working on motion gains.    Burnard Meth, PT 11/09/23  7:51 AM

## 2023-11-10 ENCOUNTER — Encounter

## 2023-11-14 ENCOUNTER — Ambulatory Visit (INDEPENDENT_AMBULATORY_CARE_PROVIDER_SITE_OTHER): Admitting: Orthopedic Surgery

## 2023-11-14 ENCOUNTER — Encounter: Admitting: Rehabilitative and Restorative Service Providers"

## 2023-11-14 DIAGNOSIS — Z96652 Presence of left artificial knee joint: Secondary | ICD-10-CM

## 2023-11-15 ENCOUNTER — Ambulatory Visit (HOSPITAL_BASED_OUTPATIENT_CLINIC_OR_DEPARTMENT_OTHER): Admitting: Radiology

## 2023-11-15 ENCOUNTER — Other Ambulatory Visit (HOSPITAL_BASED_OUTPATIENT_CLINIC_OR_DEPARTMENT_OTHER)

## 2023-11-15 ENCOUNTER — Encounter: Payer: Self-pay | Admitting: Orthopedic Surgery

## 2023-11-15 NOTE — Progress Notes (Unsigned)
 Post-Op Visit Note   Patient: Patricia Hoover           Date of Birth: November 01, 1945           MRN: [id number] Visit Date: 11/14/2023 PCP: Cleatus Arlyss RAMAN, MD   Assessment & Plan:  Chief Complaint:  Chief Complaint  Patient presents with   Left Knee - Follow-up     LEFT MUA (surgery date 09-13-23)     Visit Diagnoses:  1. Status post total left knee replacement     Plan: Queen underwent left knee manipulation under anesthesia 09/13/2023.  She is about 2 months out from that.  She is able to do the stairs.  She is walking normal but it is not coming actually for.  She does use a cane on occasion.  On exam range of motion is about 20 to 80 degrees.  Passively I can get her to 90.  Patella mobility slightly better than prior to surgery.  Plan is to continue to work on achieving full extension.  Continue with stairs.  Overall she is happy with where her knee is right now.  I think she could get a little bit more motion with continued effort.  She will follow-up with us  as needed.  Follow-Up Instructions: No follow-ups on file.   Orders:  No orders of the defined types were placed in this encounter.  No orders of the defined types were placed in this encounter.   Imaging: No results found.  PMFS History: Patient Active Problem List   Diagnosis Date Noted   Arthrofibrosis of knee joint, left 09/18/2023   Arthritis of left knee 07/26/2023   Lower GI bleeding 06/29/2023   Aortic atherosclerosis 03/09/2023   Unilateral primary osteoarthritis, left knee 11/26/2022   Dysuria 04/21/2022   Stiff-legged gait 04/21/2022   Scalp lesion 09/21/2021   Clavicle enlargement 04/05/2021   Hyperpigmentation 03/01/2021   Vitamin D  deficiency 03/01/2021   Leg pain 05/21/2019   Right leg pain 05/21/2019   GI bleed 03/04/2018   Healthcare maintenance 01/08/2018   Paresthesia 01/08/2018   Neck pain 08/31/2017   Angioedema 06/15/2017   External hemorrhoids    Diverticulosis of large intestine  without diverticulitis    Internal hemorrhoids    Advance care planning 12/23/2016   Renal stone 04/20/2016   Heartburn 12/17/2015   Medicare annual wellness visit, subsequent 06/05/2013   Hyperglycemia 06/05/2013   Knee pain 08/31/2012   Age-related osteoporosis without current pathological fracture 08/18/2012   HTN (hypertension) 08/09/2012   HLD (hyperlipidemia) 08/09/2012   Past Medical History:  Diagnosis Date   Angioedema 06/15/2017   Arrhythmia    possible hx, resolved prev   Blood transfusion without reported diagnosis 1972   had reaction; had to stop   Carpal tunnel syndrome    Cataract 2019   resolved with surgery   Complication of anesthesia    takes a long to wake up   Duodenal ulcer    H/O exercise stress test 2012   normal   Heart murmur    as child   Hematochezia    High cholesterol    History of kidney stones    Hx of colonic polyps    Hypertension    Osteoporosis 2010   t score - 3.9   Peptic ulcer of duodenum    Renal stones    Vitamin D  deficiency 03/01/2021    Family History  Problem Relation Age of Onset   Heart disease Mother    Renal  Disease Mother    Heart failure Mother    Colon cancer Neg Hx    Breast cancer Neg Hx     Past Surgical History:  Procedure Laterality Date   ABDOMINAL HYSTERECTOMY  1972   for endometriosis   CATARACT EXTRACTION W/ INTRAOCULAR LENS IMPLANT Right 06/2017   CATARACT EXTRACTION W/ INTRAOCULAR LENS IMPLANT Left 07/2017   COLONOSCOPY Left 03/02/2017   Procedure: COLONOSCOPY;  Surgeon: Janalyn Keene NOVAK, MD;  Location: [facility] ENDOSCOPY;  Service: Endoscopy;  Laterality: Left;   COLONOSCOPY WITH PROPOFOL  N/A 05/04/2016   Procedure: COLONOSCOPY WITH PROPOFOL ;  Surgeon: Ruel Kung, MD;  Location: [facility] ENDOSCOPY;  Service: Endoscopy;  Laterality: N/A;   ESOPHAGOGASTRODUODENOSCOPY Left 03/02/2017   Procedure: ESOPHAGOGASTRODUODENOSCOPY (EGD);  Surgeon: Janalyn Keene NOVAK, MD;  Location: [facility] ENDOSCOPY;  Service:  Endoscopy;  Laterality: Left;   ESOPHAGOGASTRODUODENOSCOPY (EGD) WITH PROPOFOL  N/A 04/08/2017   Procedure: ESOPHAGOGASTRODUODENOSCOPY (EGD) WITH PROPOFOL ;  Surgeon: Janalyn Keene NOVAK, MD;  Location: [facility] ENDOSCOPY;  Service: Endoscopy;  Laterality: N/A;   EXAM UNDER ANESTHESIA WITH MANIPULATION OF KNEE Left 09/13/2023   Procedure: MANIPULATION, JOINT, KNEE, WITH ANESTHESIA;  Surgeon: Addie Cordella Hamilton, MD;  Location: [facility] OR;  Service: Orthopedics;  Laterality: Left;   LITHOTRIPSY     for kidney stone x5   LITHOTRIPSY  09/2017   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   Procedure: ARTHROPLASTY, KNEE, TOTAL, LEFT;  Surgeon: Addie Cordella Hamilton, MD;  Location: [facility] OR;  Service: Orthopedics;  Laterality: Left;   Social History   Occupational History   Occupation: Retired    Comment: Product Manager  Tobacco Use   Smoking status: Former    Current packs/day: 0.00    Types: Cigarettes    Quit date: 01/19/1991    Years since quitting: 32.8   Smokeless tobacco: Never  Vaping Use   Vaping status: Never Used  Substance and Sexual Activity   Alcohol use: No    Comment: rare wine   Drug use: No   Sexual activity: Not on file

## 2023-11-16 ENCOUNTER — Encounter

## 2023-11-18 DIAGNOSIS — Z23 Encounter for immunization: Secondary | ICD-10-CM | POA: Diagnosis not present

## 2023-11-21 ENCOUNTER — Encounter: Payer: Self-pay | Admitting: Radiology

## 2023-11-22 ENCOUNTER — Encounter

## 2023-11-24 ENCOUNTER — Encounter

## 2023-11-29 ENCOUNTER — Encounter

## 2023-12-01 ENCOUNTER — Encounter

## 2024-01-02 DIAGNOSIS — Z23 Encounter for immunization: Secondary | ICD-10-CM | POA: Diagnosis not present

## 2024-01-23 ENCOUNTER — Telehealth: Payer: Self-pay

## 2024-01-23 NOTE — Telephone Encounter (Signed)
 Copied from CRM [id number]. Topic: Appointments - Scheduling Inquiry for Clinic >> Jan 23, 2024 12:14 PM Patricia Hoover wrote: Reason for CRM: Patient wanted to schedule a physical with Dr.Duncan but it would not give me the option.It kept trying to schedule her for AWV 1.Could someone please call patient and schedule.   Thank you.

## 2024-01-31 ENCOUNTER — Other Ambulatory Visit: Payer: Self-pay | Admitting: Family Medicine

## 2024-01-31 DIAGNOSIS — E785 Hyperlipidemia, unspecified: Secondary | ICD-10-CM

## 2024-01-31 DIAGNOSIS — I10 Essential (primary) hypertension: Secondary | ICD-10-CM

## 2024-01-31 NOTE — Telephone Encounter (Unsigned)
 Copied from CRM [id number]. Topic: Clinical - Medication Refill >> Jan 31, 2024  9:24 AM Amy B wrote: Medication: amLODipine  (NORVASC ) 2.5 MG tablet simvastatin  (ZOCOR ) 10 MG tablet indapamide  (LOZOL ) 2.5 MG tablet  Has the patient contacted their pharmacy? Yes (Agent: If no, request that the patient contact the pharmacy for the refill. If patient does not wish to contact the pharmacy document the reason why and proceed with request.) (Agent: If yes, when and what did the pharmacy advise?)  This is the patient's preferred pharmacy:   [facility] [place], [place] - [address] [place] [address] Phone: [phone] Fax: [phone]  Is this the correct pharmacy for this prescription? Yes If no, delete pharmacy and type the correct one.   Has the prescription been filled recently? No  Is the patient out of the medication? No  Has the patient been seen for an appointment in the last year OR does the patient have an upcoming appointment? Yes  Can we respond through MyChart? Patient prefers phone call  Agent: Please be advised that Rx refills may take up to 3 business days. We ask that you follow-up with your pharmacy.

## 2024-02-01 MED ORDER — SIMVASTATIN 10 MG PO TABS: 10.0000 mg | ORAL_TABLET | ORAL | 0 refills | Status: AC

## 2024-02-01 MED ORDER — AMLODIPINE BESYLATE 2.5 MG PO TABS: 2.5000 mg | ORAL_TABLET | Freq: Every day | ORAL | 0 refills | Status: AC

## 2024-02-01 MED ORDER — INDAPAMIDE 2.5 MG PO TABS: 2.5000 mg | ORAL_TABLET | Freq: Every day | ORAL | 0 refills | Status: AC

## 2024-02-15 ENCOUNTER — Encounter: Payer: Self-pay | Admitting: Family Medicine

## 2024-02-27 ENCOUNTER — Other Ambulatory Visit

## 2024-03-05 ENCOUNTER — Ambulatory Visit: Admitting: Family Medicine

## 2024-03-15 ENCOUNTER — Ambulatory Visit: Payer: Medicare Other

## 2024-03-16 ENCOUNTER — Ambulatory Visit: Admitting: Family Medicine

## 2024-03-23 ENCOUNTER — Ambulatory Visit
